# Patient Record
Sex: Female | Born: 1980
Health system: Southern US, Community
[De-identification: ages and names within clinical notes are randomized; demographics above are authoritative.]

## PROBLEM LIST (undated history)

## (undated) ENCOUNTER — Emergency Department (HOSPITAL_COMMUNITY): Admission: EM | Payer: 59 | Source: Home / Self Care

## (undated) DIAGNOSIS — I5189 Other ill-defined heart diseases: Secondary | ICD-10-CM

## (undated) DIAGNOSIS — I515 Myocardial degeneration: Secondary | ICD-10-CM

## (undated) DIAGNOSIS — I509 Heart failure, unspecified: Secondary | ICD-10-CM

## (undated) DIAGNOSIS — I2510 Atherosclerotic heart disease of native coronary artery without angina pectoris: Secondary | ICD-10-CM

## (undated) DIAGNOSIS — E039 Hypothyroidism, unspecified: Secondary | ICD-10-CM

## (undated) DIAGNOSIS — S5411XA Injury of median nerve at forearm level, right arm, initial encounter: Secondary | ICD-10-CM

## (undated) DIAGNOSIS — E785 Hyperlipidemia, unspecified: Secondary | ICD-10-CM

## (undated) DIAGNOSIS — E119 Type 2 diabetes mellitus without complications: Secondary | ICD-10-CM

## (undated) DIAGNOSIS — I071 Rheumatic tricuspid insufficiency: Secondary | ICD-10-CM

## (undated) DIAGNOSIS — I742 Embolism and thrombosis of arteries of the upper extremities: Secondary | ICD-10-CM

## (undated) DIAGNOSIS — I10 Essential (primary) hypertension: Secondary | ICD-10-CM

## (undated) DIAGNOSIS — I214 Non-ST elevation (NSTEMI) myocardial infarction: Secondary | ICD-10-CM

## (undated) DIAGNOSIS — E559 Vitamin D deficiency, unspecified: Secondary | ICD-10-CM

## (undated) DIAGNOSIS — T8859XA Other complications of anesthesia, initial encounter: Secondary | ICD-10-CM

## (undated) DIAGNOSIS — Z720 Tobacco use: Secondary | ICD-10-CM

## (undated) DIAGNOSIS — D649 Anemia, unspecified: Secondary | ICD-10-CM

## (undated) DIAGNOSIS — K219 Gastro-esophageal reflux disease without esophagitis: Secondary | ICD-10-CM

## (undated) DIAGNOSIS — E669 Obesity, unspecified: Secondary | ICD-10-CM

## (undated) HISTORY — DX: Non-ST elevation (NSTEMI) myocardial infarction: I21.4

## (undated) HISTORY — DX: Gastro-esophageal reflux disease without esophagitis: K21.9

## (undated) HISTORY — DX: Heart failure, unspecified: I50.9

## (undated) HISTORY — DX: Essential (primary) hypertension: I10

## (undated) HISTORY — DX: Vitamin D deficiency, unspecified: E55.9

## (undated) HISTORY — DX: Hypothyroidism, unspecified: E03.9

## (undated) HISTORY — DX: Hyperlipidemia, unspecified: E78.5

## (undated) HISTORY — PX: TUBAL LIGATION: SHX77

---

## 2015-05-26 DIAGNOSIS — I214 Non-ST elevation (NSTEMI) myocardial infarction: Secondary | ICD-10-CM

## 2015-05-26 HISTORY — DX: Non-ST elevation (NSTEMI) myocardial infarction: I21.4

## 2015-06-05 DIAGNOSIS — R5383 Other fatigue: Secondary | ICD-10-CM | POA: Diagnosis not present

## 2015-06-05 DIAGNOSIS — R609 Edema, unspecified: Secondary | ICD-10-CM | POA: Diagnosis not present

## 2015-06-05 DIAGNOSIS — E119 Type 2 diabetes mellitus without complications: Secondary | ICD-10-CM | POA: Diagnosis not present

## 2015-06-21 ENCOUNTER — Emergency Department (HOSPITAL_COMMUNITY): Payer: 59

## 2015-06-21 ENCOUNTER — Encounter (HOSPITAL_COMMUNITY)
Admission: EM | Disposition: A | Discharge status: Home / Self Care | Payer: Self-pay | Source: Home / Self Care | Attending: Interventional Cardiology

## 2015-06-21 ENCOUNTER — Observation Stay (HOSPITAL_BASED_OUTPATIENT_CLINIC_OR_DEPARTMENT_OTHER): Payer: 59

## 2015-06-21 ENCOUNTER — Encounter (HOSPITAL_COMMUNITY): Payer: Self-pay

## 2015-06-21 ENCOUNTER — Inpatient Hospital Stay (HOSPITAL_COMMUNITY)
Admission: EM | Admit: 2015-06-21 | Discharge: 2015-06-25 | DRG: 247 | Disposition: A | Discharge status: Home / Self Care | Payer: 59 | Attending: Interventional Cardiology | Admitting: Interventional Cardiology

## 2015-06-21 DIAGNOSIS — R7989 Other specified abnormal findings of blood chemistry: Secondary | ICD-10-CM | POA: Diagnosis not present

## 2015-06-21 DIAGNOSIS — I742 Embolism and thrombosis of arteries of the upper extremities: Secondary | ICD-10-CM | POA: Diagnosis not present

## 2015-06-21 DIAGNOSIS — F17200 Nicotine dependence, unspecified, uncomplicated: Secondary | ICD-10-CM | POA: Diagnosis present

## 2015-06-21 DIAGNOSIS — R9431 Abnormal electrocardiogram [ECG] [EKG]: Secondary | ICD-10-CM

## 2015-06-21 DIAGNOSIS — E669 Obesity, unspecified: Secondary | ICD-10-CM | POA: Diagnosis present

## 2015-06-21 DIAGNOSIS — I515 Myocardial degeneration: Secondary | ICD-10-CM

## 2015-06-21 DIAGNOSIS — I071 Rheumatic tricuspid insufficiency: Secondary | ICD-10-CM | POA: Diagnosis present

## 2015-06-21 DIAGNOSIS — I2102 ST elevation (STEMI) myocardial infarction involving left anterior descending coronary artery: Secondary | ICD-10-CM | POA: Diagnosis not present

## 2015-06-21 DIAGNOSIS — R946 Abnormal results of thyroid function studies: Secondary | ICD-10-CM | POA: Diagnosis present

## 2015-06-21 DIAGNOSIS — E119 Type 2 diabetes mellitus without complications: Secondary | ICD-10-CM

## 2015-06-21 DIAGNOSIS — Z955 Presence of coronary angioplasty implant and graft: Secondary | ICD-10-CM

## 2015-06-21 DIAGNOSIS — IMO0002 Reserved for concepts with insufficient information to code with codable children: Secondary | ICD-10-CM | POA: Diagnosis present

## 2015-06-21 DIAGNOSIS — S5412XA Injury of median nerve at forearm level, left arm, initial encounter: Secondary | ICD-10-CM | POA: Diagnosis not present

## 2015-06-21 DIAGNOSIS — Z6836 Body mass index (BMI) 36.0-36.9, adult: Secondary | ICD-10-CM

## 2015-06-21 DIAGNOSIS — I10 Essential (primary) hypertension: Secondary | ICD-10-CM | POA: Diagnosis present

## 2015-06-21 DIAGNOSIS — I255 Ischemic cardiomyopathy: Secondary | ICD-10-CM | POA: Diagnosis present

## 2015-06-21 DIAGNOSIS — I214 Non-ST elevation (NSTEMI) myocardial infarction: Secondary | ICD-10-CM | POA: Diagnosis not present

## 2015-06-21 DIAGNOSIS — E118 Type 2 diabetes mellitus with unspecified complications: Secondary | ICD-10-CM

## 2015-06-21 DIAGNOSIS — I361 Nonrheumatic tricuspid (valve) insufficiency: Secondary | ICD-10-CM | POA: Diagnosis present

## 2015-06-21 DIAGNOSIS — I5189 Other ill-defined heart diseases: Secondary | ICD-10-CM | POA: Diagnosis present

## 2015-06-21 DIAGNOSIS — Z7984 Long term (current) use of oral hypoglycemic drugs: Secondary | ICD-10-CM | POA: Diagnosis not present

## 2015-06-21 DIAGNOSIS — S5411XS Injury of median nerve at forearm level, right arm, sequela: Secondary | ICD-10-CM | POA: Diagnosis not present

## 2015-06-21 DIAGNOSIS — R0789 Other chest pain: Secondary | ICD-10-CM | POA: Diagnosis not present

## 2015-06-21 DIAGNOSIS — E1165 Type 2 diabetes mellitus with hyperglycemia: Secondary | ICD-10-CM | POA: Diagnosis present

## 2015-06-21 DIAGNOSIS — Z794 Long term (current) use of insulin: Secondary | ICD-10-CM

## 2015-06-21 DIAGNOSIS — I779 Disorder of arteries and arterioles, unspecified: Secondary | ICD-10-CM | POA: Diagnosis not present

## 2015-06-21 DIAGNOSIS — I25119 Atherosclerotic heart disease of native coronary artery with unspecified angina pectoris: Secondary | ICD-10-CM | POA: Diagnosis present

## 2015-06-21 DIAGNOSIS — Z720 Tobacco use: Secondary | ICD-10-CM | POA: Diagnosis present

## 2015-06-21 DIAGNOSIS — R079 Chest pain, unspecified: Secondary | ICD-10-CM | POA: Diagnosis not present

## 2015-06-21 DIAGNOSIS — I25118 Atherosclerotic heart disease of native coronary artery with other forms of angina pectoris: Secondary | ICD-10-CM | POA: Diagnosis not present

## 2015-06-21 DIAGNOSIS — R52 Pain, unspecified: Secondary | ICD-10-CM

## 2015-06-21 DIAGNOSIS — I2510 Atherosclerotic heart disease of native coronary artery without angina pectoris: Secondary | ICD-10-CM | POA: Diagnosis not present

## 2015-06-21 DIAGNOSIS — E785 Hyperlipidemia, unspecified: Secondary | ICD-10-CM | POA: Diagnosis present

## 2015-06-21 DIAGNOSIS — I209 Angina pectoris, unspecified: Secondary | ICD-10-CM | POA: Diagnosis not present

## 2015-06-21 DIAGNOSIS — X58XXXA Exposure to other specified factors, initial encounter: Secondary | ICD-10-CM | POA: Diagnosis not present

## 2015-06-21 DIAGNOSIS — R778 Other specified abnormalities of plasma proteins: Secondary | ICD-10-CM

## 2015-06-21 DIAGNOSIS — S5411XA Injury of median nerve at forearm level, right arm, initial encounter: Secondary | ICD-10-CM | POA: Diagnosis not present

## 2015-06-21 DIAGNOSIS — I252 Old myocardial infarction: Secondary | ICD-10-CM | POA: Diagnosis present

## 2015-06-21 HISTORY — DX: Atherosclerotic heart disease of native coronary artery without angina pectoris: I25.10

## 2015-06-21 HISTORY — DX: Other ill-defined heart diseases: I51.89

## 2015-06-21 HISTORY — DX: Rheumatic tricuspid insufficiency: I07.1

## 2015-06-21 HISTORY — DX: Tobacco use: Z72.0

## 2015-06-21 HISTORY — DX: Obesity, unspecified: E66.9

## 2015-06-21 HISTORY — DX: Injury of median nerve at forearm level, right arm, initial encounter: S54.11XA

## 2015-06-21 HISTORY — DX: Myocardial degeneration: I51.5

## 2015-06-21 HISTORY — PX: CARDIAC CATHETERIZATION: SHX172

## 2015-06-21 LAB — BASIC METABOLIC PANEL
ANION GAP: 14 (ref 5–15)
BUN: 13 mg/dL (ref 6–20)
CALCIUM: 9.5 mg/dL (ref 8.9–10.3)
CHLORIDE: 105 mmol/L (ref 101–111)
CO2: 20 mmol/L — AB (ref 22–32)
CREATININE: 1 mg/dL (ref 0.44–1.00)
GFR calc non Af Amer: >60 mL/min (ref >60)
Glucose, Bld: 178 mg/dL — ABNORMAL HIGH (ref 65–99)
Potassium: 3.9 mmol/L (ref 3.5–5.1)
SODIUM: 139 mmol/L (ref 135–145)

## 2015-06-21 LAB — CBC WITH DIFFERENTIAL/PLATELET
BASOS ABS: 0 10*3/uL (ref 0.0–0.1)
BASOS PCT: 0 %
EOS ABS: 0.1 10*3/uL (ref 0.0–0.7)
Eosinophils Relative: 1 %
HEMATOCRIT: 44.2 % (ref 36.0–46.0)
HEMOGLOBIN: 15.4 g/dL — AB (ref 12.0–15.0)
Lymphocytes Relative: 25 %
Lymphs Abs: 3.1 10*3/uL (ref 0.7–4.0)
MCH: 31 pg (ref 26.0–34.0)
MCHC: 34.8 g/dL (ref 30.0–36.0)
MCV: 89.1 fL (ref 78.0–100.0)
Monocytes Absolute: 0.5 10*3/uL (ref 0.1–1.0)
Monocytes Relative: 4 %
NEUTROS ABS: 8.7 10*3/uL — AB (ref 1.7–7.7)
NEUTROS PCT: 70 %
Platelets: 215 10*3/uL (ref 150–400)
RBC: 4.96 MIL/uL (ref 3.87–5.11)
RDW: 12.3 % (ref 11.5–15.5)
WBC: 12.4 10*3/uL — ABNORMAL HIGH (ref 4.0–10.5)

## 2015-06-21 LAB — D-DIMER, QUANTITATIVE (NOT AT ARMC): D DIMER QUANT: 0.61 ug{FEU}/mL — AB (ref 0.00–0.50)

## 2015-06-21 LAB — TROPONIN I
TROPONIN I: 0.06 ng/mL — AB (ref <0.031)
TROPONIN I: 0.4 ng/mL — AB (ref <0.031)
Troponin I: 6.94 ng/mL (ref <0.031)
Troponin I: 7.82 ng/mL (ref <0.031)

## 2015-06-21 LAB — PROTIME-INR
INR: 1.17 (ref 0.00–1.49)
Prothrombin Time: 15.1 seconds (ref 11.6–15.2)

## 2015-06-21 LAB — TSH: TSH: 0.184 u[IU]/mL — AB (ref 0.350–4.500)

## 2015-06-21 LAB — GLUCOSE, CAPILLARY
GLUCOSE-CAPILLARY: 175 mg/dL — AB (ref 65–99)
GLUCOSE-CAPILLARY: 174 mg/dL — AB (ref 65–99)
Glucose-Capillary: 216 mg/dL — ABNORMAL HIGH (ref 65–99)
Glucose-Capillary: 99 mg/dL (ref 65–99)

## 2015-06-21 LAB — APTT: aPTT: 92 seconds — ABNORMAL HIGH (ref 24–37)

## 2015-06-21 LAB — POCT ACTIVATED CLOTTING TIME: Activated Clotting Time: 693 seconds

## 2015-06-21 LAB — ECHOCARDIOGRAM COMPLETE
Height: 69 in
Weight: 3832 oz

## 2015-06-21 LAB — HCG, QUANTITATIVE, PREGNANCY

## 2015-06-21 SURGERY — LEFT HEART CATH AND CORONARY ANGIOGRAPHY
Anesthesia: LOCAL

## 2015-06-21 MED ORDER — IOPAMIDOL (ISOVUE-370) INJECTION 76%
INTRAVENOUS | Status: DC | PRN
  Administered 2015-06-21: 250 mL via INTRA_ARTERIAL

## 2015-06-21 MED ORDER — INSULIN ASPART 100 UNIT/ML ~~LOC~~ SOLN
0.0000 [IU] | Freq: Three times a day (TID) | SUBCUTANEOUS | Status: DC
  Administered 2015-06-21 – 2015-06-22 (×2): 3 [IU] via SUBCUTANEOUS
  Administered 2015-06-22 – 2015-06-23 (×2): 2 [IU] via SUBCUTANEOUS
  Administered 2015-06-23: 3 [IU] via SUBCUTANEOUS
  Administered 2015-06-24: 2 [IU] via SUBCUTANEOUS
  Administered 2015-06-24: 5 [IU] via SUBCUTANEOUS
  Administered 2015-06-25: 2 [IU] via SUBCUTANEOUS
  Administered 2015-06-25: 3 [IU] via SUBCUTANEOUS
  Administered 2015-06-25: 2 [IU] via SUBCUTANEOUS

## 2015-06-21 MED ORDER — ONDANSETRON HCL 4 MG/2ML IJ SOLN
4.0000 mg | Freq: Four times a day (QID) | INTRAMUSCULAR | Status: DC | PRN
  Filled 2015-06-21: qty 2

## 2015-06-21 MED ORDER — IOPAMIDOL (ISOVUE-370) INJECTION 76%
INTRAVENOUS | Status: AC
  Filled 2015-06-21: qty 100

## 2015-06-21 MED ORDER — SODIUM CHLORIDE 0.9 % IV SOLN: 250.0000 mL | INTRAVENOUS | Status: DC | PRN

## 2015-06-21 MED ORDER — BIVALIRUDIN 250 MG IV SOLR
INTRAVENOUS | Status: AC
  Filled 2015-06-21: qty 250

## 2015-06-21 MED ORDER — NITROGLYCERIN 1 MG/10 ML FOR IR/CATH LAB
INTRA_ARTERIAL | Status: AC
  Filled 2015-06-21: qty 10

## 2015-06-21 MED ORDER — MIDAZOLAM HCL 2 MG/2ML IJ SOLN
INTRAMUSCULAR | Status: AC
  Filled 2015-06-21: qty 2

## 2015-06-21 MED ORDER — HEPARIN (PORCINE) IN NACL 100-0.45 UNIT/ML-% IJ SOLN
1000.0000 [IU]/h | INTRAMUSCULAR | Status: DC
  Administered 2015-06-21: 1000 [IU]/h via INTRAVENOUS
  Filled 2015-06-21: qty 250

## 2015-06-21 MED ORDER — ENOXAPARIN SODIUM 40 MG/0.4ML ~~LOC~~ SOLN
40.0000 mg | SUBCUTANEOUS | Status: DC
  Administered 2015-06-21: 40 mg via SUBCUTANEOUS
  Filled 2015-06-21: qty 0.4

## 2015-06-21 MED ORDER — ASPIRIN 325 MG PO TABS
325.0000 mg | ORAL_TABLET | Freq: Once | ORAL | Status: AC
  Administered 2015-06-21: 325 mg via ORAL
  Filled 2015-06-21: qty 1

## 2015-06-21 MED ORDER — KETOROLAC TROMETHAMINE 60 MG/2ML IM SOLN
30.0000 mg | Freq: Once | INTRAMUSCULAR | Status: DC
  Filled 2015-06-21: qty 2

## 2015-06-21 MED ORDER — VERAPAMIL HCL 2.5 MG/ML IV SOLN
INTRAVENOUS | Status: DC | PRN
  Administered 2015-06-21: 10 mL via INTRA_ARTERIAL

## 2015-06-21 MED ORDER — ATORVASTATIN CALCIUM 80 MG PO TABS
80.0000 mg | ORAL_TABLET | Freq: Every day | ORAL | Status: DC
  Administered 2015-06-21 – 2015-06-22 (×2): 80 mg via ORAL
  Filled 2015-06-21 (×2): qty 1

## 2015-06-21 MED ORDER — HEART ATTACK BOUNCING BOOK
Freq: Once | Status: AC
Start: 2015-06-21 — End: 2015-06-21
  Administered 2015-06-21: 22:00:00
  Filled 2015-06-21: qty 1

## 2015-06-21 MED ORDER — LIDOCAINE HCL (PF) 1 % IJ SOLN
INTRAMUSCULAR | Status: AC
  Filled 2015-06-21: qty 30

## 2015-06-21 MED ORDER — FENTANYL CITRATE (PF) 100 MCG/2ML IJ SOLN
INTRAMUSCULAR | Status: AC
  Filled 2015-06-21: qty 2

## 2015-06-21 MED ORDER — KETOROLAC TROMETHAMINE 30 MG/ML IJ SOLN
15.0000 mg | Freq: Once | INTRAMUSCULAR | Status: AC
  Administered 2015-06-21: 15 mg via INTRAVENOUS
  Filled 2015-06-21: qty 1

## 2015-06-21 MED ORDER — MIDAZOLAM HCL 2 MG/2ML IJ SOLN
INTRAMUSCULAR | Status: DC | PRN
Start: 2015-06-21 — End: 2015-06-21
  Administered 2015-06-21: 1 mg via INTRAVENOUS

## 2015-06-21 MED ORDER — HEPARIN (PORCINE) IN NACL 2-0.9 UNIT/ML-% IJ SOLN
INTRAMUSCULAR | Status: DC | PRN
Start: 2015-06-21 — End: 2015-06-21
  Administered 2015-06-21: 18:00:00

## 2015-06-21 MED ORDER — ONDANSETRON HCL 4 MG/2ML IJ SOLN
4.0000 mg | Freq: Four times a day (QID) | INTRAMUSCULAR | Status: DC | PRN
  Administered 2015-06-22: 4 mg via INTRAVENOUS
  Filled 2015-06-21: qty 2

## 2015-06-21 MED ORDER — HEPARIN (PORCINE) IN NACL 2-0.9 UNIT/ML-% IJ SOLN
INTRAMUSCULAR | Status: AC
  Filled 2015-06-21: qty 1000

## 2015-06-21 MED ORDER — TICAGRELOR 90 MG PO TABS
90.0000 mg | ORAL_TABLET | Freq: Two times a day (BID) | ORAL | Status: DC
  Administered 2015-06-22 – 2015-06-25 (×8): 90 mg via ORAL
  Filled 2015-06-21 (×9): qty 1

## 2015-06-21 MED ORDER — NITROGLYCERIN 2 % TD OINT
1.0000 [in_us] | TOPICAL_OINTMENT | Freq: Once | TRANSDERMAL | Status: AC
  Administered 2015-06-21: 1 [in_us] via TOPICAL
  Filled 2015-06-21: qty 1

## 2015-06-21 MED ORDER — HEPARIN SODIUM (PORCINE) 1000 UNIT/ML IJ SOLN
INTRAMUSCULAR | Status: DC | PRN
  Administered 2015-06-21: 5000 [IU] via INTRAVENOUS

## 2015-06-21 MED ORDER — IOPAMIDOL (ISOVUE-370) INJECTION 76%
INTRAVENOUS | Status: AC
  Filled 2015-06-21: qty 50

## 2015-06-21 MED ORDER — NITROGLYCERIN 1 MG/10 ML FOR IR/CATH LAB
INTRA_ARTERIAL | Status: DC | PRN
  Administered 2015-06-21 (×2): 200 ug via INTRA_ARTERIAL

## 2015-06-21 MED ORDER — FENTANYL CITRATE (PF) 100 MCG/2ML IJ SOLN
INTRAMUSCULAR | Status: DC | PRN
  Administered 2015-06-21 (×2): 50 ug via INTRAVENOUS

## 2015-06-21 MED ORDER — METOPROLOL TARTRATE 12.5 MG HALF TABLET
12.5000 mg | ORAL_TABLET | Freq: Two times a day (BID) | ORAL | Status: DC
  Administered 2015-06-21: 12.5 mg via ORAL
  Filled 2015-06-21: qty 1

## 2015-06-21 MED ORDER — ASPIRIN EC 325 MG PO TBEC
325.0000 mg | DELAYED_RELEASE_TABLET | Freq: Every day | ORAL | Status: DC
  Administered 2015-06-21: 325 mg via ORAL
  Filled 2015-06-21: qty 1

## 2015-06-21 MED ORDER — ACETAMINOPHEN 325 MG PO TABS
650.0000 mg | ORAL_TABLET | ORAL | Status: DC | PRN
  Administered 2015-06-21 – 2015-06-25 (×7): 650 mg via ORAL
  Filled 2015-06-21 (×7): qty 2

## 2015-06-21 MED ORDER — ATORVASTATIN CALCIUM 40 MG PO TABS
40.0000 mg | ORAL_TABLET | Freq: Every day | ORAL | Status: DC
  Administered 2015-06-21: 40 mg via ORAL
  Filled 2015-06-21: qty 1

## 2015-06-21 MED ORDER — SODIUM CHLORIDE 0.9% FLUSH
3.0000 mL | Freq: Two times a day (BID) | INTRAVENOUS | Status: DC
  Administered 2015-06-22: 23:00:00 3 mL via INTRAVENOUS

## 2015-06-21 MED ORDER — OXYCODONE-ACETAMINOPHEN 5-325 MG PO TABS: 1.0000 | ORAL_TABLET | ORAL | Status: DC | PRN

## 2015-06-21 MED ORDER — NITROGLYCERIN 0.4 MG SL SUBL
SUBLINGUAL_TABLET | SUBLINGUAL | Status: AC
  Administered 2015-06-21: 0.4 mg
  Filled 2015-06-21: qty 1

## 2015-06-21 MED ORDER — SODIUM CHLORIDE 0.9% FLUSH: 3.0000 mL | INTRAVENOUS | Status: DC | PRN

## 2015-06-21 MED ORDER — IOPAMIDOL (ISOVUE-370) INJECTION 76%
100.0000 mL | Freq: Once | INTRAVENOUS | Status: AC | PRN
  Administered 2015-06-21: 100 mL via INTRAVENOUS

## 2015-06-21 MED ORDER — SODIUM CHLORIDE 0.9 % IV SOLN
250.0000 mg | INTRAVENOUS | Status: DC | PRN
  Administered 2015-06-21 (×2): 1.75 mg/kg/h via INTRAVENOUS

## 2015-06-21 MED ORDER — ONDANSETRON HCL 4 MG PO TABS: 4.0000 mg | ORAL_TABLET | Freq: Four times a day (QID) | ORAL | Status: DC | PRN

## 2015-06-21 MED ORDER — HEPARIN BOLUS VIA INFUSION
4000.0000 [IU] | Freq: Once | INTRAVENOUS | Status: AC
  Administered 2015-06-21: 4000 [IU] via INTRAVENOUS
  Filled 2015-06-21: qty 4000

## 2015-06-21 MED ORDER — INSULIN ASPART 100 UNIT/ML ~~LOC~~ SOLN: 0.0000 [IU] | Freq: Every day | SUBCUTANEOUS | Status: DC

## 2015-06-21 MED ORDER — CARVEDILOL 3.125 MG PO TABS
3.1250 mg | ORAL_TABLET | Freq: Two times a day (BID) | ORAL | Status: DC
  Administered 2015-06-21 – 2015-06-22 (×3): 3.125 mg via ORAL
  Filled 2015-06-21 (×4): qty 1

## 2015-06-21 MED ORDER — SODIUM CHLORIDE 0.9% FLUSH: 3.0000 mL | Freq: Two times a day (BID) | INTRAVENOUS | Status: DC

## 2015-06-21 MED ORDER — SODIUM CHLORIDE 0.9 % WEIGHT BASED INFUSION
1.0000 mL/kg/h | INTRAVENOUS | Status: AC
  Administered 2015-06-21: 1 mL/kg/h via INTRAVENOUS

## 2015-06-21 MED ORDER — SODIUM CHLORIDE 0.9 % IV SOLN
INTRAVENOUS | Status: DC
  Administered 2015-06-21 – 2015-06-23 (×2): via INTRAVENOUS

## 2015-06-21 MED ORDER — LIDOCAINE HCL (PF) 1 % IJ SOLN
INTRAMUSCULAR | Status: DC | PRN
  Administered 2015-06-21: 2 mL via INTRADERMAL

## 2015-06-21 MED ORDER — MORPHINE SULFATE (PF) 2 MG/ML IV SOLN
2.0000 mg | INTRAVENOUS | Status: DC | PRN
  Administered 2015-06-22: 2 mg via INTRAVENOUS
  Filled 2015-06-21: qty 1

## 2015-06-21 MED ORDER — BIVALIRUDIN BOLUS VIA INFUSION - CUPID
INTRAVENOUS | Status: DC | PRN
  Administered 2015-06-21: 81.45 mg via INTRAVENOUS

## 2015-06-21 MED ORDER — ASPIRIN EC 81 MG PO TBEC
81.0000 mg | DELAYED_RELEASE_TABLET | Freq: Every day | ORAL | Status: DC
  Administered 2015-06-22 – 2015-06-25 (×4): 81 mg via ORAL
  Filled 2015-06-21 (×4): qty 1

## 2015-06-21 MED ORDER — ANGIOPLASTY BOOK
Freq: Once | Status: AC
Start: 2015-06-21 — End: 2015-06-21
  Administered 2015-06-21: 22:00:00
  Filled 2015-06-21: qty 1

## 2015-06-21 MED ORDER — LISINOPRIL 5 MG PO TABS
2.5000 mg | ORAL_TABLET | Freq: Every day | ORAL | Status: DC
Start: 2015-06-22 — End: 2015-06-23
  Administered 2015-06-22: 2.5 mg via ORAL
  Filled 2015-06-21 (×2): qty 1

## 2015-06-21 MED ORDER — VERAPAMIL HCL 2.5 MG/ML IV SOLN
INTRAVENOUS | Status: AC
  Filled 2015-06-21: qty 2

## 2015-06-21 MED ORDER — MORPHINE SULFATE (PF) 4 MG/ML IV SOLN: 4.0000 mg | Freq: Once | INTRAVENOUS | Status: DC

## 2015-06-21 MED ORDER — HEPARIN SODIUM (PORCINE) 1000 UNIT/ML IJ SOLN
INTRAMUSCULAR | Status: AC
  Filled 2015-06-21: qty 1

## 2015-06-21 MED ORDER — KETOROLAC TROMETHAMINE 30 MG/ML IJ SOLN
30.0000 mg | Freq: Four times a day (QID) | INTRAMUSCULAR | Status: DC | PRN
  Administered 2015-06-23: 30 mg via INTRAVENOUS
  Filled 2015-06-21 (×3): qty 1

## 2015-06-21 MED ORDER — TICAGRELOR 90 MG PO TABS
ORAL_TABLET | ORAL | Status: DC | PRN
  Administered 2015-06-21: 180 mg via ORAL

## 2015-06-21 SURGICAL SUPPLY — 23 items
BALLN ~~LOC~~ EUPHORA RX 3.25X15 (BALLOONS) ×2
BALLN ~~LOC~~ EUPHORA RX 3.75X20 (BALLOONS) ×2
BALLOON ~~LOC~~ EUPHORA RX 3.25X15 (BALLOONS) ×1 IMPLANT
BALLOON ~~LOC~~ EUPHORA RX 3.75X20 (BALLOONS) ×1 IMPLANT
CATH EXTRAC PRONTO 5.5F 138CM (CATHETERS) ×2 IMPLANT
CATH INFINITI 5 FR JL3.5 (CATHETERS) ×2 IMPLANT
CATH INFINITI JR4 5F (CATHETERS) ×2 IMPLANT
CATH OPTICROSS 40MHZ (CATHETERS) ×2 IMPLANT
CATH VISTA GUIDE 6FR XBLAD3.5 (CATHETERS) ×2 IMPLANT
DEVICE RAD COMP TR BAND LRG (VASCULAR PRODUCTS) ×2 IMPLANT
GLIDESHEATH SLEND A-KIT 6F 22G (SHEATH) ×2 IMPLANT
KIT ENCORE 26 ADVANTAGE (KITS) ×2 IMPLANT
KIT HEART LEFT (KITS) ×2 IMPLANT
PACK CARDIAC CATHETERIZATION (CUSTOM PROCEDURE TRAY) ×2 IMPLANT
SLED PULL BACK IVUS (MISCELLANEOUS) ×2 IMPLANT
STENT PROMUS PREM MR 3.0X20 (Permanent Stent) ×2 IMPLANT
STENT PROMUS PREM MR 3.5X32 (Permanent Stent) ×2 IMPLANT
TRANSDUCER W/STOPCOCK (MISCELLANEOUS) ×2 IMPLANT
TUBING CIL FLEX 10 FLL-RA (TUBING) ×2 IMPLANT
VALVE GUARDIAN II ~~LOC~~ HEMO (MISCELLANEOUS) ×4 IMPLANT
WIRE HI TORQ VERSACORE J 260CM (WIRE) ×2 IMPLANT
WIRE RUNTHROUGH .014X180CM (WIRE) ×2 IMPLANT
WIRE SAFE-T 1.5MM-J .035X260CM (WIRE) ×2 IMPLANT

## 2015-06-21 NOTE — Progress Notes (Signed)
CRITICAL VALUE ALERT  Critical value received:  Troponin 6.94  Date of notification:  06/21/2015  Time of notification:  11:38  Critical value read back:Yes.    Nurse who received alert:  Radene Gunning  MD notified (1st page):  Dr Caryn Section  Time of first page:  11:38  MD notified (2nd page):  Time of second page:  Responding MD:  Dr Caryn Section  Time MD responded:  11:39

## 2015-06-21 NOTE — ED Notes (Signed)
MD at bedside. 

## 2015-06-21 NOTE — Progress Notes (Addendum)
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 Marin Comment for a complaint of chest pain while at work. She was briefly seen and examined. Her chart, vital signs, and laboratory studies were reviewed. Agree with management started by Dr. Marin Comment with additions below.  -Patient's troponin I increased from 0.06-0.40. Cardiology was then consulted. Subtle ST elevations were noted in most of the leads on the follow-up EKG. In the interim, her troponin I increased to 6.94. Cardiologist, Dr. Jacinta Shoe reviewed the echo as it was being performed and noted that there was market apical hypokinesis. -The plan is to start heparin, metoprolol, and Lipitor, and transfer her to [facility] for coronary angiography to rule out potential LAD lesion. -Patient's TSH is low, so we'll order a free T4. -Metformin is being held for treatment of her diabetes; will continue sliding scale NovoLog. Will order hemoglobin A1c. -We'll order tobacco cessation counseling.

## 2015-06-21 NOTE — Progress Notes (Signed)
CRITICAL VALUE ALERT  Critical value received:  Troponin 7.89  Date of notification:  06/18/15  Time of notification:  1950  Critical value read back:Yes.    Nurse who received alert:  Helayne Seminole, RN  MD notified (1st page):  Alejandro Mulling  Time of first page:  2000  MD notified (2nd page): None  Time of second page: None  Responding MD:  Alejandro Mulling  Time MD responded:  2020

## 2015-06-21 NOTE — Care Management Obs Status (Signed)
[place] NOTIFICATION   Patient Details  Name: Jennifer Cervantes MRN: [id number] Date of Birth: 12/05/1980   Medicare Observation Status Notification Given:  Yes    Alvie Heidelberg, RN 06/21/2015, 12:57 PM

## 2015-06-21 NOTE — ED Notes (Signed)
Pt states she was working upstairs when she had onset of sharp and dull pain to the center of her chest with pain to her right arm.  Pt states she got sweaty as well.

## 2015-06-21 NOTE — Progress Notes (Signed)
ANTICOAGULATION CONSULT NOTE - Initial Consult  Pharmacy Consult for HEPARIN Indication: chest pain/ACS  No Known Allergies  Patient Measurements: Height: 5\' 9"  (175.3 cm) Weight: 239 lb 8 oz (108.636 kg) IBW/kg (Calculated) : 66.2 HEPARIN DW (KG): 90.5  Vital Signs: Temp: 98 F (36.7 C) (04/27 0540) Temp Source: Oral (04/27 0540) BP: 113/65 mmHg (04/27 0540) Pulse Rate: 96 (04/27 0540)  Labs:  Recent Labs  06/21/15 0149 06/21/15 0225 06/21/15 0532 06/21/15 1052  HGB 15.4*  --   --   --   HCT 44.2  --   --   --   PLT 215  --   --   --   CREATININE  --  1.00  --   --   TROPONINI  --  0.06* 0.40* 6.94*   Estimated Creatinine Clearance: 102.2 mL/min (by C-G formula based on Cr of 1).  Medical History: Past Medical History  Diagnosis Date  . Diabetes mellitus without complication ([place])    Medications:  Prescriptions prior to admission  Medication Sig Dispense Refill Last Dose  . Cholecalciferol (VITAMIN D3) 50000 units CAPS Take 1 capsule by mouth once a week.   Past Week at Unknown time  . metFORMIN (GLUCOPHAGE) 1000 MG tablet Take 1,000 mg by mouth 2 (two) times daily with a meal.   06/20/2015 at Unknown time    Assessment: 35yo obese female with elevated troponin.  Pt with c/o chest pain.  Asked to initiate IV Heparin for ACS.  Goal of Therapy:  Heparin level 0.3-0.7 units/ml Monitor platelets by anticoagulation protocol: Yes   Plan:  Heparin 4000 units bolus now x 1 Heparin infusion at 1000 units/hr Heparin level in 6-8 hrs then daily CBC daily while on Heparin  [place] Crane, Alexus Galka A 06/21/2015,11:46 AM

## 2015-06-21 NOTE — Interval H&P Note (Signed)
Cath Lab Visit (complete for each Cath Lab visit)  Clinical Evaluation Leading to the Procedure:   ACS: Yes.    Non-ACS:    Anginal Classification: CCS IV  Anti-ischemic medical therapy: Minimal Therapy (1 class of medications)  Non-Invasive Test Results: No non-invasive testing performed  Prior CABG: No previous CABG      History and Physical Interval Note:  06/21/2015 3:54 PM  Jennifer Cervantes  has presented today for surgery, with the diagnosis of Nstemi  The various methods of treatment have been discussed with the patient and family. After consideration of risks, benefits and other options for treatment, the patient has consented to  Procedure(s): Left Heart Cath and Coronary Angiography (N/A) as a surgical intervention .  The patient's history has been reviewed, patient examined, no change in status, stable for surgery.  I have reviewed the patient's chart and labs.  Questions were answered to the patient's satisfaction.     Sinclair Grooms

## 2015-06-21 NOTE — Consult Note (Signed)
CARDIOLOGY CONSULT NOTE   Patient ID: Jennifer Cervantes MRN: [id number] DOB/AGE: [birth year] 35 y.o.  Admit date: 06/21/2015  Primary Physician   Jeffie Pollock, MD Primary Cardiologist   New Reason for Consultation   Chest pain  [id number] Jennifer Cervantes is a 35 y.o. year old female with a history of  DM, obesity, tobacco use. She was admitted 04/27 with chest pain, troponin is climbing and cardiology was asked to evaluate her.   About 9:00 last night, while she was at work, she had sudden onset of substernal/left chest pain that she describes as both sharp and throbbing. It reached a 7/10. It started without exertion. It went into her right arm with numbness. It does not change with deep inspiration.  When her symptoms did not resolve, she went to the emergency room. In the emergency room, she had nitroglycerin paste applied, with a reduction in her pain to a 3 or 4/10. She also got Toradol and aspirin. Her pain has stayed at a 3-4/10 all night long, and she now has a headache. Her chest pain does not get worse with deep inspiration or supine position. It will get worse if she stands up and she will feel a bit lightheaded.  Jennifer Cervantes has never had chest pain before. She has had no recent illnesses or problems. no cough, fever or chills. She feels that she stays well hydrated. She admits that she eats a lot of junk food.      Past Medical History  Diagnosis Date  . Diabetes mellitus without complication ([place])      History reviewed. No pertinent past surgical history.  No Known Allergies  I have reviewed the patient's current medications . aspirin EC  325 mg Oral Daily  . enoxaparin (LOVENOX) injection  40 mg Subcutaneous Q24H  . insulin aspart  0-15 Units Subcutaneous TID WC  . insulin aspart  0-5 Units Subcutaneous QHS  . sodium chloride flush  3 mL Intravenous Q12H   . sodium chloride 150 mL/hr at 06/21/15 0554   ketorolac, ondansetron **OR** ondansetron (ZOFRAN)  IV  Prior to Admission medications   Medication Sig Start Date End Date Taking? Authorizing Provider  metFORMIN (GLUCOPHAGE) 1000 MG tablet Take 1,000 mg by mouth 2 (two) times daily with a meal.   Yes Historical Provider, MD     Social History   Social History  . Marital Status: Single    Spouse Name: N/A  . Number of Children: N/A  . Years of Education: N/A   Occupational History  . Nurse [employer]   Social History Main Topics  . Smoking status: Current Every Day Smoker -- 0.50 packs/day for 23 years  . Smokeless tobacco: Never Used  . Alcohol Use: No  . Drug Use: No  . Sexual Activity: Not on file   Other Topics Concern  . Not on file   Social History Narrative   Lives in [place], works nights at [employer] Status  Relation Status Death Age  . Mother Alive   . Father Alive    Family History  Problem Relation Age of Onset  . Heart attack Neg Hx   . Heart failure Neg Hx      ROS:  Full 14 point review of systems complete and found to be negative unless listed above.  Physical Exam: Blood pressure 113/65, pulse 96, temperature 98 F (36.7 Jennifer), temperature source Oral, resp. rate 18, height 5\' 9"  (1.753 m), weight  239 lb 8 oz (108.636 kg), last menstrual period 05/26/2015, SpO2 98 %.  General: Well developed, well nourished, female in no acute distress Head: Eyes PERRLA, No xanthomas.   Normocephalic and atraumatic, oropharynx without edema or exudate. Dentition:  Good Lungs:  Clear bilaterally; chest wall is diffusely tender to palpation over the sternum and to the left of the sternum. Heart: HRRR S1 S2, no rub/gallop,  no murmur. pulses are 2+ all 4 extrem.   Neck: No carotid bruits. No lymphadenopathy.  JVD not elevated . Abdomen: Bowel sounds present, abdomen soft and non-tender without masses or hernias noted. Msk:  No spine or cva tenderness. No weakness, no joint deformities or effusions. Extremities: No clubbing or cyanosis. No edema.  Neuro:  Alert and oriented X 3. No focal deficits noted. Psych:  Good affect, responds appropriately Skin: No rashes or lesions noted.  Labs:   Lab Results  Component Value Date   WBC 12.4* 06/21/2015   HGB 15.4* 06/21/2015   HCT 44.2 06/21/2015   MCV 89.1 06/21/2015   PLT 215 06/21/2015     Recent Labs Lab 06/21/15 0225  NA 139  K 3.9  CL 105  CO2 20*  BUN 13  CREATININE 1.00  CALCIUM 9.5  GLUCOSE 178*    Recent Labs  06/21/15 0225 06/21/15 0532  TROPONINI 0.06* 0.40*   Lab Results  Component Value Date   DDIMER 0.61* 06/21/2015   TSH  Date/Time Value Ref Range Status  06/21/2015 05:32 AM 0.184* 0.350 - 4.500 uIU/mL Final   Echo: ordered  ECG:  06/21/2015 Sinus rhythm, less than 1 mm of ST elevation in multiple leads  Radiology:  Dg Chest 2 View 06/21/2015  CLINICAL DATA:  Acute onset of central chest pain. Initial encounter. EXAM: CHEST  2 VIEW COMPARISON:  CTA of the chest performed earlier today at 3:19 a.m. FINDINGS: The lungs are well-aerated and clear. There is no evidence of focal opacification, pleural effusion or pneumothorax. The heart is normal in size; the mediastinal contour is within normal limits. No acute osseous abnormalities are seen. IMPRESSION: No acute cardiopulmonary process seen. Electronically Signed   By: Garald Balding M.D.   On: 06/21/2015 03:50   Ct Angio Chest Pe W/cm &/or Wo Cm 06/21/2015  CLINICAL DATA:  Acute onset constant chest pressure for 4 hours. Nausea and vomiting, diaphoresis. History of diabetes. EXAM: CT ANGIOGRAPHY CHEST WITH CONTRAST TECHNIQUE: Multidetector CT imaging of the chest was performed using the standard protocol during bolus administration of intravenous contrast. Multiplanar CT image reconstructions and MIPs were obtained to evaluate the vascular anatomy. CONTRAST:  100 cc Isovue 370 COMPARISON:  None. FINDINGS: PULMONARY ARTERY: Adequate contrast opacification of the pulmonary artery's. Main pulmonary artery is not  enlarged. No pulmonary arterial filling defects to the level of the subsegmental branches. MEDIASTINUM: Heart and pericardium are unremarkable, no right heart strain. Thoracic aorta is normal course and caliber, unremarkable. No lymphadenopathy by CT size criteria. LUNGS: Tracheobronchial tree is patent, no pneumothorax. No pleural effusions, focal consolidations, pulmonary nodules or masses. SOFT TISSUES AND OSSEOUS STRUCTURES: Hypodense liver compatible with steatosis. Thyromegaly partially imaged with substernal extent. Osseous structures are unremarkable. Review of the MIP images confirms the above findings. IMPRESSION: No acute pulmonary embolism or acute cardiopulmonary process. Electronically Signed   By: Elon Alas M.D.   On: 06/21/2015 03:39    ASSESSMENT AND PLAN:   The patient was seen today by Dr Bronson Ing, the patient evaluated and the data reviewed.  Principal  Problem:   Chest pain, atypical - Symptoms were nonexertional and she has never had exertional chest pain - However, symptoms improved with nitroglycerin and her troponin is mildly elevated - ECG is not a STEMI, but there appears to be mild ST elevation in multiple leads - Make sure echo was done early - Check CK-MB and sedimentation rate - If echo is abnormal, or troponin continues to climb, will need cath  Otherwise, per IM  Active Problems:   DM (diabetes mellitus) ([place])   Tobacco abuse   Chest pain   Signed: Lenoard Aden 06/21/2015 9:43 AM Jennifer Cervantes [id number]  Co-Sign MD  The patient was seen and examined, and I agree with the history, physical exam, assessment and plan as documented above which has been discussed with R. Barrett PA-Jennifer, with modifications as noted below. Pt continues to have chest pain. Most recent troponin 0.4. ECG with diffuse nonspecific ST elevations. Quick preliminary look at apical 4-chamber view shows marked apical hypokinesis. Will transfer to [facility] for coronary  angiography to rule out potential LAD lesion. Will start heparin, metoprolol, and Lipitor.  Kate Sable, MD, [facility]  06/21/2015 11:31 AM

## 2015-06-21 NOTE — H&P (Signed)
History and Physical    Jennifer Cervantes [id number] DOB: May 02, 1980 DOA: 06/21/2015  Referring MD/NP/PA: Dr Lisabeth Pick PCP: Jeffie Pollock, MD   Chief Complaint: chest pain.  HPI: EARNEST ARMFIELD is a 35 y.o. 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   She also had some diaphoresis, and nausea, but no fever, chills, coughs, black or bloody stool.  She reportedly had lost a grandmother whom she is close.  Evaluation in the ER included an EKG with PR depression, but no ST elevation, elevated D Dimmer with negative CTPA, and troponin of 0.06.  Her CXR was clear.  She has a CBG of 178, and normal Cr.  Her BP was 102/70.  Given her risk factors, hospitalist was asked to admit her for cardiac r/out.  Of note, she was off her metformin for a year, and her HA1C was 10 about 2 weeks ago, and she was restarted on Metformin.   Review of Systems: As per HPI otherwise 10 point review of systems negative.   Past Medical History  Diagnosis Date  . Diabetes mellitus without complication ([place])     History reviewed. No pertinent past surgical history.   reports that she has been smoking.  She does not have any smokeless tobacco history on file. She reports that she does not drink alcohol. Her drug history is not on file.  No Known Allergies  History reviewed. No pertinent family history.  Prior to Admission medications   Medication Sig Start Date End Date Taking? Authorizing Provider  metFORMIN (GLUCOPHAGE) 1000 MG tablet Take 1,000 mg by mouth 2 (two) times daily with a meal.   Yes Historical Provider, MD    Physical Exam: Filed Vitals:   06/21/15 0200 06/21/15 0230 06/21/15 0300 06/21/15 0330  BP: 158/95 118/82 116/96 138/95  Pulse: 97 97 91 103  Temp:      TempSrc:      Resp: 16 26 11 16   Height:      Weight:        SpO2: 99% 98% 100% 100%      Constitutional: NAD, calm, comfortable Filed Vitals:   06/21/15 0200 06/21/15 0230 06/21/15 0300 06/21/15 0330  BP: 158/95 118/82 116/96 138/95  Pulse: 97 97 91 103  Temp:      TempSrc:      Resp: 16 26 11 16   Height:      Weight:      SpO2: 99% 98% 100% 100%   Eyes: PERRL, lids and conjunctivae normal ENMT: Mucous membranes are moist. Posterior pharynx clear of any exudate or lesions.Normal dentition.  Neck: normal, supple, no masses, no thyromegaly Respiratory: clear to auscultation bilaterally, no wheezing, no crackles. Normal respiratory effort. No accessory muscle use.  Cardiovascular: Regular rate and rhythm, no murmurs / rubs / gallops. No extremity edema. 2+ pedal pulses. No carotid bruits.  Abdomen: no tenderness, no masses palpated. No hepatosplenomegaly. Bowel sounds positive.  Musculoskeletal: no clubbing / cyanosis. No joint deformity upper and lower extremities. Good ROM, no contractures. Normal muscle tone.  Skin: no rashes, lesions, ulcers. No induration Neurologic: CN 2-12 grossly intact. Sensation intact, DTR normal. Strength 5/5 in all 4.  Psychiatric: Normal judgment and insight. Alert and oriented x 3. Normal mood.    Labs on Admission: I have personally reviewed following labs and imaging studies  CBC:  Recent Labs Lab 06/21/15 0149  WBC 12.4*  NEUTROABS 8.7*  HGB 15.4*  HCT 44.2  MCV 89.1  PLT 123456   Basic Metabolic Panel:  Recent Labs Lab 06/21/15 0225  NA 139  K 3.9  CL 105  CO2 20*  GLUCOSE 178*  BUN 13  CREATININE 1.00  CALCIUM 9.5   Cardiac Enzymes:  Recent Labs Lab 06/21/15 0225  TROPONINI 0.06*    Radiological Exams on Admission: Dg Chest 2 View  06/21/2015  CLINICAL DATA:  Acute onset of central chest pain. Initial encounter. EXAM: CHEST  2 VIEW COMPARISON:  CTA of the chest performed earlier today at 3:19 a.m. FINDINGS: The lungs are well-aerated and clear. There is no evidence of focal  opacification, pleural effusion or pneumothorax. The heart is normal in size; the mediastinal contour is within normal limits. No acute osseous abnormalities are seen. IMPRESSION: No acute cardiopulmonary process seen. Electronically Signed   By: Garald Balding M.D.   On: 06/21/2015 03:50   Ct Angio Chest Pe W/cm &/or Wo Cm  06/21/2015  CLINICAL DATA:  Acute onset constant chest pressure for 4 hours. Nausea and vomiting, diaphoresis. History of diabetes. EXAM: CT ANGIOGRAPHY CHEST WITH CONTRAST TECHNIQUE: Multidetector CT imaging of the chest was performed using the standard protocol during bolus administration of intravenous contrast. Multiplanar CT image reconstructions and MIPs were obtained to evaluate the vascular anatomy. CONTRAST:  100 cc Isovue 370 COMPARISON:  None. FINDINGS: PULMONARY ARTERY: Adequate contrast opacification of the pulmonary artery's. Main pulmonary artery is not enlarged. No pulmonary arterial filling defects to the level of the subsegmental branches. MEDIASTINUM: Heart and pericardium are unremarkable, no right heart strain. Thoracic aorta is normal course and caliber, unremarkable. No lymphadenopathy by CT size criteria. LUNGS: Tracheobronchial tree is patent, no pneumothorax. No pleural effusions, focal consolidations, pulmonary nodules or masses. SOFT TISSUES AND OSSEOUS STRUCTURES: Hypodense liver compatible with steatosis. Thyromegaly partially imaged with substernal extent. Osseous structures are unremarkable. Review of the MIP images confirms the above findings. IMPRESSION: No acute pulmonary embolism or acute cardiopulmonary process. Electronically Signed   By: Elon Alas M.D.   On: 06/21/2015 03:39    EKG: Independently reviewed.  Assessment/Plan Principal Problem:   Chest pain, atypical Active Problems:   DM (diabetes mellitus) ([facility])   Tobacco abuse   Chest pain  Atypical chest pain:  I don't think she has ACS, but given her CRF, will admit for r/out.   With PR depression, and position chest pain, it possible she has pericarditis.  Will continue with Toradol, and ASA.  Will obtain cardiac ECHO.    DM:  Will continue with her Metformin and obtain SSI, moderate ranges.     Obesity:  She likely will need a polysomnogram outpatient.     DVT prophylaxis: Lovenox. Code Status: FULL CODE.  Family Communication: none.  Disposition Plan: within 24 hours.  Consults called: None.  Admission status: OBS.   Loribeth Katich MD   FACP.  [facility]  If 7PM-7AM, please contact night-coverage [email or web address] Password [facility]  06/21/2015, 4:35 AM

## 2015-06-21 NOTE — Progress Notes (Signed)
Patient being transported to [facility] for cardiac cath. Report given to [facility] Transporter. Vital signs stable. Family at the bedside.

## 2015-06-21 NOTE — Progress Notes (Signed)
Pt c/o CP, 2100, nitro given, 1 dose, oxygen started, vs taken, ekg done, md paged, cp resolved, md Alejandro Mulling returned call at 2130, relayed information, no new orders given, monitored pt closely, no return of cp at time of note.  Will continue to monitor pt closely.

## 2015-06-21 NOTE — ED Provider Notes (Addendum)
CSN: [id number]     Arrival date & time 06/21/15  0046 History  By signing my name below, I, Nicole Kindred, attest that this documentation has been prepared under the direction and in the presence of Merryl Hacker, MD.   Electronically Signed: Nicole Kindred, ED Scribe. 06/21/2015. 4:09 AM   Chief Complaint  Patient presents with  . Chest Pain   The history is provided by the patient. No language interpreter was used.   HPI Comments: Jennifer Cervantes is a 35 y.o. female with PMHx of DM who presents to the Emergency Department complaining of sudden onset, constant, pressure, 3/10 central chest pain, onset about 4 hours ago. Pt reports she was walking when she felt nauseous and began to throw up. She sat down and became diaphoretic. She also complains of dizziness and feeling "as if she was about to faint". No other associated symptoms noted. No worsening or alleviating factors noted. Pt denies leg swelling, shortness of breath, hx of hypertension, or any other pertinent symptoms. She takes metformin daily.  Of note, patient was brought down by colleagues upstairs. She "didn't want to come." Reported increased stressors at home.  Past Medical History  Diagnosis Date  . Diabetes mellitus without complication ([place])    History reviewed. No pertinent past surgical history. No family history on file. Social History  Substance Use Topics  . Smoking status: Current Every Day Smoker  . Smokeless tobacco: None  . Alcohol Use: No   OB History    No data available     Review of Systems  Constitutional: Positive for diaphoresis.  Respiratory: Negative for shortness of breath.   Cardiovascular: Positive for chest pain. Negative for leg swelling.  Gastrointestinal: Positive for vomiting.  Neurological: Positive for dizziness.    Allergies  Review of patient's allergies indicates no known allergies.  Home Medications   Prior to Admission medications   Medication Sig Start  Date End Date Taking? Authorizing Provider  metFORMIN (GLUCOPHAGE) 1000 MG tablet Take 1,000 mg by mouth 2 (two) times daily with a meal.   Yes Historical Provider, MD   BP 149/106 mmHg  Pulse 95  Temp(Src) 97.9 F (36.6 C) (Oral)  Resp 16  Ht 5\' 9"  (1.753 m)  Wt 240 lb (108.863 kg)  BMI 35.43 kg/m2  SpO2 100%  LMP 05/26/2015 Physical Exam  Constitutional: She is oriented to person, place, and time. She appears well-developed and well-nourished.  Obese  HENT:  Head: Normocephalic and atraumatic.  Eyes: Pupils are equal, round, and reactive to light.  Cardiovascular: Normal rate, regular rhythm and normal heart sounds.   Pulmonary/Chest: Effort normal. No respiratory distress. She has no wheezes. She exhibits tenderness.  Abdominal: Soft. Bowel sounds are normal. There is no tenderness. There is no rebound.  Musculoskeletal: She exhibits no edema.  Neurological: She is alert and oriented to person, place, and time.  Skin: Skin is warm and dry.  Psychiatric: She has a normal mood and affect.  Nursing note and vitals reviewed.   ED Course  Procedures (including critical care time) DIAGNOSTIC STUDIES: Oxygen Saturation is 100% on RA, normal by my interpretation.    COORDINATION OF CARE: 1:08 AM-Discussed treatment plan which includes EKG with pt at bedside and pt agreed to plan.   Labs Review Labs Reviewed  CBC WITH DIFFERENTIAL/PLATELET - Abnormal; Notable for the following:    WBC 12.4 (*)    Hemoglobin 15.4 (*)    Neutro Abs 8.7 (*)    All  other components within normal limits  BASIC METABOLIC PANEL - Abnormal; Notable for the following:    CO2 20 (*)    Glucose, Bld 178 (*)    All other components within normal limits  TROPONIN I - Abnormal; Notable for the following:    Troponin I 0.06 (*)    All other components within normal limits  D-DIMER, QUANTITATIVE (NOT AT [facility]) - Abnormal; Notable for the following:    D-Dimer, Quant 0.61 (*)    All other components  within normal limits  HCG, QUANTITATIVE, PREGNANCY    Imaging Review Dg Chest 2 View  06/21/2015  CLINICAL DATA:  Acute onset of central chest pain. Initial encounter. EXAM: CHEST  2 VIEW COMPARISON:  CTA of the chest performed earlier today at 3:19 a.m. FINDINGS: The lungs are well-aerated and clear. There is no evidence of focal opacification, pleural effusion or pneumothorax. The heart is normal in size; the mediastinal contour is within normal limits. No acute osseous abnormalities are seen. IMPRESSION: No acute cardiopulmonary process seen. Electronically Signed   By: Garald Balding M.D.   On: 06/21/2015 03:50   Ct Angio Chest Pe W/cm &/or Wo Cm  06/21/2015  CLINICAL DATA:  Acute onset constant chest pressure for 4 hours. Nausea and vomiting, diaphoresis. History of diabetes. EXAM: CT ANGIOGRAPHY CHEST WITH CONTRAST TECHNIQUE: Multidetector CT imaging of the chest was performed using the standard protocol during bolus administration of intravenous contrast. Multiplanar CT image reconstructions and MIPs were obtained to evaluate the vascular anatomy. CONTRAST:  100 cc Isovue 370 COMPARISON:  None. FINDINGS: PULMONARY ARTERY: Adequate contrast opacification of the pulmonary artery's. Main pulmonary artery is not enlarged. No pulmonary arterial filling defects to the level of the subsegmental branches. MEDIASTINUM: Heart and pericardium are unremarkable, no right heart strain. Thoracic aorta is normal course and caliber, unremarkable. No lymphadenopathy by CT size criteria. LUNGS: Tracheobronchial tree is patent, no pneumothorax. No pleural effusions, focal consolidations, pulmonary nodules or masses. SOFT TISSUES AND OSSEOUS STRUCTURES: Hypodense liver compatible with steatosis. Thyromegaly partially imaged with substernal extent. Osseous structures are unremarkable. Review of the MIP images confirms the above findings. IMPRESSION: No acute pulmonary embolism or acute cardiopulmonary process.  Electronically Signed   By: Elon Alas M.D.   On: 06/21/2015 03:39   I have personally reviewed and evaluated these images and lab results as part of my medical decision-making.   EKG Interpretation   Date/Time:  Thursday June 21 2015 00:55:52 EDT Ventricular Rate:  96 PR Interval:  166 QRS Duration: 92 QT Interval:  369 QTC Calculation: 466 R Axis:   51 Text Interpretation:  Sinus rhythm No prior for comparison Confirmed by  Thoren Hosang  MD, Nathanyel Defenbaugh ([id number]) on 06/21/2015 1:59:39 AM      MDM   Final diagnoses:  Other chest pain  Elevated troponin   Patient presents with chest pain. Onset several hours ago. Associated diaphoresis. Somewhat atypical for ACS. She also has reproducible pain on exam. However, she has a history of diabetes and was noted to be hypertensive upon arrival.  EKG shows no evidence of acute ischemia. Basic labwork obtained. D-dimer mildly elevated at 0.61.  Troponin 0.06. Patient was given aspirin, Toradol, and nitroglycerin paste was applied. Patient with persistent chest pain ~2-3.  CT chest negative for blood clot. Patient's heart score is 3-4 depending upon how you weigh her risk factors. Given positive troponin, feel patient would benefit from a formal chest pain rule out. Discussed with Dr. Marin Comment who will evaluate  the patient.  I personally performed the services described in this documentation, which was scribed in my presence. The recorded information has been reviewed and is accurate.     Merryl Hacker, MD 06/21/15 [place], MD 06/21/15 [phone]

## 2015-06-21 NOTE — Care Management Note (Signed)
Case Management Note  Patient Details  Name: Jennifer Cervantes MRN: [id number] Date of Birth: [birth year]  Subjective/Objective:      Alert oriented from home, stated needs assistance with RX for glucometer.    Script given to patient.        Action/Plan:Patietn to be transferred to [facility].  Expected Discharge Date:                  Expected Discharge Plan:  Acute to Acute Transfer  In-House Referral:  NA  Discharge planning Services  NA  Post Acute Care Choice:  NA Choice offered to:  NA  DME Arranged:  N/A DME Agency:  NA  HH Arranged:  NA HH Agency:     Status of Service:  Completed, signed off  Medicare Important Message Given:    Date Medicare IM Given:    Medicare IM give by:    Date Additional Medicare IM Given:    Additional Medicare Important Message give by:     If discussed at [place] of Stay Meetings, dates discussed:    Additional Comments:  Alvie Heidelberg, RN 06/21/2015, 1:16 PM

## 2015-06-21 NOTE — H&P (View-Only) (Signed)
CARDIOLOGY CONSULT NOTE   Patient ID: Jennifer Cervantes MRN: [id number] DOB/AGE: December 20, 1980 35 y.o.  Admit date: 06/21/2015  Primary Physician   Jeffie Pollock, MD Primary Cardiologist   New Reason for Consultation   Chest pain  Jennifer Cervantes is a 35 y.o. year old female with a history of  DM, obesity, tobacco use. She was admitted 04/27 with chest pain, troponin is climbing and cardiology was asked to evaluate her.   About 9:00 last night, while she was at work, she had sudden onset of substernal/left chest pain that she describes as both sharp and throbbing. It reached a 7/10. It started without exertion. It went into her right arm with numbness. It does not change with deep inspiration.  When her symptoms did not resolve, she went to the emergency room. In the emergency room, she had nitroglycerin paste applied, with a reduction in her pain to a 3 or 4/10. She also got Toradol and aspirin. Her pain has stayed at a 3-4/10 all night long, and she now has a headache. Her chest pain does not get worse with deep inspiration or supine position. It will get worse if she stands up and she will feel a bit lightheaded.  Jennifer Cervantes has never had chest pain before. She has had no recent illnesses or problems. no cough, fever or chills. She feels that she stays well hydrated. She admits that she eats a lot of junk food.      Past Medical History  Diagnosis Date  . Diabetes mellitus without complication ([place])      History reviewed. No pertinent past surgical history.  No Known Allergies  I have reviewed the patient's current medications . aspirin EC  325 mg Oral Daily  . enoxaparin (LOVENOX) injection  40 mg Subcutaneous Q24H  . insulin aspart  0-15 Units Subcutaneous TID WC  . insulin aspart  0-5 Units Subcutaneous QHS  . sodium chloride flush  3 mL Intravenous Q12H   . sodium chloride 150 mL/hr at 06/21/15 0554   ketorolac, ondansetron **OR** ondansetron (ZOFRAN)  IV  Prior to Admission medications   Medication Sig Start Date End Date Taking? Authorizing Provider  metFORMIN (GLUCOPHAGE) 1000 MG tablet Take 1,000 mg by mouth 2 (two) times daily with a meal.   Yes Historical Provider, MD     Social History   Social History  . Marital Status: Single    Spouse Name: N/A  . Number of Children: N/A  . Years of Education: N/A   Occupational History  . Nurse [employer]   Social History Main Topics  . Smoking status: Current Every Day Smoker -- 0.50 packs/day for 23 years  . Smokeless tobacco: Never Used  . Alcohol Use: No  . Drug Use: No  . Sexual Activity: Not on file   Other Topics Concern  . Not on file   Social History Narrative   Lives in [place], works nights at [employer] Status  Relation Status Death Age  . Mother Alive   . Father Alive    Family History  Problem Relation Age of Onset  . Heart attack Neg Hx   . Heart failure Neg Hx      ROS:  Full 14 point review of systems complete and found to be negative unless listed above.  Physical Exam: Blood pressure 113/65, pulse 96, temperature 98 F (36.7 C), temperature source Oral, resp. rate 18, height 5\' 9"  (1.753 m), weight  239 lb 8 oz (108.636 kg), last menstrual period 05/26/2015, SpO2 98 %.  General: Well developed, well nourished, female in no acute distress Head: Eyes PERRLA, No xanthomas.   Normocephalic and atraumatic, oropharynx without edema or exudate. Dentition:  Good Lungs:  Clear bilaterally; chest wall is diffusely tender to palpation over the sternum and to the left of the sternum. Heart: HRRR S1 S2, no rub/gallop,  no murmur. pulses are 2+ all 4 extrem.   Neck: No carotid bruits. No lymphadenopathy.  JVD not elevated . Abdomen: Bowel sounds present, abdomen soft and non-tender without masses or hernias noted. Msk:  No spine or cva tenderness. No weakness, no joint deformities or effusions. Extremities: No clubbing or cyanosis. No edema.  Neuro:  Alert and oriented X 3. No focal deficits noted. Psych:  Good affect, responds appropriately Skin: No rashes or lesions noted.  Labs:   Lab Results  Component Value Date   WBC 12.4* 06/21/2015   HGB 15.4* 06/21/2015   HCT 44.2 06/21/2015   MCV 89.1 06/21/2015   PLT 215 06/21/2015     Recent Labs Lab 06/21/15 0225  NA 139  K 3.9  CL 105  CO2 20*  BUN 13  CREATININE 1.00  CALCIUM 9.5  GLUCOSE 178*    Recent Labs  06/21/15 0225 06/21/15 0532  TROPONINI 0.06* 0.40*   Lab Results  Component Value Date   DDIMER 0.61* 06/21/2015   TSH  Date/Time Value Ref Range Status  06/21/2015 05:32 AM 0.184* 0.350 - 4.500 uIU/mL Final   Echo: ordered  ECG:  06/21/2015 Sinus rhythm, less than 1 mm of ST elevation in multiple leads  Radiology:  Dg Chest 2 View 06/21/2015  CLINICAL DATA:  Acute onset of central chest pain. Initial encounter. EXAM: CHEST  2 VIEW COMPARISON:  CTA of the chest performed earlier today at 3:19 a.m. FINDINGS: The lungs are well-aerated and clear. There is no evidence of focal opacification, pleural effusion or pneumothorax. The heart is normal in size; the mediastinal contour is within normal limits. No acute osseous abnormalities are seen. IMPRESSION: No acute cardiopulmonary process seen. Electronically Signed   By: Garald Balding M.D.   On: 06/21/2015 03:50   Ct Angio Chest Pe W/cm &/or Wo Cm 06/21/2015  CLINICAL DATA:  Acute onset constant chest pressure for 4 hours. Nausea and vomiting, diaphoresis. History of diabetes. EXAM: CT ANGIOGRAPHY CHEST WITH CONTRAST TECHNIQUE: Multidetector CT imaging of the chest was performed using the standard protocol during bolus administration of intravenous contrast. Multiplanar CT image reconstructions and MIPs were obtained to evaluate the vascular anatomy. CONTRAST:  100 cc Isovue 370 COMPARISON:  None. FINDINGS: PULMONARY ARTERY: Adequate contrast opacification of the pulmonary artery's. Main pulmonary artery is not  enlarged. No pulmonary arterial filling defects to the level of the subsegmental branches. MEDIASTINUM: Heart and pericardium are unremarkable, no right heart strain. Thoracic aorta is normal course and caliber, unremarkable. No lymphadenopathy by CT size criteria. LUNGS: Tracheobronchial tree is patent, no pneumothorax. No pleural effusions, focal consolidations, pulmonary nodules or masses. SOFT TISSUES AND OSSEOUS STRUCTURES: Hypodense liver compatible with steatosis. Thyromegaly partially imaged with substernal extent. Osseous structures are unremarkable. Review of the MIP images confirms the above findings. IMPRESSION: No acute pulmonary embolism or acute cardiopulmonary process. Electronically Signed   By: Elon Alas M.D.   On: 06/21/2015 03:39    ASSESSMENT AND PLAN:   The patient was seen today by Dr Bronson Ing, the patient evaluated and the data reviewed.  Principal  Problem:   Chest pain, atypical - Symptoms were nonexertional and she has never had exertional chest pain - However, symptoms improved with nitroglycerin and her troponin is mildly elevated - ECG is not a STEMI, but there appears to be mild ST elevation in multiple leads - Make sure echo was done early - Check CK-MB and sedimentation rate - If echo is abnormal, or troponin continues to climb, will need cath  Otherwise, per IM  Active Problems:   DM (diabetes mellitus) ([place])   Tobacco abuse   Chest pain   Signed: Lenoard Aden 06/21/2015 9:43 AM Murlean Hark [id number]  Co-Sign MD  The patient was seen and examined, and I agree with the history, physical exam, assessment and plan as documented above which has been discussed with R. Barrett PA-C, with modifications as noted below. Pt continues to have chest pain. Most recent troponin 0.4. ECG with diffuse nonspecific ST elevations. Quick preliminary look at apical 4-chamber view shows marked apical hypokinesis. Will transfer to [facility] for coronary  angiography to rule out potential LAD lesion. Will start heparin, metoprolol, and Lipitor.  Kate Sable, MD, [facility]  06/21/2015 11:31 AM

## 2015-06-22 ENCOUNTER — Encounter (HOSPITAL_COMMUNITY)
Admission: EM | Disposition: A | Discharge status: Home / Self Care | Payer: Self-pay | Source: Home / Self Care | Attending: Interventional Cardiology

## 2015-06-22 ENCOUNTER — Encounter (HOSPITAL_COMMUNITY): Payer: Self-pay | Admitting: Interventional Cardiology

## 2015-06-22 DIAGNOSIS — I209 Angina pectoris, unspecified: Secondary | ICD-10-CM

## 2015-06-22 DIAGNOSIS — I2102 ST elevation (STEMI) myocardial infarction involving left anterior descending coronary artery: Secondary | ICD-10-CM

## 2015-06-22 DIAGNOSIS — R946 Abnormal results of thyroid function studies: Secondary | ICD-10-CM

## 2015-06-22 HISTORY — PX: CARDIAC CATHETERIZATION: SHX172

## 2015-06-22 LAB — GLUCOSE, CAPILLARY
GLUCOSE-CAPILLARY: 162 mg/dL — AB (ref 65–99)
GLUCOSE-CAPILLARY: 133 mg/dL — AB (ref 65–99)
GLUCOSE-CAPILLARY: 127 mg/dL — AB (ref 65–99)
Glucose-Capillary: 183 mg/dL — ABNORMAL HIGH (ref 65–99)

## 2015-06-22 LAB — LIPID PANEL
CHOLESTEROL: 152 mg/dL (ref 0–200)
HDL: 30 mg/dL — AB (ref >40)
LDL Cholesterol: 95 mg/dL (ref 0–99)
TRIGLYCERIDES: 137 mg/dL (ref <150)
Total CHOL/HDL Ratio: 5.1 RATIO
VLDL: 27 mg/dL (ref 0–40)

## 2015-06-22 LAB — COMPREHENSIVE METABOLIC PANEL
ALT: 25 U/L (ref 14–54)
ANION GAP: 10 (ref 5–15)
AST: 46 U/L — AB (ref 15–41)
Albumin: 3.4 g/dL — ABNORMAL LOW (ref 3.5–5.0)
Alkaline Phosphatase: 64 U/L (ref 38–126)
BUN: 9 mg/dL (ref 6–20)
CHLORIDE: 108 mmol/L (ref 101–111)
CO2: 21 mmol/L — ABNORMAL LOW (ref 22–32)
Calcium: 8.8 mg/dL — ABNORMAL LOW (ref 8.9–10.3)
Creatinine, Ser: 0.84 mg/dL (ref 0.44–1.00)
Glucose, Bld: 165 mg/dL — ABNORMAL HIGH (ref 65–99)
POTASSIUM: 4.2 mmol/L (ref 3.5–5.1)
Sodium: 139 mmol/L (ref 135–145)
Total Bilirubin: 0.3 mg/dL (ref 0.3–1.2)
Total Protein: 6.7 g/dL (ref 6.5–8.1)

## 2015-06-22 LAB — TROPONIN I
TROPONIN I: 2.55 ng/mL — AB (ref <0.031)
TROPONIN I: 2.99 ng/mL — AB (ref <0.031)
TROPONIN I: 5.51 ng/mL — AB (ref <0.031)
Troponin I: 4.35 ng/mL (ref <0.031)

## 2015-06-22 LAB — CBC
HCT: 35.5 % — ABNORMAL LOW (ref 36.0–46.0)
HEMOGLOBIN: 12 g/dL (ref 12.0–15.0)
MCH: 30.2 pg (ref 26.0–34.0)
MCHC: 33.8 g/dL (ref 30.0–36.0)
MCV: 89.4 fL (ref 78.0–100.0)
PLATELETS: 211 10*3/uL (ref 150–400)
RBC: 3.97 MIL/uL (ref 3.87–5.11)
RDW: 12.4 % (ref 11.5–15.5)
WBC: 8.2 10*3/uL (ref 4.0–10.5)

## 2015-06-22 LAB — T4, FREE: FREE T4: 0.87 ng/dL (ref 0.61–1.12)

## 2015-06-22 SURGERY — CORONARY/GRAFT ANGIOGRAPHY

## 2015-06-22 MED ORDER — HEPARIN SODIUM (PORCINE) 1000 UNIT/ML IJ SOLN
INTRAMUSCULAR | Status: AC
  Filled 2015-06-22: qty 1

## 2015-06-22 MED ORDER — SODIUM CHLORIDE 0.9 % IV SOLN: 250.0000 mL | INTRAVENOUS | Status: DC | PRN

## 2015-06-22 MED ORDER — FENTANYL CITRATE (PF) 100 MCG/2ML IJ SOLN
INTRAMUSCULAR | Status: DC | PRN
  Administered 2015-06-22: 25 ug via INTRAVENOUS

## 2015-06-22 MED ORDER — SODIUM CHLORIDE 0.9 % WEIGHT BASED INFUSION: 3.0000 mL/kg/h | INTRAVENOUS | Status: DC

## 2015-06-22 MED ORDER — SODIUM CHLORIDE 0.9 % WEIGHT BASED INFUSION: 1.0000 mL/kg/h | INTRAVENOUS | Status: DC

## 2015-06-22 MED ORDER — VERAPAMIL HCL 2.5 MG/ML IV SOLN
INTRAVENOUS | Status: DC | PRN
  Administered 2015-06-22: 10:00:00 via INTRA_ARTERIAL

## 2015-06-22 MED ORDER — NITROGLYCERIN 0.4 MG SL SUBL
0.4000 mg | SUBLINGUAL_TABLET | SUBLINGUAL | Status: AC | PRN
Start: 2015-06-22 — End: 2015-06-22
  Administered 2015-06-22 (×2): 0.4 mg via SUBLINGUAL
  Filled 2015-06-22: qty 1

## 2015-06-22 MED ORDER — LIDOCAINE HCL (PF) 1 % IJ SOLN
INTRAMUSCULAR | Status: AC
  Filled 2015-06-22: qty 30

## 2015-06-22 MED ORDER — LIDOCAINE HCL (PF) 1 % IJ SOLN
INTRAMUSCULAR | Status: DC | PRN
  Administered 2015-06-22: 2 mL

## 2015-06-22 MED ORDER — IOPAMIDOL (ISOVUE-370) INJECTION 76%
INTRAVENOUS | Status: DC | PRN
  Administered 2015-06-22: 60 mL via INTRA_ARTERIAL

## 2015-06-22 MED ORDER — IOPAMIDOL (ISOVUE-370) INJECTION 76%
INTRAVENOUS | Status: AC
  Filled 2015-06-22: qty 100

## 2015-06-22 MED ORDER — SODIUM CHLORIDE 0.9% FLUSH: 3.0000 mL | INTRAVENOUS | Status: DC | PRN

## 2015-06-22 MED ORDER — SODIUM CHLORIDE 0.9% FLUSH
3.0000 mL | Freq: Two times a day (BID) | INTRAVENOUS | Status: DC
  Administered 2015-06-22 – 2015-06-24 (×3): 3 mL via INTRAVENOUS

## 2015-06-22 MED ORDER — FENTANYL CITRATE (PF) 100 MCG/2ML IJ SOLN
INTRAMUSCULAR | Status: AC
  Filled 2015-06-22: qty 2

## 2015-06-22 MED ORDER — HEPARIN (PORCINE) IN NACL 2-0.9 UNIT/ML-% IJ SOLN
INTRAMUSCULAR | Status: AC
  Filled 2015-06-22: qty 500

## 2015-06-22 MED ORDER — VERAPAMIL HCL 2.5 MG/ML IV SOLN
INTRAVENOUS | Status: AC
  Filled 2015-06-22: qty 2

## 2015-06-22 MED ORDER — ISOSORBIDE MONONITRATE ER 30 MG PO TB24
15.0000 mg | ORAL_TABLET | Freq: Every day | ORAL | Status: DC
  Administered 2015-06-22 – 2015-06-25 (×3): 15 mg via ORAL
  Filled 2015-06-22 (×4): qty 1

## 2015-06-22 MED ORDER — VITAMIN D (ERGOCALCIFEROL) 1.25 MG (50000 UNIT) PO CAPS
50000.0000 [IU] | ORAL_CAPSULE | ORAL | Status: DC
  Administered 2015-06-22: 17:00:00 50000 [IU] via ORAL
  Filled 2015-06-22: qty 1

## 2015-06-22 MED ORDER — MIDAZOLAM HCL 2 MG/2ML IJ SOLN
INTRAMUSCULAR | Status: DC | PRN
  Administered 2015-06-22: 2 mg via INTRAVENOUS

## 2015-06-22 MED ORDER — HEPARIN (PORCINE) IN NACL 2-0.9 UNIT/ML-% IJ SOLN
INTRAMUSCULAR | Status: AC
  Filled 2015-06-22: qty 1000

## 2015-06-22 MED ORDER — HEPARIN SODIUM (PORCINE) 1000 UNIT/ML IJ SOLN
INTRAMUSCULAR | Status: DC | PRN
  Administered 2015-06-22: 5000 [IU] via INTRAVENOUS

## 2015-06-22 MED ORDER — NITROGLYCERIN 0.4 MG SL SUBL
SUBLINGUAL_TABLET | SUBLINGUAL | Status: AC
  Administered 2015-06-22: 07:00:00 0.4 mg via SUBLINGUAL
  Filled 2015-06-22: qty 1

## 2015-06-22 MED ORDER — SODIUM CHLORIDE 0.9% FLUSH
3.0000 mL | Freq: Two times a day (BID) | INTRAVENOUS | Status: DC
Start: 2015-06-22 — End: 2015-06-22

## 2015-06-22 MED ORDER — MIDAZOLAM HCL 2 MG/2ML IJ SOLN
INTRAMUSCULAR | Status: AC
  Filled 2015-06-22: qty 2

## 2015-06-22 MED ORDER — HEPARIN (PORCINE) IN NACL 2-0.9 UNIT/ML-% IJ SOLN
INTRAMUSCULAR | Status: DC | PRN
  Administered 2015-06-22: 1500 mL

## 2015-06-22 SURGICAL SUPPLY — 10 items
CATH INFINITI 5 FR JL3.5 (CATHETERS) ×3 IMPLANT
CATH INFINITI JR4 5F (CATHETERS) ×3 IMPLANT
DEVICE RAD COMP TR BAND LRG (VASCULAR PRODUCTS) ×3 IMPLANT
GLIDESHEATH SLEND SS 6F .021 (SHEATH) ×3 IMPLANT
KIT HEART LEFT (KITS) ×3 IMPLANT
PACK CARDIAC CATHETERIZATION (CUSTOM PROCEDURE TRAY) ×3 IMPLANT
TRANSDUCER W/STOPCOCK (MISCELLANEOUS) ×3 IMPLANT
TUBING CIL FLEX 10 FLL-RA (TUBING) ×3 IMPLANT
WIRE HI TORQ VERSACORE-J 145CM (WIRE) ×3 IMPLANT
WIRE SAFE-T 1.5MM-J .035X260CM (WIRE) ×3 IMPLANT

## 2015-06-22 NOTE — Progress Notes (Signed)
Brief X-Cover Note Notified at 5:15ish that Ms. Jennifer Cervantes is having some more CP. EKG with some subtle changes in inferior and anterolateral leads (twi). Trial of SL NTG and IV morphine. CP resolving after SL NTG. Will update team in AM as if symptoms do not resolve she may need a re-look cath.   Jules Husbands, MD

## 2015-06-22 NOTE — Progress Notes (Signed)
0825 patient lying in bed , O2 2 liters nasal cannula. States she has nausea, not diaphoretic. Denies shotness of breath. C/O chest pain 5/10 across chest with numbness down right arm to fingertips. ECG done and read by Dr. Debara Pickett. Patient given ntg 0.4 mg sl at 0828, BP 122/63. 0830 chest pain 3/10 numbness in right arm present but less intense. BP 106/66. 0833 Pain resolved, numbness in right upper arm continued, lower arm numbness resolved, BP 108/64. Dr. Debara Pickett into see patient.

## 2015-06-22 NOTE — Progress Notes (Signed)
Pt c/o cp, with nausea, pt already on 2L oxygen, ekg done, 1 nitro given, md contacted by charge nurse Helayne Semin[place], RN, orders given, pain level went from 7 to 4, per orders from MD 2mg  morphine given and 1 nitro given, vitals taken, holding, pain from 4 to 2, then resolved, pt resting in bed, will continue to check on pt frequently.

## 2015-06-22 NOTE — Progress Notes (Signed)
Dr. Jules Husbands paged, notified pt having chest pain 7-8/10; at the time of call back pt had been given 1 SL NTG tab, 2l o2 via [place], EKG in chart with changes noted.  Dr. Claiborne Billings reviewed electronic chart EKG; ordered morphine; will call back if unresolved s/p intervention. And prepare to take to cath lab if no chest pain relief.

## 2015-06-22 NOTE — Interval H&P Note (Signed)
History and Physical Interval Note:  06/22/2015 10:04 AM  Jennifer Cervantes  has presented today for surgery, with the diagnosis of cp  The various methods of treatment have been discussed with the patient and family. After consideration of risks, benefits and other options for treatment, the patient has consented to  Procedure(s): Left Heart Cath and Coronary Angiography (N/A) as a surgical intervention .  The patient's history has been reviewed, patient examined, no change in status, stable for surgery.  I have reviewed the patient's chart and labs.  Questions were answered to the patient's satisfaction.   Cath Lab Visit (complete for each Cath Lab visit)  Clinical Evaluation Leading to the Procedure:   ACS: Yes.    Non-ACS:    Anginal Classification: CCS IV  Anti-ischemic medical therapy: Maximal Therapy (2 or more classes of medications)  Non-Invasive Test Results: No non-invasive testing performed  Prior CABG: No previous CABG        Collier Salina [facility] 06/22/2015 10:04 AM

## 2015-06-22 NOTE — Progress Notes (Signed)
CARDIAC REHAB PHASE I   Per RN, pt continues to c/o chest discomfort. RN to contact cardiac rehab when pt ready for ambulation/education. Will hold for now.   Lenna Sciara, RN, BSN 06/22/2015 8:32 AM

## 2015-06-22 NOTE — Progress Notes (Signed)
TR BAND REMOVAL  LOCATION:    right radial  DEFLATED PER PROTOCOL:    Yes.    TIME BAND OFF / DRESSING APPLIED:    1300   SITE UPON ARRIVAL:    Level 0  SITE AFTER BAND REMOVAL:    Level 0  CIRCULATION SENSATION AND MOVEMENT:    Within Normal Limits   Yes.    COMMENTS:   Rechecked and no change in assessment noted

## 2015-06-22 NOTE — H&P (View-Only) (Signed)
DAILY PROGRESS NOTE  Subjective:  Chest pain overnight - nausea and vomiting. Responded to nitro, then recurred this morning. EKG shows evolving anterior MI changes with deep lateral TWI's. ST segments do not appear more elevated.  Objective:  Temp:  [97.8 F (36.6 C)-98.7 F (37.1 C)] 98 F (36.7 C) (04/28 0424) Pulse Rate:  [65-91] 66 (04/28 0828) Resp:  [11-31] 19 (04/28 0828) BP: (89-123)/(55-87) 122/63 mmHg (04/28 0828) SpO2:  [96 %-100 %] 100 % (04/28 0828) Weight change:   Intake/Output from previous day: 04/27 0701 - 04/28 0700 In: 2025 [P.O.:220; I.V.:1805] Out: -   Intake/Output from this shift:    Medications: Current Facility-Administered Medications  Medication Dose Route Frequency Provider Last Rate Last Dose  . 0.9 %  sodium chloride infusion   Intravenous Continuous Rexene Alberts, MD 50 mL/hr at 06/22/15 0200    . 0.9 %  sodium chloride infusion  250 mL Intravenous PRN Belva Crome, MD      . acetaminophen (TYLENOL) tablet 650 mg  650 mg Oral Q4H PRN Rexene Alberts, MD   650 mg at 06/22/15 0505  . aspirin EC tablet 81 mg  81 mg Oral Daily Belva Crome, MD      . atorvastatin (LIPITOR) tablet 80 mg  80 mg Oral q1800 Belva Crome, MD   80 mg at 06/21/15 2126  . carvedilol (COREG) tablet 3.125 mg  3.125 mg Oral BID WC Belva Crome, MD   3.125 mg at 06/21/15 1949  . insulin aspart (novoLOG) injection 0-15 Units  0-15 Units Subcutaneous TID WC Orvan Falconer, MD   3 Units at 06/21/15 1304  . insulin aspart (novoLOG) injection 0-5 Units  0-5 Units Subcutaneous QHS Orvan Falconer, MD      . ketorolac (TORADOL) 30 MG/ML injection 30 mg  30 mg Intravenous Q6H PRN Orvan Falconer, MD      . lisinopril (PRINIVIL,ZESTRIL) tablet 2.5 mg  2.5 mg Oral Daily Belva Crome, MD      . morphine 2 MG/ML injection 2-4 mg  2-4 mg Intravenous Q1H PRN Evelene Croon Barrett, PA-C   2 mg at 06/22/15 0515  . ondansetron (ZOFRAN) tablet 4 mg  4 mg Oral Q6H PRN Orvan Falconer, MD       Or  . ondansetron  [facility]) injection 4 mg  4 mg Intravenous Q6H PRN Orvan Falconer, MD      . ondansetron [facility]) injection 4 mg  4 mg Intravenous Q6H PRN Belva Crome, MD   4 mg at 06/22/15 0505  . oxyCODONE-acetaminophen (PERCOCET/ROXICET) 5-325 MG per tablet 1-2 tablet  1-2 tablet Oral Q4H PRN Belva Crome, MD      . sodium chloride flush (NS) 0.9 % injection 3 mL  3 mL Intravenous Q12H Orvan Falconer, MD   3 mL at 06/21/15 1000  . sodium chloride flush (NS) 0.9 % injection 3 mL  3 mL Intravenous Q12H Belva Crome, MD   3 mL at 06/21/15 2136  . sodium chloride flush (NS) 0.9 % injection 3 mL  3 mL Intravenous PRN Belva Crome, MD      . ticagrelor [facility]) tablet 90 mg  90 mg Oral BID Belva Crome, MD   90 mg at 06/22/15 0355    Physical Exam: General appearance: alert and mild distress Lungs: clear to auscultation bilaterally Heart: regular rate and rhythm, S1, S2 normal, no murmur, click, rub or gallop Extremities: extremities normal, atraumatic, no cyanosis or  edema Neurologic: Mental status: Alert, oriented, thought content appropriate  Lab Results: Results for orders placed or performed during the hospital encounter of 06/21/15 (from the past 48 hour(s))  CBC with Differential     Status: Abnormal   Collection Time: 06/21/15  1:49 AM  Result Value Ref Range   WBC 12.4 (H) 4.0 - 10.5 K/uL   RBC 4.96 3.87 - 5.11 MIL/uL   Hemoglobin 15.4 (H) 12.0 - 15.0 g/dL   HCT 44.2 36.0 - 46.0 %   MCV 89.1 78.0 - 100.0 fL   MCH 31.0 26.0 - 34.0 pg   MCHC 34.8 30.0 - 36.0 g/dL   RDW 12.3 11.5 - 15.5 %   Platelets 215 150 - 400 K/uL   Neutrophils Relative % 70 %   Neutro Abs 8.7 (H) 1.7 - 7.7 K/uL   Lymphocytes Relative 25 %   Lymphs Abs 3.1 0.7 - 4.0 K/uL   Monocytes Relative 4 %   Monocytes Absolute 0.5 0.1 - 1.0 K/uL   Eosinophils Relative 1 %   Eosinophils Absolute 0.1 0.0 - 0.7 K/uL   Basophils Relative 0 %   Basophils Absolute 0.0 0.0 - 0.1 K/uL  Basic metabolic panel     Status: Abnormal   Collection  Time: 06/21/15  2:25 AM  Result Value Ref Range   Sodium 139 135 - 145 mmol/L   Potassium 3.9 3.5 - 5.1 mmol/L   Chloride 105 101 - 111 mmol/L   CO2 20 (L) 22 - 32 mmol/L   Glucose, Bld 178 (H) 65 - 99 mg/dL   BUN 13 6 - 20 mg/dL   Creatinine, Ser 1.00 0.44 - 1.00 mg/dL   Calcium 9.5 8.9 - 10.3 mg/dL   GFR calc non Af Amer >60 >60 mL/min   GFR calc Af Amer >60 >60 mL/min    Comment: (NOTE) The eGFR has been calculated using the CKD EPI equation. This calculation has not been validated in all clinical situations. eGFR's persistently <60 mL/min signify possible Chronic Kidney Disease.    Anion gap 14 5 - 15  Troponin I     Status: Abnormal   Collection Time: 06/21/15  2:25 AM  Result Value Ref Range   Troponin I 0.06 (H) <0.031 ng/mL    Comment:        PERSISTENTLY INCREASED TROPONIN VALUES IN THE RANGE OF 0.04-0.49 ng/mL CAN BE SEEN IN:       -UNSTABLE ANGINA       -CONGESTIVE HEART FAILURE       -MYOCARDITIS       -CHEST TRAUMA       -ARRYHTHMIAS       -LATE PRESENTING MYOCARDIAL INFARCTION       -COPD   CLINICAL FOLLOW-UP RECOMMENDED.   D-dimer, quantitative (not at [facility])     Status: Abnormal   Collection Time: 06/21/15  2:25 AM  Result Value Ref Range   D-Dimer, Quant 0.61 (H) 0.00 - 0.50 ug/mL-FEU    Comment: (NOTE) At the manufacturer cut-off of 0.50 ug/mL FEU, this assay has been documented to exclude PE with a sensitivity and negative predictive value of 97 to 99%.  At this time, this assay has not been approved by the FDA to exclude DVT/VTE. Results should be correlated with clinical presentation.   hCG, quantitative, pregnancy     Status: None   Collection Time: 06/21/15  2:25 AM  Result Value Ref Range   hCG, Beta Chain, Quant, S <1 <5 mIU/mL  Comment:          GEST. AGE      CONC.  (mIU/mL)   <=1 WEEK        5 - 50     2 WEEKS       50 - 500     3 WEEKS       100 - 10,000     4 WEEKS     1,000 - 30,000     5 WEEKS     3,500 - 115,000   6-8 WEEKS      12,000 - 270,000    12 WEEKS     15,000 - 220,000        FEMALE AND NON-PREGNANT FEMALE:     LESS THAN 5 mIU/mL   TSH     Status: Abnormal   Collection Time: 06/21/15  5:32 AM  Result Value Ref Range   TSH 0.184 (L) 0.350 - 4.500 uIU/mL  Troponin I     Status: Abnormal   Collection Time: 06/21/15  5:32 AM  Result Value Ref Range   Troponin I 0.40 (H) <0.031 ng/mL    Comment: DELTA CHECK NOTED        PERSISTENTLY INCREASED TROPONIN VALUES IN THE RANGE OF 0.04-0.49 ng/mL CAN BE SEEN IN:       -UNSTABLE ANGINA       -CONGESTIVE HEART FAILURE       -MYOCARDITIS       -CHEST TRAUMA       -ARRYHTHMIAS       -LATE PRESENTING MYOCARDIAL INFARCTION       -COPD   CLINICAL FOLLOW-UP RECOMMENDED.   Glucose, capillary     Status: Abnormal   Collection Time: 06/21/15  7:59 AM  Result Value Ref Range   Glucose-Capillary 216 (H) 65 - 99 mg/dL  Troponin I     Status: Abnormal   Collection Time: 06/21/15 10:52 AM  Result Value Ref Range   Troponin I 6.94 (HH) <0.031 ng/mL    Comment: CRITICAL RESULT CALLED TO, READ BACK BY AND VERIFIED WITH: DILDY,V AT 11:35AM ON 06/21/15 BY FESTERMAN,C        POSSIBLE MYOCARDIAL ISCHEMIA. SERIAL TESTING RECOMMENDED.   Glucose, capillary     Status: Abnormal   Collection Time: 06/21/15 12:29 PM  Result Value Ref Range   Glucose-Capillary 175 (H) 65 - 99 mg/dL   Comment 1 Notify RN    Comment 2 Document in Chart   Protime-INR     Status: None   Collection Time: 06/21/15 12:38 PM  Result Value Ref Range   Prothrombin Time 15.1 11.6 - 15.2 seconds   INR 1.17 0.00 - 1.49  APTT     Status: Abnormal   Collection Time: 06/21/15 12:38 PM  Result Value Ref Range   aPTT 92 (H) 24 - 37 seconds    Comment:        IF BASELINE aPTT IS ELEVATED, SUGGEST PATIENT RISK ASSESSMENT BE USED TO DETERMINE APPROPRIATE ANTICOAGULANT THERAPY.   POCT Activated clotting time     Status: None   Collection Time: 06/21/15  4:40 PM  Result Value Ref Range    Activated Clotting Time 693 seconds  Glucose, capillary     Status: None   Collection Time: 06/21/15  6:33 PM  Result Value Ref Range   Glucose-Capillary 99 65 - 99 mg/dL  Troponin I (serum)     Status: Abnormal   Collection Time: 06/21/15  6:53 PM  Result Value  Ref Range   Troponin I 7.82 (HH) <0.031 ng/mL    Comment:        POSSIBLE MYOCARDIAL ISCHEMIA. SERIAL TESTING RECOMMENDED. CRITICAL RESULT CALLED TO, READ BACK BY AND VERIFIED WITH: T SCOTT,RN 1950 06/21/15 D BRADLEY   Glucose, capillary     Status: Abnormal   Collection Time: 06/21/15  9:24 PM  Result Value Ref Range   Glucose-Capillary 174 (H) 65 - 99 mg/dL  Lipid panel     Status: Abnormal   Collection Time: 06/22/15 12:33 AM  Result Value Ref Range   Cholesterol 152 0 - 200 mg/dL   Triglycerides 137 <150 mg/dL   HDL 30 (L) >40 mg/dL   Total CHOL/HDL Ratio 5.1 RATIO   VLDL 27 0 - 40 mg/dL   LDL Cholesterol 95 0 - 99 mg/dL    Comment:        Total Cholesterol/HDL:CHD Risk Coronary Heart Disease Risk Table                     Men   Women  1/2 Average Risk   3.4   3.3  Average Risk       5.0   4.4  2 X Average Risk   9.6   7.1  3 X Average Risk  23.4   11.0        Use the calculated Patient Ratio above and the CHD Risk Table to determine the patient's CHD Risk.        ATP III CLASSIFICATION (LDL):  <100     mg/dL   Optimal  100-129  mg/dL   Near or Above                    Optimal  130-159  mg/dL   Borderline  160-189  mg/dL   High  >190     mg/dL   Very High   Troponin I (serum)     Status: Abnormal   Collection Time: 06/22/15 12:33 AM  Result Value Ref Range   Troponin I 5.51 (HH) <0.031 ng/mL    Comment:        POSSIBLE MYOCARDIAL ISCHEMIA. SERIAL TESTING RECOMMENDED. CRITICAL VALUE NOTED.  VALUE IS CONSISTENT WITH PREVIOUSLY REPORTED AND CALLED VALUE.   Glucose, capillary     Status: Abnormal   Collection Time: 06/22/15  6:28 AM  Result Value Ref Range   Glucose-Capillary 162 (H) 65 - 99 mg/dL   CBC     Status: Abnormal   Collection Time: 06/22/15  6:56 AM  Result Value Ref Range   WBC 8.2 4.0 - 10.5 K/uL   RBC 3.97 3.87 - 5.11 MIL/uL   Hemoglobin 12.0 12.0 - 15.0 g/dL   HCT 35.5 (L) 36.0 - 46.0 %   MCV 89.4 78.0 - 100.0 fL   MCH 30.2 26.0 - 34.0 pg   MCHC 33.8 30.0 - 36.0 g/dL   RDW 12.4 11.5 - 15.5 %   Platelets 211 150 - 400 K/uL  Comprehensive metabolic panel     Status: Abnormal   Collection Time: 06/22/15  6:56 AM  Result Value Ref Range   Sodium 139 135 - 145 mmol/L   Potassium 4.2 3.5 - 5.1 mmol/L   Chloride 108 101 - 111 mmol/L   CO2 21 (L) 22 - 32 mmol/L   Glucose, Bld 165 (H) 65 - 99 mg/dL   BUN 9 6 - 20 mg/dL   Creatinine, Ser 0.84 0.44 - 1.00 mg/dL  Calcium 8.8 (L) 8.9 - 10.3 mg/dL   Total Protein 6.7 6.5 - 8.1 g/dL   Albumin 3.4 (L) 3.5 - 5.0 g/dL   AST 46 (H) 15 - 41 U/L   ALT 25 14 - 54 U/L   Alkaline Phosphatase 64 38 - 126 U/L   Total Bilirubin 0.3 0.3 - 1.2 mg/dL   GFR calc non Af Amer >60 >60 mL/min   GFR calc Af Amer >60 >60 mL/min    Comment: (NOTE) The eGFR has been calculated using the CKD EPI equation. This calculation has not been validated in all clinical situations. eGFR's persistently <60 mL/min signify possible Chronic Kidney Disease.    Anion gap 10 5 - 15  T4, free     Status: None   Collection Time: 06/22/15  6:56 AM  Result Value Ref Range   Free T4 0.87 0.61 - 1.12 ng/dL  Troponin I (serum)     Status: Abnormal   Collection Time: 06/22/15  6:56 AM  Result Value Ref Range   Troponin I 4.35 (HH) <0.031 ng/mL    Comment:        POSSIBLE MYOCARDIAL ISCHEMIA. SERIAL TESTING RECOMMENDED. CRITICAL VALUE NOTED.  VALUE IS CONSISTENT WITH PREVIOUSLY REPORTED AND CALLED VALUE.     Imaging: Dg Chest 2 View  06/21/2015  CLINICAL DATA:  Acute onset of central chest pain. Initial encounter. EXAM: CHEST  2 VIEW COMPARISON:  CTA of the chest performed earlier today at 3:19 a.m. FINDINGS: The lungs are well-aerated and clear. There  is no evidence of focal opacification, pleural effusion or pneumothorax. The heart is normal in size; the mediastinal contour is within normal limits. No acute osseous abnormalities are seen. IMPRESSION: No acute cardiopulmonary process seen. Electronically Signed   By: Garald Balding M.D.   On: 06/21/2015 03:50   Ct Angio Chest Pe W/cm &/or Wo Cm  06/21/2015  CLINICAL DATA:  Acute onset constant chest pressure for 4 hours. Nausea and vomiting, diaphoresis. History of diabetes. EXAM: CT ANGIOGRAPHY CHEST WITH CONTRAST TECHNIQUE: Multidetector CT imaging of the chest was performed using the standard protocol during bolus administration of intravenous contrast. Multiplanar CT image reconstructions and MIPs were obtained to evaluate the vascular anatomy. CONTRAST:  100 cc Isovue 370 COMPARISON:  None. FINDINGS: PULMONARY ARTERY: Adequate contrast opacification of the pulmonary artery's. Main pulmonary artery is not enlarged. No pulmonary arterial filling defects to the level of the subsegmental branches. MEDIASTINUM: Heart and pericardium are unremarkable, no right heart strain. Thoracic aorta is normal course and caliber, unremarkable. No lymphadenopathy by CT size criteria. LUNGS: Tracheobronchial tree is patent, no pneumothorax. No pleural effusions, focal consolidations, pulmonary nodules or masses. SOFT TISSUES AND OSSEOUS STRUCTURES: Hypodense liver compatible with steatosis. Thyromegaly partially imaged with substernal extent. Osseous structures are unremarkable. Review of the MIP images confirms the above findings. IMPRESSION: No acute pulmonary embolism or acute cardiopulmonary process. Electronically Signed   By: Elon Alas M.D.   On: 06/21/2015 03:39    Assessment:  1. Principal Problem: 2.   Chest pain, atypical 3. Active Problems: 4.   Type 2 diabetes with complication (HCC) 5.   Tobacco abuse 6.   Chest pain 7.   STEMI (ST elevation myocardial infarction) ([place]) 8.   Obesity 9.    Abnormal thyroid function test 10.   Elevated troponin 11.   Non-ST elevation MI (NSTEMI) ([place]) 12.   Plan:  1. Recurrent angina - similar pain to her pain yesterday, s/p anterior MI and mid-LAD  stent. Reviewed the films - appears to be thrombus, dissection - but no clear distal LAD findings to support this. Pain could be coronary spasm or distal embolization - given the recurrence x 2 (improved with nitroglycerin) - would recommend re-look catheterization. Could then treat with long-acting nitrate. Give zofran now for nausea. Notified the cath lab that I would prefer her to have repeat cath sooner this morning.  Time Spent Directly with Patient:  15 minutes  Length of Stay:  LOS: 1 day   Pixie Casino, MD, [facility] Attending Cardiologist [place] 06/22/2015, 8:45 AM

## 2015-06-22 NOTE — Progress Notes (Signed)
DAILY PROGRESS NOTE  Subjective:  Chest pain overnight - nausea and vomiting. Responded to nitro, then recurred this morning. EKG shows evolving anterior MI changes with deep lateral TWI's. ST segments do not appear more elevated.  Objective:  Temp:  [97.8 F (36.6 C)-98.7 F (37.1 C)] 98 F (36.7 C) (04/28 0424) Pulse Rate:  [65-91] 66 (04/28 0828) Resp:  [11-31] 19 (04/28 0828) BP: (89-123)/(55-87) 122/63 mmHg (04/28 0828) SpO2:  [96 %-100 %] 100 % (04/28 0828) Weight change:   Intake/Output from previous day: 04/27 0701 - 04/28 0700 In: 2025 [P.O.:220; I.V.:1805] Out: -   Intake/Output from this shift:    Medications: Current Facility-Administered Medications  Medication Dose Route Frequency Provider Last Rate Last Dose  . 0.9 %  sodium chloride infusion   Intravenous Continuous Rexene Alberts, MD 50 mL/hr at 06/22/15 0200    . 0.9 %  sodium chloride infusion  250 mL Intravenous PRN Belva Crome, MD      . acetaminophen (TYLENOL) tablet 650 mg  650 mg Oral Q4H PRN Rexene Alberts, MD   650 mg at 06/22/15 0505  . aspirin EC tablet 81 mg  81 mg Oral Daily Belva Crome, MD      . atorvastatin (LIPITOR) tablet 80 mg  80 mg Oral q1800 Belva Crome, MD   80 mg at 06/21/15 2126  . carvedilol (COREG) tablet 3.125 mg  3.125 mg Oral BID WC Belva Crome, MD   3.125 mg at 06/21/15 1949  . insulin aspart (novoLOG) injection 0-15 Units  0-15 Units Subcutaneous TID WC Orvan Falconer, MD   3 Units at 06/21/15 1304  . insulin aspart (novoLOG) injection 0-5 Units  0-5 Units Subcutaneous QHS Orvan Falconer, MD      . ketorolac (TORADOL) 30 MG/ML injection 30 mg  30 mg Intravenous Q6H PRN Orvan Falconer, MD      . lisinopril (PRINIVIL,ZESTRIL) tablet 2.5 mg  2.5 mg Oral Daily Belva Crome, MD      . morphine 2 MG/ML injection 2-4 mg  2-4 mg Intravenous Q1H PRN Evelene Croon Barrett, PA-C   2 mg at 06/22/15 0515  . ondansetron (ZOFRAN) tablet 4 mg  4 mg Oral Q6H PRN Orvan Falconer, MD       Or  . ondansetron  [facility]) injection 4 mg  4 mg Intravenous Q6H PRN Orvan Falconer, MD      . ondansetron [facility]) injection 4 mg  4 mg Intravenous Q6H PRN Belva Crome, MD   4 mg at 06/22/15 0505  . oxyCODONE-acetaminophen (PERCOCET/ROXICET) 5-325 MG per tablet 1-2 tablet  1-2 tablet Oral Q4H PRN Belva Crome, MD      . sodium chloride flush (NS) 0.9 % injection 3 mL  3 mL Intravenous Q12H Orvan Falconer, MD   3 mL at 06/21/15 1000  . sodium chloride flush (NS) 0.9 % injection 3 mL  3 mL Intravenous Q12H Belva Crome, MD   3 mL at 06/21/15 2136  . sodium chloride flush (NS) 0.9 % injection 3 mL  3 mL Intravenous PRN Belva Crome, MD      . ticagrelor [facility]) tablet 90 mg  90 mg Oral BID Belva Crome, MD   90 mg at 06/22/15 0355    Physical Exam: General appearance: alert and mild distress Lungs: clear to auscultation bilaterally Heart: regular rate and rhythm, S1, S2 normal, no murmur, click, rub or gallop Extremities: extremities normal, atraumatic, no cyanosis or  edema Neurologic: Mental status: Alert, oriented, thought content appropriate  Lab Results: Results for orders placed or performed during the hospital encounter of 06/21/15 (from the past 48 hour(s))  CBC with Differential     Status: Abnormal   Collection Time: 06/21/15  1:49 AM  Result Value Ref Range   WBC 12.4 (H) 4.0 - 10.5 K/uL   RBC 4.96 3.87 - 5.11 MIL/uL   Hemoglobin 15.4 (H) 12.0 - 15.0 g/dL   HCT 44.2 36.0 - 46.0 %   MCV 89.1 78.0 - 100.0 fL   MCH 31.0 26.0 - 34.0 pg   MCHC 34.8 30.0 - 36.0 g/dL   RDW 12.3 11.5 - 15.5 %   Platelets 215 150 - 400 K/uL   Neutrophils Relative % 70 %   Neutro Abs 8.7 (H) 1.7 - 7.7 K/uL   Lymphocytes Relative 25 %   Lymphs Abs 3.1 0.7 - 4.0 K/uL   Monocytes Relative 4 %   Monocytes Absolute 0.5 0.1 - 1.0 K/uL   Eosinophils Relative 1 %   Eosinophils Absolute 0.1 0.0 - 0.7 K/uL   Basophils Relative 0 %   Basophils Absolute 0.0 0.0 - 0.1 K/uL  Basic metabolic panel     Status: Abnormal   Collection  Time: 06/21/15  2:25 AM  Result Value Ref Range   Sodium 139 135 - 145 mmol/L   Potassium 3.9 3.5 - 5.1 mmol/L   Chloride 105 101 - 111 mmol/L   CO2 20 (L) 22 - 32 mmol/L   Glucose, Bld 178 (H) 65 - 99 mg/dL   BUN 13 6 - 20 mg/dL   Creatinine, Ser 1.00 0.44 - 1.00 mg/dL   Calcium 9.5 8.9 - 10.3 mg/dL   GFR calc non Af Amer >60 >60 mL/min   GFR calc Af Amer >60 >60 mL/min    Comment: (NOTE) The eGFR has been calculated using the CKD EPI equation. This calculation has not been validated in all clinical situations. eGFR's persistently <60 mL/min signify possible Chronic Kidney Disease.    Anion gap 14 5 - 15  Troponin I     Status: Abnormal   Collection Time: 06/21/15  2:25 AM  Result Value Ref Range   Troponin I 0.06 (H) <0.031 ng/mL    Comment:        PERSISTENTLY INCREASED TROPONIN VALUES IN THE RANGE OF 0.04-0.49 ng/mL CAN BE SEEN IN:       -UNSTABLE ANGINA       -CONGESTIVE HEART FAILURE       -MYOCARDITIS       -CHEST TRAUMA       -ARRYHTHMIAS       -LATE PRESENTING MYOCARDIAL INFARCTION       -COPD   CLINICAL FOLLOW-UP RECOMMENDED.   D-dimer, quantitative (not at [facility])     Status: Abnormal   Collection Time: 06/21/15  2:25 AM  Result Value Ref Range   D-Dimer, Quant 0.61 (H) 0.00 - 0.50 ug/mL-FEU    Comment: (NOTE) At the manufacturer cut-off of 0.50 ug/mL FEU, this assay has been documented to exclude PE with a sensitivity and negative predictive value of 97 to 99%.  At this time, this assay has not been approved by the FDA to exclude DVT/VTE. Results should be correlated with clinical presentation.   hCG, quantitative, pregnancy     Status: None   Collection Time: 06/21/15  2:25 AM  Result Value Ref Range   hCG, Beta Chain, Quant, S <1 <5 mIU/mL  Comment:          GEST. AGE      CONC.  (mIU/mL)   <=1 WEEK        5 - 50     2 WEEKS       50 - 500     3 WEEKS       100 - 10,000     4 WEEKS     1,000 - 30,000     5 WEEKS     3,500 - 115,000   6-8 WEEKS      12,000 - 270,000    12 WEEKS     15,000 - 220,000        FEMALE AND NON-PREGNANT FEMALE:     LESS THAN 5 mIU/mL   TSH     Status: Abnormal   Collection Time: 06/21/15  5:32 AM  Result Value Ref Range   TSH 0.184 (L) 0.350 - 4.500 uIU/mL  Troponin I     Status: Abnormal   Collection Time: 06/21/15  5:32 AM  Result Value Ref Range   Troponin I 0.40 (H) <0.031 ng/mL    Comment: DELTA CHECK NOTED        PERSISTENTLY INCREASED TROPONIN VALUES IN THE RANGE OF 0.04-0.49 ng/mL CAN BE SEEN IN:       -UNSTABLE ANGINA       -CONGESTIVE HEART FAILURE       -MYOCARDITIS       -CHEST TRAUMA       -ARRYHTHMIAS       -LATE PRESENTING MYOCARDIAL INFARCTION       -COPD   CLINICAL FOLLOW-UP RECOMMENDED.   Glucose, capillary     Status: Abnormal   Collection Time: 06/21/15  7:59 AM  Result Value Ref Range   Glucose-Capillary 216 (H) 65 - 99 mg/dL  Troponin I     Status: Abnormal   Collection Time: 06/21/15 10:52 AM  Result Value Ref Range   Troponin I 6.94 (HH) <0.031 ng/mL    Comment: CRITICAL RESULT CALLED TO, READ BACK BY AND VERIFIED WITH: DILDY,V AT 11:35AM ON 06/21/15 BY FESTERMAN,C        POSSIBLE MYOCARDIAL ISCHEMIA. SERIAL TESTING RECOMMENDED.   Glucose, capillary     Status: Abnormal   Collection Time: 06/21/15 12:29 PM  Result Value Ref Range   Glucose-Capillary 175 (H) 65 - 99 mg/dL   Comment 1 Notify RN    Comment 2 Document in Chart   Protime-INR     Status: None   Collection Time: 06/21/15 12:38 PM  Result Value Ref Range   Prothrombin Time 15.1 11.6 - 15.2 seconds   INR 1.17 0.00 - 1.49  APTT     Status: Abnormal   Collection Time: 06/21/15 12:38 PM  Result Value Ref Range   aPTT 92 (H) 24 - 37 seconds    Comment:        IF BASELINE aPTT IS ELEVATED, SUGGEST PATIENT RISK ASSESSMENT BE USED TO DETERMINE APPROPRIATE ANTICOAGULANT THERAPY.   POCT Activated clotting time     Status: None   Collection Time: 06/21/15  4:40 PM  Result Value Ref Range    Activated Clotting Time 693 seconds  Glucose, capillary     Status: None   Collection Time: 06/21/15  6:33 PM  Result Value Ref Range   Glucose-Capillary 99 65 - 99 mg/dL  Troponin I (serum)     Status: Abnormal   Collection Time: 06/21/15  6:53 PM  Result Value  Ref Range   Troponin I 7.82 (HH) <0.031 ng/mL    Comment:        POSSIBLE MYOCARDIAL ISCHEMIA. SERIAL TESTING RECOMMENDED. CRITICAL RESULT CALLED TO, READ BACK BY AND VERIFIED WITH: T SCOTT,RN 1950 06/21/15 D BRADLEY   Glucose, capillary     Status: Abnormal   Collection Time: 06/21/15  9:24 PM  Result Value Ref Range   Glucose-Capillary 174 (H) 65 - 99 mg/dL  Lipid panel     Status: Abnormal   Collection Time: 06/22/15 12:33 AM  Result Value Ref Range   Cholesterol 152 0 - 200 mg/dL   Triglycerides 137 <150 mg/dL   HDL 30 (L) >40 mg/dL   Total CHOL/HDL Ratio 5.1 RATIO   VLDL 27 0 - 40 mg/dL   LDL Cholesterol 95 0 - 99 mg/dL    Comment:        Total Cholesterol/HDL:CHD Risk Coronary Heart Disease Risk Table                     Men   Women  1/2 Average Risk   3.4   3.3  Average Risk       5.0   4.4  2 X Average Risk   9.6   7.1  3 X Average Risk  23.4   11.0        Use the calculated Patient Ratio above and the CHD Risk Table to determine the patient's CHD Risk.        ATP III CLASSIFICATION (LDL):  <100     mg/dL   Optimal  100-129  mg/dL   Near or Above                    Optimal  130-159  mg/dL   Borderline  160-189  mg/dL   High  >190     mg/dL   Very High   Troponin I (serum)     Status: Abnormal   Collection Time: 06/22/15 12:33 AM  Result Value Ref Range   Troponin I 5.51 (HH) <0.031 ng/mL    Comment:        POSSIBLE MYOCARDIAL ISCHEMIA. SERIAL TESTING RECOMMENDED. CRITICAL VALUE NOTED.  VALUE IS CONSISTENT WITH PREVIOUSLY REPORTED AND CALLED VALUE.   Glucose, capillary     Status: Abnormal   Collection Time: 06/22/15  6:28 AM  Result Value Ref Range   Glucose-Capillary 162 (H) 65 - 99 mg/dL   CBC     Status: Abnormal   Collection Time: 06/22/15  6:56 AM  Result Value Ref Range   WBC 8.2 4.0 - 10.5 K/uL   RBC 3.97 3.87 - 5.11 MIL/uL   Hemoglobin 12.0 12.0 - 15.0 g/dL   HCT 35.5 (L) 36.0 - 46.0 %   MCV 89.4 78.0 - 100.0 fL   MCH 30.2 26.0 - 34.0 pg   MCHC 33.8 30.0 - 36.0 g/dL   RDW 12.4 11.5 - 15.5 %   Platelets 211 150 - 400 K/uL  Comprehensive metabolic panel     Status: Abnormal   Collection Time: 06/22/15  6:56 AM  Result Value Ref Range   Sodium 139 135 - 145 mmol/L   Potassium 4.2 3.5 - 5.1 mmol/L   Chloride 108 101 - 111 mmol/L   CO2 21 (L) 22 - 32 mmol/L   Glucose, Bld 165 (H) 65 - 99 mg/dL   BUN 9 6 - 20 mg/dL   Creatinine, Ser 0.84 0.44 - 1.00 mg/dL  Calcium 8.8 (L) 8.9 - 10.3 mg/dL   Total Protein 6.7 6.5 - 8.1 g/dL   Albumin 3.4 (L) 3.5 - 5.0 g/dL   AST 46 (H) 15 - 41 U/L   ALT 25 14 - 54 U/L   Alkaline Phosphatase 64 38 - 126 U/L   Total Bilirubin 0.3 0.3 - 1.2 mg/dL   GFR calc non Af Amer >60 >60 mL/min   GFR calc Af Amer >60 >60 mL/min    Comment: (NOTE) The eGFR has been calculated using the CKD EPI equation. This calculation has not been validated in all clinical situations. eGFR's persistently <60 mL/min signify possible Chronic Kidney Disease.    Anion gap 10 5 - 15  T4, free     Status: None   Collection Time: 06/22/15  6:56 AM  Result Value Ref Range   Free T4 0.87 0.61 - 1.12 ng/dL  Troponin I (serum)     Status: Abnormal   Collection Time: 06/22/15  6:56 AM  Result Value Ref Range   Troponin I 4.35 (HH) <0.031 ng/mL    Comment:        POSSIBLE MYOCARDIAL ISCHEMIA. SERIAL TESTING RECOMMENDED. CRITICAL VALUE NOTED.  VALUE IS CONSISTENT WITH PREVIOUSLY REPORTED AND CALLED VALUE.     Imaging: Dg Chest 2 View  06/21/2015  CLINICAL DATA:  Acute onset of central chest pain. Initial encounter. EXAM: CHEST  2 VIEW COMPARISON:  CTA of the chest performed earlier today at 3:19 a.m. FINDINGS: The lungs are well-aerated and clear. There  is no evidence of focal opacification, pleural effusion or pneumothorax. The heart is normal in size; the mediastinal contour is within normal limits. No acute osseous abnormalities are seen. IMPRESSION: No acute cardiopulmonary process seen. Electronically Signed   By: Garald Balding M.D.   On: 06/21/2015 03:50   Ct Angio Chest Pe W/cm &/or Wo Cm  06/21/2015  CLINICAL DATA:  Acute onset constant chest pressure for 4 hours. Nausea and vomiting, diaphoresis. History of diabetes. EXAM: CT ANGIOGRAPHY CHEST WITH CONTRAST TECHNIQUE: Multidetector CT imaging of the chest was performed using the standard protocol during bolus administration of intravenous contrast. Multiplanar CT image reconstructions and MIPs were obtained to evaluate the vascular anatomy. CONTRAST:  100 cc Isovue 370 COMPARISON:  None. FINDINGS: PULMONARY ARTERY: Adequate contrast opacification of the pulmonary artery's. Main pulmonary artery is not enlarged. No pulmonary arterial filling defects to the level of the subsegmental branches. MEDIASTINUM: Heart and pericardium are unremarkable, no right heart strain. Thoracic aorta is normal course and caliber, unremarkable. No lymphadenopathy by CT size criteria. LUNGS: Tracheobronchial tree is patent, no pneumothorax. No pleural effusions, focal consolidations, pulmonary nodules or masses. SOFT TISSUES AND OSSEOUS STRUCTURES: Hypodense liver compatible with steatosis. Thyromegaly partially imaged with substernal extent. Osseous structures are unremarkable. Review of the MIP images confirms the above findings. IMPRESSION: No acute pulmonary embolism or acute cardiopulmonary process. Electronically Signed   By: Elon Alas M.D.   On: 06/21/2015 03:39    Assessment:  1. Principal Problem: 2.   Chest pain, atypical 3. Active Problems: 4.   Type 2 diabetes with complication (HCC) 5.   Tobacco abuse 6.   Chest pain 7.   STEMI (ST elevation myocardial infarction) ([place]) 8.   Obesity 9.    Abnormal thyroid function test 10.   Elevated troponin 11.   Non-ST elevation MI (NSTEMI) ([place]) 12.   Plan:  1. Recurrent angina - similar pain to her pain yesterday, s/p anterior MI and mid-LAD  stent. Reviewed the films - appears to be thrombus, dissection - but no clear distal LAD findings to support this. Pain could be coronary spasm or distal embolization - given the recurrence x 2 (improved with nitroglycerin) - would recommend re-look catheterization. Could then treat with long-acting nitrate. Give zofran now for nausea. Notified the cath lab that I would prefer her to have repeat cath sooner this morning.  Time Spent Directly with Patient:  15 minutes  Length of Stay:  LOS: 1 day   Pixie Casino, MD, [facility] Attending Cardiologist [place] 06/22/2015, 8:45 AM

## 2015-06-22 NOTE — Progress Notes (Signed)
[facility] PROGRESS NOTE  Jennifer Cervantes [id number] DOB: Sep 13, 1980 DOA: 06/21/2015   PCP: Jeffie Pollock, MD     Assessment/Plan: Principal Problem:   Chest pain, atypical Active Problems:   Type 2 diabetes with complication (HCC)   Tobacco abuse   Chest pain   STEMI (ST elevation myocardial infarction) (HCC)   Obesity   Abnormal thyroid function test   Elevated troponin   Non-ST elevation MI (NSTEMI) (HCC)   Jennifer Cervantes is a 35 y.o. female with medical history significant of DM2, obesity, tobacco abuse, an RN at [employer], presented to the ER from the floor at the urges of her colleagues, as she has retrosternal chest pain, worse with lying down. She also had some diaphoresis, and nausea, but no fever, chills, coughs, black or bloody stool. She reportedly had lost a grandmother whom she is close. Evaluation in the ER included an EKG with PR depression, but no ST elevation, elevated D Dimmer with negative CTPA, and troponin of 0.06. Her CXR was clear. She has a CBG of 178, and normal Cr. Her BP was 102/70. Given her risk factors, hospitalist was asked to admit her for cardiac r/out. Of note, she was off her metformin for a year, and her HA1C was 10 about 2 weeks ago, and she was restarted on Metformin.   Assessment and plan Chest pain, With recurrent angina-Non-ST elevation MI - Symptoms were nonexertional and she has never had exertional chest pain - However, symptoms improved with nitroglycerin and her troponin is mildly elevated - ECG is not a STEMI, but there appears to be mild ST elevation in multiple leads Cardiac cath Showed 99% thrombus filled stenosis in the mid LAD,angioplasty and stenting of the mid to proximal LAD,Significant left ventricular dysfunction with anteroapical severe hypokinesis and EF 35% Further recommendations per cardiology  Aspirin and Brilinta  Continue bivalirudin times an additional 30-45 minutes  Repeat  echocardiogram in 48 hours to reassess anteroapical wall motion     DM (diabetes mellitus) (HCC)-Continue sliding scale insulin hemoglobin A1c pending  Hypertension-continue lisinopril, Coreg, Imdur,   Tobacco abuse-Nicotine cessation counseling done  Dyslipidemia continue statin   DVT prophylaxsis Aspirin and Brilinta  Code Status:  Full code     Family Communication: Discussed in detail with the patient, all imaging results, lab results explained to the patient   Disposition Plan: Disposition per cardiology     Consultants:  Cardiology  Procedures:  Cardiac angiography results as above  Antibiotics: Anti-infectives    None         HPI/Subjective: Chest pain-free this moment  Objective: Filed Vitals:   06/22/15 1030 06/22/15 1035 06/22/15 1040 06/22/15 1211  BP: 102/67 108/70 110/71 104/59  Pulse: 74 81 80 78  Temp:    98.4 F (36.9 C)  TempSrc:    Oral  Resp: 20 20 20 20   Height:      Weight:      SpO2: 99% 100% 100% 100%    Intake/Output Summary (Last 24 hours) at 06/22/15 1307 Last data filed at 06/22/15 0950  Gross per 24 hour  Intake 2596.46 ml  Output    100 ml  Net 2496.46 ml    Exam:  Examination:  General exam: Appears calm and comfortable  Respiratory system: Clear to auscultation. Respiratory effort normal. Cardiovascular system: S1 & S2 heard, RRR. No JVD, murmurs, rubs, gallops or clicks. No pedal edema. Gastrointestinal system: Abdomen is nondistended, soft and nontender. No organomegaly or  masses felt. Normal bowel sounds heard. Central nervous system: Alert and oriented. No focal neurological deficits. Extremities: Symmetric 5 x 5 power. Skin: No rashes, lesions or ulcers Psychiatry: Judgement and insight appear normal. Mood & affect appropriate.     Data Reviewed: I have personally reviewed following labs and imaging studies  Micro Results No results found for this or any previous visit (from the past 240  hour(s)).  Radiology Reports Dg Chest 2 View  06/21/2015  CLINICAL DATA:  Acute onset of central chest pain. Initial encounter. EXAM: CHEST  2 VIEW COMPARISON:  CTA of the chest performed earlier today at 3:19 a.m. FINDINGS: The lungs are well-aerated and clear. There is no evidence of focal opacification, pleural effusion or pneumothorax. The heart is normal in size; the mediastinal contour is within normal limits. No acute osseous abnormalities are seen. IMPRESSION: No acute cardiopulmonary process seen. Electronically Signed   By: Garald Balding M.D.   On: 06/21/2015 03:50   Ct Angio Chest Pe W/cm &/or Wo Cm  06/21/2015  CLINICAL DATA:  Acute onset constant chest pressure for 4 hours. Nausea and vomiting, diaphoresis. History of diabetes. EXAM: CT ANGIOGRAPHY CHEST WITH CONTRAST TECHNIQUE: Multidetector CT imaging of the chest was performed using the standard protocol during bolus administration of intravenous contrast. Multiplanar CT image reconstructions and MIPs were obtained to evaluate the vascular anatomy. CONTRAST:  100 cc Isovue 370 COMPARISON:  None. FINDINGS: PULMONARY ARTERY: Adequate contrast opacification of the pulmonary artery's. Main pulmonary artery is not enlarged. No pulmonary arterial filling defects to the level of the subsegmental branches. MEDIASTINUM: Heart and pericardium are unremarkable, no right heart strain. Thoracic aorta is normal course and caliber, unremarkable. No lymphadenopathy by CT size criteria. LUNGS: Tracheobronchial tree is patent, no pneumothorax. No pleural effusions, focal consolidations, pulmonary nodules or masses. SOFT TISSUES AND OSSEOUS STRUCTURES: Hypodense liver compatible with steatosis. Thyromegaly partially imaged with substernal extent. Osseous structures are unremarkable. Review of the MIP images confirms the above findings. IMPRESSION: No acute pulmonary embolism or acute cardiopulmonary process. Electronically Signed   By: Elon Alas M.D.    On: 06/21/2015 03:39     CBC  Recent Labs Lab 06/21/15 0149 06/22/15 0656  WBC 12.4* 8.2  HGB 15.4* 12.0  HCT 44.2 35.5*  PLT 215 211  MCV 89.1 89.4  MCH 31.0 30.2  MCHC 34.8 33.8  RDW 12.3 12.4  LYMPHSABS 3.1  --   MONOABS 0.5  --   EOSABS 0.1  --   BASOSABS 0.0  --     Chemistries   Recent Labs Lab 06/21/15 0225 06/22/15 0656  NA 139 139  K 3.9 4.2  CL 105 108  CO2 20* 21*  GLUCOSE 178* 165*  BUN 13 9  CREATININE 1.00 0.84  CALCIUM 9.5 8.8*  AST  --  46*  ALT  --  25  ALKPHOS  --  64  BILITOT  --  0.3   ------------------------------------------------------------------------------------------------------------------ estimated creatinine clearance is 121.6 mL/min (by C-G formula based on Cr of 0.84). ------------------------------------------------------------------------------------------------------------------ No results for input(s): HGBA1C in the last 72 hours. ------------------------------------------------------------------------------------------------------------------  Recent Labs  06/22/15 0033  CHOL 152  HDL 30*  LDLCALC 95  TRIG 137  CHOLHDL 5.1   ------------------------------------------------------------------------------------------------------------------  Recent Labs  06/21/15 0532  TSH 0.184*   ------------------------------------------------------------------------------------------------------------------ No results for input(s): VITAMINB12, FOLATE, FERRITIN, TIBC, IRON, RETICCTPCT in the last 72 hours.  Coagulation profile  Recent Labs Lab 06/21/15 1238  INR 1.17     Recent  Labs  06/21/15 0225  DDIMER 0.61*    Cardiac Enzymes  Recent Labs Lab 06/21/15 1853 06/22/15 0033 06/22/15 0656  TROPONINI 7.82* 5.51* 4.35*   ------------------------------------------------------------------------------------------------------------------ Invalid input(s): POCBNP   CBG:  Recent Labs Lab 06/21/15 0759  06/21/15 1229 06/21/15 1833 06/21/15 2124 06/22/15 0628  GLUCAP 216* 175* 99 174* 162*       Studies: Dg Chest 2 View  06/21/2015  CLINICAL DATA:  Acute onset of central chest pain. Initial encounter. EXAM: CHEST  2 VIEW COMPARISON:  CTA of the chest performed earlier today at 3:19 a.m. FINDINGS: The lungs are well-aerated and clear. There is no evidence of focal opacification, pleural effusion or pneumothorax. The heart is normal in size; the mediastinal contour is within normal limits. No acute osseous abnormalities are seen. IMPRESSION: No acute cardiopulmonary process seen. Electronically Signed   By: Garald Balding M.D.   On: 06/21/2015 03:50   Ct Angio Chest Pe W/cm &/or Wo Cm  06/21/2015  CLINICAL DATA:  Acute onset constant chest pressure for 4 hours. Nausea and vomiting, diaphoresis. History of diabetes. EXAM: CT ANGIOGRAPHY CHEST WITH CONTRAST TECHNIQUE: Multidetector CT imaging of the chest was performed using the standard protocol during bolus administration of intravenous contrast. Multiplanar CT image reconstructions and MIPs were obtained to evaluate the vascular anatomy. CONTRAST:  100 cc Isovue 370 COMPARISON:  None. FINDINGS: PULMONARY ARTERY: Adequate contrast opacification of the pulmonary artery's. Main pulmonary artery is not enlarged. No pulmonary arterial filling defects to the level of the subsegmental branches. MEDIASTINUM: Heart and pericardium are unremarkable, no right heart strain. Thoracic aorta is normal course and caliber, unremarkable. No lymphadenopathy by CT size criteria. LUNGS: Tracheobronchial tree is patent, no pneumothorax. No pleural effusions, focal consolidations, pulmonary nodules or masses. SOFT TISSUES AND OSSEOUS STRUCTURES: Hypodense liver compatible with steatosis. Thyromegaly partially imaged with substernal extent. Osseous structures are unremarkable. Review of the MIP images confirms the above findings. IMPRESSION: No acute pulmonary embolism or  acute cardiopulmonary process. Electronically Signed   By: Elon Alas M.D.   On: 06/21/2015 03:39      No results found for: HGBA1C Lab Results  Component Value Date   LDLCALC 95 06/22/2015   CREATININE 0.84 06/22/2015       Scheduled Meds: . aspirin EC  81 mg Oral Daily  . atorvastatin  80 mg Oral q1800  . carvedilol  3.125 mg Oral BID WC  . insulin aspart  0-15 Units Subcutaneous TID WC  . insulin aspart  0-5 Units Subcutaneous QHS  . isosorbide mononitrate  15 mg Oral Daily  . lisinopril  2.5 mg Oral Daily  . sodium chloride flush  3 mL Intravenous Q12H  . sodium chloride flush  3 mL Intravenous Q12H  . sodium chloride flush  3 mL Intravenous Q12H  . ticagrelor  90 mg Oral BID   Continuous Infusions: . sodium chloride 50 mL/hr at 06/22/15 0200  . sodium chloride 1 mL/kg/hr (06/22/15 1100)     LOS: 1 day    Time spent: >30 MINS    [facility] Pager [phone]. If 7PM-7AM, please contact night-coverage at [email or web address], password [facility] 06/22/2015, 1:07 PM  LOS: 1 day

## 2015-06-23 ENCOUNTER — Inpatient Hospital Stay (HOSPITAL_COMMUNITY): Payer: 59

## 2015-06-23 DIAGNOSIS — R52 Pain, unspecified: Secondary | ICD-10-CM

## 2015-06-23 DIAGNOSIS — E118 Type 2 diabetes mellitus with unspecified complications: Secondary | ICD-10-CM

## 2015-06-23 LAB — TROPONIN I
Troponin I: 2.08 ng/mL (ref <0.031)
Troponin I: 2.33 ng/mL
Troponin I: 2.5 ng/mL (ref <0.031)
Troponin I: 2.29 ng/mL

## 2015-06-23 LAB — HEMOGLOBIN A1C
HEMOGLOBIN A1C: 9.1 % — AB (ref 4.8–5.6)
Mean Plasma Glucose: 214 mg/dL

## 2015-06-23 LAB — CBC
HCT: 34.4 % — ABNORMAL LOW (ref 36.0–46.0)
Hemoglobin: 11.3 g/dL — ABNORMAL LOW (ref 12.0–15.0)
MCH: 30 pg (ref 26.0–34.0)
MCHC: 32.8 g/dL (ref 30.0–36.0)
MCV: 91.2 fL (ref 78.0–100.0)
Platelets: 198 K/uL (ref 150–400)
RBC: 3.77 MIL/uL — ABNORMAL LOW (ref 3.87–5.11)
RDW: 12.5 % (ref 11.5–15.5)
WBC: 8.1 K/uL (ref 4.0–10.5)

## 2015-06-23 LAB — GLUCOSE, CAPILLARY
GLUCOSE-CAPILLARY: 155 mg/dL — AB (ref 65–99)
GLUCOSE-CAPILLARY: 120 mg/dL — AB (ref 65–99)
Glucose-Capillary: 139 mg/dL — ABNORMAL HIGH (ref 65–99)
Glucose-Capillary: 150 mg/dL — ABNORMAL HIGH (ref 65–99)

## 2015-06-23 LAB — T4, FREE: Free T4: 0.86 ng/dL (ref 0.61–1.12)

## 2015-06-23 MED ORDER — SODIUM CHLORIDE 0.9 % IV BOLUS (SEPSIS)
250.0000 mL | Freq: Once | INTRAVENOUS | Status: AC
  Administered 2015-06-23: 09:00:00 250 mL via INTRAVENOUS

## 2015-06-23 MED ORDER — LISINOPRIL 2.5 MG PO TABS: 2.5000 mg | ORAL_TABLET | Freq: Every day | ORAL | Status: DC

## 2015-06-23 MED ORDER — CARVEDILOL 3.125 MG PO TABS
3.1250 mg | ORAL_TABLET | Freq: Two times a day (BID) | ORAL | Status: DC
  Administered 2015-06-24 – 2015-06-25 (×4): 3.125 mg via ORAL
  Filled 2015-06-23 (×4): qty 1

## 2015-06-23 MED ORDER — ATORVASTATIN CALCIUM 80 MG PO TABS
80.0000 mg | ORAL_TABLET | Freq: Every day | ORAL | Status: DC
  Administered 2015-06-24 – 2015-06-25 (×2): 80 mg via ORAL
  Filled 2015-06-23 (×2): qty 1

## 2015-06-23 NOTE — Progress Notes (Signed)
CARDIAC REHAB PHASE I   Pt receiving bolus for low BP, hold ambulation for now per RN. Completed MI/stent education.  Reviewed risk factors, tobacco cessation (gave pt fake cigarette), anti-platelet therapy, stent card, activity restrictions, ntg, exercise, heart healthy diet, carb counting, portion control, and phase 2 cardiac rehab. Pt verbalized understanding, fairly receptive to education. Pt agrees to phase 2 cardiac rehab referral, will send to [facility] per pt request. Pt will need to ambulate with staff prior to discharge. Pt in bed, call bell within reach.   IN:[id number] Lenna Sciara, RN, BSN 06/23/2015 9:54 AM

## 2015-06-23 NOTE — Progress Notes (Signed)
Pt c/o chest soreness. Pt rates chest soreness 4 out of 10. Pt given toradol. EKG performed. Troponin resulted at 2.5. PA notified. Will continue to closely monitor.    Ruben Reason, RN

## 2015-06-23 NOTE — Progress Notes (Addendum)
VASCULAR LAB PRELIMINARY  PRELIMINARY  PRELIMINARY  PRELIMINARY  Right upper extremity arterial duplex completed.    Preliminary report:  There is an abnormal waveform noted in the right radial artery at stick site to mid forearm. There is a "thump" noted at stick site. Cannot rule out thrombus. There is adequate flow to the palm.  Brachial and ulnar arteries appear normal.    Gave report to Mickel Baas, Therapist, sports.  Yomaris Palecek, RVT 06/23/2015, 11:05 AM

## 2015-06-23 NOTE — Progress Notes (Addendum)
   Daily Progress Note  Briefly saw this patient.  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  Unclear why forearm is swollen.  Venous duplex order to evaluate for possible DVT.  Hand grip is weakened.  - Normally would take this patient to OR for thrombectomy of right radial artery but patient is on [place] - Per Dr. Radford Pax: the patient has two stents in her heart.  Stopping ASA/Brillinta will likely result in occlusion of her LAD stent, so this would not be an option - Post-operative bleeding on Brillinta is somewhat notorious, so unfortunately, the patient is not a candidate for thrombectomy at this time - Complete consult to follow.    Adele Barthel, MD [facility] Office: [phone] Pager: [phone]  06/23/2015, 1:12 PM

## 2015-06-23 NOTE — Progress Notes (Signed)
Discuss results of right radial doppler with Dr. Bridgett Larsson from Vascular surgery and he will see the patient in consultation.

## 2015-06-23 NOTE — Care Management Note (Signed)
Case Management Note  Patient Details  Name: Jennifer Cervantes MRN: [id number] Date of Birth: [birth year]  Subjective/Objective:    Patient is from home, pta indep.  Patient asked about getting glucometer and strips, NCM gave patient reli-on brochure glucometer for $16.00 and 50 strips for $9 at [facility], also gave her the [facility] phone number to help her find PCP in network.  Patient states she [facility]. NCM gave patient 30 day savings card for Brilinta.   Per benefit check:  4/28- [id number]- [id number]   BRILINTA 90 MG BID DAILY 30)   COVER-YES  CO- PAY $ 18.00 WITH COUPON               Action/Plan:   Expected Discharge Date:                  Expected Discharge Plan:  Acute to Acute Transfer  In-House Referral:  NA  Discharge planning Services  NA  Post Acute Care Choice:  NA Choice offered to:  NA  DME Arranged:  N/A DME Agency:  NA  HH Arranged:  NA HH Agency:     Status of Service:  Completed, signed off  Medicare Important Message Given:    Date Medicare IM Given:    Medicare IM give by:    Date Additional Medicare IM Given:    Additional Medicare Important Message give by:     If discussed at [place] of Stay Meetings, dates discussed:    Additional Comments:  Zenon Mayo, RN 06/23/2015, 3:58 AM

## 2015-06-23 NOTE — Progress Notes (Signed)
Patient had recurrent chest pain this afternoon. Review of test the patient's record and EKG with Dr. Wynonia Lawman, she continued to have evolving T-wave inversion in the anterolateral leads. Her chest pain is very atypical, worse on palpation. Would not activate cath lab at this time. As a side note, her trop has been going up down up down several times, there is no obvious trend at this time.   Hilbert Corrigan PA Pager: [phone]

## 2015-06-23 NOTE — Progress Notes (Signed)
Patient Name: Jennifer Cervantes Date of Encounter: 06/23/2015  Principal Problem:   Non-ST elevation MI (NSTEMI) [facility]) Active Problems:   STEMI (ST elevation myocardial infarction) ([place])   Chest pain, atypical   Type 2 diabetes with complication (HCC)   Tobacco abuse   Chest pain   Obesity   Abnormal thyroid function test   Elevated troponin   Primary Cardiologist: Dr Bronson Ing Patient Profile: 35 y.o. year old female with a history of DM, obesity, tobacco use was admitted to [facility] 04/27 with chest pain>>NSTEMI>>tx to [facility]. Cath with mLAD DES  SUBJECTIVE: Still w/ nausea, unable to eat. R wrist very sore and tender.   OBJECTIVE Filed Vitals:   06/22/15 1211 06/22/15 1437 06/22/15 1942 06/23/15 0427  BP: 104/59 105/57 102/57 99/48  Pulse: 78 71 76 69  Temp: 98.4 F (36.9 C) 98.6 F (37 C) 98.3 F (36.8 C) 97.3 F (36.3 C)  TempSrc: Oral Oral Oral Oral  Resp: 20 18 14 12   Height:      Weight:    241 lb 12.8 oz (109.68 kg)  SpO2: 100%  98% 100%    Intake/Output Summary (Last 24 hours) at 06/23/15 0847 Last data filed at 06/23/15 0502  Gross per 24 hour  Intake 1326.07 ml  Output    200 ml  Net 1126.07 ml   Filed Weights   06/21/15 0059 06/21/15 0540 06/23/15 0427  Weight: 240 lb (108.863 kg) 239 lb 8 oz (108.636 kg) 241 lb 12.8 oz (109.68 kg)    PHYSICAL EXAM General: Well developed, well nourished, female in no acute distress. Head: Normocephalic, atraumatic.  Neck: Supple without bruits, JVD not elevated. Lungs:  Resp regular and unlabored, CTA. Heart: RRR, S1, S2, no S3, S4, or murmur; no rub. Abdomen: Soft, non-tender, non-distended, BS + x 4.  Extremities: No clubbing, cyanosis. R radial pulse not palpable, Ulnar pulse present. Mild edema R wrist only. Neuro: Alert and oriented X 3. Moves all extremities spontaneously. Psych: Normal affect.  LABS: CBC:  Recent Labs  06/21/15 0149 06/22/15 0656 06/23/15 0540  WBC 12.4* 8.2 8.1  NEUTROABS  8.7*  --   --   HGB 15.4* 12.0 11.3*  HCT 44.2 35.5* 34.4*  MCV 89.1 89.4 91.2  PLT 215 211 198   INR:  Recent Labs  06/21/15 1238  INR [id number]   Basic Metabolic Panel:  Recent Labs  06/21/15 0225 06/22/15 0656  NA 139 139  K 3.9 4.2  CL 105 108  CO2 20* 21*  GLUCOSE 178* 165*  BUN 13 9  CREATININE 1.00 0.84  CALCIUM 9.5 8.8*   Liver Function Tests:  Recent Labs  06/22/15 0656  AST 46*  ALT 25  ALKPHOS 64  BILITOT 0.3  PROT 6.7  ALBUMIN 3.4*   Cardiac Enzymes:  Recent Labs  06/22/15 1805 06/23/15 0028 06/23/15 0540  TROPONINI 2.99* 2.08* 2.33*   D-dimer:  Recent Labs  06/21/15 0225  DDIMER 0.61*   Hemoglobin A1C:  Recent Labs  06/22/15 0033  HGBA1C 9.1*   Fasting Lipid Panel:  Recent Labs  06/22/15 0033  CHOL 152  HDL 30*  LDLCALC 95  TRIG 137  CHOLHDL 5.1   Thyroid Function Tests:  Recent Labs  06/21/15 0532  TSH 0.184*   Lab Results  Component Value Date   HGBA1C 9.1* 06/22/2015    TELE:   SR, ST     Cath: 04/28  Mid Cx to Dist Cx lesion, 75% stenosed.  Mid LAD to Dist LAD lesion, 10% stenosed. The lesion was previously treated with a drug-eluting stent.  Mid RCA lesion, 40% stenosed. The LAD stented segment is widely patent. No evidence of thrombus, distal embolization, or dissection. Side branches are intact. Other vessels are unchanged. Plan: Medical management.  Radiology/Studies: No results found.   Current Medications:  . aspirin EC  81 mg Oral Daily  . atorvastatin  80 mg Oral q1800  . carvedilol  3.125 mg Oral BID WC  . insulin aspart  0-15 Units Subcutaneous TID WC  . insulin aspart  0-5 Units Subcutaneous QHS  . isosorbide mononitrate  15 mg Oral Daily  . lisinopril  2.5 mg Oral Daily  . sodium chloride flush  3 mL Intravenous Q12H  . sodium chloride flush  3 mL Intravenous Q12H  . sodium chloride flush  3 mL Intravenous Q12H  . ticagrelor  90 mg Oral BID  . Vitamin D (Ergocalciferol)  50,000  Units Oral Q7 days   . sodium chloride 50 mL/hr at 06/22/15 0200    ASSESSMENT AND PLAN: Principal Problem:   Non-ST elevation MI (NSTEMI) ([place]) - s/p DES LAD - still with N&V, hydrate and follow - with low BP, holding meds except ASA/Brilinta  - once sx improve, restart rx one at a time    Weak/nonpalpable R radial pulse - ck dopplers, hopefully all due to edema - rx symptoms  Otherwise, continue current therapy, keep 1 more day. Active Problems:   STEMI (ST elevation myocardial infarction) ([place])   Chest pain, atypical   Type 2 diabetes with complication (HCC)   Tobacco abuse   Chest pain   Obesity   Abnormal thyroid function test   Elevated troponin   Jonetta Speak , PA-C 8:47 AM 06/23/2015

## 2015-06-24 ENCOUNTER — Inpatient Hospital Stay (HOSPITAL_COMMUNITY): Payer: 59

## 2015-06-24 DIAGNOSIS — R7989 Other specified abnormal findings of blood chemistry: Secondary | ICD-10-CM

## 2015-06-24 DIAGNOSIS — I742 Embolism and thrombosis of arteries of the upper extremities: Secondary | ICD-10-CM

## 2015-06-24 DIAGNOSIS — R52 Pain, unspecified: Secondary | ICD-10-CM

## 2015-06-24 LAB — BASIC METABOLIC PANEL
Anion gap: 9 (ref 5–15)
BUN: 12 mg/dL (ref 6–20)
CHLORIDE: 112 mmol/L — AB (ref 101–111)
CO2: 19 mmol/L — AB (ref 22–32)
CREATININE: 0.72 mg/dL (ref 0.44–1.00)
Calcium: 9 mg/dL (ref 8.9–10.3)
GFR calc Af Amer: >60 mL/min (ref >60)
GFR calc non Af Amer: >60 mL/min (ref >60)
Glucose, Bld: 156 mg/dL — ABNORMAL HIGH (ref 65–99)
POTASSIUM: 4.7 mmol/L (ref 3.5–5.1)
SODIUM: 140 mmol/L (ref 135–145)

## 2015-06-24 LAB — TROPONIN I
TROPONIN I: 1.96 ng/mL — AB (ref <0.031)
Troponin I: 2.33 ng/mL (ref <0.031)
Troponin I: 1.73 ng/mL (ref <0.031)
Troponin I: 1.9 ng/mL (ref <0.031)

## 2015-06-24 LAB — GLUCOSE, CAPILLARY
GLUCOSE-CAPILLARY: 147 mg/dL — AB (ref 65–99)
GLUCOSE-CAPILLARY: 132 mg/dL — AB (ref 65–99)
Glucose-Capillary: 217 mg/dL — ABNORMAL HIGH (ref 65–99)
Glucose-Capillary: 111 mg/dL — ABNORMAL HIGH (ref 65–99)

## 2015-06-24 LAB — CBC
HEMATOCRIT: 33.4 % — AB (ref 36.0–46.0)
HEMOGLOBIN: 11.3 g/dL — AB (ref 12.0–15.0)
MCH: 30.2 pg (ref 26.0–34.0)
MCHC: 33.8 g/dL (ref 30.0–36.0)
MCV: 89.3 fL (ref 78.0–100.0)
Platelets: 217 10*3/uL (ref 150–400)
RBC: 3.74 MIL/uL — AB (ref 3.87–5.11)
RDW: 12.5 % (ref 11.5–15.5)
WBC: 6.7 10*3/uL (ref 4.0–10.5)

## 2015-06-24 LAB — T3, FREE: T3 FREE: 2.7 pg/mL (ref 2.0–4.4)

## 2015-06-24 MED ORDER — MAGNESIUM HYDROXIDE 400 MG/5ML PO SUSP
30.0000 mL | Freq: Every day | ORAL | Status: DC | PRN
  Administered 2015-06-24 – 2015-06-25 (×2): 30 mL via ORAL
  Filled 2015-06-24 (×4): qty 30

## 2015-06-24 MED ORDER — KETOROLAC TROMETHAMINE 30 MG/ML IJ SOLN
30.0000 mg | Freq: Three times a day (TID) | INTRAMUSCULAR | Status: DC | PRN
  Administered 2015-06-24: 30 mg via INTRAVENOUS
  Filled 2015-06-24: qty 1

## 2015-06-24 NOTE — Progress Notes (Signed)
VASCULAR LAB PRELIMINARY  PRELIMINARY  PRELIMINARY  PRELIMINARY  Right upper extremity venous duplex completed.    Preliminary report:  There is no DVT or SVT noted in the right upper extremity.   Azadeh Hyder, RVT 06/24/2015, 7:05 PM

## 2015-06-24 NOTE — Progress Notes (Signed)
SUBJECTIVE:  Has some pleuritic CP  OBJECTIVE:   Vitals:   Filed Vitals:   06/23/15 2000 06/23/15 2354 06/24/15 0500 06/24/15 0800  BP: 117/64 115/66 107/64 110/69  Pulse: 73 75 68 69  Temp: 98 F (36.7 C) 97.7 F (36.5 C) 98 F (36.7 C) 97.8 F (36.6 C)  TempSrc:    Oral  Resp: 20 21 18 18   Height:      Weight:   245 lb 9.6 oz (111.403 kg)   SpO2: 97% 100% 99% 100%   I&O's:   Intake/Output Summary (Last 24 hours) at 06/24/15 0824 Last data filed at 06/24/15 0500  Gross per 24 hour  Intake   1255 ml  Output    450 ml  Net    805 ml   TELEMETRY: Reviewed telemetry pt in NSR:     PHYSICAL EXAM General: Well developed, well nourished, in no acute distress Head: Eyes PERRLA, No xanthomas.   Normal cephalic and atramatic  Lungs:   Clear bilaterally to auscultation and percussion. Heart:   HRRR S1 S2 Pulses are 2+ & equal. Abdomen: Bowel sounds are positive, abdomen soft and non-tender without masses Extremities:   No clubbing, cyanosis or edema.  DP +1.  Right forearm swollen into hand but soft.  No palpable radial pulse.  Moving first 3 fingers now. Neuro: Alert and oriented X 3. Psych:  Good affect, responds appropriately   LABS: Basic Metabolic Panel:  Recent Labs  06/22/15 0656 06/24/15 0647  NA 139 140  K 4.2 4.7  CL 108 112*  CO2 21* 19*  GLUCOSE 165* 156*  BUN 9 12  CREATININE 0.84 0.72  CALCIUM 8.8* 9.0   Liver Function Tests:  Recent Labs  06/22/15 0656  AST 46*  ALT 25  ALKPHOS 64  BILITOT 0.3  PROT 6.7  ALBUMIN 3.4*   No results for input(s): LIPASE, AMYLASE in the last 72 hours. CBC:  Recent Labs  06/23/15 0540 06/24/15 0647  WBC 8.1 6.7  HGB 11.3* 11.3*  HCT 34.4* 33.4*  MCV 91.2 89.3  PLT 198 217   Cardiac Enzymes:  Recent Labs  06/23/15 1808 06/24/15 0020 06/24/15 0647  TROPONINI 2.29* 2.33* 1.96*   BNP: Invalid input(s): POCBNP D-Dimer: No results for input(s): DDIMER in the last 72 hours. Hemoglobin  A1C:  Recent Labs  06/22/15 0033  HGBA1C 9.1*   Fasting Lipid Panel:  Recent Labs  06/22/15 0033  CHOL 152  HDL 30*  LDLCALC 95  TRIG 137  CHOLHDL 5.1   Thyroid Function Tests: No results for input(s): TSH, T4TOTAL, T3FREE, THYROIDAB in the last 72 hours.  Invalid input(s): FREET3 Anemia Panel: No results for input(s): VITAMINB12, FOLATE, FERRITIN, TIBC, IRON, RETICCTPCT in the last 72 hours. Coag Panel:   Lab Results  Component Value Date   INR 1.17 06/21/2015    RADIOLOGY: Dg Chest 2 View  06/21/2015  CLINICAL DATA:  Acute onset of central chest pain. Initial encounter. EXAM: CHEST  2 VIEW COMPARISON:  CTA of the chest performed earlier today at 3:19 a.m. FINDINGS: The lungs are well-aerated and clear. There is no evidence of focal opacification, pleural effusion or pneumothorax. The heart is normal in size; the mediastinal contour is within normal limits. No acute osseous abnormalities are seen. IMPRESSION: No acute cardiopulmonary process seen. Electronically Signed   By: Garald Balding M.D.   On: 06/21/2015 03:50   Ct Angio Chest Pe W/cm &/or Wo Cm  06/21/2015  CLINICAL DATA:  Acute onset constant chest pressure for 4 hours. Nausea and vomiting, diaphoresis. History of diabetes. EXAM: CT ANGIOGRAPHY CHEST WITH CONTRAST TECHNIQUE: Multidetector CT imaging of the chest was performed using the standard protocol during bolus administration of intravenous contrast. Multiplanar CT image reconstructions and MIPs were obtained to evaluate the vascular anatomy. CONTRAST:  100 cc Isovue 370 COMPARISON:  None. FINDINGS: PULMONARY ARTERY: Adequate contrast opacification of the pulmonary artery's. Main pulmonary artery is not enlarged. No pulmonary arterial filling defects to the level of the subsegmental branches. MEDIASTINUM: Heart and pericardium are unremarkable, no right heart strain. Thoracic aorta is normal course and caliber, unremarkable. No lymphadenopathy by CT size criteria.  LUNGS: Tracheobronchial tree is patent, no pneumothorax. No pleural effusions, focal consolidations, pulmonary nodules or masses. SOFT TISSUES AND OSSEOUS STRUCTURES: Hypodense liver compatible with steatosis. Thyromegaly partially imaged with substernal extent. Osseous structures are unremarkable. Review of the MIP images confirms the above findings. IMPRESSION: No acute pulmonary embolism or acute cardiopulmonary process. Electronically Signed   By: Elon Alas M.D.   On: 06/21/2015 03:39    ASSESSMENT AND PLAN: Principal Problem:  Non-ST elevation MI (NSTEMI) ([place]) - s/p DES LAD - had episode of CP yesterday but trop has continued to trend downward.  CP is pleuritic and occurs with deep breathing.  Will check 2D echo to see if she has pericardial effusion post MI - BP stable on low dose BB/Imdur/ACE I but soft and she is having dizziness when ambulating. Will d/c ACE I for now..  Continue ASA and Brilinta.     Weak/nonpalpable R radial pulse, weak hand grip with swollen forearm ? Hematoma from arterial bleed.  Forearm better today and can now move fingers.  Less swollen and arm is soft.  No evidence of compartment syndrome.  - arterial dopplers c/w radial artery occlusion.   - Appreciated Vascular surgery input - For venous duplex to rule out DVT to try to explain forearm swelling.  Discussed with Vascular risk/benefit of coming off DAPT for surgery.  She just had 2 stents placed in LAD in setting of STEMI so high risk for acute stent thrombosis if off DAPT.  Consider change of Brilinta to Plavix if she has recurrent bleeding.   Otherwise, continue current therapy, keep 1 more day. Active Problems:  STEMI (ST elevation myocardial infarction) ([place])  Chest pain, atypical  Type 2 diabetes with complication (HCC)  Tobacco abuse  Chest pain  Obesity  Abnormal thyroid function test  Elevated troponin    Sueanne Margarita, MD  06/24/2015  8:24 AM

## 2015-06-24 NOTE — Consult Note (Signed)
Referred by:  Dr. Radford Pax (Cardiology)  Reason for referral: Occluded right radial artery   History of Present Illness  Jennifer Cervantes is a 35 y.o. (05-09-1980) female who presents with chief complaint: right arm swelling with weak right hand.  This patient underwent PCI via R radial artery on 06/21/15 and 06/22/15 for acute coronary syndrome due to near occluded LAD stenosis.  After her second cardiac cath, she notes increasing swelling in her forearm and mild pain and weakness in her right hand.  The patient has been on Aspirin and Brilinta post-PCI.  She notes her weakness is localized to her right hand.  Past Medical History  Diagnosis Date  . Diabetes mellitus without complication [facility])     Past Surgical History  Procedure Laterality Date  . Cardiac catheterization N/A 06/21/2015    Procedure: Left Heart Cath and Coronary Angiography;  Surgeon: Belva Crome, MD;  Location: [place] [facility];  Service: Cardiovascular;  Laterality: N/A;  . Cardiac catheterization N/A 06/21/2015    Procedure: Coronary Stent Intervention;  Surgeon: Belva Crome, MD;  Location: [place] [facility];  Service: Cardiovascular;  Laterality: N/A;  . Cardiac catheterization N/A 06/21/2015    Procedure: Intravascular Ultrasound/IVUS;  Surgeon: Belva Crome, MD;  Location: [place] [facility];  Service: Cardiovascular;  Laterality: N/A;  LAD  . Cardiac catheterization  06/22/2015    Procedure: Coronary/Graft Angiography;  Surgeon: Peter M Martinique, MD;  Location: [place] [facility];  Service: Cardiovascular;;    Social History   Social History  . Marital Status: Single    Spouse Name: N/A  . Number of Children: N/A  . Years of Education: N/A   Occupational History  . Nurse [employer]   Social History Main Topics  . Smoking status: Current Every Day Smoker -- 0.50 packs/day for 23 years  . Smokeless tobacco: Never Used  . Alcohol Use: No  . Drug Use: No  . Sexual Activity: Not on file   Other  Topics Concern  . Not on file   Social History Narrative   Lives in [place], works nights at [employer] History  Problem Relation Age of Onset  . Heart attack Neg Hx   . Heart failure Neg Hx     Current Facility-Administered Medications  Medication Dose Route Frequency Provider Last Rate Last Dose  . 0.9 %  sodium chloride infusion  250 mL Intravenous PRN Peter M Martinique, MD      . acetaminophen (TYLENOL) tablet 650 mg  650 mg Oral Q4H PRN Rexene Alberts, MD   650 mg at 06/23/15 2140  . aspirin EC tablet 81 mg  81 mg Oral Daily Belva Crome, MD   81 mg at 06/23/15 0908  . atorvastatin (LIPITOR) tablet 80 mg  80 mg Oral q1800 Rhonda G Barrett, PA-C      . carvedilol (COREG) tablet 3.125 mg  3.125 mg Oral BID WC Rhonda G Barrett, PA-C   3.125 mg at 06/24/15 0823  . insulin aspart (novoLOG) injection 0-15 Units  0-15 Units Subcutaneous TID WC Orvan Falconer, MD   5 Units at 06/24/15 [phone]  . insulin aspart (novoLOG) injection 0-5 Units  0-5 Units Subcutaneous QHS Orvan Falconer, MD      . isosorbide mononitrate (IMDUR) 24 hr tablet 15 mg  15 mg Oral Daily Pixie Casino, MD   15 mg at 06/22/15 1443  . morphine 2 MG/ML injection 2-4 mg  2-4 mg Intravenous Q1H PRN Evelene Croon Barrett, PA-C   2 mg at 06/22/15 0515  . ondansetron (ZOFRAN) tablet 4 mg  4 mg Oral Q6H PRN Orvan Falconer, MD       Or  . ondansetron [facility]) injection 4 mg  4 mg Intravenous Q6H PRN Orvan Falconer, MD      . ondansetron [facility]) injection 4 mg  4 mg Intravenous Q6H PRN Belva Crome, MD   4 mg at 06/22/15 0505  . oxyCODONE-acetaminophen (PERCOCET/ROXICET) 5-325 MG per tablet 1-2 tablet  1-2 tablet Oral Q4H PRN Belva Crome, MD      . sodium chloride flush (NS) 0.9 % injection 3 mL  3 mL Intravenous Q12H Peter M Martinique, MD   3 mL at 06/23/15 0917  . sodium chloride flush (NS) 0.9 % injection 3 mL  3 mL Intravenous PRN Peter M Martinique, MD      . ticagrelor [facility]) tablet 90 mg  90 mg Oral BID Belva Crome, MD   90 mg at 06/23/15 2140   . Vitamin D (Ergocalciferol) (DRISDOL) capsule 50,000 Units  50,000 Units Oral Q7 days Reyne Dumas, MD   50,000 Units at 06/22/15 1702     No Known Allergies   REVIEW OF SYSTEMS:  (Positives checked otherwise negative)  CARDIOVASCULAR:   [x]  chest pain,  [x]  chest pressure,  [ ]  palpitations,  [ ]  shortness of breath when laying flat,  [x]  shortness of breath with exertion,   [ ]  pain in feet when walking,  [ ]  pain in feet when laying flat, [ ]  history of blood clot in veins (DVT),  [ ]  history of phlebitis,  [ ]  swelling in legs,  [ ]  varicose veins  PULMONARY:   [ ]  productive cough,  [ ]  asthma,  [ ]  wheezing  NEUROLOGIC:   [x]  weakness in arms or legs,  [x]  numbness in arms or legs,  [ ]  difficulty speaking or slurred speech,  [ ]  temporary loss of vision in one eye,  [ ]  dizziness  HEMATOLOGIC:   [ ]  bleeding problems,  [ ]  problems with blood clotting too easily  MUSCULOSKEL:   [ ]  joint pain, [ ]  joint swelling  GASTROINTEST:   [ ]  vomiting blood,  [ ]  blood in stool     GENITOURINARY:   [ ]  burning with urination,  [ ]  blood in urine  PSYCHIATRIC:   [ ]  history of major depression  INTEGUMENTARY:   [ ]  rashes,  [ ]  ulcers  CONSTITUTIONAL:   [ ]  fever,  [ ]  chills   Physical Examination  Filed Vitals:   06/23/15 2000 06/23/15 2354 06/24/15 0500 06/24/15 0800  BP: 117/64 115/66 107/64 110/69  Pulse: 73 75 68 69  Temp: 98 F (36.7 C) 97.7 F (36.5 C) 98 F (36.7 C) 97.8 F (36.6 C)  TempSrc:    Oral  Resp: 20 21 18 18   Height:      Weight:   245 lb 9.6 oz (111.403 kg)   SpO2: 97% 100% 99% 100%   Body mass index is 36.25 kg/(m^2).  General: A&O x 3, WD, Obese,   Head: [place]/AT  Ear/Nose/Throat: Hearing grossly intact, nares w/o erythema or drainage, oropharynx w/o Erythema/Exudate, Mallampati score: 3  Eyes: PERRLA, EOMI  Neck: Supple, no nuchal rigidity, no palpable LAD  Pulmonary: Sym exp, good air movt, CTAB, no rales,  rhonchi, & wheezing  Cardiac: RRR, Nl S1, S2, no Murmurs, rubs  or gallops  Vascular: Vessel Right Left  Radial Palpable distally (improved from yesterday) Palpable  Brachial Palpable Palpable  Carotid Palpable, without bruit Palpable, without bruit  Aorta Not palpable N/A  Femoral Palpable Palpable  Popliteal Not palpable Not palpable  PT Faintly Palpable Faintly Palpable  DP Palpable Palpable   Gastrointestinal: soft, NTND, no G/R, no HSM, no masses, no CVAT B  Musculoskeletal: M/S 5/5 throughout except right hand grip 2-3/5 (improved from yesterday), Extremities without ischemic changes, mild R forearm swelling (improved from yesterday), not tight,   Neurologic: CN 2-12 intact , Pain and light touch intact in extremities except R thumb with somewhat decreased sensation per pt, Motor exam as listed above  Psychiatric: Judgment intact, Mood & affect appropriate for pt's clinical situation  Dermatologic: See M/S exam for extremity exam, no rashes otherwise noted  Lymph : No Cervical, Axillary, or Inguinal lymphadenopathy    Non-Invasive Vascular Imaging  RUE arterial duplex (06/23/15) Preliminary report: There is an abnormal waveform noted in the right radial artery at stick site to mid forearm. There is a "thump" noted at stick site. Cannot rule out thrombus. There is adequate flow to the palm. Brachial and ulnar arteries appear normal.   Laboratory: CBC:    Component Value Date/Time   WBC 6.7 06/24/2015 0647   RBC 3.74* 06/24/2015 0647   HGB 11.3* 06/24/2015 0647   HCT 33.4* 06/24/2015 0647   PLT 217 06/24/2015 0647   MCV 89.3 06/24/2015 0647   MCH 30.2 06/24/2015 0647   MCHC 33.8 06/24/2015 0647   RDW 12.5 06/24/2015 0647   LYMPHSABS 3.1 06/21/2015 0149   MONOABS 0.5 06/21/2015 0149   EOSABS 0.1 06/21/2015 0149   BASOSABS 0.0 06/21/2015 0149    BMP:    Component Value Date/Time   NA 140 06/24/2015 0647   K 4.7 06/24/2015 0647   CL 112* 06/24/2015 0647    CO2 19* 06/24/2015 0647   GLUCOSE 156* 06/24/2015 0647   BUN 12 06/24/2015 0647   CREATININE 0.72 06/24/2015 0647   CALCIUM 9.0 06/24/2015 0647   GFRNONAA >60 06/24/2015 0647   GFRAA >60 06/24/2015 0647    Coagulation: Lab Results  Component Value Date   INR 1.17 06/21/2015   No results found for: PTT  Lipids:    Component Value Date/Time   CHOL 152 06/22/2015 0033   TRIG 137 06/22/2015 0033   HDL 30* 06/22/2015 0033   CHOLHDL 5.1 06/22/2015 0033   VLDL 27 06/22/2015 0033   [place] 95 06/22/2015 0033    Radiology: No results found.   Medical Decision Making  Jennifer Cervantes is a 35 y.o. female who presents with: s/p PCI x 2 complicated by radial artery occlusion.   Suspect radial pulse is due to retrograde flow.  This suggests that her hand perfusion is adequate via her ulnar artery.  Due to the need for continuous ASA and Brilinta to keep the cardiac stents open, this patient is not a good surgical candidate for a thrombectomy.  Bleeding on Brilinta is usually not easily controlled.  Given enough time, usually the ulnar artery will hypertrophy to become the primary inflow artery for the hand.  I am not certain the etiology of the forearm swelling.  I would obtain a venous duplex to evaluate the RUE for DVT, though I doubt it.  It is also possible the forearm swelling is due to procedure complications.   Regardless, I don't think there is enough swelling or bleeding to have caused  a forearm compartment syndrome.  Similarly, I'm not certain why the patient has weakness in the right hand given the presence of a pulse now in the radial artery.    Again both the swelling and weakness are improving, so I would manage this conservatively by keeping the right arm elevated in an exaggerated position, hanging the arm up or keeping it propped on pillows while lying down.  Thank you for allowing [place] to participate in this patient's care.   Adele Barthel, MD [facility] [place] Office: [phone] Pager: [phone]  06/24/2015, 9:30 AM

## 2015-06-25 ENCOUNTER — Inpatient Hospital Stay (HOSPITAL_COMMUNITY): Payer: 59

## 2015-06-25 ENCOUNTER — Encounter (HOSPITAL_COMMUNITY): Payer: Self-pay | Admitting: Physician Assistant

## 2015-06-25 ENCOUNTER — Other Ambulatory Visit: Payer: Self-pay | Admitting: Physician Assistant

## 2015-06-25 ENCOUNTER — Other Ambulatory Visit: Payer: Self-pay | Admitting: *Deleted

## 2015-06-25 DIAGNOSIS — E1165 Type 2 diabetes mellitus with hyperglycemia: Secondary | ICD-10-CM | POA: Diagnosis present

## 2015-06-25 DIAGNOSIS — S5412XA Injury of median nerve at forearm level, left arm, initial encounter: Secondary | ICD-10-CM

## 2015-06-25 DIAGNOSIS — I5189 Other ill-defined heart diseases: Secondary | ICD-10-CM | POA: Diagnosis present

## 2015-06-25 DIAGNOSIS — IMO0002 Reserved for concepts with insufficient information to code with codable children: Secondary | ICD-10-CM | POA: Diagnosis present

## 2015-06-25 DIAGNOSIS — I071 Rheumatic tricuspid insufficiency: Secondary | ICD-10-CM | POA: Diagnosis present

## 2015-06-25 DIAGNOSIS — R29898 Other symptoms and signs involving the musculoskeletal system: Secondary | ICD-10-CM

## 2015-06-25 DIAGNOSIS — I25118 Atherosclerotic heart disease of native coronary artery with other forms of angina pectoris: Secondary | ICD-10-CM

## 2015-06-25 DIAGNOSIS — S5411XA Injury of median nerve at forearm level, right arm, initial encounter: Secondary | ICD-10-CM | POA: Diagnosis present

## 2015-06-25 DIAGNOSIS — E669 Obesity, unspecified: Secondary | ICD-10-CM

## 2015-06-25 DIAGNOSIS — S5411XS Injury of median nerve at forearm level, right arm, sequela: Secondary | ICD-10-CM

## 2015-06-25 DIAGNOSIS — I25119 Atherosclerotic heart disease of native coronary artery with unspecified angina pectoris: Secondary | ICD-10-CM | POA: Diagnosis present

## 2015-06-25 DIAGNOSIS — R079 Chest pain, unspecified: Secondary | ICD-10-CM

## 2015-06-25 DIAGNOSIS — I515 Myocardial degeneration: Secondary | ICD-10-CM

## 2015-06-25 LAB — CBC
HEMATOCRIT: 32.6 % — AB (ref 36.0–46.0)
HEMOGLOBIN: 10.9 g/dL — AB (ref 12.0–15.0)
MCH: 30.2 pg (ref 26.0–34.0)
MCHC: 33.4 g/dL (ref 30.0–36.0)
MCV: 90.3 fL (ref 78.0–100.0)
Platelets: 172 10*3/uL (ref 150–400)
RBC: 3.61 MIL/uL — ABNORMAL LOW (ref 3.87–5.11)
RDW: 12.3 % (ref 11.5–15.5)
WBC: 6.9 10*3/uL (ref 4.0–10.5)

## 2015-06-25 LAB — ECHOCARDIOGRAM COMPLETE
Height: 69 in
WEIGHTICAEL: 3913.6 [oz_av]

## 2015-06-25 LAB — GLUCOSE, CAPILLARY
GLUCOSE-CAPILLARY: 145 mg/dL — AB (ref 65–99)
GLUCOSE-CAPILLARY: 148 mg/dL — AB (ref 65–99)
Glucose-Capillary: 166 mg/dL — ABNORMAL HIGH (ref 65–99)

## 2015-06-25 LAB — TROPONIN I: Troponin I: 1.47 ng/mL (ref <0.031)

## 2015-06-25 MED ORDER — ASPIRIN 81 MG PO TBEC: 81.0000 mg | DELAYED_RELEASE_TABLET | Freq: Every day | ORAL | Status: DC

## 2015-06-25 MED ORDER — METFORMIN HCL 500 MG PO TABS: 1000.0000 mg | ORAL_TABLET | Freq: Two times a day (BID) | ORAL | Status: DC

## 2015-06-25 MED ORDER — ATORVASTATIN CALCIUM 80 MG PO TABS: 80.0000 mg | ORAL_TABLET | Freq: Every day | ORAL | Status: DC

## 2015-06-25 MED ORDER — ISOSORBIDE MONONITRATE ER 30 MG PO TB24: 15.0000 mg | ORAL_TABLET | Freq: Every day | ORAL | Status: DC

## 2015-06-25 MED ORDER — TICAGRELOR 90 MG PO TABS: 90.0000 mg | ORAL_TABLET | Freq: Two times a day (BID) | ORAL | Status: DC

## 2015-06-25 MED ORDER — CARVEDILOL 3.125 MG PO TABS: 3.1250 mg | ORAL_TABLET | Freq: Two times a day (BID) | ORAL | Status: DC

## 2015-06-25 NOTE — Progress Notes (Signed)
CARDIAC REHAB PHASE I   PRE:  Rate/Rhythm: 62 SR    BP: sitting 128/83    SaO2:   MODE:  Ambulation: 1100 ft   POST:  Rate/Rhythm: 100 ST    BP: sitting 130/84     SaO2:   Tolerated well, no c/o. Sts she was dizzy walking yesterday but today she denies dizziness. Ed reviewed and questions answered. She is planning on change. Understands importance of Brilinta, etc. Nervous about quitting smoking.  [place], ACSM 06/25/2015 11:20 AM

## 2015-06-25 NOTE — Progress Notes (Addendum)
   Daily Progress Note  No DVT in R arm.  No evidence of compartment syndrome in R arm.  - Nothing more to add from a surgical viewpoint - Would get a RUE Wrist-brachial index in 3 months to follow the perfusion of this right arm - Available as needed  Adele Barthel, MD [facility] [place] Office: [phone] Pager: [phone]  06/25/2015, 7:54 AM

## 2015-06-25 NOTE — Progress Notes (Signed)
Patient Name: Jennifer Cervantes Date of Encounter: 06/25/2015     Principal Problem:   Non-ST elevation MI (NSTEMI) ([place]) Active Problems:   Tobacco abuse   STEMI (ST elevation myocardial infarction) ([place])   Obesity   Abnormal thyroid function test   Uncontrolled diabetes mellitus ([facility])   CAD (coronary artery disease)   Ischemic cardiomyopathy    SUBJECTIVE  Still with mild chest discomfort, but nothing like before. Still can't move her lateral three fingers.   CURRENT MEDS . aspirin EC  81 mg Oral Daily  . atorvastatin  80 mg Oral q1800  . carvedilol  3.125 mg Oral BID WC  . insulin aspart  0-15 Units Subcutaneous TID WC  . insulin aspart  0-5 Units Subcutaneous QHS  . isosorbide mononitrate  15 mg Oral Daily  . sodium chloride flush  3 mL Intravenous Q12H  . ticagrelor  90 mg Oral BID  . Vitamin D (Ergocalciferol)  50,000 Units Oral Q7 days    OBJECTIVE  Filed Vitals:   06/25/15 0009 06/25/15 0400 06/25/15 0757 06/25/15 1230  BP: 102/52 104/56 112/75 116/76  Pulse: 71 72 74 75  Temp: 97.8 F (36.6 C) 97.6 F (36.4 C) 98 F (36.7 C) 97.8 F (36.6 C)  TempSrc:   Oral Oral  Resp: 21 16 18 19   Height:      Weight:  244 lb 9.6 oz (110.95 kg)    SpO2: 100% 100% 100% 99%    Intake/Output Summary (Last 24 hours) at 06/25/15 1431 Last data filed at 06/24/15 2000  Gross per 24 hour  Intake    480 ml  Output      0 ml  Net    480 ml   Filed Weights   06/23/15 1120 06/24/15 0500 06/25/15 0400  Weight: 243 lb 1.6 oz (110.269 kg) 245 lb 9.6 oz (111.403 kg) 244 lb 9.6 oz (110.95 kg)    PHYSICAL EXAM  General: Pleasant, NAD. Neuro: Alert and oriented X 3. Moves all extremities spontaneously. Psych: Normal affect. HEENT:  Normal  Neck: Supple without bruits or JVD. Lungs:  Resp regular and unlabored, CTA. Heart: RRR no s3, s4, or murmurs. Abdomen: Soft, non-tender, non-distended, BS + x 4.  Extremities: No clubbing, cyanosis or edema. DP/PT/Radials 2+ and  equal bilaterally.  Accessory Clinical Findings  CBC  Recent Labs  06/24/15 0647 06/25/15 0025  WBC 6.7 6.9  HGB 11.3* 10.9*  HCT 33.4* 32.6*  MCV 89.3 90.3  PLT 217 [id number]   Basic Metabolic Panel  Recent Labs  06/24/15 0647  NA 140  K 4.7  CL 112*  CO2 19*  GLUCOSE 156*  BUN 12  CREATININE 0.72  CALCIUM 9.0   Liver Function Tests No results for input(s): AST, ALT, ALKPHOS, BILITOT, PROT, ALBUMIN in the last 72 hours. No results for input(s): LIPASE, AMYLASE in the last 72 hours. Cardiac Enzymes  Recent Labs  06/24/15 1211 06/24/15 1826 06/25/15 0025  TROPONINI 1.73* 1.90* 1.47*   Thyroid Function Tests  Recent Labs  06/23/15 1241  T3FREE 2.7    TELE  NSR with some sinus tachy  Radiology/Studies  Dg Chest 2 View  06/21/2015  CLINICAL DATA:  Acute onset of central chest pain. Initial encounter. EXAM: CHEST  2 VIEW COMPARISON:  CTA of the chest performed earlier today at 3:19 a.m. FINDINGS: The lungs are well-aerated and clear. There is no evidence of focal opacification, pleural effusion or pneumothorax. The heart is normal in size; the mediastinal  contour is within normal limits. No acute osseous abnormalities are seen. IMPRESSION: No acute cardiopulmonary process seen. Electronically Signed   By: Garald Balding M.D.   On: 06/21/2015 03:50   Ct Angio Chest Pe W/cm &/or Wo Cm  06/21/2015  CLINICAL DATA:  Acute onset constant chest pressure for 4 hours. Nausea and vomiting, diaphoresis. History of diabetes. EXAM: CT ANGIOGRAPHY CHEST WITH CONTRAST TECHNIQUE: Multidetector CT imaging of the chest was performed using the standard protocol during bolus administration of intravenous contrast. Multiplanar CT image reconstructions and MIPs were obtained to evaluate the vascular anatomy. CONTRAST:  100 cc Isovue 370 COMPARISON:  None. FINDINGS: PULMONARY ARTERY: Adequate contrast opacification of the pulmonary artery's. Main pulmonary artery is not enlarged. No  pulmonary arterial filling defects to the level of the subsegmental branches. MEDIASTINUM: Heart and pericardium are unremarkable, no right heart strain. Thoracic aorta is normal course and caliber, unremarkable. No lymphadenopathy by CT size criteria. LUNGS: Tracheobronchial tree is patent, no pneumothorax. No pleural effusions, focal consolidations, pulmonary nodules or masses. SOFT TISSUES AND OSSEOUS STRUCTURES: Hypodense liver compatible with steatosis. Thyromegaly partially imaged with substernal extent. Osseous structures are unremarkable. Review of the MIP images confirms the above findings. IMPRESSION: No acute pulmonary embolism or acute cardiopulmonary process. Electronically Signed   By: Elon Alas M.D.   On: 06/21/2015 03:39   2D ECHO: 06/21/2015 Study Conclusions - Left ventricle: The cavity size was normal. Wall thickness was  normal. Systolic function is mildly reduced, EF 45%. Doppler  parameters are consistent with abnormal left ventricular  relaxation (grade 1 diastolic dysfunction). Indeterminate filling  pressures. - Regional wall motion abnormality: Severe hypokinesis of the  apical septal and apical myocardium; hypokinesis of the mid  anteroseptal myocardium; moderate hypokinesis of the apical  lateral myocardium. - Mitral valve: There was mild regurgitation. - Tricuspid valve: There was mild regurgitation. _____________  [facility] 06/21/15 Procedures    Coronary Stent Intervention   Intravascular Ultrasound/IVUS   Left Heart Cath and Coronary Angiography    Conclusion     Mid Cx to Dist Cx lesion, 75% stenosed.  Mid RCA lesion, 40% stenosed.  There is moderate to severe left ventricular systolic dysfunction.  Mid LAD to Dist LAD lesion, 99% stenosed. Post intervention, there is a 10% residual stenosis.  Mid LAD lesion, 50% stenosed. Post intervention, there is a 0% residual stenosis.   Acute coronary syndrome with 99% thrombus filled stenosis  in the mid LAD.  Moderate distal circumflex stenosis. Mild mid right coronary stenosis.  Successful angioplasty and stenting of the mid to proximal LAD using overlapping stents over a 50 mm segment postdilated to 3.75 mm in diameter.  Significant left ventricular dysfunction with anteroapical severe hypokinesis and EF 35%  RECOMMENDATIONS:   Aspirin and Brilinta  Continue bivalirudin times an additional 30-45 minutes  Repeat echocardiogram in 48 hours to reassess anteroapical wall motion  Post MI risk factor modification including high-intensity statin therapy, aggressive diabetes control, smoking cessation, beta blocker and ACE inhibitor therapy, and encouragement but his pain in phase 1 cardiac rehabilitation and phase II cardiac rehabilitation with a goal towards weight reduction.  Estimate possible discharge Saturday morning, after echo, assuming no complications.      _____________  Relook cath 06/02/15 Procedures    Coronary/Graft Angiography    Conclusion     Mid Cx to Dist Cx lesion, 75% stenosed.  Mid LAD to Dist LAD lesion, 10% stenosed. The lesion was previously treated with a drug-eluting stent.  Mid RCA  lesion, 40% stenosed.  The LAD stented segment is widely patent. No evidence of thrombus, distal embolization, or dissection. Side branches are intact. Other vessels are unchanged.  Plan: Medical management.    _____________  2D ECHO: 06/25/2015 LV EF: 55% - 60% Study Conclusions - Left ventricle: The cavity size was normal. Systolic function was  normal. The estimated ejection fraction was in the range of 55%  to 60%. Very mild hypokinesis of the apicoseptal myocardium. Left  ventricular diastolic function parameters were normal. - Mitral valve: There was mild to moderate regurgitation directed  centrally. - Left atrium: The atrium was mildly dilated. - Tricuspid valve: There was mild-moderate regurgitation directed  centrally. -  Pericardium, extracardiac: A trivial pericardial effusion was  identified.   ASSESSMENT AND PLAN  Jennifer Cervantes is a 35 y.o. female with a history of uncontrolled DM, obesity and tobacco abuse who was tranferred to [facility] from [facility] on 06/21/15 for chest pain/NSTEMI.    CAD/Non-ST elevation MI - Peak troponin 2.33. - She underwent LHC on 06/21/15 which showed 75% occl mid-dist LCx, 40% occl mRCA, 99% mid-dist LAD s/p DES and 50% occl mLAD s/p DES (overlapping stents) and significant LV dysfunction with anteroapical HK.  - She had continued chest pain and went for re-look cath on 06/22/15 which showed patent stents and no evidence of thrombus, distal embolization, or dissection, side branches intact. - BP stable on low dose Coreg 3.125mg  BID and Imdur 15mg  daily. She was dizzy and ACE was discontinued. Continue ASA and Brilinta. She has filled out paperwork for patient assistance for Brilinta Rx.   Myocardial stunning: EF 45% on 2D ECHO on 06/21/15 and severe LV dysfunction on LV gram by cath. Repeat 2D ECHO with EF 55-60% and mild HK of the apicoseptal myocardium. Likely due to myocardial stunning from myocardial infarction.   Tricuspid regurgitation: mild-mod regurgitation directed centrally. Follow clinically   DMT2: uncontrolled. HGA1c ~9. Continue home Metformin. She will see PCP on 06/29/15 for further management.   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 After her second cardiac cath, she noted increasing swelling in her forearm and mild pain and weakness in her right hand. Arterial dopplers c/w radial artery occlusion.Dr. Bridgett Larsson with vascular surgery was consulted who felt that she was not a good surgical candidate for a thrombectomy due to need for DAPT. He recommended RUE wrist-brachial index in 3 months to follow the perfusion of this right arm. Venous duplex negative for DVT. No evidence of compartment  syndrome  Neuropraxia/Right hand weakness: patient still cannot move her lateral 3 fingers. 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 Recommended wrist splint and elevation. Unclear if median nerve was permanently damaged or simply stunned from procedure. If no better by 2 weeks will need NCV by out patient neurology. I have placed a neurology referral and [facility] will call the patient.   Abnormal TSH: TSH low at 0.184. Normal free T3, T4. Follow up with PCP.   Obesity: weight loss recommended.  Tobacco abuse: we had a long discussion about the importance of quitting smoking.   Judy Pimple PA-C  Pager [phone]  Patient seen and examined. Agree with assessment and plan. No movement in 1st 3 fingers c/w right median nerve mononeuropathy. Now with a splint. If no improvement may need nerve conduction study and EMG with neurology f/u. For  DC today. No recurrent chest pain, s/p LAD DES stents; Discussed smoking cessation.   Troy Sine, MD, [facility] 06/25/2015 6:09 PM

## 2015-06-25 NOTE — Progress Notes (Signed)
Orthopedic Tech Progress Note Patient Details:  Jennifer Cervantes Jan 04, 1981 [id number]  Ortho Devices Type of Ortho Device: Thumb spica splint, Velcro wrist splint Ortho Device/Splint Interventions: Application   [place] Pink 06/25/2015, 2:42 PM

## 2015-06-25 NOTE — Discharge Summary (Signed)
Discharge Summary    Patient ID: Jennifer Cervantes,  MRN: [id number], DOB/AGE: Oct 31, 1980 35 y.o.  Admit date: 06/21/2015 Discharge date: 06/25/2015  Primary Care Provider: [facility] Primary Cardiologist: Dr. Tamala Julian    Discharge Diagnoses    Principal Problem:   Non-ST elevation MI (NSTEMI) [facility]) Active Problems:   Tobacco abuse   Obesity   Abnormal thyroid function test   Uncontrolled diabetes mellitus (HCC)   CAD (coronary artery disease)   Tricuspid regurgitation   Neuropraxia of right median nerve   Myocardial stunning (HCC)   Allergies No Known Allergies   History of Present Illness     Jennifer Cervantes is a 35 y.o. female with a history of uncontrolled DM, obesity and tobacco abuse who was tranferred to [facility] from [place] on 06/21/15 for chest pain/NSTEMI  She initially presented to [facility] with chest pain. She ruled in for MI and ECG with diffuse non specific ST elevations. 2D ECHO showed EF 45%. She was transferred to [facility] for coronary angiography. She underwent LHC on 06/21/15 which showed 75% occl mid-dist LCx, 40% occl mRCA, 99% mid-dist LAD s/p DES and 50% occl mLAD s/p DES (overlapping stents) and significant LV dysfunction with anteroapical HK. She had continued chest pain and went for re-look cath on 06/22/15 via the radial approach which showed patent stents and no evidence of thrombus, distal embolization, or dissection, side branches intact. She was started on ASA/Brilinta as well as low dose Coreg, lisinopril and imdur; however, lisinopril was discontinued due to soft BPs and dizziness. Fortunately, Repeat 2D ECHO on 06/25/15 showed normalization of EF to 55-60% and mild HK of the apicoseptal myocardium. She was also noted to have mild-mod TR. Her hospital course was complicated by post cath right radial artery occlusion and neuropraxia. After her second cardiac cath, she noted increasing swelling in her forearm and mild pain and weakness in her right hand. Arterial  dopplers c/w radial artery occlusion.Dr. Bridgett Larsson with vascular surgery was consulted who felt that she was not a good surgical candidate for a thrombectomy due to need for DAPT. He recommended RUE wrist-brachial index in 3 months to follow the perfusion of this right arm. Venous duplex negative for DVT. No evidence of compartment syndrome. She continued to have issues with right finger movement and neurology was consulted. They felt that she had right median nerve mononeuropathy with neuropraxia after prolonged compression due to radial approach for cardiac catheterization. They recommended wrist splint and elevation. Unclear if median nerve was permanently damaged or simply stunned from procedure. If no better by 2 weeks will need NCV by out patient neurology.   Hospital Course     Consultants: vascular surgery and neurology    CAD/Non-ST elevation MI - Peak troponin 2.33. - She underwent LHC on 06/21/15 which showed 75% occl mid-dist LCx, 40% occl mRCA, 99% mid-dist LAD s/p DES and 50% occl mLAD s/p DES (overlapping stents) and significant LV dysfunction with anteroapical HK.  - She had continued chest pain and went for re-look cath on 06/22/15 which showed patent stents and no evidence of thrombus, distal embolization, or dissection, side branches intact. - BP stable on low dose Coreg 3.125mg  BID and Imdur 15mg  daily. She was dizzy and ACE was discontinued. Continue ASA and Brilinta. She has filled out paperwork for patient assistance for Brilinta Rx.  - She plans to work with cardiac rehab.   Myocardial stunning: EF 45% on 2D ECHO on 06/21/15 and severe LV dysfunction on LV gram by  cath. Repeat 2D ECHO with EF 55-60% and mild HK of the apicoseptal myocardium. Likely due to myocardial stunning from myocardial infarction.   Tricuspid regurgitation: mild-mod regurgitation directed centrally. Follow clinically   DMT2: uncontrolled. HGA1c ~9. Continue home Metformin. She will see PCP on 06/29/15 for  further management.   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 After her second cardiac cath, she noted increasing swelling in her forearm and mild pain and weakness in her right hand. Arterial dopplers c/w radial artery occlusion.Dr. Bridgett Larsson with vascular surgery was consulted who felt that she was not a good surgical candidate for a thrombectomy due to need for DAPT. He recommended RUE wrist-brachial index in 3 months to follow the perfusion of this right arm. Venous duplex negative for DVT. No evidence of compartment syndrome  Neuropraxia/Right hand weakness: patient still cannot move her lateral 3 fingers. 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 Recommended wrist splint and elevation. Unclear if median nerve was permanently damaged or simply stunned from procedure. If no better by 2 weeks will need NCV by out patient neurology. I have placed a neurology referral and [facility] will call the patient.   Abnormal TSH: TSH low at 0.184. Normal free T3, T4. Follow up with PCP.   Obesity: weight loss recommended.  Tobacco abuse: we had a long discussion about the importance of quitting smoking.   The patient has had an uncomplicated hospital course and is recovering well. The radial catheter site is stable. She has been seen by Dr. Claiborne Billings today and deemed ready for discharge home. All follow-up appointments have been scheduled. Smoking cessation was disscussed in length. A written RX for a 30 day free supply of Brilinta was provided for the patient. A work excuse note was provided as well. Discharge medications are listed below.  _____________  Discharge Vitals Blood pressure 116/76, pulse 75, temperature 97.8 F (36.6 C), temperature source Oral, resp. rate 19, height 5\' 9"  (1.753 m), weight 244 lb 9.6 oz (110.95 kg), last menstrual  period 05/26/2015, SpO2 99 %.  Filed Weights   06/23/15 1120 06/24/15 0500 06/25/15 0400  Weight: 243 lb 1.6 oz (110.269 kg) 245 lb 9.6 oz (111.403 kg) 244 lb 9.6 oz (110.95 kg)    Labs & Radiologic Studies     CBC  Recent Labs  06/24/15 0647 06/25/15 0025  WBC 6.7 6.9  HGB 11.3* 10.9*  HCT 33.4* 32.6*  MCV 89.3 90.3  PLT 217 [id number]   Basic Metabolic Panel  Recent Labs  06/24/15 0647  NA 140  K 4.7  CL 112*  CO2 19*  GLUCOSE 156*  BUN 12  CREATININE 0.72  CALCIUM 9.0   Liver Function Tests No results for input(s): AST, ALT, ALKPHOS, BILITOT, PROT, ALBUMIN in the last 72 hours. No results for input(s): LIPASE, AMYLASE in the last 72 hours. Cardiac Enzymes  Recent Labs  06/24/15 1211 06/24/15 1826 06/25/15 0025  TROPONINI 1.73* 1.90* 1.47*   Thyroid Function Tests  Recent Labs  06/23/15 1241  T3FREE 2.7    Dg Chest 2 View  06/21/2015  CLINICAL DATA:  Acute onset of central chest pain. Initial encounter. EXAM: CHEST  2 VIEW COMPARISON:  CTA of the chest performed earlier today at 3:19 a.m. FINDINGS: The lungs are well-aerated and clear. There is no evidence of focal opacification, pleural effusion or pneumothorax. The heart  is normal in size; the mediastinal contour is within normal limits. No acute osseous abnormalities are seen. IMPRESSION: No acute cardiopulmonary process seen. Electronically Signed   By: Garald Balding M.D.   On: 06/21/2015 03:50   Ct Angio Chest Pe W/cm &/or Wo Cm  06/21/2015  CLINICAL DATA:  Acute onset constant chest pressure for 4 hours. Nausea and vomiting, diaphoresis. History of diabetes. EXAM: CT ANGIOGRAPHY CHEST WITH CONTRAST TECHNIQUE: Multidetector CT imaging of the chest was performed using the standard protocol during bolus administration of intravenous contrast. Multiplanar CT image reconstructions and MIPs were obtained to evaluate the vascular anatomy. CONTRAST:  100 cc Isovue 370 COMPARISON:  None. FINDINGS: PULMONARY ARTERY:  Adequate contrast opacification of the pulmonary artery's. Main pulmonary artery is not enlarged. No pulmonary arterial filling defects to the level of the subsegmental branches. MEDIASTINUM: Heart and pericardium are unremarkable, no right heart strain. Thoracic aorta is normal course and caliber, unremarkable. No lymphadenopathy by CT size criteria. LUNGS: Tracheobronchial tree is patent, no pneumothorax. No pleural effusions, focal consolidations, pulmonary nodules or masses. SOFT TISSUES AND OSSEOUS STRUCTURES: Hypodense liver compatible with steatosis. Thyromegaly partially imaged with substernal extent. Osseous structures are unremarkable. Review of the MIP images confirms the above findings. IMPRESSION: No acute pulmonary embolism or acute cardiopulmonary process. Electronically Signed   By: Elon Alas M.D.   On: 06/21/2015 03:39     Diagnostic Studies/Procedures     2D ECHO: 06/21/2015 Study Conclusions - Left ventricle: The cavity size was normal. Wall thickness was  normal. Systolic function is mildly reduced, EF 45%. Doppler  parameters are consistent with abnormal left ventricular  relaxation (grade 1 diastolic dysfunction). Indeterminate filling  pressures. - Regional wall motion abnormality: Severe hypokinesis of the  apical septal and apical myocardium; hypokinesis of the mid  anteroseptal myocardium; moderate hypokinesis of the apical  lateral myocardium. - Mitral valve: There was mild regurgitation. - Tricuspid valve: There was mild regurgitation. _____________  [facility] 06/21/15 Procedures    Coronary Stent Intervention   Intravascular Ultrasound/IVUS   Left Heart Cath and Coronary Angiography    Conclusion     Mid Cx to Dist Cx lesion, 75% stenosed.  Mid RCA lesion, 40% stenosed.  There is moderate to severe left ventricular systolic dysfunction.  Mid LAD to Dist LAD lesion, 99% stenosed. Post intervention, there is a 10% residual stenosis.  Mid  LAD lesion, 50% stenosed. Post intervention, there is a 0% residual stenosis.   Acute coronary syndrome with 99% thrombus filled stenosis in the mid LAD.  Moderate distal circumflex stenosis. Mild mid right coronary stenosis.  Successful angioplasty and stenting of the mid to proximal LAD using overlapping stents over a 50 mm segment postdilated to 3.75 mm in diameter.  Significant left ventricular dysfunction with anteroapical severe hypokinesis and EF 35%  RECOMMENDATIONS:   Aspirin and Brilinta  Continue bivalirudin times an additional 30-45 minutes  Repeat echocardiogram in 48 hours to reassess anteroapical wall motion  Post MI risk factor modification including high-intensity statin therapy, aggressive diabetes control, smoking cessation, beta blocker and ACE inhibitor therapy, and encouragement but his pain in phase 1 cardiac rehabilitation and phase II cardiac rehabilitation with a goal towards weight reduction.  Estimate possible discharge Saturday morning, after echo, assuming no complications.      _____________  Relook cath 06/02/15 Procedures    Coronary/Graft Angiography    Conclusion     Mid Cx to Dist Cx lesion, 75% stenosed.  Mid LAD to Dist LAD lesion,  10% stenosed. The lesion was previously treated with a drug-eluting stent.  Mid RCA lesion, 40% stenosed.  The LAD stented segment is widely patent. No evidence of thrombus, distal embolization, or dissection. Side branches are intact. Other vessels are unchanged.  Plan: Medical management.    _____________  2D ECHO: 06/25/2015 LV EF: 55% - 60% Study Conclusions - Left ventricle: The cavity size was normal. Systolic function was  normal. The estimated ejection fraction was in the range of 55%  to 60%. Very mild hypokinesis of the apicoseptal myocardium. Left  ventricular diastolic function parameters were normal. - Mitral valve: There was mild to moderate regurgitation directed   centrally. - Left atrium: The atrium was mildly dilated. - Tricuspid valve: There was mild-moderate regurgitation directed  centrally. - Pericardium, extracardiac: A trivial pericardial effusion was  identified.    Disposition   Pt is being discharged home today in good condition.  Follow-up Plans & Appointments    Follow-up Information    Follow up with Crisoforo Oxford, PA-C. Go on 06/29/2015.   Specialty:  Family Medicine   Why:  at 1015 am for new PRIMARY CARE and hospital follow up   Contact information:   [place] [address] [phone]       Follow up with Eileen Stanford, PA-C On 07/04/2015.   Specialties:  Cardiology, Radiology   Why:  @ 3:30pm (please arrive at 3:15pm)   Contact information:   [place] [phone]       Follow up with Adele Barthel, MD On 09/28/2015.   Specialties:  Vascular Surgery, Cardiology   Why:  @ 10:30 (please arrive at 10:15am) for an ultrasoudn and to see Dr. Delila Pereyra information:   [place] [address] [phone]       Follow up with [facility].   Why:  The office will contact you. , You should be seen within 1-2 weeks., If you do not hear from them, please contact them.   Contact information:   [address]     [address] [place] [phone]     Discharge Instructions    AMB Referral to [place] Management    Complete by:  As directed   Please call to schedule LTW appointment for DM management. She works at [employer]. If possible, please schedule appointment for [facility]. Consent signed and she will bring packet to appointment. Best contact number is [phone]. Please call me with questions. Thanks. Marthenia Rolling, [place], [facility] [phone]  Reason for consult:  Please call for LTW appointment  Diagnoses of:  Diabetes  Expected date of  contact:  1-3 days (reserved for hospital discharges)     Amb Referral to Cardiac Rehabilitation    Complete by:  As directed   Diagnosis:   Coronary Stents NSTEMI             Discharge Medications   Current Discharge Medication List    START taking these medications   Details  aspirin EC 81 MG EC tablet Take 1 tablet (81 mg total) by mouth daily.    atorvastatin (LIPITOR) 80 MG tablet Take 1 tablet (80 mg total) by mouth daily at 6 PM. Qty: 90 tablet, Refills: 2    carvedilol (COREG) 3.125 MG tablet Take 1 tablet (3.125 mg total) by mouth 2 (two) times daily with a meal. Qty: 60 tablet, Refills: 3  isosorbide mononitrate (IMDUR) 30 MG 24 hr tablet Take 0.5 tablets (15 mg total) by mouth daily. Qty: 30 tablet, Refills: 3    ticagrelor (BRILINTA) 90 MG TABS tablet Take 1 tablet (90 mg total) by mouth 2 (two) times daily. Qty: 180 tablet, Refills: 6      CONTINUE these medications which have NOT CHANGED   Details  Cholecalciferol (VITAMIN D3) 50000 units CAPS Take 1 capsule by mouth once a week.    metFORMIN (GLUCOPHAGE) 1000 MG tablet Take 1,000 mg by mouth 2 (two) times daily with a meal.         Aspirin prescribed at discharge? YES High Intensity Statin Prescribed? YES Beta Blocker Prescribed? YES For EF 45% or less, Was ACEI/ARB Prescribed? NO. N/A ADP Receptor Inhibitor Prescribed? YES For EF <40%, Aldosterone Inhibitor Prescribed? NO, N/A Was EF assessed during THIS hospitalization? YES Was Cardiac Rehab II ordered? (Included Medically managed Patients): YES   Outstanding Labs/Studies   None.   Duration of Discharge Encounter   Greater than 30 minutes including physician time.  Mable Fill R PA-C 06/25/2015, 5:07 PM

## 2015-06-25 NOTE — Progress Notes (Signed)
  Echocardiogram 2D Echocardiogram has been performed.  Darlina Sicilian M 06/25/2015, 8:24 AM

## 2015-06-25 NOTE — Care Management Note (Signed)
Case Management Note  Patient Details  Name: JANIE GILKERSON MRN: [id number] Date of Birth: [birth year]  Subjective/Objective:   Pt admitted for Nstemi. Pt has Brilinta co pay and 30 day free card.                  Action/Plan: CM did provide pt with the [facility] Number for PCP. CM did make pt aware that she will need a [facility] MD to follow. New diabetes- CM did provide pt with information to [facility] for the R[employer] on Brand Glucose Meter and cost. [facility] did f/u with pt in regards to PCP needs and to see if pt is agreeable to Services. No further needs from CM at this time.   Expected Discharge Date:                  Expected Discharge Plan:  Acute to Acute Transfer  In-House Referral:  NA  Discharge planning Services  CM Consult, Medication Assistance  Post Acute Care Choice:  NA Choice offered to:  NA  DME Arranged:  N/A DME Agency:  NA  HH Arranged:  NA HH Agency:     Status of Service:  Completed, signed off  Medicare Important Message Given:    Date Medicare IM Given:    Medicare IM give by:    Date Additional Medicare IM Given:    Additional Medicare Important Message give by:     If discussed at [place] of Stay Meetings, dates discussed:    Additional Comments:  Bethena Roys, RN 06/25/2015, 11:30 AM

## 2015-06-25 NOTE — Consult Note (Signed)
   [facility] CM Inpatient Consult   06/25/2015  ELEANORA REUTOV Feb 11, 1981 [id number]    Patient screened for Link to Wellness program for [place] employees/dependents with [facility] insurance. Spoke with inpatient RNCM prior to bedside encounter. Went to bedside to explain Link to [facility] Care Management program for [facility] employees/dependents with [facility] insurance. Discussed benefits for Link to Wellness program for DM management. Also discussed Primary Care MD. She states she confirms she needs to find a new Primary Care MD. Writer was able to assist patient with finding a new Primary Care MD. Appointment was made with Dorothea Ogle, PA with [facility] on 06/29/15 at 1015. Made patient aware that she will need to call to reschedule appointment if she needs to and if she will be a no-show, she will not be able to be seen at that practice. She expressed understanding. Gave her all of the details regarding appointment, address, and telephone number. Expressed appreciation of assistance. Also written consent obtained for Link to Wellness program for DM management informed her that she will receive a call from [place] Management/LTW office to schedule appointment with a Link to [facility]. Program packet, contact information, and calendar given to Ms. Nelda Marseille. Made her aware that packet will need to be filled out prior to appointment. She is agreeable to this.  Confirmed best contact number is [phone]. She works nights at [employer]. Discussed that an appointment could possibly be made with Link to Wellness Coordinator to meet her at [facility] on select days.   Will refer to [place] to Wellness office to request for patient to be scheduled for LTW appointment follow up.  Made inpatient RNCM aware of all of the above.   Marthenia Rolling, MSN-Ed, RN,BSN [facility] Liaison [phone]

## 2015-06-25 NOTE — Discharge Instructions (Signed)

## 2015-06-25 NOTE — Progress Notes (Signed)
Pt ambulatory in hallways independently without difficulty x 2 last pm.

## 2015-06-25 NOTE — Consult Note (Signed)
NEURO HOSPITALIST CONSULT NOTE   Requestig physician: Dr. Tamala Julian   Reason for Consult: right median nerve mononeuropathy    HPI:                                                                                                                                          Jennifer Cervantes is an 35 y.o. female  Presenting to hospital with CP and underwent two Cardiac catheterizations via radial approach. The second cath was complicated by post procedural bleeding from Cath site necessitating prolonged TR band compression. After the release of TR band patient noted she could not feel her hand or move her fingers. 4 days post she is able to move her small and ring finger but still unable to move her middle, index and thumb and has significant pain to touch. Neurology consulted for these symptoms.   Past Medical History  Diagnosis Date  . Uncontrolled diabetes mellitus ([place])     a. Type II  . CAD (coronary artery disease)     a. LHC on 06/21/15 which showed 75% occl mid-dist LCx, 40% occl mRCA, 99% mid-dist LAD s/p DES and 50% occl mLAD s/p DES (overlapping stents) and significant LV dysfunction with anteroapical HK  . Tobacco abuse   . Obesity   . Ischemic cardiomyopathy     a. EF 45% on 2D ECHO on 06/21/15 and severe LV dysfunction on LV gram by cath. Repeat 2D ECHO with EF 55-60% and mild HK of the apicoseptal myocardium.    Past Surgical History  Procedure Laterality Date  . Cardiac catheterization N/A 06/21/2015    Procedure: Left Heart Cath and Coronary Angiography;  Surgeon: Belva Crome, MD;  Location: [facility];  Service: Cardiovascular;  Laterality: N/A;  . Cardiac catheterization N/A 06/21/2015    Procedure: Coronary Stent Intervention;  Surgeon: Belva Crome, MD;  Location: [facility];  Service: Cardiovascular;  Laterality: N/A;  . Cardiac catheterization N/A 06/21/2015    Procedure: Intravascular Ultrasound/IVUS;  Surgeon: Belva Crome, MD;   Location: [facility];  Service: Cardiovascular;  Laterality: N/A;  LAD  . Cardiac catheterization  06/22/2015    Procedure: Coronary/Graft Angiography;  Surgeon: Peter M Martinique, MD;  Location: [facility];  Service: Cardiovascular;;    Family History  Problem Relation Age of Onset  . Heart attack Neg Hx   . Heart failure Neg Hx      Social History:  reports that she has been smoking.  She has never used smokeless tobacco. She reports that she does not drink alcohol or use illicit drugs.  No Known Allergies  MEDICATIONS:  Prior to Admission:  Prescriptions prior to admission  Medication Sig Dispense Refill Last Dose  . Cholecalciferol (VITAMIN D3) 50000 units CAPS Take 1 capsule by mouth once a week.   Past Week at Unknown time  . metFORMIN (GLUCOPHAGE) 1000 MG tablet Take 1,000 mg by mouth 2 (two) times daily with a meal.   06/20/2015 at Unknown time   Scheduled: . aspirin EC  81 mg Oral Daily  . atorvastatin  80 mg Oral q1800  . carvedilol  3.125 mg Oral BID WC  . insulin aspart  0-15 Units Subcutaneous TID WC  . insulin aspart  0-5 Units Subcutaneous QHS  . isosorbide mononitrate  15 mg Oral Daily  . sodium chloride flush  3 mL Intravenous Q12H  . ticagrelor  90 mg Oral BID  . Vitamin D (Ergocalciferol)  50,000 Units Oral Q7 days     ROS:                                                                                                                                       History obtained from the patient  General ROS: negative for - chills, fatigue, fever, night sweats, weight gain or weight loss Psychological ROS: negative for - behavioral disorder, hallucinations, memory difficulties, mood swings or suicidal ideation Ophthalmic ROS: negative for - blurry vision, double vision, eye pain or loss of vision ENT ROS: negative for - epistaxis, nasal  discharge, oral lesions, sore throat, tinnitus or vertigo Allergy and Immunology ROS: negative for - hives or itchy/watery eyes Hematological and Lymphatic ROS: negative for - bleeding problems, bruising or swollen lymph nodes Endocrine ROS: negative for - galactorrhea, hair pattern changes, polydipsia/polyuria or temperature intolerance Respiratory ROS: negative for - cough, hemoptysis, shortness of breath or wheezing Cardiovascular ROS: negative for - chest pain, dyspnea on exertion, edema or irregular heartbeat Gastrointestinal ROS: negative for - abdominal pain, diarrhea, hematemesis, nausea/vomiting or stool incontinence Genito-Urinary ROS: negative for - dysuria, hematuria, incontinence or urinary frequency/urgency Musculoskeletal ROS: negative for - joint swelling or muscular weakness Neurological ROS: as noted in HPI Dermatological ROS: negative for rash and skin lesion changes   Blood pressure 116/76, pulse 75, temperature 97.8 F (36.6 C), temperature source Oral, resp. rate 19, height 5\' 9"  (1.753 m), weight 110.95 kg (244 lb 9.6 oz), last menstrual period 05/26/2015, SpO2 99 %.   Neurologic Examination:  HEENT-  Normocephalic, no lesions, without obvious abnormality.  Normal external eye and conjunctiva.  Normal TM's bilaterally.  Normal auditory canals and external ears. Normal external nose, mucus membranes and septum.  Normal pharynx. Cardiovascular- S1, S2 normal, pulses palpable throughout   Lungs- chest clear, no wheezing, rales, normal symmetric air entry Abdomen- normal findings: bowel sounds normal Extremities- right wrist and thenar region of her hand is swollen Lymph-no adenopathy palpable Musculoskeletal-inability to move right hand thumb, index and middle finger Skin-warm and dry, no hyperpigmentation, vitiligo, or suspicious lesions  Neurological Examination Mental  Status: Alert, oriented, thought content appropriate.  Speech fluent without evidence of aphasia.  Able to follow 3 step commands without difficulty. Cranial Nerves: II: Visual fields grossly normal, pupils equal, round, reactive to light and accommodation III,IV, VI: ptosis not present, extra-ocular motions intact bilaterally V,VII: smile symmetric, facial light touch sensation normal bilaterally VIII: hearing normal bilaterally IX,X: uvula rises symmetrically XI: bilateral shoulder shrug XII: midline tongue extension Motor: Right : Upper extremity   See note   Left:     Upper extremity   5/5  Lower extremity   5/5     Lower extremity   5/5 --right hand showed no ability to move her thumb, index and middle finger with minimal movement of her ring finger. able to move her small finger but had decreased strength. There was noted swelling along the wrist and in the thenar eminence.  Tone and bulk:normal tone throughout; no atrophy noted Sensory: Pinprick and light touch intact throughout, bilaterally--with allodynia of the right median nerve distribution.  Deep Tendon Reflexes: 2+ and symmetric throughout Plantars: Right: downgoing   Left: downgoing Cerebellar: normal finger-to-nose and normal heel-to-shin test Gait: not tested      Lab Results: Basic Metabolic Panel:  Recent Labs Lab 06/21/15 0225 06/22/15 0656 06/24/15 0647  NA 139 139 140  K 3.9 4.2 4.7  CL 105 108 112*  CO2 20* 21* 19*  GLUCOSE 178* 165* 156*  BUN 13 9 12   CREATININE 1.00 0.84 0.72  CALCIUM 9.5 8.8* 9.0    Liver Function Tests:  Recent Labs Lab 06/22/15 0656  AST 46*  ALT 25  ALKPHOS 64  BILITOT 0.3  PROT 6.7  ALBUMIN 3.4*   No results for input(s): LIPASE, AMYLASE in the last 168 hours. No results for input(s): AMMONIA in the last 168 hours.  CBC:  Recent Labs Lab 06/21/15 0149 06/22/15 0656 06/23/15 0540 06/24/15 0647 06/25/15 0025  WBC 12.4* 8.2 8.1 6.7 6.9  NEUTROABS 8.7*  --    --   --   --   HGB 15.4* 12.0 11.3* 11.3* 10.9*  HCT 44.2 35.5* 34.4* 33.4* 32.6*  MCV 89.1 89.4 91.2 89.3 90.3  PLT 215 211 198 217 172    Cardiac Enzymes:  Recent Labs Lab 06/24/15 0020 06/24/15 0647 06/24/15 1211 06/24/15 1826 06/25/15 0025  TROPONINI 2.33* 1.96* 1.73* 1.90* 1.47*    Lipid Panel:  Recent Labs Lab 06/22/15 0033  CHOL 152  TRIG 137  HDL 30*  CHOLHDL 5.1  VLDL 27  LDLCALC 95    CBG:  Recent Labs Lab 06/24/15 1129 06/24/15 1624 06/24/15 2151 06/25/15 0724 06/25/15 1117  GLUCAP 147* 111* 132* 145* 148*    Microbiology: No results found for this or any previous visit.  Coagulation Studies: No results for input(s): LABPROT, INR in the last 72 hours.  Imaging: No results found.     Assessment and plan per attending neurologist  Assessment/Plan:   35 YO  Female with right median nerve mononeuropathy after prolonged compression due to radial approach for cardiac catheterization. At this time would incorporate wrist splint and elevation. Unclear if median nerve was permanently damaged or simply stunned from procedure. If no better by 2 weeks will need NCV by out patient neurology.   If this is needed would contact: [facility], [place], [place] [address]--[phone]   Etta Quill PA-C [facility] [phone]  06/25/2015, 2:48 PM    agree with above assessment and plan. If no improvement in 2 weeks, will need EMG and neurology assessment as outpatient. Most likely neuropraxia, stretch of brachial plexus from procedural positioning. Already showing sings of improvement

## 2015-06-26 ENCOUNTER — Other Ambulatory Visit: Payer: Self-pay | Admitting: *Deleted

## 2015-06-26 DIAGNOSIS — M79641 Pain in right hand: Secondary | ICD-10-CM

## 2015-06-27 ENCOUNTER — Telehealth: Payer: Self-pay | Admitting: Internal Medicine

## 2015-06-27 ENCOUNTER — Telehealth: Payer: Self-pay | Admitting: Cardiovascular Disease

## 2015-06-27 DIAGNOSIS — Z6835 Body mass index (BMI) 35.0-35.9, adult: Secondary | ICD-10-CM | POA: Diagnosis not present

## 2015-06-27 DIAGNOSIS — I213 ST elevation (STEMI) myocardial infarction of unspecified site: Secondary | ICD-10-CM | POA: Diagnosis not present

## 2015-06-27 DIAGNOSIS — Z01419 Encounter for gynecological examination (general) (routine) without abnormal findings: Secondary | ICD-10-CM | POA: Diagnosis not present

## 2015-06-27 NOTE — Telephone Encounter (Signed)
Will review with provider in office as pt was seen by several providers in hospital and PCI was done by Dr. Tamala Julian

## 2015-06-27 NOTE — Telephone Encounter (Signed)
Error - wrong provider

## 2015-06-27 NOTE — Telephone Encounter (Signed)
Spoke with Jennifer Cervantes. She was discharged from hospital on 5/1.  Did not check blood pressure until today. She developed headache around 1:00 and took blood pressure. Readings are below.  Heart rate was 100 at that time.  She took Alleve around 2:30.  Headache is now a little better but still present.  She is on Imdur and stated she did have headaches while in the hospital also.  She checked blood pressure again around 4:15 and it is now 148/94.  Is taking Coreg as listed. I advised her to take Tylenol instead of Alleve for headache.  Jennifer Cervantes reports blood pressure was not elevated when in hospital. Per discharge note lisinopril was stopped in hospital due to blood pressure readings and dizziness.  Will forward to Dr. Tamala Julian for recommendations.

## 2015-06-27 NOTE — Telephone Encounter (Signed)
Pt c/o BP issue: STAT if pt c/o blurred vision, one-sided weakness or slurred speech  1. What are your last 5 BP readings? Taken today- 167/102,, 147/100  2. Are you having any other symptoms (ex. Dizziness, headache, blurred vision, passed out)? headache  3. What is your BP issue? BP high  Pt was seen in [place] by Dr Bronson Ing- has f/u w/ K thompson 5/24 @ 330

## 2015-06-27 NOTE — Telephone Encounter (Signed)
Looks like patient was only seen by Dr. Bronson Ing in the hospital.  Her follow up is scheduled with Angelena Form in the [facility] office.  Forwarding to [employer].

## 2015-06-27 NOTE — Telephone Encounter (Signed)
Reviewed with Elberta Leatherwood, PharmD who recommends pt take extra dose Coreg tonight when she takes evening dose. Stop Imdur for a day and see if headache improves.  Call us back later this week with update on blood pressure.  I spoke with pt and gave her instructions from pharmacist. She will call us on Friday with an update

## 2015-06-29 ENCOUNTER — Other Ambulatory Visit: Payer: Self-pay | Admitting: *Deleted

## 2015-06-29 ENCOUNTER — Encounter: Payer: Self-pay | Admitting: Medical

## 2015-06-29 ENCOUNTER — Ambulatory Visit (INDEPENDENT_AMBULATORY_CARE_PROVIDER_SITE_OTHER): Payer: 59 | Admitting: Medical

## 2015-06-29 VITALS — BP 118/74 | HR 86 | Wt 241.0 lb

## 2015-06-29 DIAGNOSIS — Z720 Tobacco use: Secondary | ICD-10-CM | POA: Diagnosis not present

## 2015-06-29 DIAGNOSIS — E669 Obesity, unspecified: Secondary | ICD-10-CM | POA: Diagnosis not present

## 2015-06-29 DIAGNOSIS — I25118 Atherosclerotic heart disease of native coronary artery with other forms of angina pectoris: Secondary | ICD-10-CM

## 2015-06-29 DIAGNOSIS — I214 Non-ST elevation (NSTEMI) myocardial infarction: Secondary | ICD-10-CM | POA: Diagnosis not present

## 2015-06-29 DIAGNOSIS — E088 Diabetes mellitus due to underlying condition with unspecified complications: Secondary | ICD-10-CM | POA: Insufficient documentation

## 2015-06-29 DIAGNOSIS — E118 Type 2 diabetes mellitus with unspecified complications: Secondary | ICD-10-CM | POA: Diagnosis not present

## 2015-06-29 MED ORDER — INSULIN GLARGINE 100 UNIT/ML SOLOSTAR PEN: 10.0000 [IU] | PEN_INJECTOR | Freq: Every day | SUBCUTANEOUS | Status: DC

## 2015-06-29 MED ORDER — METFORMIN HCL 1000 MG PO TABS: 1000.0000 mg | ORAL_TABLET | Freq: Two times a day (BID) | ORAL | Status: DC

## 2015-06-29 MED ORDER — LISINOPRIL 2.5 MG PO TABS: 2.5000 mg | ORAL_TABLET | Freq: Every day | ORAL | Status: DC

## 2015-06-29 NOTE — Telephone Encounter (Signed)
Pt was seen by medical doctor today.

## 2015-06-29 NOTE — Progress Notes (Signed)
Subjective: Chief Complaint  Patient presents with  . New Patient (Initial Visit)  . Follow-up    hospital f/up. had a heart attack. pt states now she is okay.  . Diabetes    said that it is around 100 and has gotten to the 300s   Here as a new patient.   Was seeing [facility] prior.   She was just hospitalized recently for heart attack.   She has hx/o significant for diabetes type 2 diagnosed 6 years ago, hyperlipidemia, and was smoking up until the recent hospital.  Since she has been discharged, has been walking 5 minutes daily, has been checking glucose, weights, BP.  Prior to the hospitalization was smoking, had no diet discretion, was not exercising, and not monitoring glucose, BP or anything.  She realized this was not good and the heart attack was an eye opener.  She quit tobacco as of the hospitalization.  Currently she denies any acute concerns.  She had an issue with the right hand with numbness during cath and neurology f/u with this.  She sees cardiology in follow up at the end of the month.   She notes that the whole time she has been diabetic she has been using Metformin 500mg  BID, and although HgbA1C was last 11% in January and over 16% at one point, notes never being put on any other medication for diabetes.    ROS as in subjective  Past Medical History  Diagnosis Date  . Uncontrolled diabetes mellitus ([place])     a. Type II  . CAD (coronary artery disease)     a. LHC on 06/21/15 which showed 75% occl mid-dist LCx, 40% occl mRCA, 99% mid-dist LAD s/p DES and 50% occl mLAD s/p DES (overlapping stents) and significant LV dysfunction with anteroapical HK  . Tobacco abuse   . Obesity   . Myocardial stunning ([facility])     a. EF 45% on 2D ECHO on 06/21/15 and severe LV dysfunction on LV gram by cath. Repeat 2D ECHO with EF 55-60% and mild HK of the apicoseptal myocardium.  . Tricuspid regurgitation   . Neuropraxia of right median nerve     a. suspected after right radial  cath. will follow with neuro outpatient if does not resolve.   . Hypertension   . Hyperlipidemia   . Diabetes mellitus without complication ([place])   . NSTEMI (non-ST elevated myocardial infarction) [facility]) 05/2015    [facility]   Past Surgical History  Procedure Laterality Date  . Cardiac catheterization N/A 06/21/2015    Procedure: Left Heart Cath and Coronary Angiography;  Surgeon: Belva Crome, MD;  Location: [facility];  Service: Cardiovascular;  Laterality: N/A;  . Cardiac catheterization N/A 06/21/2015    Procedure: Coronary Stent Intervention;  Surgeon: Belva Crome, MD;  Location: [facility];  Service: Cardiovascular;  Laterality: N/A;  . Cardiac catheterization N/A 06/21/2015    Procedure: Intravascular Ultrasound/IVUS;  Surgeon: Belva Crome, MD;  Location: [facility];  Service: Cardiovascular;  Laterality: N/A;  LAD  . Cardiac catheterization  06/22/2015    Procedure: Coronary/Graft Angiography;  Surgeon: Peter M Martinique, MD;  Location: [facility];  Service: Cardiovascular;;   Objective: BP 118/74 mmHg  Pulse 86  Wt 241 lb (109.317 kg)  LMP 06/25/2015  General appearance: alert, no distress, WD/WN, pleasant AA female Neck: supple, no lymphadenopathy, no thyromegaly, no masses, no bruits Heart: RRR, normal S1, S2, no murmurs Lungs:  CTA bilaterally, no wheezes, rhonchi, or rales Ext: no edema Pulses: 2+ symmetric, upper and lower extremities, normal cap refill She has reinforced brace on right forearm.    Assessment: Encounter Diagnoses  Name Primary?  . Type 2 diabetes mellitus with complication, without long-term current use of insulin ([place]) Yes  . Coronary artery disease involving native coronary artery of native heart with other form of angina pectoris ([place])   . Obesity   . Tobacco abuse   . Non-ST elevation MI (NSTEMI) ([place])      Plan: reviewed the recent hospitalization notes, discharge summary, labs, and other records.  She  knows that she can do better with diet and lifestyle and the heart attack was eye opening.  She has quit tobacco, is compliant with the recent medication changes.   She has f/u scheduled with neurology and cardiology, will be starting cardiac rehab.   Diabetes type 2 - increase to Metformin 1000mg  BID, add Lantus 10 u QHS.  Gave demo on proper use of Lantus and gave first injection today in office.   Monitor sugars.  discussed avoidance of hypoglycemia.  discussed diet recommendations, food to avoid, discipline with diet.   CAD, recent non ST elevation MI - reviewed recent records, c/t Coreg 3.125mg  BID recently started Brilinta 90mg  BID, Aspirin 81mg  daily, Lipitor 80mg  daily.  We will hold off on HCTZ today.   But begin back on Lisinopril 2.5mg  daily.  Monitor BPs and call if BPs running low or feeling dizzy.  F/u with cardiology as planned.  F/u with neurology as planned for the right arm numbness  She has quit tobacco and congratulated her on this!  Remain tobacco free.  Obesity - same plan as diabetes above  Zariyah was seen today for new patient (initial visit), follow-up and diabetes.  Diagnoses and all orders for this visit:  Type 2 diabetes mellitus with complication, without long-term current use of insulin (HCC)  Coronary artery disease involving native coronary artery of native heart with other form of angina pectoris ([place])  Obesity  Tobacco abuse  Non-ST elevation MI (NSTEMI) ([place])  Other orders -     metFORMIN (GLUCOPHAGE) 1000 MG tablet; Take 1 tablet (1,000 mg total) by mouth 2 (two) times daily with a meal. -     Insulin Glargine (LANTUS SOLOSTAR) 100 UNIT/ML Solostar Pen; Inject 10 Units into the skin daily at 10 pm.

## 2015-06-29 NOTE — Patient Outreach (Signed)
Telephone call made to Ms. Espiritu post hospital discharge at [phone]. She confirms she has had her follow up Primary Care visit and she is very pleased. Ms. Ehresmann endorses that she will follow up with S. Tysinger, PA in one month. She states she has to call to make an appointment. States she is now on the insulin pen. She reports her blood sugars have been "all over the place". States she is taking all of her prescribed medications. Denies having any issues or concerns at this time. Reports she has faxed in her Link to Wellness packet to the [facility] office and plans on visit with Link to Wellness RN Coordinator on May 31st. Ms. anmol decook appreciation of assistance with finding her a new Primary Care Provider. No further concerns voiced. Appreciative of the call.  Marthenia Rolling, MSN-Ed, RN,BSN [facility] Liaison [phone]

## 2015-06-29 NOTE — Patient Outreach (Signed)
Telephone call made to Jennifer Cervantes post hospital discharge at [phone]. She confirms she has had her follow up Primary Care visit and she is very pleased. Ms. Stay endorses that she will follow up with S. Tysinger, PA in one month. She states she has to call to make an appointment. States she is now on the insulin pen. She reports her blood sugars have been "all over the place". States she is taking all of her prescribed medications. Denies having any issues or concerns at this time. Reports she has faxed in her Link to Wellness packet to the [facility] office and plans on visit with Link to Wellness RN Coordinator on May 31st. [name] appreciation of assistance with finding her a new Primary Care Provider. No further concerns voiced. Appreciative of the call.  Marthenia Rolling, MSN-Ed, RN,BSN [facility] Liaison [phone]

## 2015-06-29 NOTE — Patient Instructions (Signed)
Recommendations:  Change to Metformin 1000mg  twice daily  Whatever metformin 500mg  you have left, double up to 2 tablets twice daily.  When you run out of this, then begin the new tablets of 1000mg  twice daily  Begin Lantus 10 units at bedtime daily. This is a long acting insulin  Begin Lisinopril 2.5mg  daily for blood pressure  continue to monitor blood sugars daily.  Goal is under 130 daily.  If you are seeing numbers over 250 let me know.  Continue to monitor weights.  If more than a 5lb weight gain in a single day, then call us or cardiology  Continue to monitor blood pressures.     If blood pressure is running 100/60 or less or if getting dizzy, then hold off on the Lisinopril but I didn't think you are going to have this issue  Follow up with cardiology as planned  Work on healthier diet as we discussed  I'd like to see you back in 3-4 weeks.

## 2015-07-04 ENCOUNTER — Ambulatory Visit (INDEPENDENT_AMBULATORY_CARE_PROVIDER_SITE_OTHER): Payer: 59 | Admitting: Neurology

## 2015-07-04 ENCOUNTER — Encounter: Payer: Self-pay | Admitting: Neurology

## 2015-07-04 VITALS — BP 118/76 | HR 74 | Ht 69.0 in | Wt 243.2 lb

## 2015-07-04 DIAGNOSIS — S5411XS Injury of median nerve at forearm level, right arm, sequela: Secondary | ICD-10-CM | POA: Diagnosis not present

## 2015-07-04 MED ORDER — GABAPENTIN 300 MG PO CAPS: 300.0000 mg | ORAL_CAPSULE | Freq: Two times a day (BID) | ORAL | Status: DC

## 2015-07-04 NOTE — Progress Notes (Signed)
Reason for visit: Right hand numbness and weakness  Referring physician: [facility] LAURINA GUIA is a 35 y.o. female  History of present illness:  Ms. Bustin is a 35 year old right-handed black female with a history of diabetes who was admitted to the hospital around 06/21/2015 with chest pain, she underwent a coronary artery catheterization the access to the brachial artery on the right. The patient had 2 catheterizations, the first catheterization required some compression for about 6 hours on the right arm. After the second catheterization the next day she began having significant swelling of the forearm and hand. The patient began noting problems with numbness and weakness of the hand, and some discomfort. The patient eventually regained the ability to flex the fourth and fifth fingers on the right hand prior to discharge. Since discharge, she has had ongoing improvement with ability to extend and flex all the fingers, but she continues to have numbness and discomfort mainly on the thumb and index finger on the right. The patient denies any discomfort above the elbow on the right, no neck pain or shoulder pain. She denies any pain or numbness on the left arm or the legs. She has not had any problems with balance, bowel or bladder control, vision changes, or speech or swallowing issues. The patient is taking Tylenol for discomfort. She notes that anything that touches the hand may result in some pain. The swelling in the right arm has improved, but is still there to some degree. She is using a wrist splint with some benefit.  Past Medical History  Diagnosis Date  . Uncontrolled diabetes mellitus ([place])     a. Type II  . CAD (coronary artery disease)     a. LHC on 06/21/15 which showed 75% occl mid-dist LCx, 40% occl mRCA, 99% mid-dist LAD s/p DES and 50% occl mLAD s/p DES (overlapping stents) and significant LV dysfunction with anteroapical HK  . Tobacco abuse   . Obesity   .  Myocardial stunning ([facility])     a. EF 45% on 2D ECHO on 06/21/15 and severe LV dysfunction on LV gram by cath. Repeat 2D ECHO with EF 55-60% and mild HK of the apicoseptal myocardium.  . Tricuspid regurgitation   . Neuropraxia of right median nerve     a. suspected after right radial cath. will follow with neuro outpatient if does not resolve.   . Hypertension   . Hyperlipidemia   . Diabetes mellitus without complication ([place])   . NSTEMI (non-ST elevated myocardial infarction) [facility]) 05/2015    [facility]    Past Surgical History  Procedure Laterality Date  . Cardiac catheterization N/A 06/21/2015    Procedure: Left Heart Cath and Coronary Angiography;  Surgeon: Belva Crome, MD;  Location: [place] [facility];  Service: Cardiovascular;  Laterality: N/A;  . Cardiac catheterization N/A 06/21/2015    Procedure: Coronary Stent Intervention;  Surgeon: Belva Crome, MD;  Location: [place] [facility];  Service: Cardiovascular;  Laterality: N/A;  . Cardiac catheterization N/A 06/21/2015    Procedure: Intravascular Ultrasound/IVUS;  Surgeon: Belva Crome, MD;  Location: [place] [facility];  Service: Cardiovascular;  Laterality: N/A;  LAD  . Cardiac catheterization  06/22/2015    Procedure: Coronary/Graft Angiography;  Surgeon: Peter M Martinique, MD;  Location: [place] [facility];  Service: Cardiovascular;;    Family History  Problem Relation Age of Onset  . Heart attack Neg Hx   . Heart failure Neg Hx   .  Heart disease Neg Hx   . Stroke Neg Hx   . Diabetes Neg Hx   . Hyperlipidemia Neg Hx   . Hypertension Neg Hx     Social history:  reports that she has quit smoking. She has never used smokeless tobacco. She reports that she does not drink alcohol or use illicit drugs.  Medications:  Prior to Admission medications   Medication Sig Start Date End Date Taking? Authorizing Provider  aspirin EC 81 MG EC tablet Take 1 tablet (81 mg total) by mouth daily. 06/25/15  Yes Eileen Stanford, PA-C    atorvastatin (LIPITOR) 80 MG tablet Take 1 tablet (80 mg total) by mouth daily at 6 PM. 06/25/15  Yes Eileen Stanford, PA-C  carvedilol (COREG) 3.125 MG tablet Take 1 tablet (3.125 mg total) by mouth 2 (two) times daily with a meal. 06/25/15  Yes Eileen Stanford, PA-C  Cholecalciferol (VITAMIN D3) 50000 units CAPS Take 1 capsule by mouth once a week.   Yes Historical Provider, MD  Insulin Glargine (LANTUS SOLOSTAR) 100 UNIT/ML Solostar Pen Inject 10 Units into the skin daily at 10 pm. 06/29/15  Yes Camelia Eng Tysinger, PA-C  isosorbide mononitrate (IMDUR) 30 MG 24 hr tablet Take 0.5 tablets (15 mg total) by mouth daily. 06/25/15  Yes Eileen Stanford, PA-C  lisinopril (PRINIVIL,ZESTRIL) 2.5 MG tablet Take 1 tablet (2.5 mg total) by mouth daily. 06/29/15  Yes Camelia Eng Tysinger, PA-C  metFORMIN (GLUCOPHAGE) 1000 MG tablet Take 1 tablet (1,000 mg total) by mouth 2 (two) times daily with a meal. 06/29/15  Yes Camelia Eng Tysinger, PA-C  ticagrelor (BRILINTA) 90 MG TABS tablet Take 1 tablet (90 mg total) by mouth 2 (two) times daily. 06/25/15  Yes Eileen Stanford, PA-C     No Known Allergies  ROS:  Out of a complete 14 system review of symptoms, the patient complains only of the following symptoms, and all other reviewed systems are negative.  Chest pain, swelling in the legs Headache, numbness  Blood pressure 118/76, pulse 74, height 5\' 9"  (1.753 m), weight 243 lb 3.2 oz (110.315 kg), last menstrual period 06/25/2015.  Physical Exam  General: The patient is alert and cooperative at the time of the examination. The patient is moderately to markedly obese.  Eyes: Pupils are equal, round, and reactive to light. Discs are flat bilaterally.  Neck: The neck is supple, no carotid bruits are noted.  Respiratory: The respiratory examination is clear.  Cardiovascular: The cardiovascular examination reveals a regular rate and rhythm, no obvious murmurs or rubs are noted.  Skin: Extremities are without  significant edema.  Neurologic Exam  Mental status: The patient is alert and oriented x 3 at the time of the examination. The patient has apparent normal recent and remote memory, with an apparently normal attention span and concentration ability.  Cranial nerves: Facial symmetry is present. There is good sensation of the face to pinprick and soft touch bilaterally. The strength of the facial muscles and the muscles to head turning and shoulder shrug are normal bilaterally. Speech is well enunciated, no aphasia or dysarthria is noted. Extraocular movements are full. Visual fields are full. The tongue is midline, and the patient has symmetric elevation of the soft palate. No obvious hearing deficits are noted.  Motor: The motor testing reveals 5 over 5 strength of all 4 extremities, with exception of some weakness with a intrinsic muscles of the right hand, the right APB muscle is of normal strength and  the distal flexors of the fingers and thumb are of normal strength. Good symmetric motor tone is noted throughout.  Sensory: Sensory testing is intact to pinprick, soft touch, vibration sensation, and position sense on all 4 extremities, with exception of some slight numbness on the thumb of the right hand and the thenar eminence. No evidence of extinction is noted.  Coordination: Cerebellar testing reveals good finger-nose-finger and heel-to-shin bilaterally.  Gait and station: Gait is normal. Tandem gait is normal. Romberg is negative. No drift is seen.  Reflexes: Deep tendon reflexes are symmetric and normal bilaterally. Toes are downgoing bilaterally.   Assessment/Plan:  1. Right median neuropathy  The patient is improving with the strength, but she continues to have some numbness and discomfort in the right hand. I have given her a note to keep her out of work until 07/30/2015. I will see her back in 3 weeks. If she is not continuing to make gains with her nerve deficits, we will consider  EMG and nerve conduction study. The return of motor function makes the possibility of a compartmental syndrome less likely.  [name] MD 07/04/2015 7:58 PM  [facility] [address] [place], [place] [address]  Phone [phone] Fax [phone]

## 2015-07-04 NOTE — Patient Instructions (Signed)
Non-ST Segment Elevation Heart Attack A heart attack (myocardial infarction) happens when some of the heart muscle is injured or dies because it does not get enough oxygen. A non-ST segment elevation heart attack is a type of heart attack. It happens when the body does not get enough oxygen because an artery carrying blood to the heart muscles (coronary artery) becomes partly or temporarily blocked. This type of heart attack is usually less severe than the type of heart attack in which a coronary artery becomes completely blocked. CAUSES The most common cause of this condition is a blocked coronary artery. A coronary artery can become blocked from a gradual buildup of cholesterol, fat, and plaque. A blood clot can form over the plaque and block blood flow. RISK FACTORS This condition is more likely to develop in:  Smokers.  Males.  Older adults.  Overweight and obese adults.  People with high blood pressure (hypertension), high cholesterol, or diabetes.  People with a family history of heart disease.  People who do not get enough exercise.  People who are under a lot of stress.  People who drink too much alcohol.  People who use illegal street drugs that increase the heart rate, such as cocaine and methamphetamines. SYMPTOMS Symptoms of this condition include:  Chest pain or a feeling of pressure in the chest. It may feel like something is crushing or squeezing the chest.  Discomfort in the upper back or in the area between the shoulder blades.  Upper back pain.  Tingling in the hands and arms.  Shortness of breath.  Heartburn or indigestion.  Sudden cold sweats.  Unexplained sweating.  Sudden lightheadedness.  Unexplained feelings of nervousness or anxiety.  Feeling of tiredness, or not feeling well. DIAGNOSIS This condition is diagnosed based on a person's signs and symptoms and a physical exam. You may also have tests done, including:  Blood tests.  A chest  X-ray.  An test to measure the electrical activity of the heart (electrocardiogram).  A test that uses sound waves to produce a picture of the heart (echocardiogram).  A test to look at the heart arteries (coronary angiogram). If you are still having chest pain after 12-24 hours, or if your health care providers think your heart is at risk, you may have a procedure called cardiac catheterization. In this procedure, a long, thin tube is inserted into an artery in your groin and moved up to the arteries in your heart. This procedure helps your health care provider figure out the source of the problem.  TREATMENT This condition may be treated with:  Bed rest in the hospital.  Medicines to relieve chest pain.  Medicines to protect the heart.  If you have a blockage, a procedure in which the artery is opened (angioplasty) and a stent is placed to keep the artery open. After initial treatment you may need to take medicine to:  Keep your blood from clotting too easily.  Control your blood pressure.  Lower your cholesterol.  Control abnormal heart rhythms (arrhythmias). HOME CARE INSTRUCTIONS  Take medicines only as directed by your health care provider.  Do not take the following medicines unless your health care provider approves:  Nonsteroidal anti-inflammatory drugs (NSAIDs), such as ibuprofen, naproxen, or celecoxib.  Vitamin supplements that contain vitamin A, vitamin E, or both.  Hormone replacement therapy that contains estrogen with or without progestin.  Make lifestyle changes as directed by your health care provider. These may include:  Using no tobacco products, including cigarettes,   chewing tobacco, and electronic cigarettes. If you are struggling to quit, ask your health care provider for help.  Exercising as directed by your health care provider. Ask for a list of activities that are safe for you.  Eating a heart-healthy diet. Work with a Firefighter to  learn healthy eating options.  Maintaining a healthy weight.  Managing other medical conditions, like diabetes.  Reducing stress.  Limiting how much alcohol you drink as directed by your health care provider. SEEK IMMEDIATE MEDICAL CARE IF:  You have any symptoms of this condition.   This information is not intended to replace advice given to you by your health care provider. Make sure you discuss any questions you have with your health care provider.   Document Released: 09/09/2004 Document Revised: 05/05/2011 Document Reviewed: 01/18/2014 Elsevier Interactive Patient Education 2016 [place] is a temporary loss of nerve function. It does not cause permanent damage to a nerve. If your foot "falls asleep," that is a type of neurapraxia. It will go away as soon as you start moving your foot. A more serious neurapraxia could take up to 6 weeks to go away. CAUSES   Anything that strains a nerve can cause neurapraxia. The nerve might be stretched or twisted. Something might press or pound on it and cause decreased blood flow to the nerve. Causes of neurapraxia can include:  Bones that break (fracture) or move out of place (dislocation). This can cause the nerves to stretch or twist out of their normal position. This happens most often in the arms and shoulders.  Neck strain when the head moves suddenly, such as in a car crash. This is called whiplash (cervical neurapraxia). It can also happen while playing sports. For example, a football injury called a stinger is a type of neurapraxia. It causes severe pain to shoot down an arm.  Stretching the neck too far from the shoulder. This damages the nerves that go into the shoulder, arm, and hand. It causes numbness and muscle weakness. This condition is called brachial plexus neurapraxia.  Repeated or prolonged pressure can cause decreased blood flow to the nerve (ischemic neurapraxia). Such causes of neurapraxia can  include:  A cast or bandage that is too tight around an injured arm or leg. This limits blood flow to the nerves and causes a condition called compartment syndrome. This condition develops when pressure builds up around muscles and nerves.  Infection and disease. This can cut off blood flow to the nerve.  Very cold temperatures.  Sleeping in a position that limits blood flow to your leg or arm. SIGNS AND SYMPTOMS  Numbness and tingling.  Muscle weakness.  Burning pain.  Cool skin. DIAGNOSIS  Neurapraxia is diagnosed through:  A physical exam. This will include asking questions about your health. Your health care provider will ask about any symptoms you are having. Your health care provider may also:  Test how strong your muscles are.  Check feeling (sensation) in various areas of your body. A very light touch or pricks with a pin may be used.  Check whether you have signs of nerve damage on one side of the body or both.  Tests such as:  Electromyography (EMG). This test measures electrical activity in a muscle. It shows whether the nerve that supplies the muscle is working.  Nerve conduction studies (NCS). They measure the flow of electricity through a nerve.  Magnetic resonance imaging (MRI). This is a machine that uses magnets and a computer  to create pictures of your nerves. TREATMENT  Treatment aims to ease pain and swelling and to provide support while your body heals.  Medicine may include:  Pain medicine.  Antidepressants.  Seizure medicine.  Medicine to reduce swelling.  For support, options may include:  Braces, walkers, or crutches.  Physical therapy. Having a specialist work with you often speeds healing. It can also help prevent stiffness and future damage.  Surgery. This may be needed if broken or dislocated bones are part of the problem. Surgery also may be done to relieve pressure on nerves or to restore normal blood flow to nerves and  muscles.  Electrical stimulators. These devices send pulses of electricity into the muscles. The aim is to bring back movement. HOME CARE INSTRUCTIONS What you need to do at home will vary. It will depend on your treatment plan and the type of neurapraxia you have. In general:  Take medicine as told by your health care provider. Follow the directions carefully.  Rest. Give your body time to heal.  Use any splints, braces, or other support devices as directed. If you have questions about their use, ask your health care provider.  Start physical therapy, if that is suggested. Ask if it is okay to practice the exercises at home, too.  If areas of your body are numb, take care to protect them from burns or other injury.  If you had surgery, you will need to care for your surgical cut (incision). Ask for instructions before you leave the hospital.  Keep all follow-up appointments with your health care provider. SEEK MEDICAL CARE IF:   You have any questions about your medicine.  Numbness or muscle weakness continues.  Pain continues, even after taking pain medicine. SEEK IMMEDIATE MEDICAL CARE IF:   Your pain suddenly becomes severe.  Numbness or weakness gets much worse.  Your muscles start to twitch or you have muscle spasms.   This information is not intended to replace advice given to you by your health care provider. Make sure you discuss any questions you have with your health care provider.   Document Released: 07/15/2010 Document Revised: 03/03/2014 Document Reviewed: 08/14/2014 Elsevier Interactive Patient Education [employer].

## 2015-07-06 ENCOUNTER — Encounter: Payer: Self-pay | Admitting: *Deleted

## 2015-07-11 ENCOUNTER — Other Ambulatory Visit: Payer: Self-pay | Admitting: Medical

## 2015-07-11 ENCOUNTER — Telehealth: Payer: Self-pay

## 2015-07-11 MED ORDER — VITAMIN D3 1.25 MG (50000 UT) PO CAPS: 1.0000 | ORAL_CAPSULE | ORAL | Status: DC

## 2015-07-11 MED ORDER — PEN NEEDLES 32G X 4 MM MISC: 1.0000 | Freq: Every day | Status: DC

## 2015-07-11 NOTE — Telephone Encounter (Signed)
Fax rcvd from [facility] needs 90 day fill of Vit D and pen needles. Jennifer Cervantes

## 2015-07-11 NOTE — Telephone Encounter (Signed)
Vit D sent

## 2015-07-11 NOTE — Telephone Encounter (Signed)
Filled pen needles. Doesn't seem that we have filled Vit D is that ok?

## 2015-07-12 ENCOUNTER — Telehealth: Payer: Self-pay

## 2015-07-12 MED ORDER — INSULIN GLARGINE 100 UNIT/ML SOLOSTAR PEN: 10.0000 [IU] | PEN_INJECTOR | Freq: Every day | SUBCUTANEOUS | Status: DC

## 2015-07-12 MED ORDER — CARVEDILOL 3.125 MG PO TABS: 3.1250 mg | ORAL_TABLET | Freq: Two times a day (BID) | ORAL | Status: DC

## 2015-07-12 MED ORDER — TICAGRELOR 90 MG PO TABS: 90.0000 mg | ORAL_TABLET | Freq: Two times a day (BID) | ORAL | Status: DC

## 2015-07-12 MED ORDER — METFORMIN HCL 1000 MG PO TABS: 1000.0000 mg | ORAL_TABLET | Freq: Two times a day (BID) | ORAL | Status: DC

## 2015-07-12 MED ORDER — ATORVASTATIN CALCIUM 80 MG PO TABS: 80.0000 mg | ORAL_TABLET | Freq: Every day | ORAL | Status: DC

## 2015-07-12 MED ORDER — ISOSORBIDE MONONITRATE ER 30 MG PO TB24: 15.0000 mg | ORAL_TABLET | Freq: Every day | ORAL | Status: DC

## 2015-07-12 NOTE — Telephone Encounter (Signed)
done

## 2015-07-12 NOTE — Telephone Encounter (Signed)
Fax rcvd for pt requesting 90 day supply.  All prescriptions recently sent to [facility] as follows:  atorvastatin on 06/25/15 for 90 day supply,  lantus 06/29/15,  Metformin 06/29/15 30 days,  Brilinta 06/25/15 90 days,  isosorbide 30 days 06/25/15 and  carvedilol 06/25/15 for 30 days.   Pt requesting all rx to be sent to [facility] for 90 days, Pharmacy updated in system.

## 2015-07-13 ENCOUNTER — Telehealth: Payer: Self-pay

## 2015-07-13 MED ORDER — VITAMIN D3 1.25 MG (50000 UT) PO CAPS: 1.0000 | ORAL_CAPSULE | ORAL | Status: DC

## 2015-07-13 MED ORDER — PEN NEEDLES 32G X 4 MM MISC: 1.0000 | Freq: Every day | Status: DC

## 2015-07-13 NOTE — Telephone Encounter (Signed)
Pt needed refills [facility] originally sent

## 2015-07-14 ENCOUNTER — Telehealth: Payer: Self-pay | Admitting: Physician Assistant

## 2015-07-14 DIAGNOSIS — Z7982 Long term (current) use of aspirin: Secondary | ICD-10-CM | POA: Diagnosis not present

## 2015-07-14 DIAGNOSIS — Z79899 Other long term (current) drug therapy: Secondary | ICD-10-CM | POA: Diagnosis not present

## 2015-07-14 DIAGNOSIS — Z7902 Long term (current) use of antithrombotics/antiplatelets: Secondary | ICD-10-CM | POA: Diagnosis not present

## 2015-07-14 DIAGNOSIS — N939 Abnormal uterine and vaginal bleeding, unspecified: Secondary | ICD-10-CM | POA: Diagnosis not present

## 2015-07-14 DIAGNOSIS — Z7984 Long term (current) use of oral hypoglycemic drugs: Secondary | ICD-10-CM | POA: Diagnosis not present

## 2015-07-14 DIAGNOSIS — I252 Old myocardial infarction: Secondary | ICD-10-CM | POA: Diagnosis not present

## 2015-07-14 DIAGNOSIS — E119 Type 2 diabetes mellitus without complications: Secondary | ICD-10-CM | POA: Diagnosis not present

## 2015-07-14 DIAGNOSIS — T426X5A Adverse effect of other antiepileptic and sedative-hypnotic drugs, initial encounter: Secondary | ICD-10-CM | POA: Diagnosis not present

## 2015-07-14 DIAGNOSIS — Z955 Presence of coronary angioplasty implant and graft: Secondary | ICD-10-CM | POA: Diagnosis not present

## 2015-07-14 DIAGNOSIS — T45525A Adverse effect of antithrombotic drugs, initial encounter: Secondary | ICD-10-CM | POA: Diagnosis not present

## 2015-07-14 NOTE — Telephone Encounter (Signed)
Patient called to report vaginal blood clots/excessive bleeding.  Her family took her to [facility] and the MD told her to stop the [place].  She refused since she just had two stents to the LAD three weeks ago.   Her Hgb apparently was fine.  She will continue to monitor.  If it persists, she will come to [facility] or [facility] but will not stop the brilinta.  Tarri Fuller [facility]

## 2015-07-16 NOTE — Progress Notes (Signed)
Cardiology Office Note    Date:  07/18/2015   ID:  Jennifer Cervantes, DOB [birth year], MRN [id number]  PCP:  Crisoforo Oxford, PA-C  Cardiologist: Dr. Fransisca Kaufmann hospital follow up.    History of Present Illness:  Jennifer Cervantes is a 34 y.o. female with a history of uncontrolled DM, morbid obesity, tobacco abuse and recently diagnosed CAD s/p overlapping DES x2 who presents to clinic for follow up.   She initially presented to [facility] with chest pain. She ruled in for MI and ECG with diffuse non specific ST elevations. 2D ECHO showed EF 45%. She was transferred to [facility] for coronary angiography. She underwent LHC on 06/21/15 which showed 75% occl mid-dist LCx, 40% occl mRCA, 99% mid-dist LAD s/p DES and 50% occl mLAD s/p DES (overlapping stents) and significant LV dysfunction with anteroapical HK. She had continued chest pain and went for re-look cath on 06/22/15 via the radial approach which showed patent stents and no evidence of thrombus, distal embolization, or dissection, side branches intact. She was started on ASA/Brilinta as well as low dose Coreg, lisinopril and imdur; however, lisinopril was discontinued due to soft BPs and dizziness. Fortunately, Repeat 2D ECHO on 06/25/15 showed normalization of EF to 55-60% and mild HK of the apicoseptal myocardium. She was also noted to have mild-mod TR. Her hospital course was complicated by post cath right radial artery occlusion and neuropraxia. After her second cardiac cath, she noted increasing swelling in her forearm and mild pain and weakness in her right hand. Arterial dopplers c/w radial artery occlusion.Dr. Bridgett Larsson with vascular surgery was consulted who felt that she was not a good surgical candidate for a thrombectomy due to need for DAPT. He recommended RUE wrist-brachial index in 3 months to follow the perfusion of this right arm. Venous duplex negative for DVT. No evidence of compartment syndrome. She continued to have issues with right  finger movement and neurology was consulted. They felt that she had right median nerve mononeuropathy with neuropraxia after prolonged compression due to radial approach for cardiac catheterization. They recommended wrist splint and elevation. Unclear if median nerve was permanently damaged or simply stunned from procedure. She has followed up with neurology post hospital and is wearing a brace.  Apparently went to [facility] for vaginal blood clots/excessive bleeding. She was told to stop her Brilinta but fortunately she refused.   Today she presents to clinic for follow up. Still having some intermittent chest pain that is similar to her pain that was post cath. It is nothing like the chest pain prior to her heart attack. She is in cardiac rehab and they are pushing her hard. She does like it but gets some exertional SOB with moderate exercise. This is getting better with time. She sometimes get some LE edema but no orthopnea or PND. No dizziness or syncope. She is feeling good in terms energy. She quit smoking but did smoke 1 cig and it made her feel sick so she quit again.  She has been following with neurology and has been out of work due to issues with her right hand weakness. She is able to use her hand again fortunately.   Past Medical History  Diagnosis Date  . Uncontrolled diabetes mellitus ([place])     a. Type II  . CAD (coronary artery disease)     a. LHC on 06/21/15 which showed 75% occl mid-dist LCx, 40% occl mRCA, 99% mid-dist LAD s/p DES and 50% occl mLAD s/p  DES (overlapping stents) and significant LV dysfunction with anteroapical HK  . Tobacco abuse   . Obesity   . Myocardial stunning (HCC)     a. EF 45% on 2D ECHO on 06/21/15 and severe LV dysfunction on LV gram by cath. Repeat 2D ECHO with EF 55-60% and mild HK of the apicoseptal myocardium.  . Tricuspid regurgitation   . Neuropraxia of right median nerve     a. suspected after right radial cath. will follow with neuro outpatient if  does not resolve.   . Hypertension   . Hyperlipidemia   . Diabetes mellitus without complication ([place])   . NSTEMI (non-ST elevated myocardial infarction) [facility]) 05/2015    [facility]    Past Surgical History  Procedure Laterality Date  . Cardiac catheterization N/A 06/21/2015    Procedure: Left Heart Cath and Coronary Angiography;  Surgeon: Belva Crome, MD;  Location: [place] CV LAB;  Service: Cardiovascular;  Laterality: N/A;  . Cardiac catheterization N/A 06/21/2015    Procedure: Coronary Stent Intervention;  Surgeon: Belva Crome, MD;  Location: [place] CV LAB;  Service: Cardiovascular;  Laterality: N/A;  . Cardiac catheterization N/A 06/21/2015    Procedure: Intravascular Ultrasound/IVUS;  Surgeon: Belva Crome, MD;  Location: [place] CV LAB;  Service: Cardiovascular;  Laterality: N/A;  LAD  . Cardiac catheterization  06/22/2015    Procedure: Coronary/Graft Angiography;  Surgeon: Peter M Martinique, MD;  Location: [place] CV LAB;  Service: Cardiovascular;;    Current Medications: Outpatient Prescriptions Prior to Visit  Medication Sig Dispense Refill  . aspirin 81 MG EC tablet Take 1 tablet (81 mg total) by mouth daily. 30 tablet   . atorvastatin (LIPITOR) 80 MG tablet Take 1 tablet (80 mg total) by mouth daily at 6 PM. 90 tablet 2  . carvedilol (COREG) 3.125 MG tablet Take 1 tablet (3.125 mg total) by mouth 2 (two) times daily with a meal. 180 tablet 1  . Cholecalciferol (VITAMIN D3) 50000 units CAPS Take 1 capsule by mouth once a week. 12 capsule 1  . gabapentin (NEURONTIN) 300 MG capsule Take 1 capsule (300 mg total) by mouth 2 (two) times daily. 60 capsule 2  . Insulin Glargine (LANTUS SOLOSTAR) 100 UNIT/ML Solostar Pen Inject 10 Units into the skin daily at 10 pm. 5 pen 2  . Insulin Pen Needle (PEN NEEDLES) 32G X 4 MM MISC 1 each by Does not apply route daily. Use with Lantus pen 100 each 5  . isosorbide mononitrate (IMDUR) 30 MG 24 hr tablet Take 0.5 tablets (15  mg total) by mouth daily. 90 tablet 1  . lisinopril (PRINIVIL,ZESTRIL) 2.5 MG tablet Take 1 tablet (2.5 mg total) by mouth daily. 30 tablet 2  . metFORMIN (GLUCOPHAGE) 1000 MG tablet Take 1 tablet (1,000 mg total) by mouth 2 (two) times daily with a meal. 180 tablet 1  . ticagrelor (BRILINTA) 90 MG TABS tablet Take 1 tablet (90 mg total) by mouth 2 (two) times daily. 180 tablet 6   No facility-administered medications prior to visit.     Allergies:   Review of patient's allergies indicates no known allergies.   Social History   Social History  . Marital Status: Single    Spouse Name: N/A  . Number of Children: 4  . Years of Education: 12   Occupational History  . Nurse [employer]   Social History Main Topics  . Smoking status: Former Smoker -- 0.50 packs/day for 23 years  .  Smokeless tobacco: Never Used  . Alcohol Use: No  . Drug Use: No  . Sexual Activity: Not Asked   Other Topics Concern  . None   Social History Narrative   Lives in [place], works nights at [employer]   Right-handed   [place] 1 soda a day     Family History:  The patient's family history is negative for Heart attack, Heart failure, Heart disease, Stroke, Diabetes, Hyperlipidemia, and Hypertension.   ROS:   Please see the history of present illness.    ROS All other systems reviewed and are negative.   PHYSICAL EXAM:   VS:  BP 124/80 mmHg  Pulse 92  Ht 5\' 9"  (1.753 m)  Wt 240 lb 3.2 oz (108.954 kg)  BMI 35.46 kg/m2  LMP 06/25/2015   GEN: Well nourished, well developed, in no acute distressObese HEENT: normal Neck: no JVD, carotid bruits, or masses Cardiac: RRR; no murmurs, rubs, or gallops,no edema  Respiratory:  clear to auscultation bilaterally, normal work of breathing GI: soft, nontender, nondistended, + BS MS: no deformity or atrophy Skin: warm and dry, no rash Neuro:  Alert and Oriented x 3, Strength and sensation are intact Psych: euthymic mood, full affect  Wt Readings from Last 3  Encounters:  07/18/15 240 lb 3.2 oz (108.954 kg)  07/04/15 243 lb 3.2 oz (110.315 kg)  06/29/15 241 lb (109.317 kg)      Studies/Labs Reviewed:   EKG:  EKG is ordered today.  The ekg ordered today demonstrates NSR with HR 92. Non specific ST/TW changes improved from previous tracing.   Recent Labs: 06/21/2015: TSH 0.184* 06/22/2015: ALT 25 06/24/2015: BUN 12; Creatinine, Ser 0.72; Potassium 4.7; Sodium 140 06/25/2015: Hemoglobin 10.9*; Platelets 172   Lipid Panel    Component Value Date/Time   CHOL 152 06/22/2015 0033   TRIG 137 06/22/2015 0033   HDL 30* 06/22/2015 0033   CHOLHDL 5.1 06/22/2015 0033   VLDL 27 06/22/2015 0033   [place] 95 06/22/2015 0033    Additional studies/ records that were reviewed today include:   2D ECHO: 06/21/2015 Study Conclusions - Left ventricle: The cavity size was normal. Wall thickness was  normal. Systolic function is mildly reduced, EF 45%. Doppler  parameters are consistent with abnormal left ventricular  relaxation (grade 1 diastolic dysfunction). Indeterminate filling  pressures. - Regional wall motion abnormality: Severe hypokinesis of the  apical septal and apical myocardium; hypokinesis of the mid  anteroseptal myocardium; moderate hypokinesis of the apical  lateral myocardium. - Mitral valve: There was mild regurgitation. - Tricuspid valve: There was mild regurgitation. _____________  [facility] 06/21/15 Procedures    Coronary Stent Intervention   Intravascular Ultrasound/IVUS   Left Heart Cath and Coronary Angiography    Conclusion     Mid Cx to Dist Cx lesion, 75% stenosed.  Mid RCA lesion, 40% stenosed.  There is moderate to severe left ventricular systolic dysfunction.  Mid LAD to Dist LAD lesion, 99% stenosed. Post intervention, there is a 10% residual stenosis.  Mid LAD lesion, 50% stenosed. Post intervention, there is a 0% residual stenosis.   Acute coronary syndrome with 99% thrombus filled  stenosis in the mid LAD.  Moderate distal circumflex stenosis. Mild mid right coronary stenosis.  Successful angioplasty and stenting of the mid to proximal LAD using overlapping stents over a 50 mm segment postdilated to 3.75 mm in diameter.  Significant left ventricular dysfunction with anteroapical severe hypokinesis and EF 35%  RECOMMENDATIONS:   Aspirin and Brilinta  Continue bivalirudin times an additional 30-45 minutes  Repeat echocardiogram in 48 hours to reassess anteroapical wall motion  Post MI risk factor modification including high-intensity statin therapy, aggressive diabetes control, smoking cessation, beta blocker and ACE inhibitor therapy, and encouragement but his pain in phase 1 cardiac rehabilitation and phase II cardiac rehabilitation with a goal towards weight reduction.  Estimate possible discharge Saturday morning, after echo, assuming no complications.      _____________  Relook cath 06/02/15 Procedures    Coronary/Graft Angiography    Conclusion     Mid Cx to Dist Cx lesion, 75% stenosed.  Mid LAD to Dist LAD lesion, 10% stenosed. The lesion was previously treated with a drug-eluting stent.  Mid RCA lesion, 40% stenosed.  The LAD stented segment is widely patent. No evidence of thrombus, distal embolization, or dissection. Side branches are intact. Other vessels are unchanged.  Plan: Medical management.    _____________  2D ECHO: 06/25/2015 LV EF: 55% - 60% Study Conclusions - Left ventricle: The cavity size was normal. Systolic function was  normal. The estimated ejection fraction was in the range of 55%  to 60%. Very mild hypokinesis of the apicoseptal myocardium. Left  ventricular diastolic function parameters were normal. - Mitral valve: There was mild to moderate regurgitation directed  centrally. - Left atrium: The atrium was mildly dilated. - Tricuspid valve: There was mild-moderate regurgitation  directed  centrally. - Pericardium, extracardiac: A trivial pericardial effusion was  identified.    ASSESSMENT:    1. Coronary artery disease involving native coronary artery of native heart without angina pectoris   2. Myocardial stunning ([place])   3. Tricuspid regurgitation   4. Type 2 diabetes mellitus with complication, unspecified long term insulin use status ([place])   5. Occlusion of radial artery (HCC)   6. Neuropraxia of median nerve, right, subsequent encounter   7. Obesity   8. Tobacco abuse   9. Menorrhagia with regular cycle      PLAN:  In order of problems listed above:  CAD/NSTEMI: She underwent LHC on 06/21/15 which showed 75% occl mid-dist LCx, 40% occl mRCA, 99% mid-dist LAD s/p DES and 50% occl mLAD s/p DES (overlapping stents) and significant LV dysfunction with anteroapical HK.  - She had continued chest pain and went for re-look cath on 06/22/15 which showed patent stents and no evidence of thrombus, distal embolization, or dissection, side branches intact. - Continue Coreg 3.125mg  BID and Imdur 15mg  daily. She was dizzy and ACE was discontinued. Continue ASA and Brilinta. - Continue cardiac rehabilitation  Myocardial stunning: EF 45% on 2D ECHO on 06/21/15 and severe LV dysfunction on LV gram by cath. Repeat 2D ECHO with EF 55-60% and mild HK of the apicoseptal myocardium. Likely due to myocardial stunning from myocardial infarction.   Tricuspid regurgitation: mild-mod regurgitation directed centrally. Follow clinically. No s/s CHF  DMT2: uncontrolled. HGA1c ~9. Continue home Metformin.   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 After her second cardiac cath, she noted increasing swelling in her forearm and mild pain and weakness in her right hand. Arterial dopplers c/w radial artery occlusion.Dr. Bridgett Larsson with vascular surgery was consulted who felt that she was not a good surgical candidate for a  thrombectomy due to need for DAPT. He recommended RUE wrist-brachial index in 3 months to follow the perfusion of this right arm. This has been arranged.  Neuropraxia/Right hand weakness: she is being followed by neurology and  is wearing an arm brace. She has full function of her hand again but has been out of work due to this.  Obesity: weight loss recommended. He has been working hard on this at cardiac rehabilitation.  Tobacco abuse: she quit smoking but then tried one cigarette which made her feel sick fortunately. She has not had any more cigarettes.  Menorrhagia: She did have heavy menstrual bleeding while on aspirin and Brilinta. She was told to stop her Brilinta at [facility]. Fortunately, she did not listen to the ER doctor. I've advised her to set an appointment with her OB/GYN to discuss possible birth control options/IUD to help limit bleeding while on DAPT.  Medication Adjustments/Labs and Tests Ordered: Current medicines are reviewed at length with the patient today.  Concerns regarding medicines are outlined above.  Medication changes, Labs and Tests ordered today are listed in the Patient Instructions below. Patient Instructions  Medication Instructions:  Your physician recommends that you continue on your current medications as directed. Please refer to the Current Medication list given to you today.   Labwork: None ordered  Testing/Procedures: None ordered3  Follow-Up: Your physician recommends that you schedule a follow-up appointment in:   [place]   Any Other Special Instructions Will Be Listed Below (If Applicable).     If you need a refill on your cardiac medications before your next appointment, please call your pharmacy.       Signed, Angelena Form, PA-C  07/18/2015 4:02 PM    [facility] [address], [place], [place]  [address] Phone: [phone]; Fax: [phone]

## 2015-07-17 ENCOUNTER — Other Ambulatory Visit: Payer: Self-pay | Admitting: Internal Medicine

## 2015-07-17 MED ORDER — ASPIRIN 81 MG PO TBEC: 81.0000 mg | DELAYED_RELEASE_TABLET | Freq: Every day | ORAL | Status: DC

## 2015-07-17 NOTE — Telephone Encounter (Signed)
Is this ok to refill? Never filled, pt was seen here for a H F/U New pt visit on 06/29/15

## 2015-07-17 NOTE — Telephone Encounter (Signed)
Refill request for aspirin to [facility]

## 2015-07-18 ENCOUNTER — Encounter: Payer: Self-pay | Admitting: Physician Assistant

## 2015-07-18 ENCOUNTER — Ambulatory Visit (INDEPENDENT_AMBULATORY_CARE_PROVIDER_SITE_OTHER): Payer: 59 | Admitting: Physician Assistant

## 2015-07-18 VITALS — BP 124/80 | HR 92 | Ht 69.0 in | Wt 240.2 lb

## 2015-07-18 DIAGNOSIS — I2510 Atherosclerotic heart disease of native coronary artery without angina pectoris: Secondary | ICD-10-CM | POA: Diagnosis not present

## 2015-07-18 DIAGNOSIS — I515 Myocardial degeneration: Secondary | ICD-10-CM

## 2015-07-18 DIAGNOSIS — E669 Obesity, unspecified: Secondary | ICD-10-CM

## 2015-07-18 DIAGNOSIS — E118 Type 2 diabetes mellitus with unspecified complications: Secondary | ICD-10-CM | POA: Diagnosis not present

## 2015-07-18 DIAGNOSIS — S5411XD Injury of median nerve at forearm level, right arm, subsequent encounter: Secondary | ICD-10-CM

## 2015-07-18 DIAGNOSIS — Z720 Tobacco use: Secondary | ICD-10-CM

## 2015-07-18 DIAGNOSIS — I071 Rheumatic tricuspid insufficiency: Secondary | ICD-10-CM | POA: Diagnosis not present

## 2015-07-18 DIAGNOSIS — I70208 Unspecified atherosclerosis of native arteries of extremities, other extremity: Secondary | ICD-10-CM

## 2015-07-18 DIAGNOSIS — I5189 Other ill-defined heart diseases: Secondary | ICD-10-CM

## 2015-07-18 DIAGNOSIS — N920 Excessive and frequent menstruation with regular cycle: Secondary | ICD-10-CM

## 2015-07-18 DIAGNOSIS — I742 Embolism and thrombosis of arteries of the upper extremities: Secondary | ICD-10-CM

## 2015-07-18 NOTE — Patient Instructions (Signed)
Medication Instructions:  Your physician recommends that you continue on your current medications as directed. Please refer to the Current Medication list given to you today.   Labwork: None ordered  Testing/Procedures: None ordered3  Follow-Up: Your physician recommends that you schedule a follow-up appointment in:   [place]   Any Other Special Instructions Will Be Listed Below (If Applicable).     If you need a refill on your cardiac medications before your next appointment, please call your pharmacy.

## 2015-07-20 ENCOUNTER — Telehealth: Payer: Self-pay

## 2015-07-20 NOTE — Telephone Encounter (Signed)
error 

## 2015-07-23 ENCOUNTER — Encounter (HOSPITAL_COMMUNITY): Payer: Self-pay | Admitting: *Deleted

## 2015-07-23 ENCOUNTER — Emergency Department (HOSPITAL_COMMUNITY): Payer: 59

## 2015-07-23 ENCOUNTER — Observation Stay (HOSPITAL_COMMUNITY)
Admission: EM | Admit: 2015-07-23 | Discharge: 2015-07-24 | Disposition: A | Discharge status: Home / Self Care | Payer: 59 | Attending: Cardiology | Admitting: Cardiology

## 2015-07-23 DIAGNOSIS — E785 Hyperlipidemia, unspecified: Secondary | ICD-10-CM | POA: Insufficient documentation

## 2015-07-23 DIAGNOSIS — E669 Obesity, unspecified: Secondary | ICD-10-CM | POA: Insufficient documentation

## 2015-07-23 DIAGNOSIS — R000 Tachycardia, unspecified: Secondary | ICD-10-CM | POA: Diagnosis not present

## 2015-07-23 DIAGNOSIS — R9431 Abnormal electrocardiogram [ECG] [EKG]: Secondary | ICD-10-CM | POA: Insufficient documentation

## 2015-07-23 DIAGNOSIS — I071 Rheumatic tricuspid insufficiency: Secondary | ICD-10-CM | POA: Diagnosis not present

## 2015-07-23 DIAGNOSIS — I2510 Atherosclerotic heart disease of native coronary artery without angina pectoris: Secondary | ICD-10-CM | POA: Insufficient documentation

## 2015-07-23 DIAGNOSIS — Z79899 Other long term (current) drug therapy: Secondary | ICD-10-CM | POA: Insufficient documentation

## 2015-07-23 DIAGNOSIS — I252 Old myocardial infarction: Secondary | ICD-10-CM | POA: Diagnosis not present

## 2015-07-23 DIAGNOSIS — I25119 Atherosclerotic heart disease of native coronary artery with unspecified angina pectoris: Secondary | ICD-10-CM | POA: Diagnosis present

## 2015-07-23 DIAGNOSIS — S5411XA Injury of median nerve at forearm level, right arm, initial encounter: Secondary | ICD-10-CM | POA: Diagnosis present

## 2015-07-23 DIAGNOSIS — R0789 Other chest pain: Secondary | ICD-10-CM | POA: Diagnosis not present

## 2015-07-23 DIAGNOSIS — R079 Chest pain, unspecified: Secondary | ICD-10-CM

## 2015-07-23 DIAGNOSIS — Z6835 Body mass index (BMI) 35.0-35.9, adult: Secondary | ICD-10-CM | POA: Insufficient documentation

## 2015-07-23 DIAGNOSIS — Z7984 Long term (current) use of oral hypoglycemic drugs: Secondary | ICD-10-CM | POA: Insufficient documentation

## 2015-07-23 DIAGNOSIS — Z794 Long term (current) use of insulin: Secondary | ICD-10-CM | POA: Insufficient documentation

## 2015-07-23 DIAGNOSIS — I742 Embolism and thrombosis of arteries of the upper extremities: Secondary | ICD-10-CM | POA: Diagnosis present

## 2015-07-23 DIAGNOSIS — I340 Nonrheumatic mitral (valve) insufficiency: Secondary | ICD-10-CM | POA: Diagnosis not present

## 2015-07-23 DIAGNOSIS — Z7982 Long term (current) use of aspirin: Secondary | ICD-10-CM | POA: Insufficient documentation

## 2015-07-23 DIAGNOSIS — E119 Type 2 diabetes mellitus without complications: Secondary | ICD-10-CM | POA: Insufficient documentation

## 2015-07-23 DIAGNOSIS — E088 Diabetes mellitus due to underlying condition with unspecified complications: Secondary | ICD-10-CM | POA: Diagnosis present

## 2015-07-23 DIAGNOSIS — I10 Essential (primary) hypertension: Secondary | ICD-10-CM | POA: Insufficient documentation

## 2015-07-23 DIAGNOSIS — Z720 Tobacco use: Secondary | ICD-10-CM | POA: Diagnosis present

## 2015-07-23 DIAGNOSIS — Z87891 Personal history of nicotine dependence: Secondary | ICD-10-CM | POA: Diagnosis not present

## 2015-07-23 HISTORY — DX: Type 2 diabetes mellitus without complications: E11.9

## 2015-07-23 HISTORY — DX: Embolism and thrombosis of arteries of the upper extremities: I74.2

## 2015-07-23 LAB — BASIC METABOLIC PANEL
ANION GAP: 8 (ref 5–15)
BUN: 14 mg/dL (ref 6–20)
CALCIUM: 9.5 mg/dL (ref 8.9–10.3)
CO2: 24 mmol/L (ref 22–32)
CREATININE: 0.92 mg/dL (ref 0.44–1.00)
Chloride: 104 mmol/L (ref 101–111)
GFR calc non Af Amer: >60 mL/min (ref >60)
Glucose, Bld: 92 mg/dL (ref 65–99)
Potassium: 3.4 mmol/L — ABNORMAL LOW (ref 3.5–5.1)
SODIUM: 136 mmol/L (ref 135–145)

## 2015-07-23 LAB — TROPONIN I
Troponin I: <0.03 ng/mL (ref <0.031)
Troponin I: <0.03 ng/mL (ref <0.031)
Troponin I: <0.03 ng/mL (ref <0.031)

## 2015-07-23 LAB — CBC
HCT: 33 % — ABNORMAL LOW (ref 36.0–46.0)
HEMOGLOBIN: 11.2 g/dL — AB (ref 12.0–15.0)
MCH: 30.5 pg (ref 26.0–34.0)
MCHC: 33.9 g/dL (ref 30.0–36.0)
MCV: 89.9 fL (ref 78.0–100.0)
PLATELETS: 295 10*3/uL (ref 150–400)
RBC: 3.67 MIL/uL — AB (ref 3.87–5.11)
RDW: 12.9 % (ref 11.5–15.5)
WBC: 11.2 10*3/uL — AB (ref 4.0–10.5)

## 2015-07-23 LAB — HEPARIN LEVEL (UNFRACTIONATED): Heparin Unfractionated: 0.18 IU/mL — ABNORMAL LOW (ref 0.30–0.70)

## 2015-07-23 LAB — PROTIME-INR
INR: 1.07 (ref 0.00–1.49)
Prothrombin Time: 14.1 seconds (ref 11.6–15.2)

## 2015-07-23 LAB — GLUCOSE, CAPILLARY
GLUCOSE-CAPILLARY: 181 mg/dL — AB (ref 65–99)
Glucose-Capillary: 86 mg/dL (ref 65–99)
Glucose-Capillary: 169 mg/dL — ABNORMAL HIGH (ref 65–99)

## 2015-07-23 MED ORDER — ASPIRIN 81 MG PO CHEW
324.0000 mg | CHEWABLE_TABLET | ORAL | Status: AC
  Administered 2015-07-24: 324 mg via ORAL
  Filled 2015-07-23: qty 4

## 2015-07-23 MED ORDER — HEPARIN (PORCINE) IN NACL 100-0.45 UNIT/ML-% IJ SOLN
1550.0000 [IU]/h | INTRAMUSCULAR | Status: DC
  Administered 2015-07-23: 1350 [IU]/h via INTRAVENOUS
  Filled 2015-07-23 (×2): qty 250

## 2015-07-23 MED ORDER — TICAGRELOR 90 MG PO TABS
90.0000 mg | ORAL_TABLET | Freq: Two times a day (BID) | ORAL | Status: DC
  Administered 2015-07-23 – 2015-07-24 (×3): 90 mg via ORAL
  Filled 2015-07-23 (×3): qty 1

## 2015-07-23 MED ORDER — ALPRAZOLAM 0.25 MG PO TABS: 0.2500 mg | ORAL_TABLET | Freq: Two times a day (BID) | ORAL | Status: DC | PRN

## 2015-07-23 MED ORDER — SODIUM CHLORIDE 0.9% FLUSH
3.0000 mL | Freq: Two times a day (BID) | INTRAVENOUS | Status: DC
  Administered 2015-07-23: 3 mL via INTRAVENOUS

## 2015-07-23 MED ORDER — ASPIRIN 300 MG RE SUPP: 300.0000 mg | RECTAL | Status: AC

## 2015-07-23 MED ORDER — CARVEDILOL 3.125 MG PO TABS
3.1250 mg | ORAL_TABLET | Freq: Two times a day (BID) | ORAL | Status: DC
  Administered 2015-07-23 – 2015-07-24 (×2): 3.125 mg via ORAL
  Filled 2015-07-23 (×3): qty 1

## 2015-07-23 MED ORDER — SODIUM CHLORIDE 0.9 % WEIGHT BASED INFUSION: 1.0000 mL/kg/h | INTRAVENOUS | Status: DC

## 2015-07-23 MED ORDER — POTASSIUM CHLORIDE ER 10 MEQ PO TBCR
40.0000 meq | EXTENDED_RELEASE_TABLET | ORAL | Status: AC
  Administered 2015-07-23 (×2): 40 meq via ORAL
  Filled 2015-07-23 (×3): qty 4

## 2015-07-23 MED ORDER — ASPIRIN 81 MG PO CHEW
324.0000 mg | CHEWABLE_TABLET | ORAL | Status: AC
  Administered 2015-07-23: 324 mg via ORAL
  Filled 2015-07-23: qty 4

## 2015-07-23 MED ORDER — NITROGLYCERIN 2 % TD OINT
1.0000 [in_us] | TOPICAL_OINTMENT | Freq: Three times a day (TID) | TRANSDERMAL | Status: DC
  Administered 2015-07-23 – 2015-07-24 (×3): 1 [in_us] via TOPICAL
  Filled 2015-07-23: qty 30

## 2015-07-23 MED ORDER — HEPARIN BOLUS VIA INFUSION
2000.0000 [IU] | Freq: Once | INTRAVENOUS | Status: AC
  Administered 2015-07-23: 2000 [IU] via INTRAVENOUS
  Filled 2015-07-23: qty 2000

## 2015-07-23 MED ORDER — NITROGLYCERIN 0.4 MG SL SUBL
0.4000 mg | SUBLINGUAL_TABLET | SUBLINGUAL | Status: DC | PRN
  Administered 2015-07-23 (×3): 0.4 mg via SUBLINGUAL
  Filled 2015-07-23: qty 1

## 2015-07-23 MED ORDER — SODIUM CHLORIDE 0.9% FLUSH: 3.0000 mL | INTRAVENOUS | Status: DC | PRN

## 2015-07-23 MED ORDER — HEPARIN BOLUS VIA INFUSION
4000.0000 [IU] | Freq: Once | INTRAVENOUS | Status: AC
  Administered 2015-07-23: 4000 [IU] via INTRAVENOUS
  Filled 2015-07-23: qty 4000

## 2015-07-23 MED ORDER — SODIUM CHLORIDE 0.9% FLUSH
3.0000 mL | INTRAVENOUS | Status: DC | PRN
Start: 2015-07-23 — End: 2015-07-24

## 2015-07-23 MED ORDER — LISINOPRIL 2.5 MG PO TABS
2.5000 mg | ORAL_TABLET | Freq: Every day | ORAL | Status: DC
  Administered 2015-07-23 – 2015-07-24 (×2): 2.5 mg via ORAL
  Filled 2015-07-23 (×2): qty 1

## 2015-07-23 MED ORDER — SODIUM CHLORIDE 0.9 % IV SOLN: 250.0000 mL | INTRAVENOUS | Status: DC | PRN

## 2015-07-23 MED ORDER — ONDANSETRON HCL 4 MG/2ML IJ SOLN: 4.0000 mg | Freq: Four times a day (QID) | INTRAMUSCULAR | Status: DC | PRN

## 2015-07-23 MED ORDER — INSULIN ASPART 100 UNIT/ML ~~LOC~~ SOLN
0.0000 [IU] | Freq: Three times a day (TID) | SUBCUTANEOUS | Status: DC
  Administered 2015-07-24: 2 [IU] via SUBCUTANEOUS

## 2015-07-23 MED ORDER — ASPIRIN EC 81 MG PO TBEC
81.0000 mg | DELAYED_RELEASE_TABLET | Freq: Every day | ORAL | Status: DC
  Filled 2015-07-23: qty 1

## 2015-07-23 MED ORDER — ATORVASTATIN CALCIUM 80 MG PO TABS
80.0000 mg | ORAL_TABLET | Freq: Every day | ORAL | Status: DC
  Administered 2015-07-23: 80 mg via ORAL
  Filled 2015-07-23 (×2): qty 1

## 2015-07-23 MED ORDER — SODIUM CHLORIDE 0.9 % WEIGHT BASED INFUSION
3.0000 mL/kg/h | INTRAVENOUS | Status: DC
  Administered 2015-07-24: 3 mL/kg/h via INTRAVENOUS

## 2015-07-23 MED ORDER — ACETAMINOPHEN 325 MG PO TABS: 650.0000 mg | ORAL_TABLET | ORAL | Status: DC | PRN

## 2015-07-23 MED ORDER — ZOLPIDEM TARTRATE 5 MG PO TABS: 5.0000 mg | ORAL_TABLET | Freq: Every evening | ORAL | Status: DC | PRN

## 2015-07-23 MED ORDER — INSULIN GLARGINE 100 UNIT/ML ~~LOC~~ SOLN
10.0000 [IU] | Freq: Every day | SUBCUTANEOUS | Status: DC
  Administered 2015-07-23: 10 [IU] via SUBCUTANEOUS
  Filled 2015-07-23 (×2): qty 0.1

## 2015-07-23 NOTE — ED Notes (Signed)
[facility] arrived and is at bedside to transport patient.

## 2015-07-23 NOTE — Progress Notes (Signed)
ANTICOAGULATION CONSULT NOTE - Follow Up Consult  Pharmacy Consult for heparin Indication: chest pain/ACS  No Known Allergies  Patient Measurements: Height: 5\' 9"  (175.3 cm) Weight: 240 lb 4.8 oz (108.999 kg) IBW/kg (Calculated) : 66.2 Heparin Dosing Weight: 90 kg  Vital Signs: Temp: 98.2 F (36.8 C) (05/29 2139) Temp Source: Oral (05/29 2139) BP: 108/60 mmHg (05/29 2139) Pulse Rate: 71 (05/29 2139)  Labs:  Recent Labs  07/23/15 0139 07/23/15 0437 07/23/15 1241 07/23/15 1819 07/23/15 2000  HGB 11.2*  --   --   --   --   HCT 33.0*  --   --   --   --   PLT 295  --   --   --   --   LABPROT  --   --  14.1  --   --   INR  --   --  1.07  --   --   HEPARINUNFRC  --   --   --   --  0.18*  CREATININE 0.92  --   --   --   --   TROPONINI <0.03 <0.03 <0.03 <0.03  --     Estimated Creatinine Clearance: 111.2 mL/min (by C-G formula based on Cr of 0.92).   Medical History: Past Medical History  Diagnosis Date  . Uncontrolled diabetes mellitus ([place])     a. Type II  . CAD (coronary artery disease)     a. LHC on 06/21/15 which showed 75% occl mid-dist LCx, 40% occl mRCA, 99% mid-dist LAD s/p DES and 50% occl mLAD s/p DES (overlapping stents) and significant LV dysfunction with anteroapical HK  . Tobacco abuse   . Obesity   . Myocardial stunning (HCC)     a. EF 45% on 2D ECHO on 06/21/15 and severe LV dysfunction on LV gram by cath. Repeat 2D ECHO with EF 55-60% and mild HK of the apicoseptal myocardium.  . Tricuspid regurgitation   . Neuropraxia of right median nerve     a. suspected after right radial cath. will follow with neuro outpatient if does not resolve.   . Hypertension   . Hyperlipidemia   . Diabetes mellitus without complication ([place])   . NSTEMI (non-ST elevated myocardial infarction) [facility]) 05/2015    [place]    Medications:  Scheduled:  . [START ON 07/24/2015] aspirin  324 mg Oral Pre-Cath  . [START ON 07/24/2015] aspirin EC  81 mg Oral Daily  .  atorvastatin  80 mg Oral q1800  . carvedilol  3.125 mg Oral BID WC  . heparin  2,000 Units Intravenous Once  . insulin aspart  0-15 Units Subcutaneous TID WC  . insulin glargine  10 Units Subcutaneous Q2200  . lisinopril  2.5 mg Oral Daily  . nitroGLYCERIN  1 inch Topical Q8H  . potassium chloride  40 mEq Oral Q3H  . sodium chloride flush  3 mL Intravenous Q12H  . sodium chloride flush  3 mL Intravenous Q12H  . ticagrelor  90 mg Oral BID   Infusions:  . [START ON 07/24/2015] sodium chloride     Followed by  . [START ON 07/24/2015] sodium chloride    . heparin 1,550 Units/hr (07/23/15 2057)    Assessment: 35 yo who was admitted for CP. She has a recent hx of CAD. IV heparin has been ordered to r/o MI.  Heparin drip 1350 u/hr HL 0.18 < goal.    Goal of Therapy:  Heparin level 0.3-0.7 units/ml Monitor platelets by anticoagulation protocol:  Yes   Plan:   Heparin bolus 2000 units x1 Increase Heparin drip at 1550 units/hr Daily level and CBC  Bonnita Nasuti Pharm.D. CPP, BCPS Clinical Pharmacist [phone] 07/23/2015 10:14 PM

## 2015-07-23 NOTE — ED Notes (Signed)
Report given to [facility]. 

## 2015-07-23 NOTE — H&P (Signed)
History and Physical   Patient ID: Jennifer Cervantes MRN: [id number], DOB/AGE: [birth year] 35 y.o. Date of Encounter: 07/23/2015  Primary Physician: Crisoforo Oxford, PA-C Primary Cardiologist: Dr Tamala Julian  Chief Complaint:  Chest pain  HPI: Jennifer Cervantes is a 35 y.o. female with a history of recently diagnosed CAD, DM, tob use, obesity, R radial thrombus after relook cath, HTN, HLD.  NSTEMI 06/21/2015, initially hospitalized at [facility]>>[facility] for cath>>DES X 2 LAD w/ 75% CFX & 40% RCA treated medically. Had a different chest pain>>ECG still abnormal>>relook cath>>10% ISR, otherwise, no change. Had R radial thrombus after that, seen by [facility] and no surgery since she cannot come off Brilinta. R medial neuropathy after procedure, seen by Neuro, it is improving.   Ever since 04/28, Jennifer Cervantes has had mid-L chest pain, sharp, generally intermittent but it is prolonged at times. She has this pain multiple times per day, Tylenol no help. No other rx tried. This is not the original chest pain (although both are L chest pain). It is the pain that led to the relook cath during previous admission.  Pt wakened by a different chest pain at about midnight last pm. It was like the pain prior to her stent. First episode since d/c. She was SOB with the pain and diaphoretic, no N&V. Left chest pain, "elephant on my chest". In the ER, she took SL NTG x 2 about 3 am, with complete relief of her pain. However, the pain has returned and is now a 7/10. She is not significantly SOB. The pain is worse with deep inspiration, but her chest wall is not tender. No positional component.   Past Medical History  Diagnosis Date  . Uncontrolled diabetes mellitus ([place])     a. Type II  . CAD (coronary artery disease)     a. LHC on 06/21/15 which showed 75% occl mid-dist LCx, 40% occl mRCA, 99% mid-dist LAD s/p DES and 50% occl mLAD s/p DES (overlapping stents) and significant LV dysfunction with anteroapical HK  .  Tobacco abuse   . Obesity   . Myocardial stunning (HCC)     a. EF 45% on 2D ECHO on 06/21/15 and severe LV dysfunction on LV gram by cath. Repeat 2D ECHO with EF 55-60% and mild HK of the apicoseptal myocardium.  . Tricuspid regurgitation   . Neuropraxia of right median nerve     a. suspected after right radial cath. will follow with neuro outpatient if does not resolve.   . Hypertension   . Hyperlipidemia   . Diabetes mellitus without complication ([place])   . NSTEMI (non-ST elevated myocardial infarction) [facility]) 05/2015    [facility]    Surgical History:  Past Surgical History  Procedure Laterality Date  . Cardiac catheterization N/A 06/21/2015    Procedure: Left Heart Cath and Coronary Angiography;  Surgeon: Belva Crome, MD; pLAD 99%, CFX 75%, RCA 40%, EF 35%   . Cardiac catheterization N/A 06/21/2015    Procedure: Coronary Stent Intervention;  Surgeon: Belva Crome, MD;  Promus Premier DES, 3.0 x 20 and 3.5 x 32 mm stents to the LAD  . Cardiac catheterization N/A 06/21/2015    Procedure: Intravascular Ultrasound/IVUS;  Surgeon: Belva Crome, MD;  Location: [place] [facility];  Service: Cardiovascular;  Laterality: N/A;  LAD  . Cardiac catheterization  06/22/2015    Procedure: Coronary/Graft Angiography;  Surgeon: Peter M Martinique, MD;  Location: [place] [facility];  Service: Cardiovascular;;  I have reviewed the patient's current medications. Prior to Admission medications   Medication Sig Start Date End Date Taking? Authorizing Provider  aspirin 81 MG EC tablet Take 1 tablet (81 mg total) by mouth daily. 07/17/15   Camelia Eng Tysinger, PA-C  atorvastatin (LIPITOR) 80 MG tablet Take 1 tablet (80 mg total) by mouth daily at 6 PM. 07/12/15   Carlena Hurl, PA-C  carvedilol (COREG) 3.125 MG tablet Take 1 tablet (3.125 mg total) by mouth 2 (two) times daily with a meal. 07/12/15   Carlena Hurl, PA-C  Cholecalciferol (VITAMIN D3) 50000 units CAPS Take 1 capsule by mouth once a week.  07/13/15   Camelia Eng Tysinger, PA-C  gabapentin (NEURONTIN) 300 MG capsule Take 1 capsule (300 mg total) by mouth 2 (two) times daily. 07/04/15   Kathrynn Ducking, MD  Insulin Glargine (LANTUS SOLOSTAR) 100 UNIT/ML Solostar Pen Inject 10 Units into the skin daily at 10 pm. 07/12/15   Camelia Eng Tysinger, PA-C  Insulin Pen Needle (PEN NEEDLES) 32G X 4 MM MISC 1 each by Does not apply route daily. Use with Lantus pen 07/13/15   Camelia Eng Tysinger, PA-C  isosorbide mononitrate (IMDUR) 30 MG 24 hr tablet Take 0.5 tablets (15 mg total) by mouth daily. 07/12/15   Camelia Eng Tysinger, PA-C  lisinopril (PRINIVIL,ZESTRIL) 2.5 MG tablet Take 1 tablet (2.5 mg total) by mouth daily. 06/29/15   Camelia Eng Tysinger, PA-C  metFORMIN (GLUCOPHAGE) 1000 MG tablet Take 1 tablet (1,000 mg total) by mouth 2 (two) times daily with a meal. 07/12/15   Carlena Hurl, PA-C  ticagrelor (BRILINTA) 90 MG TABS tablet Take 1 tablet (90 mg total) by mouth 2 (two) times daily. 07/12/15   Carlena Hurl, PA-C   Scheduled Meds:  Continuous Infusions:  PRN Meds:.nitroGLYCERIN  Allergies: No Known Allergies  Social History   Social History  . Marital Status: Single    Spouse Name: N/A  . Number of Children: 4  . Years of Education: 12   Occupational History  . Nurse [employer]   Social History Main Topics  . Smoking status: Former Smoker -- 0.50 packs/day for 23 years    Types: Cigarettes    Quit date: 06/21/2015  . Smokeless tobacco: Never Used  . Alcohol Use: No  . Drug Use: No  . Sexual Activity: Not on file   Other Topics Concern  . Not on file   Social History Narrative   Lives in [place], works nights at [employer]   Right-handed   Drinks 1 soda a day    Family History  Problem Relation Age of Onset  . Heart attack Neg Hx   . Heart failure Neg Hx   . Heart disease Neg Hx   . Stroke Neg Hx   . Diabetes Neg Hx   . Hyperlipidemia Neg Hx   . Hypertension Neg Hx    Family Status  Relation Status Death Age  . Mother  Alive   . Father Alive   . Sister Alive   . Brother Alive     Review of Systems:   Full 14-point review of systems otherwise negative except as noted above.  Physical Exam: Blood pressure 111/69, pulse 66, temperature 98.2 F (36.8 C), temperature source Oral, resp. rate 18, height 5\' 9"  (1.753 m), weight 240 lb (108.863 kg), last menstrual period 06/25/2015, SpO2 100 %. General: Well developed, well nourished,female in no acute distress. Head: Normocephalic, atraumatic, sclera non-icteric, no xanthomas,  nares are without discharge. Dentition:  Neck: No carotid bruits. JVD not elevated. No thyromegally Lungs: Good expansion bilaterally. without wheezes or rhonchi.  Heart: IRRegular rate and rhythm with S1 S2.  No S3 or S4.  No murmur, no rubs, or gallops appreciated. Abdomen: Soft, non-tender, non-distended with normoactive bowel sounds. No hepatomegaly. No rebound/guarding. No obvious abdominal masses. Msk:  Strength and tone appear normal for age. No joint deformities or effusions, no spine or costo-vertebral angle tenderness. Extremities: No clubbing or cyanosis. No edema.  Distal pedal pulses are 2+ in 4 extrem Neuro: Alert and oriented X 3. Moves all extremities spontaneously. No focal deficits noted. Psych:  Responds to questions appropriately with a normal affect. Skin: No rashes or lesions noted  Labs:   Lab Results  Component Value Date   WBC 11.2* 07/23/2015   HGB 11.2* 07/23/2015   HCT 33.0* 07/23/2015   MCV 89.9 07/23/2015   PLT 295 07/23/2015     Recent Labs Lab 07/23/15 0139  NA 136  K 3.4*  CL 104  CO2 24  BUN 14  CREATININE 0.92  CALCIUM 9.5  GLUCOSE 92    Recent Labs  07/23/15 0139 07/23/15 0437  TROPONINI <0.03 <0.03   Lab Results  Component Value Date   CHOL 152 06/22/2015   HDL 30* 06/22/2015   LDLCALC 95 06/22/2015   TRIG 137 06/22/2015   Lab Results  Component Value Date   HGBA1C 9.1* 06/22/2015    Radiology/Studies: Dg Chest 2  View 07/23/2015  CLINICAL DATA:  Central chest pain. EXAM: CHEST  2 VIEW COMPARISON:  Radiographs and CT 06/21/2015 FINDINGS: The cardiomediastinal contours are normal. Coronary stent in place. The lungs are clear. Pulmonary vasculature is normal. No consolidation, pleural effusion, or pneumothorax. No acute osseous abnormalities are seen. IMPRESSION: No acute pulmonary process.  Coronary stent in place. Electronically Signed   By: Jeb Levering M.D.   On: 07/23/2015 02:42    Cardiac Cath: 04/27 1. Mid Cx to Dist Cx lesion, 75% stenosed. 2. Mid RCA lesion, 40% stenosed. 3. There is moderate to severe left ventricular systolic dysfunction. 4. Mid LAD to Dist LAD lesion, 99% stenosed. Post intervention, there is a 10% residual stenosis. 5. Mid LAD lesion, 50% stenosed. Post intervention, there is a 0% residual stenosis.  Acute coronary syndrome with 99% thrombus filled stenosis in the mid LAD.  Moderate distal circumflex stenosis. Mild mid right coronary stenosis.  Successful angioplasty and stenting of the mid to proximal LAD using overlapping Promus Premier DES (3.0 x 20 and 3.5 x 32 mm) over a 50 mm segment postdilated to 3.75 mm in diameter.  Significant left ventricular dysfunction with anteroapical severe hypokinesis and EF 35%  Echo: 06/25/2015 - Left ventricle: The cavity size was normal. Systolic function was  normal. The estimated ejection fraction was in the range of 55%  to 60%. Very mild hypokinesis of the apicoseptal myocardium. Left  ventricular diastolic function parameters were normal. - Mitral valve: There was mild to moderate regurgitation directed  centrally. - Left atrium: The atrium was mildly dilated. - Tricuspid valve: There was mild-moderate regurgitation directed  centrally. - Pericardium, extracardiac: A trivial pericardial effusion was  identified.   ECG: 05/29 SR, minor ST changes from most recent ECG  ASSESSMENT AND PLAN:  Active Problems:    Chest pain - no objective evidence of ischemia - however her ECG is a little different - she has been compliant with Brilinta - continue heparin and nitrates, may need  cath.  Jonetta Speak, PA-C 07/23/2015 12:07 PM Beeper [phone]  As above, patient seen and examined. Briefly she is a 35 year old female with past medical history of diabetes mellitus and coronary artery disease with chest pain. Patient recently had drug-eluting stent to the LAD 2 in the setting of a non-ST elevation myocardial infarction. Repeat catheterization showed no in-stent restenosis. She did have radial thrombus in her right upper extremity following catheterization. She has had occasional chest pain since her stent that is not exertional. However, Last evening she awoke with chest pain similar to her infarct. It was described as a pressure and burning sensation associated with diaphoresis and dyspnea. No nausea.The pain lasted for approximately 1 hour. She was seen in [place] and transferred for further management. She had some Recurrent pain earlier today with some improvement with nitroglycerin. Electrocardiogram shows sinus rhythm with worsening T-wave inversion anteriorly. Initial enzymes negative. Difficult situation. However given that her description is similar to previous infarct pain feel definitive evaluation is indicated. Continue to cycle enzymes. Proceed with cardiac catheterization tomorrow morning.The risks and benefits were discussed and she agrees to proceed. Continue aspirin, brilinta and statin; add IV heparin and NTG. Kirk Ruths

## 2015-07-23 NOTE — ED Notes (Signed)
Pt given Diet Coke per request. Pt reports 2/10 CP at this time.

## 2015-07-23 NOTE — Progress Notes (Addendum)
Patient stated she is having chest pain, 8/10. One nitro has been given. EKG done. BP was 111/69 and heart rate was 66. Will continue to monitor.  Pain went down to a 2 out of 10 with one nitro.

## 2015-07-23 NOTE — ED Provider Notes (Signed)
CSN: [id number]     Arrival date & time 07/23/15  0024 History   First MD Initiated Contact with Patient 07/23/15 0157     Chief Complaint  Patient presents with  . Chest Pain     (Consider location/radiation/quality/duration/timing/severity/associated sxs/prior Treatment) HPI Comments: Pt with hx of CAd s/p stent x 2 placed (4/27 and 4/28) who comes in to the Er with chest pain. Chest pain started 2 hours prior to ER arrival and is described as "something is sitting on her" and burning pain. Pain is similar to her MI pain. She reports that the pain is constant, woke her up from her sleep and has no specific aggravating or relieving factors. PT has been taking her meds as prescribed. Associated diophoresis at home and mild dib.   ROS 10 Systems reviewed and are negative for acute change except as noted in the HPI.     Patient is a 35 y.o. female presenting with chest pain. The history is provided by the patient.  Chest Pain   Past Medical History  Diagnosis Date  . Uncontrolled diabetes mellitus ([place])     a. Type II  . CAD (coronary artery disease)     a. LHC on 06/21/15 which showed 75% occl mid-dist LCx, 40% occl mRCA, 99% mid-dist LAD s/p DES and 50% occl mLAD s/p DES (overlapping stents) and significant LV dysfunction with anteroapical HK  . Tobacco abuse   . Obesity   . Myocardial stunning (HCC)     a. EF 45% on 2D ECHO on 06/21/15 and severe LV dysfunction on LV gram by cath. Repeat 2D ECHO with EF 55-60% and mild HK of the apicoseptal myocardium.  . Tricuspid regurgitation   . Neuropraxia of right median nerve     a. suspected after right radial cath. will follow with neuro outpatient if does not resolve.   . Hypertension   . Hyperlipidemia   . Diabetes mellitus without complication ([place])   . NSTEMI (non-ST elevated myocardial infarction) [facility]) 05/2015    [facility]   Past Surgical History  Procedure Laterality Date  . Cardiac catheterization N/A 06/21/2015   Procedure: Left Heart Cath and Coronary Angiography;  Surgeon: Belva Crome, MD; pLAD 99%, CFX 75%, RCA 40%, EF 35%   . Cardiac catheterization N/A 06/21/2015    Procedure: Coronary Stent Intervention;  Surgeon: Belva Crome, MD;  Promus Premier DES, 3.0 x 20 and 3.5 x 32 mm stents to the LAD  . Cardiac catheterization N/A 06/21/2015    Procedure: Intravascular Ultrasound/IVUS;  Surgeon: Belva Crome, MD;  Location: [place] [facility];  Service: Cardiovascular;  Laterality: N/A;  LAD  . Cardiac catheterization  06/22/2015    Procedure: Coronary/Graft Angiography;  Surgeon: Peter M Martinique, MD;  Location: [place] [facility];  Service: Cardiovascular;;   Family History  Problem Relation Age of Onset  . Heart attack Neg Hx   . Heart failure Neg Hx   . Heart disease Neg Hx   . Stroke Neg Hx   . Diabetes Neg Hx   . Hyperlipidemia Neg Hx   . Hypertension Neg Hx    Social History  Substance Use Topics  . Smoking status: Former Smoker -- 0.50 packs/day for 23 years    Types: Cigarettes    Quit date: 06/21/2015  . Smokeless tobacco: Never Used  . Alcohol Use: No   OB History    No data available     Review of Systems  Cardiovascular: Positive  for chest pain.      Allergies  Review of patient's allergies indicates no known allergies.  Home Medications   Prior to Admission medications   Medication Sig Start Date End Date Taking? Authorizing Provider  acetaminophen (TYLENOL) 500 MG tablet Take 500 mg by mouth every 6 (six) hours as needed for headache (pain).   Yes Historical Provider, MD  aspirin 81 MG EC tablet Take 1 tablet (81 mg total) by mouth daily. 07/17/15  Yes Camelia Eng Tysinger, PA-C  atorvastatin (LIPITOR) 80 MG tablet Take 1 tablet (80 mg total) by mouth daily at 6 PM. 07/12/15  Yes Camelia Eng Tysinger, PA-C  carvedilol (COREG) 3.125 MG tablet Take 1 tablet (3.125 mg total) by mouth 2 (two) times daily with a meal. 07/12/15  Yes Camelia Eng Tysinger, PA-C  Cholecalciferol (VITAMIN  D3) 50000 units CAPS Take 1 capsule by mouth once a week. Patient taking differently: Take 1 capsule by mouth every Friday.  07/13/15  Yes Camelia Eng Tysinger, PA-C  Insulin Glargine (LANTUS SOLOSTAR) 100 UNIT/ML Solostar Pen Inject 10 Units into the skin daily at 10 pm. 07/12/15  Yes Camelia Eng Tysinger, PA-C  isosorbide mononitrate (IMDUR) 30 MG 24 hr tablet Take 0.5 tablets (15 mg total) by mouth daily. 07/12/15  Yes Camelia Eng Tysinger, PA-C  lisinopril (PRINIVIL,ZESTRIL) 2.5 MG tablet Take 1 tablet (2.5 mg total) by mouth daily. 06/29/15  Yes Camelia Eng Tysinger, PA-C  metFORMIN (GLUCOPHAGE) 1000 MG tablet Take 1 tablet (1,000 mg total) by mouth 2 (two) times daily with a meal. 07/12/15  Yes Camelia Eng Tysinger, PA-C  ticagrelor (BRILINTA) 90 MG TABS tablet Take 1 tablet (90 mg total) by mouth 2 (two) times daily. 07/12/15  Yes Camelia Eng Tysinger, PA-C  gabapentin (NEURONTIN) 300 MG capsule Take 1 capsule (300 mg total) by mouth 2 (two) times daily. Patient not taking: Reported on 07/23/2015 07/04/15   Kathrynn Ducking, MD  Insulin Pen Needle (PEN NEEDLES) 32G X 4 MM MISC 1 each by Does not apply route daily. Use with Lantus pen 07/13/15   Camelia Eng Tysinger, PA-C   BP 108/60 mmHg  Pulse 71  Temp(Src) 98.2 F (36.8 C) (Oral)  Resp 17  Ht 5\' 9"  (1.753 m)  Wt 240 lb 4.8 oz (108.999 kg)  BMI 35.47 kg/m2  SpO2 100%  LMP 06/25/2015 Physical Exam  Constitutional: She is oriented to person, place, and time. She appears well-developed.  HENT:  Head: Normocephalic and atraumatic.  Eyes: Conjunctivae and EOM are normal. Pupils are equal, round, and reactive to light.  Neck: Normal range of motion. Neck supple.  Cardiovascular: Normal rate, regular rhythm and normal heart sounds.   Pulmonary/Chest: Effort normal and breath sounds normal. No respiratory distress.  Abdominal: Soft. Bowel sounds are normal. She exhibits no distension. There is no tenderness. There is no rebound and no guarding.  Neurological: She is alert  and oriented to person, place, and time.  Skin: Skin is warm and dry.  Nursing note and vitals reviewed.   ED Course  Procedures (including critical care time) Labs Review Labs Reviewed  BASIC METABOLIC PANEL - Abnormal; Notable for the following:    Potassium 3.4 (*)    All other components within normal limits  CBC - Abnormal; Notable for the following:    WBC 11.2 (*)    RBC 3.67 (*)    Hemoglobin 11.2 (*)    HCT 33.0 (*)    All other components within normal limits  GLUCOSE,  CAPILLARY - Abnormal; Notable for the following:    Glucose-Capillary 181 (*)    All other components within normal limits  HEPARIN LEVEL (UNFRACTIONATED) - Abnormal; Notable for the following:    Heparin Unfractionated 0.18 (*)    All other components within normal limits  GLUCOSE, CAPILLARY - Abnormal; Notable for the following:    Glucose-Capillary 169 (*)    All other components within normal limits  TROPONIN I  TROPONIN I  TROPONIN I  TROPONIN I  PROTIME-INR  GLUCOSE, CAPILLARY  TROPONIN I  COMPREHENSIVE METABOLIC PANEL  HEPARIN LEVEL (UNFRACTIONATED)  CBC    Imaging Review Dg Chest 2 View  07/23/2015  CLINICAL DATA:  Central chest pain. EXAM: CHEST  2 VIEW COMPARISON:  Radiographs and CT 06/21/2015 FINDINGS: The cardiomediastinal contours are normal. Coronary stent in place. The lungs are clear. Pulmonary vasculature is normal. No consolidation, pleural effusion, or pneumothorax. No acute osseous abnormalities are seen. IMPRESSION: No acute pulmonary process.  Coronary stent in place. Electronically Signed   By: Jeb Levering M.D.   On: 07/23/2015 02:42   I have personally reviewed and evaluated these images and lab results as part of my medical decision-making.   EKG Interpretation   Date/Time:  Monday Jul 23 2015 00:35:43 EDT Ventricular Rate:  109 PR Interval:  168 QRS Duration: 86 QT Interval:  322 QTC Calculation: 433 R Axis:   60 Text Interpretation:  Sinus tachycardia  Nonspecific T wave abnormality  Abnormal ECG No acute changes Nonspecific ST and T wave abnormality  Confirmed by Clinton Dragone, MD, Sherrilynn Gudgel ([id number]) on 07/23/2015 1:35:25 AM      MDM   Final diagnoses:  Chest pain, unspecified chest pain type   Pt comes in with cc of chest pain. Chest pain is typical - and resolved post 2 SL nitro in the ER. We will admit her to Cards service given her recent STEMI and PCI placement.   Varney Biles, MD 07/24/15 [id number]

## 2015-07-23 NOTE — Progress Notes (Signed)
ANTICOAGULATION CONSULT NOTE - Initial Consult  Pharmacy Consult for heparin Indication: chest pain/ACS  No Known Allergies  Patient Measurements: Height: 5\' 9"  (175.3 cm) Weight: 240 lb (108.863 kg) IBW/kg (Calculated) : 66.2 Heparin Dosing Weight: 90 kg  Vital Signs: Temp: 98.2 F (36.8 C) (05/29 0900) Temp Source: Oral (05/29 0900) BP: 111/69 mmHg (05/29 1149) Pulse Rate: 66 (05/29 1149)  Labs:  Recent Labs  07/23/15 0139 07/23/15 0437  HGB 11.2*  --   HCT 33.0*  --   PLT 295  --   CREATININE 0.92  --   TROPONINI <0.03 <0.03    Estimated Creatinine Clearance: 111.2 mL/min (by C-G formula based on Cr of 0.92).   Medical History: Past Medical History  Diagnosis Date  . Uncontrolled diabetes mellitus ([place])     a. Type II  . CAD (coronary artery disease)     a. LHC on 06/21/15 which showed 75% occl mid-dist LCx, 40% occl mRCA, 99% mid-dist LAD s/p DES and 50% occl mLAD s/p DES (overlapping stents) and significant LV dysfunction with anteroapical HK  . Tobacco abuse   . Obesity   . Myocardial stunning (HCC)     a. EF 45% on 2D ECHO on 06/21/15 and severe LV dysfunction on LV gram by cath. Repeat 2D ECHO with EF 55-60% and mild HK of the apicoseptal myocardium.  . Tricuspid regurgitation   . Neuropraxia of right median nerve     a. suspected after right radial cath. will follow with neuro outpatient if does not resolve.   . Hypertension   . Hyperlipidemia   . Diabetes mellitus without complication ([place])   . NSTEMI (non-ST elevated myocardial infarction) ([place]) 05/2015    [facility]    Medications:  Scheduled:  . aspirin  324 mg Oral NOW   Or  . aspirin  300 mg Rectal NOW  . [START ON 07/24/2015] aspirin EC  81 mg Oral Daily  . atorvastatin  80 mg Oral q1800  . carvedilol  3.125 mg Oral BID WC  . insulin aspart  0-15 Units Subcutaneous TID WC  . insulin glargine  10 Units Subcutaneous Q2200  . lisinopril  2.5 mg Oral Daily  . nitroGLYCERIN  1 inch Topical  Q8H  . sodium chloride flush  3 mL Intravenous Q12H  . ticagrelor  90 mg Oral BID   Infusions:    Assessment: 35 yo who was admitted for CP. She has a recent hx of CAD. IV heparin has been ordered to r/o MI.   Goal of Therapy:  Heparin level 0.3-0.7 units/ml Monitor platelets by anticoagulation protocol: Yes   Plan:   Heparin bolus 4000 units x1 Heparin drip at 1350 units/hr F/u with 6 hr heparin level Daily level and CBC  Onnie Boer, PharmD Pager: [phone] 07/23/2015 12:40 PM

## 2015-07-23 NOTE — Progress Notes (Signed)
Patient admitted and oriented to the room. EKG was done. CCMD was notified. Physician was paged. Call light and phone are within reach.

## 2015-07-23 NOTE — ED Notes (Signed)
Pt c/o central chest pain; pt states she had 2 heart stents places x 1 month ago

## 2015-07-23 NOTE — ED Notes (Signed)
Chest pain 10/10 first sublingual nitro given. BP 119/81

## 2015-07-24 ENCOUNTER — Inpatient Hospital Stay (HOSPITAL_BASED_OUTPATIENT_CLINIC_OR_DEPARTMENT_OTHER): Payer: 59

## 2015-07-24 ENCOUNTER — Inpatient Hospital Stay (HOSPITAL_COMMUNITY): Payer: 59

## 2015-07-24 ENCOUNTER — Encounter (HOSPITAL_COMMUNITY): Payer: Self-pay | Admitting: Nurse Practitioner

## 2015-07-24 ENCOUNTER — Encounter (HOSPITAL_COMMUNITY)
Admission: EM | Disposition: A | Discharge status: Home / Self Care | Payer: Self-pay | Source: Home / Self Care | Attending: Emergency Medicine

## 2015-07-24 DIAGNOSIS — E119 Type 2 diabetes mellitus without complications: Secondary | ICD-10-CM | POA: Diagnosis not present

## 2015-07-24 DIAGNOSIS — E785 Hyperlipidemia, unspecified: Secondary | ICD-10-CM | POA: Diagnosis present

## 2015-07-24 DIAGNOSIS — Z79899 Other long term (current) drug therapy: Secondary | ICD-10-CM | POA: Diagnosis not present

## 2015-07-24 DIAGNOSIS — R0789 Other chest pain: Secondary | ICD-10-CM | POA: Diagnosis not present

## 2015-07-24 DIAGNOSIS — Z6835 Body mass index (BMI) 35.0-35.9, adult: Secondary | ICD-10-CM | POA: Diagnosis not present

## 2015-07-24 DIAGNOSIS — I252 Old myocardial infarction: Secondary | ICD-10-CM | POA: Diagnosis not present

## 2015-07-24 DIAGNOSIS — Z7984 Long term (current) use of oral hypoglycemic drugs: Secondary | ICD-10-CM | POA: Diagnosis not present

## 2015-07-24 DIAGNOSIS — I742 Embolism and thrombosis of arteries of the upper extremities: Secondary | ICD-10-CM | POA: Diagnosis present

## 2015-07-24 DIAGNOSIS — I2510 Atherosclerotic heart disease of native coronary artery without angina pectoris: Secondary | ICD-10-CM | POA: Diagnosis not present

## 2015-07-24 DIAGNOSIS — Z7982 Long term (current) use of aspirin: Secondary | ICD-10-CM | POA: Diagnosis not present

## 2015-07-24 DIAGNOSIS — R079 Chest pain, unspecified: Secondary | ICD-10-CM | POA: Diagnosis not present

## 2015-07-24 LAB — NM MYOCAR MULTI W/SPECT W/WALL MOTION / EF
CHL CUP RESTING HR STRESS: 67 {beats}/min
CHL CUP STRESS STAGE 1 GRADE: 0 %
CHL CUP STRESS STAGE 1 HR: 71 {beats}/min
CHL CUP STRESS STAGE 1 SBP: 101 mmHg
CHL CUP STRESS STAGE 2 GRADE: 0 %
CHL CUP STRESS STAGE 2 HR: 71 {beats}/min
CHL CUP STRESS STAGE 2 SPEED: 0 mph
CHL CUP STRESS STAGE 3 DBP: 67 mmHg
CHL CUP STRESS STAGE 4 SBP: 102 mmHg
CHL CUP STRESS STAGE 5 SPEED: 0 mph
CSEPED: 6 min
CSEPEDS: 3 s
CSEPEW: 1 METS
CSEPHR: 58 %
CSEPPHR: 83 {beats}/min
LHR: 1.34
LV sys vol: 53 mL
LVDIAVOL: 122 mL (ref 46–106)
MPHR: 184 {beats}/min
NUC STRESS TID: 1.28
Percent of predicted max HR: 45 %
SDS: 2
SRS: 4
SSS: 6
Stage 1 DBP: 68 mmHg
Stage 1 Speed: 0 mph
Stage 3 Grade: 0 %
Stage 3 HR: 100 {beats}/min
Stage 3 SBP: 109 mmHg
Stage 3 Speed: 0 mph
Stage 4 DBP: 65 mmHg
Stage 4 Grade: 0 %
Stage 4 HR: 85 {beats}/min
Stage 4 Speed: 0 mph
Stage 5 Grade: 0 %
Stage 5 HR: 83 {beats}/min

## 2015-07-24 LAB — CBC
HEMATOCRIT: 34.6 % — AB (ref 36.0–46.0)
HEMOGLOBIN: 11.4 g/dL — AB (ref 12.0–15.0)
MCH: 29.3 pg (ref 26.0–34.0)
MCHC: 32.9 g/dL (ref 30.0–36.0)
MCV: 88.9 fL (ref 78.0–100.0)
Platelets: 273 10*3/uL (ref 150–400)
RBC: 3.89 MIL/uL (ref 3.87–5.11)
RDW: 13 % (ref 11.5–15.5)
WBC: 7.5 10*3/uL (ref 4.0–10.5)

## 2015-07-24 LAB — COMPREHENSIVE METABOLIC PANEL
ALT: 18 U/L (ref 14–54)
AST: 20 U/L (ref 15–41)
Albumin: 4 g/dL (ref 3.5–5.0)
Alkaline Phosphatase: 73 U/L (ref 38–126)
Anion gap: 6 (ref 5–15)
BILIRUBIN TOTAL: 0.5 mg/dL (ref 0.3–1.2)
BUN: 10 mg/dL (ref 6–20)
CHLORIDE: 107 mmol/L (ref 101–111)
CO2: 23 mmol/L (ref 22–32)
CREATININE: 0.81 mg/dL (ref 0.44–1.00)
Calcium: 9.8 mg/dL (ref 8.9–10.3)
Glucose, Bld: 111 mg/dL — ABNORMAL HIGH (ref 65–99)
Potassium: 4.1 mmol/L (ref 3.5–5.1)
Sodium: 136 mmol/L (ref 135–145)
Total Protein: 7.6 g/dL (ref 6.5–8.1)

## 2015-07-24 LAB — GLUCOSE, CAPILLARY
GLUCOSE-CAPILLARY: 120 mg/dL — AB (ref 65–99)
Glucose-Capillary: 140 mg/dL — ABNORMAL HIGH (ref 65–99)
Glucose-Capillary: 78 mg/dL (ref 65–99)

## 2015-07-24 LAB — HEPARIN LEVEL (UNFRACTIONATED): HEPARIN UNFRACTIONATED: 0.37 [IU]/mL (ref 0.30–0.70)

## 2015-07-24 LAB — TROPONIN I: Troponin I: <0.03 ng/mL (ref <0.031)

## 2015-07-24 SURGERY — LEFT HEART CATH AND CORONARY ANGIOGRAPHY
Anesthesia: LOCAL

## 2015-07-24 MED ORDER — TECHNETIUM TC 99M TETROFOSMIN IV KIT
10.0000 | PACK | Freq: Once | INTRAVENOUS | Status: AC | PRN
  Administered 2015-07-24: 10 via INTRAVENOUS

## 2015-07-24 MED ORDER — REGADENOSON 0.4 MG/5ML IV SOLN
0.4000 mg | Freq: Once | INTRAVENOUS | Status: AC
  Administered 2015-07-24: 0.4 mg via INTRAVENOUS
  Filled 2015-07-24: qty 5

## 2015-07-24 MED ORDER — TECHNETIUM TC 99M TETROFOSMIN IV KIT
30.0000 | PACK | Freq: Once | INTRAVENOUS | Status: AC | PRN
  Administered 2015-07-24: 30 via INTRAVENOUS

## 2015-07-24 MED ORDER — GI COCKTAIL ~~LOC~~: 30.0000 mL | Freq: Three times a day (TID) | ORAL | Status: DC | PRN

## 2015-07-24 MED ORDER — REGADENOSON 0.4 MG/5ML IV SOLN
INTRAVENOUS | Status: AC
  Administered 2015-07-24: 0.4 mg via INTRAVENOUS
  Filled 2015-07-24: qty 5

## 2015-07-24 MED ORDER — OMEPRAZOLE MAGNESIUM 20 MG PO TBEC: 20.0000 mg | DELAYED_RELEASE_TABLET | Freq: Every day | ORAL | Status: DC

## 2015-07-24 MED ORDER — NITROGLYCERIN 0.4 MG SL SUBL: 0.4000 mg | SUBLINGUAL_TABLET | SUBLINGUAL | Status: DC | PRN

## 2015-07-24 NOTE — Progress Notes (Signed)
    Subjective:  Denies dyspnea; still with "a little" CP   Objective:  Filed Vitals:   07/23/15 1810 07/23/15 2139 07/24/15 0500 07/24/15 0548  BP: 103/66 108/60  110/55  Pulse: 74 71  68  Temp:  98.2 F (36.8 C)  98.2 F (36.8 C)  TempSrc:  Oral  Oral  Resp:  17  20  Height:      Weight:   239 lb 10.2 oz (108.7 kg)   SpO2:  100%      Intake/Output from previous day: No intake or output data in the 24 hours ending 07/24/15 0817  Physical Exam: Physical exam: Well-developed well-nourished in no acute distress.  Skin is warm and dry.  HEENT is normal.  Neck is supple.  Chest is clear to auscultation with normal expansion.  Cardiovascular exam is regular rate and rhythm.  Abdominal exam nontender or distended. No masses palpated. Extremities show no edema. neuro grossly intact    Lab Results: Basic Metabolic Panel:  Recent Labs  07/23/15 0139 07/24/15 0037  NA 136 136  K 3.4* 4.1  CL 104 107  CO2 24 23  GLUCOSE 92 111*  BUN 14 10  CREATININE 0.92 0.81  CALCIUM 9.5 9.8   CBC:  Recent Labs  07/23/15 0139 07/24/15 0037  WBC 11.2* 7.5  HGB 11.2* 11.4*  HCT 33.0* 34.6*  MCV 89.9 88.9  PLT 295 273   Cardiac Enzymes:  Recent Labs  07/23/15 1241 07/23/15 1819 07/24/15 0037  TROPONINI <0.03 <0.03 <0.03     Assessment/Plan:  1 chest pain-patient symptoms have now been present for 36 hours. Her enzymes are negative. I therefore think ischemia is unlikely. We will plan to cancel cardiac catheterization and instead screen with a nuclear study. If it shows infarct but no ischemia we will treat medically. DC heparin 2 coronary artery disease-continue aspirin, statin and bilinta. Continue metoprolol and ACEI. 3 hypertension-continue present medications. 4 hyperlipidemia-continue statin. If nuclear study shows infarct and no ischemia we will discharge her today and she will follow-up with Dr. Tamala Julian. > 30 min PA and physician time D2  Kirk Ruths 07/24/2015, 8:17 AM

## 2015-07-24 NOTE — Progress Notes (Signed)
Pt had Lexiscan MV without complication.  Chest pain 5/10 before and after the study.  1 day study, [facility] to read.  Rosaria Ferries, Hershal Coria 07/24/2015 11:31 AM Beeper [phone]

## 2015-07-24 NOTE — Care Management Note (Signed)
Case Management Note  Patient Details  Name: Jennifer Cervantes MRN: [id number] Date of Birth: 1980-11-24  Subjective/Objective:      CAD, chest pain              Action/Plan: Discharge Planning: AVS reviewed:  Chart reviewed. Pt has [employer], No NCM needs identified.   PCP- Chana Bode MD  Expected Discharge Date:  07/24/2015               Expected Discharge Plan:  Home/Self Care  In-House Referral:  NA  Discharge planning Services  CM Consult  Post Acute Care Choice:  NA Choice offered to:  NA  DME Arranged:  N/A DME Agency:  NA  HH Arranged:  NA HH Agency:  NA  Status of Service:  Completed, signed off  Medicare Important Message Given:    Date Medicare IM Given:    Medicare IM give by:    Date Additional Medicare IM Given:    Additional Medicare Important Message give by:     If discussed at [place] of Stay Meetings, dates discussed:    Additional Comments:  Erenest Rasher, RN 07/24/2015, 4:54 PM

## 2015-07-24 NOTE — Discharge Summary (Signed)
Discharge Summary    Patient ID: Jennifer Cervantes,  MRN: [id number], DOB/AGE: [birth year] 35 y.o.  Admit date: 07/23/2015 Discharge date: 07/24/2015  Primary Care Provider: Crisoforo Oxford Primary Cardiologist: Linard Millers, MD,   Discharge Diagnoses    Principal Problem:   Chest pain with high risk for cardiac etiology  **s/p recent PCI/DES to the LAD.  **Low risk lexiscan myoview this admission.  Active Problems:   CAD (coronary artery disease)   Tobacco abuse  **Quit 2017.   Obesity   Type 2 diabetes mellitus with complication, without long-term current use of insulin (HCC)   Hyperlipidemia  Allergies No Known Allergies  Diagnostic Studies/Procedures    Lexiscan Cardiolite 5.29.2017   There was no ST segment deviation noted during stress.  The study is normal.  This is a low risk study.  The left ventricular ejection fraction is normal (55-65%).   Normal lexiscan myovue with no ischemia or infarct EF 57%  _____________   History of Present Illness     35 y/o ? with a history of DMII, HTN, HL, and CAD s/p recent NSTEMI and LAD stenting.  Post-PCI course was complicated by recurrent chest pain requiring relook cath revealing stable anatomy with a patient LAD stent and non-obstructive RCA and LCX disease.  Following relook cath, she c/o right arm swelling and numbness.  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  She was not felt to be a good candidate for thrombectomy as she was s/p PCI and could not come off of asa/brilinta.  She was thus conservatively managed and recommendation was made for RUE wrist-brachial index in three months.  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  A splint was placed and she will f/u with neuro as an outpt.  Following d/c, she continued to have intermittent, sharp, left sided chest  pain, occurring several times/day, lasting up to several hours at a time.  On 5/29, she awoke with recurrent burning chest pain and heaviness, prompting her to present to the ED for evaluation.  There, she was treated with sl NTG with relief of discomfort but then returned.  Troponin was normal however, ECG showed more pronounced anterior T wave inversion.  She was admitted for further evaluation.  Hospital Course     Consultants: None   Ms. Chaffee ruled out for MI.  She continued to have intermittent chest burning and underwent lexiscan myoview stress testing, which showed normal LV function and no evidence of ischemia or infarct.  Post-stress testing, she has been feeling well and ambulating without difficulty.  She will be discharged home today in good condition and will consider taking an over the counter antacid. _____________  Discharge Vitals Blood pressure 104/59, pulse 71, temperature 98.1 F (36.7 C), temperature source Oral, resp. rate 20, height 5\' 9"  (1.753 m), weight 239 lb 10.2 oz (108.7 kg), last menstrual period 06/25/2015, SpO2 100 %.  Filed Weights   07/23/15 0033 07/23/15 1230 07/24/15 0500  Weight: 240 lb (108.863 kg) 240 lb 4.8 oz (108.999 kg) 239 lb 10.2 oz (108.7 kg)    Labs & Radiologic Studies    CBC  Recent Labs  07/23/15 0139 07/24/15 0037  WBC 11.2* 7.5  HGB 11.2* 11.4*  HCT 33.0* 34.6*  MCV 89.9 88.9  PLT 295 123456   Basic Metabolic Panel  Recent Labs  07/23/15 0139 07/24/15 0037  NA 136 136  K 3.4* 4.1  CL 104 107  CO2 24 23  GLUCOSE 92 111*  BUN 14 10  CREATININE 0.92 0.81  CALCIUM 9.5 9.8   Liver Function Tests  Recent Labs  07/24/15 0037  AST 20  ALT 18  ALKPHOS 73  BILITOT 0.5  PROT 7.6  ALBUMIN 4.0   Cardiac Enzymes  Recent Labs  07/23/15 1241 07/23/15 1819 07/24/15 0037  TROPONINI <0.03 <0.03 <0.03  _____________  Dg Chest 2 View  07/23/2015  CLINICAL DATA:  Central chest pain. EXAM: CHEST  2 VIEW COMPARISON:   Radiographs and CT 06/21/2015 FINDINGS: The cardiomediastinal contours are normal. Coronary stent in place. The lungs are clear. Pulmonary vasculature is normal. No consolidation, pleural effusion, or pneumothorax. No acute osseous abnormalities are seen. IMPRESSION: No acute pulmonary process.  Coronary stent in place. Electronically Signed   By: Jeb Levering M.D.   On: 07/23/2015 02:42   Nm Myocar Multi W/spect W/wall Motion / Ef  07/24/2015   There was no ST segment deviation noted during stress.  The study is normal.  This is a low risk study.  The left ventricular ejection fraction is normal (55-65%).  Normal lexiscan myovue with no ischemia or infarct EF 57%   Disposition   Pt is being discharged home today in good condition.  Follow-up Plans & Appointments    Follow-up Information    Follow up with Angelena Form, PA-C On 08/06/2015.   Specialties:  Cardiology, Radiology   Why:  3:00 PM   Contact information:   [address] [place] [address] [phone]       Follow up with Sinclair Grooms, MD On 10/23/2015.   Specialty:  Cardiology   Why:  11:15 AM   Contact information:   [id number] [place] [address] [phone]       Discharge Medications   Current Discharge Medication List    START taking these medications   Details  nitroGLYCERIN (NITROSTAT) 0.4 MG SL tablet Place 1 tablet (0.4 mg total) under the tongue every 5 (five) minutes as needed for chest pain. Qty: 25 tablet, Refills: 3    omeprazole (PRILOSEC OTC) 20 MG tablet Take 1 tablet (20 mg total) by mouth daily.      CONTINUE these medications which have NOT CHANGED   Details  acetaminophen (TYLENOL) 500 MG tablet Take 500 mg by mouth every 6 (six) hours as needed for headache (pain).    aspirin 81 MG EC tablet Take 1 tablet (81 mg total) by mouth daily. Qty: 30 tablet    atorvastatin (LIPITOR) 80 MG tablet Take 1 tablet (80 mg total) by mouth  daily at 6 PM. Qty: 90 tablet, Refills: 2    carvedilol (COREG) 3.125 MG tablet Take 1 tablet (3.125 mg total) by mouth 2 (two) times daily with a meal. Qty: 180 tablet, Refills: 1    Cholecalciferol (VITAMIN D3) 50000 units CAPS Take 1 capsule by mouth once a week. Qty: 12 capsule, Refills: 1    Insulin Glargine (LANTUS SOLOSTAR) 100 UNIT/ML Solostar Pen Inject 10 Units into the skin daily at 10 pm. Qty: 5 pen, Refills: 2    isosorbide mononitrate (IMDUR) 30 MG 24 hr tablet Take 0.5 tablets (15 mg total) by mouth daily. Qty: 90 tablet, Refills: 1    lisinopril (PRINIVIL,ZESTRIL) 2.5 MG tablet Take 1 tablet (2.5 mg total) by mouth daily. Qty: 30  tablet, Refills: 2    metFORMIN (GLUCOPHAGE) 1000 MG tablet Take 1 tablet (1,000 mg total) by mouth 2 (two) times daily with a meal. Qty: 180 tablet, Refills: 1    ticagrelor (BRILINTA) 90 MG TABS tablet Take 1 tablet (90 mg total) by mouth 2 (two) times daily. Qty: 180 tablet, Refills: 6    gabapentin (NEURONTIN) 300 MG capsule Take 1 capsule (300 mg total) by mouth 2 (two) times daily. Qty: 60 capsule, Refills: 2    Insulin Pen Needle (PEN NEEDLES) 32G X 4 MM MISC 1 each by Does not apply route daily. Use with Lantus pen Qty: 100 each, Refills: 5        Outstanding Labs/Studies   None   Duration of Discharge Encounter   Greater than 30 minutes including physician time.  Signed, Murray Hodgkins NP 07/24/2015, 5:15 PM

## 2015-07-24 NOTE — Progress Notes (Signed)
ANTICOAGULATION CONSULT NOTE  Pharmacy Consult for heparin Indication: chest pain/ACS  No Known Allergies  Patient Measurements: Height: 5\' 9"  (175.3 cm) Weight: 240 lb 4.8 oz (108.999 kg) IBW/kg (Calculated) : 66.2 Heparin Dosing Weight: 90 kg  Vital Signs: Temp: 98.2 F (36.8 C) (05/29 2139) Temp Source: Oral (05/29 2139) BP: 108/60 mmHg (05/29 2139) Pulse Rate: 71 (05/29 2139)  Labs:  Recent Labs  07/23/15 0139  07/23/15 1241 07/23/15 1819 07/23/15 2000 07/24/15 0037  HGB 11.2*  --   --   --   --  11.4*  HCT 33.0*  --   --   --   --  34.6*  PLT 295  --   --   --   --  273  LABPROT  --   --  14.1  --   --   --   INR  --   --  1.07  --   --   --   HEPARINUNFRC  --   --   --   --  0.18* 0.37  CREATININE 0.92  --   --   --   --  0.81  TROPONINI <0.03  < > <0.03 <0.03  --  <0.03  < > = values in this interval not displayed.  Estimated Creatinine Clearance: 126.3 mL/min (by C-G formula based on Cr of 0.81).  Assessment: 35 y.o. female with chest pain for heparin   Goal of Therapy:  Heparin level 0.3-0.7 units/ml Monitor platelets by anticoagulation protocol: Yes   Plan:  Continue Heparin at current rate  F/U after cath  Phillis Knack, PharmD, BCPS  07/24/2015 1:54 AM

## 2015-07-25 ENCOUNTER — Other Ambulatory Visit: Payer: Self-pay | Admitting: *Deleted

## 2015-07-25 ENCOUNTER — Encounter: Payer: Self-pay | Admitting: *Deleted

## 2015-07-25 VITALS — BP 102/60 | HR 95 | Ht 69.0 in | Wt 238.0 lb

## 2015-07-25 DIAGNOSIS — E119 Type 2 diabetes mellitus without complications: Secondary | ICD-10-CM

## 2015-07-25 LAB — POCT CBG (FASTING - GLUCOSE)-MANUAL ENTRY: GLUCOSE FASTING, POC: 128 mg/dL — AB (ref 70–99)

## 2015-07-25 NOTE — Addendum Note (Signed)
Addended by: Barrington Ellison on: 07/25/2015 02:35 PM   Modules accepted: Orders

## 2015-07-25 NOTE — Patient Outreach (Addendum)
[facility]) Care Management   07/25/2015  Jennifer Cervantes Jul 10, 1980 [id number]  Jennifer Cervantes is an 35 y.o. female who presents to the [place] Management office to enroll in the Link To Wellness program for self management assistance with Type II DM, CAD, obesity, HTN and hyperlipidemia.   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 She says she was just discharged from the hospital yesterday after being admitted for an overnight stay with chest pain. She says she was in the hospital in late April for chest pain that was attributed to a heart attack that required stenting of her LAD.  She says she was diagnosed with diabetes 6 years ago and has never had good control of her blood sugar. Since discharge form the hospital on May 1st she has been checking her blood sugar 3 times daily and reports a fasting variance of 90-153 and post meal variance of 125-136 since she was started on Lantus. She says she is adherent with all of her medications. She doe snot exercise but is to start cardiac rehab in late June. She says she has smoked since she was 89 years but stopped and stopped when she was in the hospital but is now struggling to remain smoke free but is requesting help with a nicotine patch. She says she doesn't  want to take another pill to help with smoking cessation. She says she has not been monitoring  her weight or her blood pressure at home as she has no scale or blood pressure monitor. She says a right wrist brace was prescribed because she had a clot in her a wrist vein post cath that caused numbness and swelling. She is seeing a neurologist and vein specialist for the wrist issue. She says she will likely return to work on 07/30/15.  Objective:   Review of Systems  Constitutional:  Negative.     Physical Exam  Constitutional: She is oriented to person, place, and time. She appears well-developed and well-nourished.  Cardiovascular: Normal rate and regular rhythm.   Respiratory: Effort normal.  Neurological: She is alert and oriented to person, place, and time.  Skin: Skin is warm and dry.  Psychiatric: She has a normal mood and affect. Her behavior is normal. Judgment and thought content normal.  She wears a right wrist brace.  Filed Vitals:   07/25/15 1020  BP: 102/60  Pulse: 95   Filed Weights   07/25/15 1020  Weight: 238 lb (107.956 kg)   Encounter Medications:   Outpatient Encounter Prescriptions as of 07/25/2015  Medication Sig  . acetaminophen (TYLENOL) 500 MG tablet Take 500 mg by mouth every 6 (six) hours as needed for headache (pain).  [name] aspirin 81 MG EC tablet Take 1 tablet (81 mg total) by mouth daily.  [name] atorvastatin (LIPITOR) 80 MG tablet Take 1 tablet (80 mg total) by mouth daily at 6 PM.  . carvedilol (COREG) 3.125 MG tablet Take 1 tablet (3.125 mg total) by mouth 2 (two) times daily with a meal.  . Cholecalciferol (VITAMIN D3) 50000 units CAPS Take 1 capsule by mouth once a week. (Patient taking differently: Take 1 capsule by mouth every Friday. )  . Insulin Glargine (LANTUS SOLOSTAR) 100 UNIT/ML Solostar Pen Inject 10 Units into the skin daily at 10  pm.  . Insulin Pen Needle (PEN NEEDLES) 32G X 4 MM MISC 1 each by Does not apply route daily. Use with Lantus pen  . isosorbide mononitrate (IMDUR) 30 MG 24 hr tablet Take 0.5 tablets (15 mg total) by mouth daily.  [name] lisinopril (PRINIVIL,ZESTRIL) 2.5 MG tablet Take 1 tablet (2.5 mg total) by mouth daily.  . metFORMIN (GLUCOPHAGE) 1000 MG tablet Take 1 tablet (1,000 mg total) by mouth 2 (two) times daily with a meal.  . nitroGLYCERIN (NITROSTAT) 0.4 MG SL tablet Place 1 tablet (0.4 mg total) under the tongue every 5 (five) minutes as needed for chest pain.  . ticagrelor (BRILINTA) 90 MG TABS tablet  Take 1 tablet (90 mg total) by mouth 2 (two) times daily.  [name] gabapentin (NEURONTIN) 300 MG capsule Take 1 capsule (300 mg total) by mouth 2 (two) times daily. (Patient not taking: Reported on 07/23/2015)  . omeprazole (PRILOSEC OTC) 20 MG tablet Take 1 tablet (20 mg total) by mouth daily. (Patient not taking: Reported on 07/25/2015)   No facility-administered encounter medications on file as of 07/25/2015.    Functional Status:   In your present state of health, do you have any difficulty performing the following activities: 07/25/2015 07/25/2015  Hearing? N N  Vision? N N  Difficulty concentrating or making decisions? N N  Walking or climbing stairs? N N  Dressing or bathing? N N  Doing errands, shopping? N N  Preparing Food and eating ? N N  Using the Toilet? N N  In the past six months, have you accidently leaked urine? N N  Do you have problems with loss of bowel control? N N  Managing your Medications? N N  Managing your Finances? N N  Housekeeping or managing your Housekeeping? N N    Fall/Depression Screening:    PHQ 2/9 Scores 07/25/2015  PHQ - 2 Score 5  PHQ- 9 Score 15    Assessment:   [employer] employee with Type II DM, CAD with recent MI and LAD stent placement, HTN, hyperlipidemia, and obesity enrolling in the Link To Wellness program for self management assistance of chronic disease states. Plan:  [facility] CM Care Plan Problem One        Most Recent Value   Care Plan Problem One  Enrolling in the Link To Wellness program for self management assistance of obesity BMI=35.15 , CAD s/p MI with stent placement in LAD, Type II DM with most recent Hgb A1C= 9.1% on 06/22/15, HTN with BP in good control at present, hyperlipidemia with low HDL = 30 per lipid panel on 4/28 and smoking cessation  but struggling to maintain no smoking status   Role Documenting the Problem One  Care Management Coordinator   Care Plan for Problem One  Active   [facility] Long Term Goal (31-90 days)  Pt will not  resume smoking, will demonstrate improved glycemic control as evidenced by CBG meeting target >80% of the time and improved Hgb A1C at next assessment, ongoing good control of HTN as evidenced by BP readings < 140/<90, improved lipid panel at next assessment and ongoing weight loss or no weight gain at next assessment     [facility] Long Term Goal Start Date  07/25/15   Interventions for Problem One Long Term Goal  Discussed Link to Wellness program goals, requirements and benefits, reviewed member's rights and responsibilities ,provided diabetes information packet with explanation of contents, ensured member agreed and signed consent to participate and authorization to release  and receive health information, consent, participation agreement and consent to enroll in program, assessed member's current knowledge of diabetes, discussed physiology of diabetes as a chronic progressive disease with the initial problem of insulin resistance in the muscle, liver and fat cells and then increased loss of beta cell function over time resulting in decreased insulin production, discussed role of obesity, especially abdominal (visceral) obesity, on insulin resistance, reviewed patient's medications and assessed medication adherence, reinforced importance of taking all medications as prescribed, encouraged her to [facility] her [facility] [place] to the [facility] to take advantage of the reduced or no cost medication and other pharmacy benefits offered to [facility] employees, discussed role of statins, aspirin, and blood pressure medicines in the treatment of diabetes, provided voucher to purchase home BP monitor for $5 at a [facility] pharmacy and encouraged her to begin monitoring her blood pressure at home at least 2-3 times weekly, assigned Emmi educational modules related to home BP monitoring, provided education on the three primary macronutrient's (CHO, protein, fat) and their effect on glucose levels,  provided  education and a handout on the Plate Method to assist with meal planning, discussed nutritional counseling benefit provided by [facility] and encouraged patient to see dietician to assist with dietary management of diabetes and obesity, with patient in agreement a referral for nutritional counseling with a registered dietician faxed to the [facility] , issued a True Metrix glucometer and starter kit and will ask primary care provider to fax Rx for testing supplies to the OP pharmacy,reviewed patient's meter CBG log, discussed blood glucose monitoring and interpretation, discussed recommended target ranges for pre-meal and post-meal, provided blood sugar log sheets with targets for pre and post meal,  discussed free mental health benefits offered through [facility] and provided brochure and phone number of [phone], discussed difficulty with smoking cessation and free assist offered to help her remain smoke free, will ask her primary care provider for Rx for nicotine patch and encouraged her to sigh up for smoking cessation classes, discussed results of today's POC CBG and encouraged her to test post meal as well as premeal, reviewed numerous upcoming appointments with neurologist, ultrasound of right wrist, vascular surgeon, cardiac rehab, arranged for Link To Wellness follow up in August      RNCM to fax today's office visit note to Dorothea Ogle PA-C and request prescriptions for DM testing supplies and nicotine patch be faxed to [facility] with delivery to [place]. Will fax referral for nutritional counseling with RD in [place]. RNCM will meet quarterly and as needed with patient per Link To Wellness program guidelines to assist with Type II DM, HTN, hyperlipidemia, CAD and obesity self-management and assess patient's progress toward mutually set goals. Barrington Ellison RN,CCM,CDE [place] Management Coordinator Link To Wellness Office Phone [phone] Office Fax [phone]

## 2015-07-27 ENCOUNTER — Ambulatory Visit (INDEPENDENT_AMBULATORY_CARE_PROVIDER_SITE_OTHER): Payer: 59 | Admitting: Neurology

## 2015-07-27 ENCOUNTER — Encounter: Payer: Self-pay | Admitting: Neurology

## 2015-07-27 VITALS — BP 114/82 | HR 96 | Ht 69.0 in | Wt 237.0 lb

## 2015-07-27 DIAGNOSIS — S5411XS Injury of median nerve at forearm level, right arm, sequela: Secondary | ICD-10-CM | POA: Diagnosis not present

## 2015-07-27 MED ORDER — DULOXETINE HCL 30 MG PO CPEP: ORAL_CAPSULE | ORAL | Status: DC

## 2015-07-27 NOTE — Progress Notes (Signed)
Reason for visit: Right arm pain  Jennifer Cervantes is an 35 y.o. female  History of present illness:  Ms. Klepinger is a 35 year old right-handed black female with a history of some bleeding complications following coronary artery catheterization. The patient has had some evidence of median nerve injury, but this seemed to improve within a few weeks after the procedure. The patient follows up indicating that the improvement has stopped, she has had some worsening of pain, she has swelling in the arm and hand. There is discomfort with light touch on the hand and forearm. The patient has had problems dropping things. She denies any neck or shoulder discomfort. She returns for further evaluation.  Past Medical History  Diagnosis Date  . Type II diabetes mellitus ([place])     a. Type II  . CAD (coronary artery disease)     a. LHC on 06/21/15 which showed 75% occl mid-dist LCx, 40% occl mRCA, 99% mid-dist LAD s/p DES and 50% occl mLAD s/p DES (overlapping stents) and significant LV dysfunction with anteroapical HK; b. 06/2015 Myoview: EF 55-65%, no ischemia/infarct.  . Tobacco abuse   . Obesity   . Myocardial stunning ([facility])     a. EF 45% on 2D ECHO on 06/21/15 and severe LV dysfunction on LV gram by cath. Repeat 2D ECHO with EF 55-60% and mild HK of the apicoseptal myocardium.  . Tricuspid regurgitation   . Neuropraxia of right median nerve     a. suspected after right radial cath. will follow with neuro outpatient if does not resolve.   . Hypertension   . Hyperlipidemia   . Diabetes mellitus without complication ([place])   . NSTEMI (non-ST elevated myocardial infarction) [facility]) 05/2015    [facility]  . 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 05/2015 Following PCI (radial access)-->conservatively managed.    Past Surgical History  Procedure Laterality Date  . Cardiac catheterization N/A 06/21/2015    Procedure: Left Heart Cath and Coronary Angiography;  Surgeon: Belva Crome, MD; pLAD  99%, CFX 75%, RCA 40%, EF 35%   . Cardiac catheterization N/A 06/21/2015    Procedure: Coronary Stent Intervention;  Surgeon: Belva Crome, MD;  Promus Premier DES, 3.0 x 20 and 3.5 x 32 mm stents to the LAD  . Cardiac catheterization N/A 06/21/2015    Procedure: Intravascular Ultrasound/IVUS;  Surgeon: Belva Crome, MD;  Location: [facility];  Service: Cardiovascular;  Laterality: N/A;  LAD  . Cardiac catheterization  06/22/2015    Procedure: Coronary/Graft Angiography;  Surgeon: Peter M Martinique, MD;  Location: [facility];  Service: Cardiovascular;;    Family History  Problem Relation Age of Onset  . Heart attack Neg Hx   . Heart failure Neg Hx   . Heart disease Neg Hx   . Stroke Neg Hx   . Diabetes Neg Hx   . Hyperlipidemia Neg Hx   . Hypertension Neg Hx     Social history:  reports that she quit smoking about 5 weeks ago. Her smoking use included Cigarettes. She has a 11.5 pack-year smoking history. She has never used smokeless tobacco. She reports that she does not drink alcohol or use illicit drugs.   No Known Allergies  Medications:  Prior to Admission medications   Medication Sig Start Date End Date Taking? Authorizing Provider  acetaminophen (TYLENOL) 500 MG tablet Take 500 mg by mouth every 6 (six) hours as needed for headache (pain).  Yes Historical Provider, MD  aspirin 81 MG EC tablet Take 1 tablet (81 mg total) by mouth daily. 07/17/15  Yes Camelia Eng Tysinger, PA-C  atorvastatin (LIPITOR) 80 MG tablet Take 1 tablet (80 mg total) by mouth daily at 6 PM. 07/12/15  Yes Camelia Eng Tysinger, PA-C  carvedilol (COREG) 3.125 MG tablet Take 1 tablet (3.125 mg total) by mouth 2 (two) times daily with a meal. 07/12/15  Yes Camelia Eng Tysinger, PA-C  Cholecalciferol (VITAMIN D3) 50000 units CAPS Take 1 capsule by mouth once a week. Patient taking differently: Take 1 capsule by mouth every Friday.  07/13/15  Yes Camelia Eng Tysinger, PA-C  gabapentin (NEURONTIN) 300 MG capsule Take 1  capsule (300 mg total) by mouth 2 (two) times daily. 07/04/15  Yes Kathrynn Ducking, MD  Insulin Glargine (LANTUS SOLOSTAR) 100 UNIT/ML Solostar Pen Inject 10 Units into the skin daily at 10 pm. 07/12/15  Yes Camelia Eng Tysinger, PA-C  Insulin Pen Needle (PEN NEEDLES) 32G X 4 MM MISC 1 each by Does not apply route daily. Use with Lantus pen 07/13/15  Yes Camelia Eng Tysinger, PA-C  isosorbide mononitrate (IMDUR) 30 MG 24 hr tablet Take 0.5 tablets (15 mg total) by mouth daily. 07/12/15  Yes Camelia Eng Tysinger, PA-C  lisinopril (PRINIVIL,ZESTRIL) 2.5 MG tablet Take 1 tablet (2.5 mg total) by mouth daily. 06/29/15  Yes Camelia Eng Tysinger, PA-C  metFORMIN (GLUCOPHAGE) 1000 MG tablet Take 1 tablet (1,000 mg total) by mouth 2 (two) times daily with a meal. 07/12/15  Yes Camelia Eng Tysinger, PA-C  nitroGLYCERIN (NITROSTAT) 0.4 MG SL tablet Place 1 tablet (0.4 mg total) under the tongue every 5 (five) minutes as needed for chest pain. 07/24/15  Yes Rogelia Mire, NP  omeprazole (PRILOSEC OTC) 20 MG tablet Take 1 tablet (20 mg total) by mouth daily. 07/24/15  Yes Rogelia Mire, NP  ticagrelor (BRILINTA) 90 MG TABS tablet Take 1 tablet (90 mg total) by mouth 2 (two) times daily. 07/12/15  Yes Camelia Eng Tysinger, PA-C    ROS:  Out of a complete 14 system review of symptoms, the patient complains only of the following symptoms, and all other reviewed systems are negative.  Excessive sweating Shortness of breath Joint pain Bruising easily Headache, numbness, weakness  Blood pressure 114/82, pulse 96, height 5\' 9"  (1.753 m), weight 237 lb (107.502 kg), last menstrual period 07/13/2015.  Physical Exam  General: The patient is alert and cooperative at the time of the examination. The patient is moderately obese.  Skin: No significant peripheral edema is noted.   Neurologic Exam  Mental status: The patient is alert and oriented x 3 at the time of the examination. The patient has apparent normal recent and remote  memory, with an apparently normal attention span and concentration ability.   Cranial nerves: Facial symmetry is present. Speech is normal, no aphasia or dysarthria is noted. Extraocular movements are full. Visual fields are full.  Motor: The patient has good strength in both upper extremities.  Sensory examination: Soft touch sensation is normal on the left hand and forearm, but the patient indicates some hypersensitivity with soft touch on the right hand and forearm.  Coordination: The patient has good finger-nose-finger and heel-to-shin bilaterally.  Gait and station: The patient has a normal gait.   Reflexes: Deep tendon reflexes are symmetric.   Assessment/Plan:  1. Right arm discomfort  The patient has developed some increased swelling, hypersensitivity to light touch with the right hand, she  may be developing a complex regional pain syndrome. Nerve conduction studies will be done on both arms, EMG will be done on the right arm. I will set the patient up for a pain center referral for nerve blocks for the complex regional pain syndrome. The patient will be given Cymbalta, she is already on gabapentin. She will return for the EMG. A note was given to continue keeping her out of work.  [name] MD 07/27/2015 2:31 PM  [facility] [address] [place], [place] [address]  Phone [phone] Fax [phone]

## 2015-07-30 DIAGNOSIS — Z0289 Encounter for other administrative examinations: Secondary | ICD-10-CM

## 2015-08-06 ENCOUNTER — Telehealth: Payer: Self-pay | Admitting: *Deleted

## 2015-08-06 ENCOUNTER — Encounter: Payer: 59 | Admitting: Physician Assistant

## 2015-08-06 NOTE — Telephone Encounter (Signed)
I was unable to reach pt. Pt has a FMLA form from [employer], pt need to sign a release and pay the fee for the form.

## 2015-08-07 DIAGNOSIS — N926 Irregular menstruation, unspecified: Secondary | ICD-10-CM | POA: Diagnosis not present

## 2015-08-15 ENCOUNTER — Telehealth: Payer: Self-pay | Admitting: Medical

## 2015-08-15 NOTE — Telephone Encounter (Signed)
Rn fax FMLA form to  [employer] to [phone] and confidential fax to [phone]. Form fax to both numbers and receive.

## 2015-08-15 NOTE — Telephone Encounter (Signed)
pls all out glucometer testing supplies, and call out Nicotine 21mg  patch daily, #30 and no refills.   Make sure she has f/u apt scheduled for July  This is in response to e-mail I got regarding her care.

## 2015-08-16 MED ORDER — NICOTINE 21 MG/24HR TD PT24: 21.0000 mg | MEDICATED_PATCH | Freq: Every day | TRANSDERMAL | Status: DC

## 2015-08-16 NOTE — Telephone Encounter (Signed)
I do not know pts active phone number number on file is diconnected so I can not call out testing supplies because I do not know which glucometer she has. Filled nicotene patches

## 2015-08-22 ENCOUNTER — Encounter (HOSPITAL_COMMUNITY): Payer: 59

## 2015-08-29 ENCOUNTER — Encounter: Payer: Self-pay | Admitting: Neurology

## 2015-08-29 ENCOUNTER — Ambulatory Visit (INDEPENDENT_AMBULATORY_CARE_PROVIDER_SITE_OTHER): Payer: Self-pay | Admitting: Neurology

## 2015-08-29 ENCOUNTER — Ambulatory Visit (INDEPENDENT_AMBULATORY_CARE_PROVIDER_SITE_OTHER): Payer: 59 | Admitting: Neurology

## 2015-08-29 DIAGNOSIS — S5411XS Injury of median nerve at forearm level, right arm, sequela: Secondary | ICD-10-CM

## 2015-08-29 DIAGNOSIS — Z0289 Encounter for other administrative examinations: Secondary | ICD-10-CM

## 2015-08-29 NOTE — Progress Notes (Signed)
Please refer to EMG and nerve conduction study procedure note. 

## 2015-08-29 NOTE — Procedures (Signed)
     HISTORY:  Jennifer Cervantes is a 35 year old patient with a history of bleeding complications following a right brachial artery catheterization for coronary artery disease. The patient has had some evidence of injury to the median nerve with ongoing discomfort and sensory changes. The patient is being evaluated for a possible median neuropathy on the right.  NERVE CONDUCTION STUDIES:  Nerve conduction studies were performed on both upper extremities. The distal motor latencies and motor amplitudes for the median and ulnar nerves were within normal limits. The F wave latencies and nerve conduction velocities for these nerves were also normal. The sensory latencies for the median and ulnar nerves were normal.   EMG STUDIES:  EMG study was performed on the right upper extremity:  The first dorsal interosseous muscle reveals 2 to 4 K units with full recruitment. No fibrillations or positive waves were noted. The abductor pollicis brevis muscle reveals 2 to 4 K units with full recruitment. No fibrillations or positive waves were noted. The extensor indicis proprius muscle reveals 1 to 3 K units with full recruitment. No fibrillations or positive waves were noted. The pronator teres muscle reveals 2 to 3 K units with full recruitment. No fibrillations or positive waves were noted. The biceps muscle reveals 1 to 2 K units with full recruitment. No fibrillations or positive waves were noted. The triceps muscle reveals 2 to 4 K units with full recruitment. No fibrillations or positive waves were noted. The anterior deltoid muscle reveals 2 to 3 K units with full recruitment. No fibrillations or positive waves were noted.   IMPRESSION:  Nerve conduction studies done on both upper extremities were within normal limits. No evidence of a right median neuropathy is seen. EMG evaluation of the right upper extremity is unremarkable without evidence of an overlying cervical radiculopathy.  [name] MD 08/29/2015 11:10 AM  [facility] [address] [place], [place] [address]  Phone [phone] Fax [phone]

## 2015-08-29 NOTE — Progress Notes (Signed)
Jennifer Cervantes comes back to the office today for EMG and nerve conduction study evaluation. She indicates that her symptoms of pain and numbness in the right arm have essentially completely cleared. The patient is undergone some physical therapy with good improvement.  EMG and nerve conduction study evaluation of the right arm is unremarkable, no evidence of a right median neuropathy is seen.  The patient was given a note for her to return back to work in full capacity. She will follow-up through this office if needed.

## 2015-09-21 ENCOUNTER — Encounter: Payer: Self-pay | Admitting: Vascular Surgery

## 2015-09-25 NOTE — Progress Notes (Signed)
Established Dialysis Access  History of Present Illness  Jennifer Cervantes is a 35 y.o. (05/17/1980) female who presents cc: right arm swelling.  Pt was seen in the hospital on 06/24/15 for R arterial occlusion related to PCI x 2.  The patient was on [place] for her DES, so no intervention could be completed.  The patient presents for re-evaluation of her right arm.  Pt c/o intermittent swelling in R arm.  Her venous duplex on RUE was completed in the hospital and was NEGATIVE for DVT or SVT  The patient's PMH, PSH, SH, and FamHx are unchanged from 06/24/15.  Current Outpatient Prescriptions  Medication Sig Dispense Refill  . acetaminophen (TYLENOL) 500 MG tablet Take 500 mg by mouth every 6 (six) hours as needed for headache (pain).    [name] aspirin 81 MG EC tablet Take 1 tablet (81 mg total) by mouth daily. 30 tablet   . atorvastatin (LIPITOR) 80 MG tablet Take 1 tablet (80 mg total) by mouth daily at 6 PM. 90 tablet 2  . carvedilol (COREG) 3.125 MG tablet Take 1 tablet (3.125 mg total) by mouth 2 (two) times daily with a meal. 180 tablet 1  . Cholecalciferol (VITAMIN D3) 50000 units CAPS Take 1 capsule by mouth once a week. (Patient taking differently: Take 1 capsule by mouth every Friday. ) 12 capsule 1  . DULoxetine (CYMBALTA) 30 MG capsule 1 tablet daily for week, then take 1 twice daily 60 capsule 3  . gabapentin (NEURONTIN) 300 MG capsule Take 1 capsule (300 mg total) by mouth 2 (two) times daily. 60 capsule 2  . Insulin Glargine (LANTUS SOLOSTAR) 100 UNIT/ML Solostar Pen Inject 10 Units into the skin daily at 10 pm. 5 pen 2  . Insulin Pen Needle (PEN NEEDLES) 32G X 4 MM MISC 1 each by Does not apply route daily. Use with Lantus pen 100 each 5  . isosorbide mononitrate (IMDUR) 30 MG 24 hr tablet Take 0.5 tablets (15 mg total) by mouth daily. 90 tablet 1  . lisinopril (PRINIVIL,ZESTRIL) 2.5 MG tablet Take 1 tablet (2.5 mg total) by mouth daily. 30 tablet 2  . metFORMIN (GLUCOPHAGE) 1000  MG tablet Take 1 tablet (1,000 mg total) by mouth 2 (two) times daily with a meal. 180 tablet 1  . nicotine (NICODERM CQ - DOSED IN MG/24 HOURS) 21 mg/24hr patch Place 1 patch (21 mg total) onto the skin daily. 28 patch 0  . nitroGLYCERIN (NITROSTAT) 0.4 MG SL tablet Place 1 tablet (0.4 mg total) under the tongue every 5 (five) minutes as needed for chest pain. 25 tablet 3  . omeprazole (PRILOSEC OTC) 20 MG tablet Take 1 tablet (20 mg total) by mouth daily.    . ticagrelor (BRILINTA) 90 MG TABS tablet Take 1 tablet (90 mg total) by mouth 2 (two) times daily. 180 tablet 6   No current facility-administered medications for this visit.     No Known Allergies  On ROS today: right arm intermittent swelling, no right arm motor sx   Physical Examination  Vitals:   09/28/15 1128  BP: 115/80  Pulse: 84  Resp: 18  Temp: 98 F (36.7 C)  TempSrc: Oral  SpO2: 100%  Weight: 232 lb (105.2 kg)  Height: 5\' 9"  (1.753 m)   Body mass index is 34.26 kg/m.  General: A&O x 3, WD, WN  Pulmonary: Sym exp, good air movt, CTAB, no rales, rhonchi, & wheezing  Cardiac: RRR, Nl S1, S2, no Murmurs, rubs  or gallops  Vascular: Vessel Right Left  Radial Palpable Palpable  Ulnar Palpable Palpable  Brachial Palpable Palpable   Gastrointestinal: soft, NTND, no G/R, bo HSM, no masses, no CVAT B  Musculoskeletal: M/S 5/5 throughout , Extremities without  ischemic changes   Neurologic: Pain and light touch intact in extremities , Motor exam as listed above  Non-Invasive Vascular Imaging  WBI (Date: 09/28/2015)  R:   WBI: 1.31,   radial: tri (attenuated),   ulnar: tri,   L:   WBI: 1.10,   radial: tri,   ulnar: tri,    Medical Decision Making  Jennifer Cervantes is a 35 y.o. female who presents with R radial artery occlusion due to PCI x 2   WBI demonstrate adequate perfusion of R arm so no intervention is needed.  Pt has a triphasic signal in R radial artery with attenuation of  waveform.  Unclear if there has been some recannulation vs this is recorded waveform is the retrograde flow from the palmar arch.  Regardless, again no intervention is needed.  Follow up as needed   Jennifer Barthel, MD, [facility] Office: [phone] Pager: [phone]

## 2015-09-28 ENCOUNTER — Ambulatory Visit (HOSPITAL_COMMUNITY)
Admission: RE | Admit: 2015-09-28 | Discharge: 2015-09-28 | Disposition: A | Discharge status: Home / Self Care | Payer: 59 | Source: Ambulatory Visit | Attending: Vascular Surgery | Admitting: Vascular Surgery

## 2015-09-28 ENCOUNTER — Ambulatory Visit (INDEPENDENT_AMBULATORY_CARE_PROVIDER_SITE_OTHER): Payer: 59 | Admitting: Vascular Surgery

## 2015-09-28 ENCOUNTER — Encounter: Payer: Self-pay | Admitting: Vascular Surgery

## 2015-09-28 VITALS — BP 115/80 | HR 84 | Temp 98.0°F | Resp 18 | Ht 69.0 in | Wt 232.0 lb

## 2015-09-28 DIAGNOSIS — I10 Essential (primary) hypertension: Secondary | ICD-10-CM | POA: Insufficient documentation

## 2015-09-28 DIAGNOSIS — M79641 Pain in right hand: Secondary | ICD-10-CM | POA: Insufficient documentation

## 2015-09-28 DIAGNOSIS — E119 Type 2 diabetes mellitus without complications: Secondary | ICD-10-CM | POA: Diagnosis not present

## 2015-09-28 DIAGNOSIS — I742 Embolism and thrombosis of arteries of the upper extremities: Secondary | ICD-10-CM | POA: Diagnosis not present

## 2015-09-28 DIAGNOSIS — Z720 Tobacco use: Secondary | ICD-10-CM | POA: Insufficient documentation

## 2015-09-28 DIAGNOSIS — I2510 Atherosclerotic heart disease of native coronary artery without angina pectoris: Secondary | ICD-10-CM | POA: Insufficient documentation

## 2015-09-28 DIAGNOSIS — E785 Hyperlipidemia, unspecified: Secondary | ICD-10-CM | POA: Insufficient documentation

## 2015-10-02 DIAGNOSIS — R946 Abnormal results of thyroid function studies: Secondary | ICD-10-CM | POA: Diagnosis not present

## 2015-10-02 DIAGNOSIS — R112 Nausea with vomiting, unspecified: Secondary | ICD-10-CM | POA: Diagnosis not present

## 2015-10-02 DIAGNOSIS — I252 Old myocardial infarction: Secondary | ICD-10-CM | POA: Diagnosis not present

## 2015-10-02 DIAGNOSIS — R918 Other nonspecific abnormal finding of lung field: Secondary | ICD-10-CM | POA: Diagnosis not present

## 2015-10-02 DIAGNOSIS — E7800 Pure hypercholesterolemia, unspecified: Secondary | ICD-10-CM | POA: Diagnosis not present

## 2015-10-02 DIAGNOSIS — M79651 Pain in right thigh: Secondary | ICD-10-CM | POA: Diagnosis not present

## 2015-10-02 DIAGNOSIS — R531 Weakness: Secondary | ICD-10-CM | POA: Diagnosis not present

## 2015-10-02 DIAGNOSIS — R0602 Shortness of breath: Secondary | ICD-10-CM | POA: Diagnosis not present

## 2015-10-02 DIAGNOSIS — E119 Type 2 diabetes mellitus without complications: Secondary | ICD-10-CM | POA: Diagnosis not present

## 2015-10-02 DIAGNOSIS — M79604 Pain in right leg: Secondary | ICD-10-CM | POA: Diagnosis not present

## 2015-10-05 ENCOUNTER — Encounter: Payer: Self-pay | Admitting: Medical

## 2015-10-05 ENCOUNTER — Ambulatory Visit (INDEPENDENT_AMBULATORY_CARE_PROVIDER_SITE_OTHER): Payer: 59 | Admitting: Medical

## 2015-10-05 VITALS — BP 122/80 | HR 101 | Wt 228.0 lb

## 2015-10-05 DIAGNOSIS — I252 Old myocardial infarction: Secondary | ICD-10-CM | POA: Diagnosis not present

## 2015-10-05 DIAGNOSIS — R0609 Other forms of dyspnea: Secondary | ICD-10-CM | POA: Diagnosis not present

## 2015-10-05 DIAGNOSIS — E669 Obesity, unspecified: Secondary | ICD-10-CM

## 2015-10-05 DIAGNOSIS — R790 Abnormal level of blood mineral: Secondary | ICD-10-CM

## 2015-10-05 DIAGNOSIS — R002 Palpitations: Secondary | ICD-10-CM | POA: Diagnosis not present

## 2015-10-05 DIAGNOSIS — R42 Dizziness and giddiness: Secondary | ICD-10-CM | POA: Diagnosis not present

## 2015-10-05 DIAGNOSIS — D539 Nutritional anemia, unspecified: Secondary | ICD-10-CM | POA: Diagnosis not present

## 2015-10-05 DIAGNOSIS — R0602 Shortness of breath: Secondary | ICD-10-CM

## 2015-10-05 DIAGNOSIS — E118 Type 2 diabetes mellitus with unspecified complications: Secondary | ICD-10-CM | POA: Diagnosis not present

## 2015-10-05 DIAGNOSIS — R0600 Dyspnea, unspecified: Secondary | ICD-10-CM

## 2015-10-05 DIAGNOSIS — R112 Nausea with vomiting, unspecified: Secondary | ICD-10-CM | POA: Diagnosis not present

## 2015-10-05 DIAGNOSIS — Z720 Tobacco use: Secondary | ICD-10-CM

## 2015-10-05 DIAGNOSIS — E785 Hyperlipidemia, unspecified: Secondary | ICD-10-CM

## 2015-10-05 DIAGNOSIS — I25118 Atherosclerotic heart disease of native coronary artery with other forms of angina pectoris: Secondary | ICD-10-CM

## 2015-10-05 DIAGNOSIS — R946 Abnormal results of thyroid function studies: Secondary | ICD-10-CM

## 2015-10-05 LAB — FERRITIN: Ferritin: 12 ng/mL (ref 10–154)

## 2015-10-05 LAB — VITAMIN B12: VITAMIN B 12: 275 pg/mL (ref 200–1100)

## 2015-10-05 LAB — IRON AND TIBC
%SAT: 11 % (ref 11–50)
IRON: 48 ug/dL (ref 40–190)
TIBC: 426 ug/dL (ref 250–450)
UIBC: 378 ug/dL (ref 125–400)

## 2015-10-05 LAB — FOLATE: Folate: 6.6 ng/mL (ref >5.4)

## 2015-10-05 LAB — HEMOGLOBIN A1C
HEMOGLOBIN A1C: 5.5 % (ref <5.7)
MEAN PLASMA GLUCOSE: 111 mg/dL

## 2015-10-05 MED ORDER — BUDESONIDE-FORMOTEROL FUMARATE 160-4.5 MCG/ACT IN AERO: 2.0000 | INHALATION_SPRAY | Freq: Two times a day (BID) | RESPIRATORY_TRACT | 1 refills | Status: DC

## 2015-10-05 MED ORDER — ALBUTEROL SULFATE (2.5 MG/3ML) 0.083% IN NEBU
2.5000 mg | INHALATION_SOLUTION | Freq: Once | RESPIRATORY_TRACT | Status: AC
  Administered 2015-10-05: 2.5 mg via RESPIRATORY_TRACT

## 2015-10-05 MED ORDER — ALBUTEROL SULFATE HFA 108 (90 BASE) MCG/ACT IN AERS: 2.0000 | INHALATION_SPRAY | Freq: Four times a day (QID) | RESPIRATORY_TRACT | 0 refills | Status: DC | PRN

## 2015-10-05 MED ORDER — NICOTINE 21 MG/24HR TD PT24: 21.0000 mg | MEDICATED_PATCH | Freq: Every day | TRANSDERMAL | 0 refills | Status: DC

## 2015-10-05 NOTE — Progress Notes (Signed)
Subjective: Chief Complaint  Patient presents with  . Follow-up    hospital f/up and was having rt arm pain and rt thigh pain. they said all was ok and was seen at [facility].   Here for hospital f/u and not feeling good.  This is her second visit with me.   She has hx/o diabetes, new MI and CAD diagnoses this year in April, hypertension, hyperlipidemia, obesity, smoker. She is here today for feeling faint, heart rate is high, been vomiting some last few days.   Has had all these symptoms the last few days.  She went to [facility] 4 days ago for same symptoms.   They did EKG, heart studies, but said heart was find.  Had blood work which she has with her.    She denies diarrhea, abdominal pain, no URI symptoms, no fever, no back pain, no paresthesias.   No sweats.  She notes ongoing SOB, DOE, tightness in chest.  She has been doing cardiac rehab since MI, but only going once a month as she gets too SOB and can't complete rehab.   She has f/u appt with cardiology planned.  She has smoked about 1ppd x 23 years.  She denies hx/o asthma, COPD, no prior inhaler use, no prior eval for asthma.  Denies bleeding disorder, sickle cell or trait.   No prior anemia, but says periods are heavier on the blood thinner 05/2015.   Denies personal or  family hx/o thyroid disease.  diabetes - doing much better, taking metformin, Lantus 10 QHS and glucose running 90-120s.   Past Medical History:  Diagnosis Date  . CAD (coronary artery disease)    a. LHC on 06/21/15 which showed 75% occl mid-dist LCx, 40% occl mRCA, 99% mid-dist LAD s/p DES and 50% occl mLAD s/p DES (overlapping stents) and significant LV dysfunction with anteroapical HK; b. 06/2015 Myoview: EF 55-65%, no ischemia/infarct.  . Diabetes mellitus without complication ([place])   . Hyperlipidemia   . Hypertension   . Myocardial stunning (HCC)    a. EF 45% on 2D ECHO on 06/21/15 and severe LV dysfunction on LV gram by cath. Repeat 2D ECHO with EF  55-60% and mild HK of the apicoseptal myocardium.  [name] Neuropraxia of right median nerve    a. suspected after right radial cath. will follow with neuro outpatient if does not resolve.   . NSTEMI (non-ST elevated myocardial infarction) [facility]) 05/2015   [facility]  . Obesity   . 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 05/2015 Following PCI (radial access)-->conservatively managed.  . Tobacco abuse   . Tricuspid regurgitation   . Type II diabetes mellitus ([place])    a. Type II   Past Surgical History:  Procedure Laterality Date  . CARDIAC CATHETERIZATION N/A 06/21/2015   Procedure: Left Heart Cath and Coronary Angiography;  Surgeon: Belva Crome, MD; pLAD 99%, CFX 75%, RCA 40%, EF 35%   . CARDIAC CATHETERIZATION N/A 06/21/2015   Procedure: Coronary Stent Intervention;  Surgeon: Belva Crome, MD;  Promus Premier DES, 3.0 x 20 and 3.5 x 32 mm stents to the LAD  . CARDIAC CATHETERIZATION N/A 06/21/2015   Procedure: Intravascular Ultrasound/IVUS;  Surgeon: Belva Crome, MD;  Location: [place] [facility];  Service: Cardiovascular;  Laterality: N/A;  LAD  . CARDIAC CATHETERIZATION  06/22/2015   Procedure: Coronary/Graft Angiography;  Surgeon: Peter M Martinique, MD;  Location: [facility];  Service: Cardiovascular;;   Current Outpatient Prescriptions  on File Prior to Visit  Medication Sig Dispense Refill  . acetaminophen (TYLENOL) 500 MG tablet Take 500 mg by mouth every 6 (six) hours as needed for headache (pain).    [name] aspirin 81 MG EC tablet Take 1 tablet (81 mg total) by mouth daily. 30 tablet   . atorvastatin (LIPITOR) 80 MG tablet Take 1 tablet (80 mg total) by mouth daily at 6 PM. 90 tablet 2  . carvedilol (COREG) 3.125 MG tablet Take 1 tablet (3.125 mg total) by mouth 2 (two) times daily with a meal. 180 tablet 1  . Cholecalciferol (VITAMIN D3) 50000 units CAPS Take 1 capsule by mouth once a week. (Patient taking differently: Take 1 capsule by mouth every Friday. ) 12 capsule 1  .  gabapentin (NEURONTIN) 300 MG capsule Take 1 capsule (300 mg total) by mouth 2 (two) times daily. 60 capsule 2  . Insulin Glargine (LANTUS SOLOSTAR) 100 UNIT/ML Solostar Pen Inject 10 Units into the skin daily at 10 pm. 5 pen 2  . Insulin Pen Needle (PEN NEEDLES) 32G X 4 MM MISC 1 each by Does not apply route daily. Use with Lantus pen 100 each 5  . isosorbide mononitrate (IMDUR) 30 MG 24 hr tablet Take 0.5 tablets (15 mg total) by mouth daily. 90 tablet 1  . lisinopril (PRINIVIL,ZESTRIL) 2.5 MG tablet Take 1 tablet (2.5 mg total) by mouth daily. 30 tablet 2  . metFORMIN (GLUCOPHAGE) 1000 MG tablet Take 1 tablet (1,000 mg total) by mouth 2 (two) times daily with a meal. 180 tablet 1  . ticagrelor (BRILINTA) 90 MG TABS tablet Take 1 tablet (90 mg total) by mouth 2 (two) times daily. 180 tablet 6  . nicotine (NICODERM CQ - DOSED IN MG/24 HOURS) 21 mg/24hr patch Place 1 patch (21 mg total) onto the skin daily. (Patient not taking: Reported on 09/28/2015) 28 patch 0  . nitroGLYCERIN (NITROSTAT) 0.4 MG SL tablet Place 1 tablet (0.4 mg total) under the tongue every 5 (five) minutes as needed for chest pain. (Patient not taking: Reported on 10/05/2015) 25 tablet 3   No current facility-administered medications on file prior to visit.      ROS as in subjective   Objective: BP 122/80   Pulse (!) 101   Wt 228 lb (103.4 kg)   LMP 09/28/2015   BMI 33.67 kg/m   General appearance: alert, no distress, WD/WN, obese AA female HEENT: normocephalic, sclerae anicteric, TMs pearly, nares patent, no discharge or erythema, pharynx normal Oral cavity: MMM, no lesions Neck: supple, no lymphadenopathy, no thyromegaly, no masses, no JVD, no bruits Heart: RRR, normal S1, S2, no murmurs Lungs: decreased breath sounds, othewise bilaterally, no wheezes, rhonchi, or rales Abdomen: +bs, soft, non tender, non distended, no masses, no hepatomegaly, no splenomegaly, no bruits No edema Pedal pulses 1+ Right radial pulse  absent, but otherwise 1+ UE pulses Neuro: nonfocal exam  PFTs reviewed Labs from [facility] from this week's ED visit reviewed.   Assessment: Encounter Diagnoses  Name Primary?  Sunday [place] headed Yes  . Coronary artery disease involving native coronary artery of native heart with other form of angina pectoris ([place])   . History of MI (myocardial infarction)   . Abnormal thyroid function test   . Type 2 diabetes mellitus with complication, without long-term current use of insulin ([place])   . Hyperlipidemia   . Obesity   . Tobacco abuse   . Nausea and vomiting, intractability of vomiting not specified, unspecified vomiting type   .  Palpitation   . Deficiency anemia   . Low magnesium levels   . SOB (shortness of breath)   . DOE (dyspnea on exertion)      Plan: Reviewed recent labs from [place].   Labs show abnormal TSH, slightly low Magnesium, anemia, slightly worse than 06/2015 labs.   We will request EKG and CXR records from [place].    Labs today to check for iron, B12, Folate, and recheck on HgbA1C.  SOB, wheezing:  PFT done today, abnormal suggestive of asthma vs COPD.   1 round of albuterol nebulized done in office.  Begin samples of Symbicort, albuterol prn.  discussed proper use of medication, difference between rescue and preventative medications.   Call back next week with symptom update.    Anemia - recent hgb 10.9 at [facility].   Platelets normal, normocytic anemia.  Has heavy periods since being on blood thinner since April 2017.   No hx/o fibroids.   No blood in stool.   Not taking iron.   Pending labs, will likely begin iron.  Abnormal thyroid - no prior thyroid medication prior, no family hx/o thyroid disease.  Recent labs from [facility] show TSH low, but T4 and T3 were normal. Will defer this for now.  Magnesium marginally low.  Consider Mg supplement.  Diabetes - c/t Lantus 10 QHS,  Metformin.   Advised the need to stop soda.    C/t rest of  medications as usual for BP, heart, lipids.    Strongly advised she stop tobacco!  She will begin trial of nicotine patches  Advised if worse over the weekend, particularly chest pain, DOE, syncope, then call 911.   F/u pending labs.   Ceriyah was seen today for follow-up.  Diagnoses and all orders for this visit:  Light headed -     Spirometry with Graph  Coronary artery disease involving native coronary artery of native heart with other form of angina pectoris ([place])  History of MI (myocardial infarction)  Abnormal thyroid function test  Type 2 diabetes mellitus with complication, without long-term current use of insulin (HCC) -     Hemoglobin A1c  Hyperlipidemia  Obesity  Tobacco abuse  Nausea and vomiting, intractability of vomiting not specified, unspecified vomiting type  Palpitation  Deficiency anemia -     Iron and TIBC -     Ferritin -     Folate -     Vitamin B12  Low magnesium levels  SOB (shortness of breath) -     albuterol (PROVENTIL) (2.5 MG/3ML) 0.083% nebulizer solution 2.5 mg; Take 3 mLs (2.5 mg total) by nebulization once.  DOE (dyspnea on exertion) -     albuterol (PROVENTIL) (2.5 MG/3ML) 0.083% nebulizer solution 2.5 mg; Take 3 mLs (2.5 mg total) by nebulization once.  Other orders -     budesonide-formoterol (SYMBICORT) 160-4.5 MCG/ACT inhaler; Inhale 2 puffs into the lungs 2 (two) times daily. -     albuterol (PROVENTIL HFA;VENTOLIN HFA) 108 (90 Base) MCG/ACT inhaler; Inhale 2 puffs into the lungs every 6 (six) hours as needed for wheezing or shortness of breath. -     nicotine (NICODERM CQ - DOSED IN MG/24 HOURS) 21 mg/24hr patch; Place 1 patch (21 mg total) onto the skin daily.   Spent > 30 minutes face to face with patient in discussion of symptoms, evaluation, plan and recommendations.

## 2015-10-07 ENCOUNTER — Other Ambulatory Visit: Payer: Self-pay | Admitting: Medical

## 2015-10-07 MED ORDER — LISINOPRIL 2.5 MG PO TABS: 2.5000 mg | ORAL_TABLET | Freq: Every day | ORAL | 2 refills | Status: DC

## 2015-10-07 MED ORDER — MAGNESIUM GLUCONATE 30 MG PO TABS: 30.0000 mg | ORAL_TABLET | Freq: Every day | ORAL | 2 refills | Status: DC

## 2015-10-07 MED ORDER — INSULIN GLARGINE 100 UNIT/ML SOLOSTAR PEN: 10.0000 [IU] | PEN_INJECTOR | Freq: Every day | SUBCUTANEOUS | 2 refills | Status: DC

## 2015-10-07 MED ORDER — FERROUS GLUCONATE 324 (38 FE) MG PO TABS: 324.0000 mg | ORAL_TABLET | Freq: Every day | ORAL | 1 refills | Status: DC

## 2015-10-07 MED ORDER — PEN NEEDLES 32G X 4 MM MISC: 1.0000 | Freq: Every day | 5 refills | Status: DC

## 2015-10-08 ENCOUNTER — Other Ambulatory Visit: Payer: Self-pay | Admitting: *Deleted

## 2015-10-08 ENCOUNTER — Encounter: Payer: Self-pay | Admitting: *Deleted

## 2015-10-08 NOTE — Patient Outreach (Signed)
[facility] Care Management   10/08/2015  Jennifer Cervantes [birth year] [id number]  Jennifer Cervantes is an 35 y.o. female who presents to the [place] Management office for routine Link To Wellness follow up for self management assistance with Type II DM, HTN and hyperlipidemia, obesity and tobacco abuse.   Subjective:  Jaki says she returned to work on 7/6 and is working an average of 72 hours per week to pay for her bills and medications. She reports unrelenting level 10 pain and numbness in her right arm from the elbow to her hand. She says the neurologist and the vascular surgeon say the tests done showed no nerve injury to the right radials nerve and there is no need for surgical intervention. She says the neurologist referred her to a pain specialist but she cannot afford to take time off from work without pay. She is also complaining of shortness of breath and intermittent chest pain that resloves with rest and 2 nitroglycerin. She had pulmonary function tests on 8/11 and said her primary care provider told her she has the lungs of an 38 year old and that she absolutely has to stop smoking. She reports she checks her blood sugar 3 times daily, before each meal and the variance has been 60-110. She stated the correct treatment for hypoglycemia.  She attributes her improved blood sugar control to taking her medications consistently and consistent following a low CHO meal plan.  She says she now has a credit card and is asking for assistance in securing her program pharmacy benefits at the outpatient pharmacy to help with the cost of her medications and DM testing supplies.   Objective:    Review of Systems  Constitutional: Negative.     Physical Exam  Constitutional: She is oriented to person, place, and time. She appears well-developed and well-nourished.  Cardiovascular: Normal rate and regular rhythm.   Neurological: She is alert  and oriented to person, place, and time.  Skin: Skin is warm and dry.  Psychiatric: She has a normal mood and affect. Her behavior is normal. Judgment and thought content normal.   Filed Weights   10/08/15 0809  Weight: 228 lb (103.4 kg)   Vitals:   10/08/15 0809  BP: 112/82  Room air O2 saturation= 100% Pulse= 70 and regular  Encounter Medications:   Outpatient Encounter Prescriptions as of 10/08/2015  Medication Sig Note  . acetaminophen (TYLENOL) 500 MG tablet Take 500 mg by mouth every 6 (six) hours as needed for headache (pain).   [name] albuterol (PROVENTIL HFA;VENTOLIN HFA) 108 (90 Base) MCG/ACT inhaler Inhale 2 puffs into the lungs every 6 (six) hours as needed for wheezing or shortness of breath. 10/08/2015: 4 times daily  . aspirin 81 MG EC tablet Take 1 tablet (81 mg total) by mouth daily.   [name] atorvastatin (LIPITOR) 80 MG tablet Take 1 tablet (80 mg total) by mouth daily at 6 PM.   . budesonide-formoterol (SYMBICORT) 160-4.5 MCG/ACT inhaler Inhale 2 puffs into the lungs 2 (two) times daily.   . carvedilol (COREG) 3.125 MG tablet Take 1 tablet (3.125 mg total) by mouth 2 (two) times daily with a meal.   . Cholecalciferol (VITAMIN D3) 50000 units CAPS Take 1 capsule by mouth once a week. (Patient taking differently: Take 1 capsule by mouth every Friday. )   . gabapentin (NEURONTIN) 300 MG capsule Take 1 capsule (300 mg total) by mouth 2 (two) times daily.   [name]  Insulin Glargine (LANTUS SOLOSTAR) 100 UNIT/ML Solostar Pen Inject 10 Units into the skin daily at 10 pm. 10/08/2015: At 6 pm  . isosorbide mononitrate (IMDUR) 30 MG 24 hr tablet Take 0.5 tablets (15 mg total) by mouth daily.   [name] lisinopril (PRINIVIL,ZESTRIL) 2.5 MG tablet Take 1 tablet (2.5 mg total) by mouth daily.   . metFORMIN (GLUCOPHAGE) 1000 MG tablet Take 1 tablet (1,000 mg total) by mouth 2 (two) times daily with a meal.   . nitroGLYCERIN (NITROSTAT) 0.4 MG SL tablet Place 1 tablet (0.4 mg total) under the tongue every 5  (five) minutes as needed for chest pain.   . ticagrelor (BRILINTA) 90 MG TABS tablet Take 1 tablet (90 mg total) by mouth 2 (two) times daily.   . ferrous gluconate (FERGON) 324 MG tablet Take 1 tablet (324 mg total) by mouth daily with breakfast. (Patient not taking: Reported on 10/08/2015)   . Insulin Pen Needle (PEN NEEDLES) 32G X 4 MM MISC 1 each by Does not apply route daily. Use with Lantus pen   . magnesium gluconate (MAGONATE) 30 MG tablet Take 1 tablet (30 mg total) by mouth daily. (Patient not taking: Reported on 10/08/2015)   . nicotine (NICODERM CQ - DOSED IN MG/24 HOURS) 21 mg/24hr patch Place 1 patch (21 mg total) onto the skin daily. (Patient not taking: Reported on 09/28/2015)   . nicotine (NICODERM CQ - DOSED IN MG/24 HOURS) 21 mg/24hr patch Place 1 patch (21 mg total) onto the skin daily. (Patient not taking: Reported on 10/08/2015)    No facility-administered encounter medications on file as of 10/08/2015.     Functional Status:   In your present state of health, do you have any difficulty performing the following activities: 07/25/2015 07/25/2015  Hearing? N N  Vision? N N  Difficulty concentrating or making decisions? N N  Walking or climbing stairs? N N  Dressing or bathing? N N  Doing errands, shopping? N N  Preparing Food and eating ? N N  Using the Toilet? N N  In the past six months, have you accidently leaked urine? N N  Do you have problems with loss of bowel control? N N  Managing your Medications? N N  Managing your Finances? N N  Housekeeping or managing your Housekeeping? N N  Some recent data might be hidden    Fall/Depression Screening:    PHQ 2/9 Scores 10/08/2015 07/25/2015  PHQ - 2 Score 3 5  PHQ- 9 Score 8 15    Assessment:   [employer] employee and Link To Wellness member with Type II diabetes, HTN, Hyperlipidemia, obesity and new diagnosis of lung disease, asthma vs COPD.  Plan:   [facility] CM Care Plan Problem One   Flowsheet Row Most Recent Value   Care Plan Problem One Link To Wellness member with  obesity , CAD s/p MI with stent placement in LAD, Type II DM with most recent Hgb A1C= 9.1% on 06/22/15, but with good control by self monitored CBGs (awaiting results of Hgb A1C  drawn on 8/11), HTN with BP in good control at present, hyperlipidemia with low HDL = 30 per lipid panel on 4/28, tobacco abuse and smoking again ( 1/2 pack per day), new diagnosis of COPD vs asthma  Role Documenting the Problem One  Care Management Coordinator  Care Plan for Problem One  Active  THN Long Term Goal (31-90 days) Pt will once again start smoking cessation strategies, she will demonstrate ongoing improved glycemic  control as evidenced by CBGs meeting target >80% of the time without increased incidents of hypoglycemia and meeting Hgb A1C target at next assessment, ongoing good control of HTN as evidenced by BP readings < 140/<90, improved lipid panel at next assessment, patient will verbalize understanding of COPD action plan and not require visit to ER or inpatient admittance,  and ongoing weight loss or no weight gain at next assessment    THN Long Term Goal Start Date  10/08/15  Interventions for Problem One Long Term Goal Asked patient to provide personal health update since last visit, discussed new diagnosis of COPD vs asthma and current treatment,  provided her COPD handout including action plan,   assessed ongoing pain/numbness in right arm and encouraged her to make appointment with the pain doctor she was referred to when she afford to be away from work,  reviewed patient's medications and assessed medication adherence, discussed patient's reported CBG readings, discussed recommended target ranges for pre-meal and post-meal, discussed the role of stress on blood sugar and reviewed strategies to manage stress, referred her to [facility] OP  Pharmacist to arrange transfer of prescriptions to [facility] OP pharmacy with reduced of no copays on most of her medications. Will  discuss re-referral to RD for nutritional counseling at next visit. Will arrange Link To Wellness follow up based on results of 8/11 Hgb A1C.      RNCM to fax today's office visit note to Chana Bode PA-C. RNCM will meet quarterly and as needed with patient per Link To Wellness program guidelines to assist with Type II DM, HTN, hyperlipidemia, COPD/asthma and obesity self-management and assess patient's progress toward mutually set goals.      Barrington Ellison RN,CCM,CDE [place] Management Coordinator Link To Wellness Office Phone [phone] Office Fax [phone]

## 2015-10-09 ENCOUNTER — Encounter (HOSPITAL_COMMUNITY): Payer: 59

## 2015-10-09 ENCOUNTER — Telehealth: Payer: Self-pay | Admitting: Medical

## 2015-10-09 NOTE — Telephone Encounter (Signed)
No VM  

## 2015-10-09 NOTE — Telephone Encounter (Signed)
Call and see if any improvement with the inhalers so far?  If she doesn't have cardiology f/u this week, please try and get her in this week given her SOB and difficulty breathing with exercise symptoms.  She has had heart attack this year, and needs to see cardiology preferably this week!

## 2015-10-10 NOTE — Telephone Encounter (Signed)
appt Friday with cardiology?  I assume you mean appt with cardiology this Friday.  If not, it needs to be.  She can't wait a week to be seen if still having signifcant SOB symptoms.

## 2015-10-10 NOTE — Telephone Encounter (Signed)
Pt has appt Friday, starts cardiac rehab Monday and sees cardio NEXT Friday.

## 2015-10-11 ENCOUNTER — Other Ambulatory Visit: Payer: Self-pay | Admitting: *Deleted

## 2015-10-11 ENCOUNTER — Telehealth: Payer: Self-pay | Admitting: Interventional Cardiology

## 2015-10-11 NOTE — Telephone Encounter (Signed)
If she is NO better, she needs to be worked in to cardiology not me.   If she is improving with inhaler, then find to f/u here.   If not,get her worked into cardiology tomorrow given SOB, dyspnea on exertion

## 2015-10-11 NOTE — Telephone Encounter (Signed)
Keep the appt for tomorrow here tentatively, but lets see if cardiology can get her in

## 2015-10-11 NOTE — Telephone Encounter (Signed)
New message       Pt was having SOB and was prescribed an inhaler per Loma Sousa at the provider's office. Provider is seeking an earlier appt for the pt than previously scheduled. Provider was asking for an appt this week.  Please call pt to schedule an earlier time.    Pt c/o Shortness Of Breath: STAT if SOB developed within the last 24 hours or pt is noticeably SOB on the phone  1. Are you currently SOB (can you hear that pt is SOB on the phone)? Pt was not on the phone  2. How long have you been experiencing SOB? Not sure pt was not on the phone  3. Are you SOB when sitting or when up moving around? Not sure pt was not on the phone  4. Are you currently experiencing any other symptoms? Not sure pt was not on the phone

## 2015-10-11 NOTE — Telephone Encounter (Signed)
They have no openings but will send a message to the nurse and see if she can be worked in Architectural technologist. Does appt with [place] for tomorrow need to be canceled?

## 2015-10-11 NOTE — Telephone Encounter (Signed)
Sees you Friday and cardiac rehab Monday, and cardiology NEXT friday

## 2015-10-11 NOTE — Patient Outreach (Signed)
Attempted to reach [facility] via her mobile number to provide her with remote smoking cessation information. Unable to leave voice mail, so also sent information in a secure email to her [facility] e-mail address. Barrington Ellison RN,CCM,CDE [place] Management Coordinator Link To Wellness Office Phone [phone] Office Fax [phone]

## 2015-10-12 ENCOUNTER — Encounter: Payer: Self-pay | Admitting: Medical

## 2015-10-12 ENCOUNTER — Ambulatory Visit (INDEPENDENT_AMBULATORY_CARE_PROVIDER_SITE_OTHER): Payer: 59 | Admitting: Medical

## 2015-10-12 ENCOUNTER — Other Ambulatory Visit: Payer: Self-pay | Admitting: Medical

## 2015-10-12 ENCOUNTER — Other Ambulatory Visit: Payer: Self-pay

## 2015-10-12 VITALS — BP 110/68 | HR 98 | Wt 233.0 lb

## 2015-10-12 DIAGNOSIS — R946 Abnormal results of thyroid function studies: Secondary | ICD-10-CM | POA: Diagnosis not present

## 2015-10-12 DIAGNOSIS — I252 Old myocardial infarction: Secondary | ICD-10-CM

## 2015-10-12 DIAGNOSIS — E669 Obesity, unspecified: Secondary | ICD-10-CM

## 2015-10-12 DIAGNOSIS — I742 Embolism and thrombosis of arteries of the upper extremities: Secondary | ICD-10-CM | POA: Diagnosis not present

## 2015-10-12 DIAGNOSIS — I25118 Atherosclerotic heart disease of native coronary artery with other forms of angina pectoris: Secondary | ICD-10-CM

## 2015-10-12 DIAGNOSIS — D539 Nutritional anemia, unspecified: Secondary | ICD-10-CM

## 2015-10-12 DIAGNOSIS — R0609 Other forms of dyspnea: Secondary | ICD-10-CM | POA: Diagnosis not present

## 2015-10-12 DIAGNOSIS — R0602 Shortness of breath: Secondary | ICD-10-CM

## 2015-10-12 DIAGNOSIS — R790 Abnormal level of blood mineral: Secondary | ICD-10-CM

## 2015-10-12 DIAGNOSIS — E118 Type 2 diabetes mellitus with unspecified complications: Secondary | ICD-10-CM | POA: Diagnosis not present

## 2015-10-12 DIAGNOSIS — Z720 Tobacco use: Secondary | ICD-10-CM | POA: Diagnosis not present

## 2015-10-12 DIAGNOSIS — R0600 Dyspnea, unspecified: Secondary | ICD-10-CM

## 2015-10-12 LAB — D-DIMER, QUANTITATIVE: D-Dimer, Quant: 0.45 mcg/mL FEU (ref <0.50)

## 2015-10-12 MED ORDER — MAGNESIUM GLUCONATE 30 MG PO TABS
30.0000 mg | ORAL_TABLET | Freq: Every day | ORAL | 2 refills | Status: DC
Start: 2015-10-12 — End: 2016-04-03

## 2015-10-12 MED ORDER — FUROSEMIDE 20 MG PO TABS: 20.0000 mg | ORAL_TABLET | Freq: Every day | ORAL | 1 refills | Status: DC

## 2015-10-12 MED ORDER — CLOPIDOGREL BISULFATE 75 MG PO TABS: 75.0000 mg | ORAL_TABLET | Freq: Every day | ORAL | 11 refills | Status: DC

## 2015-10-12 MED FILL — CLOPIDOGREL 75 MG TABLET: 75 | 30 days supply | Qty: 30 | Fill #0

## 2015-10-12 MED FILL — FERROUS GLUCONATE 324 MG TA: 324 (38 FE) | 90 days supply | Qty: 90 | Fill #0

## 2015-10-12 MED FILL — FUROSEMIDE 20 MG TABLET: 20 | 30 days supply | Qty: 30 | Fill #0

## 2015-10-12 NOTE — Progress Notes (Signed)
Subjective: Chief Complaint  Patient presents with  . Follow-up    was not able to see cardio they wanted her there today at 10 and she did not have the time to make that appt. SOB, light chest pain and no other symptoms   Here for 1 week f/u.  I saw her last week 10/05/15 for shortness of breath, DOE, lightheaded, nausea.  Last visit we checked labs, did PFT showing abnormality.  Since last visit she has been using samples of Symbicort 2 puffs BID, albuterol inhaler few times daily, and neither seem to be making any improvement in her symptoms. She is no worse or better than last visit.   She has begun the magnesium.   compliant with rest of medications.     Sugars are running good, low 100s, but no hypoglycemia.  Taking Lantus 10 u QHS.  From last visit she had come in for SOB, DOE, after seeing [facility] on on 10/02/15 for same.  At [facility] she had labs, EKG, CXR.  No MI was seen and they advised she go back to cardiology for f/u.   She has appt scheduled with cardiology in 10 days.  From last visit she notes having heart attack and stents placed by Dr. Daneen Schick back in 06/2015.  She was suppose to be going to cardiac rehab, but apparently she was getting so much SOB at rehab that she only went a time or 2, hasn't been back.    She denies diarrhea, abdominal pain, no URI symptoms, no fever, no back pain, no paresthesias.   No sweats.  She notes ongoing SOB, DOE, tightness in chest.    She still smokes.  She has smoked about 1ppd x 23 years.   Denies bleeding disorder, sickle cell or trait.   No prior anemia, but says periods are heavier on the blood thinner 05/2015.  No recent travel, no recent bed ridden status, no calve pain or swelling, no recent trauma, no procedure since stents in May.   Denies personal or  family hx/o thyroid disease.   Past Medical History:  Diagnosis Date  . CAD (coronary artery disease)    a. LHC on 06/21/15 which showed 75% occl mid-dist LCx, 40% occl mRCA,  99% mid-dist LAD s/p DES and 50% occl mLAD s/p DES (overlapping stents) and significant LV dysfunction with anteroapical HK; b. 06/2015 Myoview: EF 55-65%, no ischemia/infarct.  . Diabetes mellitus without complication ([place])   . Hyperlipidemia   . Hypertension   . Myocardial stunning (HCC)    a. EF 45% on 2D ECHO on 06/21/15 and severe LV dysfunction on LV gram by cath. Repeat 2D ECHO with EF 55-60% and mild HK of the apicoseptal myocardium.  [name] Neuropraxia of right median nerve    a. suspected after right radial cath. will follow with neuro outpatient if does not resolve.   . NSTEMI (non-ST elevated myocardial infarction) [facility]) 05/2015   [facility]  . Obesity   . 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 05/2015 Following PCI (radial access)-->conservatively managed.  . Tobacco abuse   . Tricuspid regurgitation   . Type II diabetes mellitus ([place])    a. Type II   Past Surgical History:  Procedure Laterality Date  . CARDIAC CATHETERIZATION N/A 06/21/2015   Procedure: Left Heart Cath and Coronary Angiography;  Surgeon: Belva Crome, MD; pLAD 99%, CFX 75%, RCA 40%, EF 35%   . CARDIAC CATHETERIZATION N/A 06/21/2015   Procedure: Coronary Stent  Intervention;  Surgeon: Belva Crome, MD;  Promus Premier DES, 3.0 x 20 and 3.5 x 32 mm stents to the LAD  . CARDIAC CATHETERIZATION N/A 06/21/2015   Procedure: Intravascular Ultrasound/IVUS;  Surgeon: Belva Crome, MD;  Location: [place] [facility];  Service: Cardiovascular;  Laterality: N/A;  LAD  . CARDIAC CATHETERIZATION  06/22/2015   Procedure: Coronary/Graft Angiography;  Surgeon: Peter M Martinique, MD;  Location: [place] [facility];  Service: Cardiovascular;;   Current Outpatient Prescriptions on File Prior to Visit  Medication Sig Dispense Refill  . albuterol (PROVENTIL HFA;VENTOLIN HFA) 108 (90 Base) MCG/ACT inhaler Inhale 2 puffs into the lungs every 6 (six) hours as needed for wheezing or shortness of breath. 1 Inhaler 0  . atorvastatin (LIPITOR)  80 MG tablet Take 1 tablet (80 mg total) by mouth daily at 6 PM. 90 tablet 2  . budesonide-formoterol (SYMBICORT) 160-4.5 MCG/ACT inhaler Inhale 2 puffs into the lungs 2 (two) times daily. 1 Inhaler 1  . carvedilol (COREG) 3.125 MG tablet Take 1 tablet (3.125 mg total) by mouth 2 (two) times daily with a meal. 180 tablet 1  . Cholecalciferol (VITAMIN D3) 50000 units CAPS Take 1 capsule by mouth once a week. (Patient taking differently: Take 1 capsule by mouth every Friday. ) 12 capsule 1  . ferrous gluconate (FERGON) 324 MG tablet Take 1 tablet (324 mg total) by mouth daily with breakfast. 90 tablet 1  . gabapentin (NEURONTIN) 300 MG capsule Take 1 capsule (300 mg total) by mouth 2 (two) times daily. 60 capsule 2  . Insulin Glargine (LANTUS SOLOSTAR) 100 UNIT/ML Solostar Pen Inject 10 Units into the skin daily at 10 pm. 5 pen 2  . Insulin Pen Needle (PEN NEEDLES) 32G X 4 MM MISC 1 each by Does not apply route daily. Use with Lantus pen 100 each 5  . isosorbide mononitrate (IMDUR) 30 MG 24 hr tablet Take 0.5 tablets (15 mg total) by mouth daily. 90 tablet 1  . lisinopril (PRINIVIL,ZESTRIL) 2.5 MG tablet Take 1 tablet (2.5 mg total) by mouth daily. 90 tablet 2  . magnesium gluconate (MAGONATE) 30 MG tablet Take 1 tablet (30 mg total) by mouth daily. 30 tablet 2  . metFORMIN (GLUCOPHAGE) 1000 MG tablet Take 1 tablet (1,000 mg total) by mouth 2 (two) times daily with a meal. 180 tablet 1  . ticagrelor (BRILINTA) 90 MG TABS tablet Take 1 tablet (90 mg total) by mouth 2 (two) times daily. 180 tablet 6  . acetaminophen (TYLENOL) 500 MG tablet Take 500 mg by mouth every 6 (six) hours as needed for headache (pain).    [name] aspirin 81 MG EC tablet Take 1 tablet (81 mg total) by mouth daily. (Patient not taking: Reported on 10/12/2015) 30 tablet   . nicotine (NICODERM CQ - DOSED IN MG/24 HOURS) 21 mg/24hr patch Place 1 patch (21 mg total) onto the skin daily. (Patient not taking: Reported on 09/28/2015) 28 patch 0  .  nicotine (NICODERM CQ - DOSED IN MG/24 HOURS) 21 mg/24hr patch Place 1 patch (21 mg total) onto the skin daily. (Patient not taking: Reported on 10/08/2015) 28 patch 0  . nitroGLYCERIN (NITROSTAT) 0.4 MG SL tablet Place 1 tablet (0.4 mg total) under the tongue every 5 (five) minutes as needed for chest pain. (Patient not taking: Reported on 10/12/2015) 25 tablet 3   No current facility-administered medications on file prior to visit.      ROS as in subjective   Objective: BP  110/68   Pulse 98   Wt 233 lb (105.7 kg)   LMP 09/28/2015   BMI 34.41 kg/m    Wt Readings from Last 3 Encounters:  10/12/15 233 lb (105.7 kg)  10/08/15 228 lb (103.4 kg)  10/05/15 228 lb (103.4 kg)    General appearance: alert, no distress, WD/WN, obese AA female HEENT: normocephalic, sclerae anicteric, TMs pearly, nares patent, no discharge or erythema, pharynx normal Oral cavity: MMM, no lesions Neck: supple, no lymphadenopathy, no thyromegaly, no masses, no JVD, no bruits Heart: RRR, normal S1, S2, no murmurs Lungs: decreased breath sounds, otherwise bilaterally, no wheezes, rhonchi, or rales Abdomen: +bs, soft, non tender, non distended, no masses, no hepatomegaly, no splenomegaly, no bruits No edema Pedal pulses 1+ Right radial pulse absent, but otherwise 1+ UE pulses Neuro: non focal exam   Assessment: Encounter Diagnoses  Name Primary?  . SOB (shortness of breath) Yes  . Coronary artery disease involving native coronary artery of native heart with other form of angina pectoris ([place])   . 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 Type 2 diabetes mellitus with complication, without long-term current use of insulin ([place])   . Tobacco abuse   . Obesity   . Abnormal thyroid function test   . History of MI (myocardial infarction)   . Deficiency anemia   . DOE (dyspnea on exertion)   . Low magnesium levels      Plan: From last visit I reviewed CXR from [facility] 10/02/15 showing mild  hyperinflation, EKG showed no new changes.   Labs showed BNP of 12.51, troponin's normal, TSH a little low, but normal T4 and T3, hemoglobin of 10.9.  Rest of labs not worrisome.    Factors contributing to the SOB, DOE include possible asthma/COPD given her PFT here last visit, deconditioning, obesity, known CAD and recent MI in 06/2015, side effect of Brilinta, but can't rule out PE.   She is taking iron, she is taking the Symbicort and albuterol started last visit without improvement, taking the newly added magnesium.    D-dimer lab today, discussed possible need to repeat CT chest although she had CT for the same symptoms in 05/2015 that didn't show PE.  I called and spoke to Dr. Klein/cardiology coverage for Dr. Tamala Julian.  After discussing case the decision was made to change from Brilinta to Plavix, since Brilinta can cause SOB.   Lasix was also added today.  She does have cardiology visit scheduled with Dr. Tamala Julian 10 days from now.    Anemia - recent hgb 10.9 at [facility].   Platelets normal, normocytic anemia.  Has heavy periods since being on blood thinner since April 2017.   No hx/o fibroids.   No blood in stool.   Not taking iron.   C/t iron for now.  Abnormal thyroid - no prior thyroid medication prior, no family hx/o thyroid disease.  Recent labs from [facility] show TSH low, but T4 and T3 were normal. Will defer this for now.  Magnesium marginally low.  For not c/t low dose magnesium  Diabetes - c/t Lantus 10 QHS,  Metformin.   Advised the need to stop soda.    C/t rest of medications as usual for BP, heart, lipids.    Strongly advised she stop tobacco!    Advised if worse over the weekend, particularly chest pain, DOE, syncope, then call 911.   F/u pending labs.   Eralynn was seen today for follow-up.  Diagnoses and all  orders for this visit:  SOB (shortness of breath) -     D-dimer, quantitative (not at [facility]) -     furosemide (LASIX) 20 MG tablet; Take 1 tablet (20  mg total) by mouth daily.  Coronary artery disease involving native coronary artery of native heart with other form of angina pectoris (HCC)  Right radial artery thrombus (HCC)  Type 2 diabetes mellitus with complication, without long-term current use of insulin (HCC)  Tobacco abuse  Obesity  Abnormal thyroid function test  History of MI (myocardial infarction)  Deficiency anemia  DOE (dyspnea on exertion)  Low magnesium levels   Spent > 30 minutes face to face with patient in discussion of symptoms, evaluation, plan and recommendations.

## 2015-10-12 NOTE — Telephone Encounter (Signed)
Per Dr. Tamala Julian patient needs to switch to Effient 10 mg or Plavix 75 mg. Give a dose of either with the final dose of Brilinta than once daily. Will route to [employer] PA.

## 2015-10-12 NOTE — Telephone Encounter (Signed)
Called patient back. Informed patient that Dr. Tamala Julian will see her this morning, if she could get here before 10:00 am. Patient stated she could not get to our office before 10:00, but she is seeing her PCP later today. Encouraged patient to keep her appointment with her PCP and see if her appointment with Dr. Tamala Julian on 10/23/15 is appropriate. Patient verbalized understanding, and will call back with any questions or concerns.

## 2015-10-12 NOTE — Addendum Note (Signed)
Addended by: Arley Phenix L on: 10/12/2015 03:06 PM   Modules accepted: Orders

## 2015-10-12 NOTE — Telephone Encounter (Signed)
Call patient and let her know that Dr. Tamala Julian does want to switch her off Brilinta.  So have her take her first plavix with the next Brilinta, then no more Brilinta.   Starting tomorrow, just take Plavix daily and not Brilinta.  F/u with Dr. Tamala Julian as scheduled.   Risks of this medication is about the same as Brilinta, most notably bleeding.

## 2015-10-12 NOTE — Telephone Encounter (Signed)
Pt is aware.  

## 2015-10-15 ENCOUNTER — Encounter (HOSPITAL_COMMUNITY)
Admission: RE | Admit: 2015-10-15 | Discharge: 2015-10-15 | Disposition: A | Discharge status: Home / Self Care | Payer: 59 | Source: Ambulatory Visit | Attending: Interventional Cardiology | Admitting: Interventional Cardiology

## 2015-10-15 VITALS — BP 130/80 | HR 83 | Ht 69.0 in | Wt 231.3 lb

## 2015-10-15 DIAGNOSIS — Z955 Presence of coronary angioplasty implant and graft: Secondary | ICD-10-CM

## 2015-10-15 DIAGNOSIS — I214 Non-ST elevation (NSTEMI) myocardial infarction: Secondary | ICD-10-CM | POA: Insufficient documentation

## 2015-10-15 NOTE — Progress Notes (Signed)
Cardiac Individual Treatment Plan  Patient Details  Name: Jennifer Cervantes MRN: [id number] Date of Birth: May 10, 1980 Referring Provider:   Flowsheet Row CARDIAC REHAB PHASE II EXERCISE from 10/15/2015 in [place]  Referring Provider  Dr. Tamala Julian       Initial Encounter Date:  Flowsheet Row CARDIAC REHAB PHASE II EXERCISE from 10/15/2015 in [place]  Date  10/15/15  Referring Provider  Dr. Tamala Julian       Visit Diagnosis: NSTEMI (non-ST elevated myocardial infarction) [facility])  Stented coronary artery  Patient's Home Medications on Admission:  Current Outpatient Prescriptions:  .  acetaminophen (TYLENOL) 500 MG tablet, Take 500 mg by mouth every 6 (six) hours as needed for headache (pain)., Disp: , Rfl:  .  aspirin 81 MG EC tablet, Take 1 tablet (81 mg total) by mouth daily., Disp: 30 tablet, Rfl:  .  atorvastatin (LIPITOR) 80 MG tablet, Take 1 tablet (80 mg total) by mouth daily at 6 PM., Disp: 90 tablet, Rfl: 2 .  budesonide-formoterol (SYMBICORT) 160-4.5 MCG/ACT inhaler, Inhale 2 puffs into the lungs 2 (two) times daily., Disp: 1 Inhaler, Rfl: 1 .  carvedilol (COREG) 3.125 MG tablet, Take 1 tablet (3.125 mg total) by mouth 2 (two) times daily with a meal., Disp: 180 tablet, Rfl: 1 .  Cholecalciferol (VITAMIN D3) 50000 units CAPS, Take 1 capsule by mouth once a week. (Patient taking differently: Take 1 capsule by mouth every Friday. ), Disp: 12 capsule, Rfl: 1 .  clopidogrel (PLAVIX) 75 MG tablet, Take 1 tablet (75 mg total) by mouth daily., Disp: 30 tablet, Rfl: 11 .  ferrous gluconate (FERGON) 324 MG tablet, Take 1 tablet (324 mg total) by mouth daily with breakfast., Disp: 90 tablet, Rfl: 1 .  furosemide (LASIX) 20 MG tablet, Take 1 tablet (20 mg total) by mouth daily., Disp: 30 tablet, Rfl: 1 .  gabapentin (NEURONTIN) 300 MG capsule, Take 1 capsule (300 mg total) by mouth 2 (two) times daily., Disp: 60 capsule, Rfl: 2 .  Insulin Glargine  (LANTUS SOLOSTAR) 100 UNIT/ML Solostar Pen, Inject 10 Units into the skin daily at 10 pm., Disp: 5 pen, Rfl: 2 .  Insulin Pen Needle (PEN NEEDLES) 32G X 4 MM MISC, 1 each by Does not apply route daily. Use with Lantus pen, Disp: 100 each, Rfl: 5 .  isosorbide mononitrate (IMDUR) 30 MG 24 hr tablet, Take 0.5 tablets (15 mg total) by mouth daily., Disp: 90 tablet, Rfl: 1 .  lisinopril (PRINIVIL,ZESTRIL) 2.5 MG tablet, Take 1 tablet (2.5 mg total) by mouth daily., Disp: 90 tablet, Rfl: 2 .  metFORMIN (GLUCOPHAGE) 1000 MG tablet, Take 1 tablet (1,000 mg total) by mouth 2 (two) times daily with a meal., Disp: 180 tablet, Rfl: 1 .  nicotine (NICODERM CQ - DOSED IN MG/24 HOURS) 21 mg/24hr patch, Place 1 patch (21 mg total) onto the skin daily., Disp: 28 patch, Rfl: 0 .  nitroGLYCERIN (NITROSTAT) 0.4 MG SL tablet, Place 1 tablet (0.4 mg total) under the tongue every 5 (five) minutes as needed for chest pain., Disp: 25 tablet, Rfl: 3 .  albuterol (PROVENTIL HFA;VENTOLIN HFA) 108 (90 Base) MCG/ACT inhaler, Inhale 2 puffs into the lungs every 6 (six) hours as needed for wheezing or shortness of breath., Disp: 1 Inhaler, Rfl: 0 .  magnesium gluconate (MAGONATE) 30 MG tablet, Take 1 tablet (30 mg total) by mouth daily., Disp: 30 tablet, Rfl: 2  Past Medical History: Past Medical History:  Diagnosis Date  .  CAD (coronary artery disease)    a. LHC on 06/21/15 which showed 75% occl mid-dist LCx, 40% occl mRCA, 99% mid-dist LAD s/p DES and 50% occl mLAD s/p DES (overlapping stents) and significant LV dysfunction with anteroapical HK; b. 06/2015 Myoview: EF 55-65%, no ischemia/infarct.  . Diabetes mellitus without complication ([place])   . Hyperlipidemia   . Hypertension   . Myocardial stunning (HCC)    a. EF 45% on 2D ECHO on 06/21/15 and severe LV dysfunction on LV gram by cath. Repeat 2D ECHO with EF 55-60% and mild HK of the apicoseptal myocardium.  [name] Neuropraxia of right median nerve    a. suspected after right  radial cath. will follow with neuro outpatient if does not resolve.   . NSTEMI (non-ST elevated myocardial infarction) [facility]) 05/2015   [facility]  . Obesity   . 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 05/2015 Following PCI (radial access)-->conservatively managed.  . Tobacco abuse   . Tricuspid regurgitation   . Type II diabetes mellitus ([place])    a. Type II    Tobacco Use: History  Smoking Status  . Current Every Day Smoker  . Packs/day: 0.50  . Years: 23.00  . Types: Cigarettes  . Last attempt to quit: 06/21/2015  Smokeless Tobacco  . Never Used    Labs: Recent Review Flowsheet Data    Labs for ITP Cardiac and Pulmonary Rehab Latest Ref Rng & Units 06/22/2015 10/05/2015   Cholestrol 0 - 200 mg/dL 152 -   LDLCALC 0 - 99 mg/dL 95 -   HDL >40 mg/dL 30(L) -   Trlycerides <150 mg/dL 137 -   Hemoglobin A1c <5.7 % 9.1(H) 5.5      Capillary Blood Glucose: Lab Results  Component Value Date   GLUCAP 78 07/24/2015   GLUCAP 120 (H) 07/24/2015   GLUCAP 140 (H) 07/24/2015   GLUCAP 169 (H) 07/23/2015   GLUCAP 86 07/23/2015     Exercise Target Goals: Date: 10/15/15  Exercise Program Goal: Individual exercise prescription set with THRR, safety & activity barriers. Participant demonstrates ability to understand and report RPE using BORG scale, to self-measure pulse accurately, and to acknowledge the importance of the exercise prescription.  Exercise Prescription Goal: Starting with aerobic activity 30 plus minutes a day, 3 days per week for initial exercise prescription. Provide home exercise prescription and guidelines that participant acknowledges understanding prior to discharge.  Activity Barriers & Risk Stratification:     Activity Barriers & Cardiac Risk Stratification - 10/15/15 1421      Activity Barriers & Cardiac Risk Stratification   Activity Barriers None   Cardiac Risk Stratification High      6 Minute Walk:     6 Minute Walk    Row Name 10/17/15 1418          6 Minute Walk   Phase Initial     Distance 1400 feet     Walk Time 6 minutes     # of Rest Breaks 0     MPH 2.65     METS 3.03     RPE 9     Perceived Dyspnea  13     VO2 Peak 17.36     Symptoms No     Resting HR 83 bpm     Resting BP 130/80     Max Ex. HR 122 bpm     Max Ex. BP 152/86     2 Minute Post BP 136/82  Initial Exercise Prescription:     Initial Exercise Prescription - 10/17/15 1400      Date of Initial Exercise RX and Referring Provider   Date 10/15/15   Referring Provider Dr. Tamala Julian      Treadmill   MPH 1.3   Grade 0   Minutes 15   METs 1.9     Recumbant Elliptical   Level 1   RPM 57   Watts 73   Minutes 20   METs 4.3     Prescription Details   Frequency (times per week) 3   Duration Progress to 30 minutes of continuous aerobic without signs/symptoms of physical distress     Intensity   THRR REST +  30   THRR 40-80% of Max Heartrate [phone]   Ratings of Perceived Exertion 11-13   Perceived Dyspnea 0-4     Progression   Progression Continue progressive overload as per policy without signs/symptoms or physical distress.     Resistance Training   Training Prescription Yes   Weight 1   Reps 10-12      Perform Capillary Blood Glucose checks as needed.  Exercise Prescription Changes:   Exercise Comments:    Discharge Exercise Prescription (Final Exercise Prescription Changes):   Nutrition:  Target Goals: Understanding of nutrition guidelines, daily intake of sodium 1500mg , cholesterol 200mg , calories 30% from fat and 7% or less from saturated fats, daily to have 5 or more servings of fruits and vegetables.  Biometrics:     Pre Biometrics - 10/17/15 1421      Pre Biometrics   Height 5\' 9"  (1.753 m)   Weight 231 lb 4.2 oz (104.9 kg)   Waist Circumference 41.5 inches   Hip Circumference 45.5 inches   Waist to Hip Ratio 0.91 %   BMI (Calculated) 34.2   Triceps Skinfold 22 mm   % Body Fat 40.1 %   Grip  Strength 37.3 kg   Flexibility 15.41 in   Single Leg Stand 60 seconds       Nutrition Therapy Plan and Nutrition Goals:   Nutrition Discharge: Rate Your Plate Scores:     Nutrition Assessments - 10/15/15 1427      MEDFICTS Scores   Pre Score 68      Nutrition Goals Re-Evaluation:   Psychosocial: Target Goals: Acknowledge presence or absence of depression, maximize coping skills, provide positive support system. Participant is able to verbalize types and ability to use techniques and skills needed for reducing stress and depression.  Initial Review & Psychosocial Screening:     Initial Psych Review & Screening - 10/15/15 1449      Initial Review   Current issues with Current Anxiety/Panic;Current Stress Concerns   Source of Stress Concerns Occupation   Comments Patient has some family concerns causing her stress.      Family Dynamics   Good Support System? Yes     Barriers   Psychosocial barriers to participate in program The patient should benefit from training in stress management and relaxation.  There is fear and anxiety around ;patient having another event.      Screening Interventions   Interventions Encouraged to exercise      Quality of Life Scores:     Quality of Life - 10/17/15 1422      Quality of Life Scores   Health/Function Pre 8.87 %   Socioeconomic Pre 16.8 %   Psych/Spiritual Pre 18.86 %   Family Pre 28.5 %   GLOBAL Pre 14.94 %  PHQ-9: Recent Review Flowsheet Data    Depression screen [facility] 2/9 10/15/2015 10/08/2015 07/25/2015   Decreased Interest 0 2 3   Down, Depressed, Hopeless 1 1 2    PHQ - 2 Score 1 3 5    Altered sleeping 0 0 3   Tired, decreased energy 3 3 3    Change in appetite 1 1 1    Feeling bad or failure about yourself  0 0 3   Trouble concentrating 0 0 0   Moving slowly or fidgety/restless 1 1 0   Suicidal thoughts 0 0 0   PHQ-9 Score 6 8 15    Difficult doing work/chores - - Not difficult at all       Psychosocial Evaluation and Intervention:     Psychosocial Evaluation - 10/15/15 1453      Psychosocial Evaluation & Interventions   Interventions Stress management education;Relaxation education;Encouraged to exercise with the program and follow exercise prescription   Comments Patient's PHQ-9 score was a 6. Patient's overal QOL score was 14.94 which indicates some depression. Patient says she is a little depressed but more affraid that something more will happen to her concerning her heart.   Continued Psychosocial Services Needed No  Patient does have some anxiety issues related to her recent cardiac event. She also has some stress on her job and some family stress. She does not want counseling.       Psychosocial Re-Evaluation:   Vocational Rehabilitation: Provide vocational rehab assistance to qualifying candidates.   Vocational Rehab Evaluation & Intervention:     Vocational Rehab - 10/15/15 1427      Initial Vocational Rehab Evaluation & Intervention   Assessment shows need for Vocational Rehabilitation No      Education: Education Goals: Education classes will be provided on a weekly basis, covering required topics. Participant will state understanding/return demonstration of topics presented.  Learning Barriers/Preferences:     Learning Barriers/Preferences - 10/15/15 1422      Learning Barriers/Preferences   Learning Barriers None   Learning Preferences Verbal Instruction      Education Topics: Hypertension, Hypertension Reduction -Define heart disease and high blood pressure. Discus how high blood pressure affects the body and ways to reduce high blood pressure.   Exercise and Your Heart -Discuss why it is important to exercise, the FITT principles of exercise, normal and abnormal responses to exercise, and how to exercise safely.   Angina -Discuss definition of angina, causes of angina, treatment of angina, and how to decrease risk of having  angina.   Cardiac Medications -Review what the following cardiac medications are used for, how they affect the body, and side effects that may occur when taking the medications.  Medications include Aspirin, Beta blockers, calcium channel blockers, ACE Inhibitors, angiotensin receptor blockers, diuretics, digoxin, and antihyperlipidemics.   Congestive Heart Failure -Discuss the definition of CHF, how to live with CHF, the signs and symptoms of CHF, and how keep track of weight and sodium intake.   Heart Disease and Intimacy -Discus the effect sexual activity has on the heart, how changes occur during intimacy as we age, and safety during sexual activity.   Smoking Cessation / COPD -Discuss different methods to quit smoking, the health benefits of quitting smoking, and the definition of COPD.   Nutrition I: Fats -Discuss the types of cholesterol, what cholesterol does to the heart, and how cholesterol levels can be controlled.   Nutrition II: Labels -Discuss the different components of food labels and how to read food label  Heart Parts and Heart Disease -Discuss the anatomy of the heart, the pathway of blood circulation through the heart, and these are affected by heart disease.   Stress I: Signs and Symptoms -Discuss the causes of stress, how stress may lead to anxiety and depression, and ways to limit stress.   Stress II: Relaxation -Discuss different types of relaxation techniques to limit stress.   Warning Signs of Stroke / TIA -Discuss definition of a stroke, what the signs and symptoms are of a stroke, and how to identify when someone is having stroke.   Knowledge Questionnaire Score:     Knowledge Questionnaire Score - 10/15/15 1423      Knowledge Questionnaire Score   Pre Score 23/28      Core Components/Risk Factors/Patient Goals at Admission:     Personal Goals and Risk Factors at Admission - 10/15/15 1428      Core Components/Risk Factors/Patient  Goals on Admission    Weight Management Yes   Admit Weight 231 lb (104.8 kg)   Goal Weight: Short Term 226 lb (102.5 kg)   Goal Weight: Long Term 216 lb (98 kg)   Expected Outcomes Short Term: Continue to assess and modify interventions until short term weight is achieved;Long Term: Adherence to nutrition and physical activity/exercise program aimed toward attainment of established weight goal   Sedentary Yes   Intervention Provide advice, education, support and counseling about physical activity/exercise needs.;Develop an individualized exercise prescription for aerobic and resistive training based on initial evaluation findings, risk stratification, comorbidities and participant's personal goals.   Expected Outcomes Achievement of increased cardiorespiratory fitness and enhanced flexibility, muscular endurance and strength shown through measurements of functional capacity and personal statement of participant.   Increase Strength and Stamina Yes   Intervention Provide advice, education, support and counseling about physical activity/exercise needs.;Develop an individualized exercise prescription for aerobic and resistive training based on initial evaluation findings, risk stratification, comorbidities and participant's personal goals.   Expected Outcomes Achievement of increased cardiorespiratory fitness and enhanced flexibility, muscular endurance and strength shown through measurements of functional capacity and personal statement of participant.   Tobacco Cessation Yes   Intervention Advice worker, assist with locating and accessing local/national Quit Smoking programs, and support quit date choice.   Expected Outcomes Short Term: Will demonstrate readiness to quit, by selecting a quit date.;Long Term: Complete abstinence from all tobacco products for at least 12 months from quit date.   Improve shortness of breath with ADL's Yes   Intervention Provide education, individualized  exercise plan and daily activity instruction to help decrease symptoms of SOB with activities of daily living.   Expected Outcomes Short Term: Achieves a reduction of symptoms when performing activities of daily living.   Diabetes Yes   Intervention Provide education about signs/symptoms and action to take for hypo/hyperglycemia.;Provide education about proper nutrition, including hydration, and aerobic/resistive exercise prescription along with prescribed medications to achieve blood glucose in normal ranges: Fasting glucose 65-99 mg/dL   Expected Outcomes Short Term: Participant verbalizes understanding of the signs/symptoms and immediate care of hyper/hypoglycemia, proper foot care and importance of medication, aerobic/resistive exercise and nutrition plan for blood glucose control.   Stress Yes   Intervention Offer individual and/or small group education and counseling on adjustment to heart disease, stress management and health-related lifestyle change. Teach and support self-help strategies.   Expected Outcomes Short Term: Participant demonstrates changes in health-related behavior, relaxation and other stress management skills, ability to obtain effective social support, and compliance with  psychotropic medications if prescribed.   Personal Goal Other Yes   Personal Goal Do what I use to do without being afraid.       Core Components/Risk Factors/Patient Goals Review:    Core Components/Risk Factors/Patient Goals at Discharge (Final Review):    ITP Comments:   Comments: Patient arrived for 1st visit/orientation/education at 0700. Patient was referred to CR by Daneen Schick due to NSTEMI/Stent Placement. During orientation advised patient on arrival and appointment times what to wear, what to do before, during and after exercise. Reviewed attendance and class policy. Talked about inclement weather and class consultation policy. Pt is scheduled to return Cardiac Rehab on 10/22/15 at 8:15. Pt  was advised to come to class 15 minutes before class starts. Patient was also given instructions on meeting with the dietician and attending the Family Structure classes. Pt is eager to get started. Patient was able to complete 6 minute walk test. Patient was measured for the equipment. Discussed equipment safety with patient. Took patient pre-anthropometric measurements. Patient finished visit at 0800.

## 2015-10-15 NOTE — Progress Notes (Signed)
Cardiac/Pulmonary Rehab Medication Review by a Pharmacist  Does the patient  feel that his/her medications are working for him/her?  yes  Has the patient been experiencing any side effects to the medications prescribed?  no  Does the patient measure his/her own blood pressure or blood glucose at home?  yes (Both)  Does the patient have any problems obtaining medications due to transportation or finances?   no  Understanding of regimen: excellent Understanding of indications: excellent Potential of compliance: fair  Pharmacist comments: Good understanding of medications.  Discussed importance of compliance and indication for Plavix therapy.  [name] 10/15/2015 8:23 AM

## 2015-10-16 ENCOUNTER — Encounter: Payer: Self-pay | Admitting: Medical

## 2015-10-22 DIAGNOSIS — I5032 Chronic diastolic (congestive) heart failure: Secondary | ICD-10-CM | POA: Insufficient documentation

## 2015-10-23 ENCOUNTER — Ambulatory Visit (INDEPENDENT_AMBULATORY_CARE_PROVIDER_SITE_OTHER): Payer: 59 | Admitting: Interventional Cardiology

## 2015-10-23 ENCOUNTER — Encounter: Payer: Self-pay | Admitting: Interventional Cardiology

## 2015-10-23 VITALS — BP 116/82 | HR 90 | Ht 69.0 in | Wt 227.0 lb

## 2015-10-23 DIAGNOSIS — I5032 Chronic diastolic (congestive) heart failure: Secondary | ICD-10-CM | POA: Diagnosis not present

## 2015-10-23 DIAGNOSIS — Z720 Tobacco use: Secondary | ICD-10-CM

## 2015-10-23 DIAGNOSIS — E785 Hyperlipidemia, unspecified: Secondary | ICD-10-CM

## 2015-10-23 DIAGNOSIS — I2510 Atherosclerotic heart disease of native coronary artery without angina pectoris: Secondary | ICD-10-CM

## 2015-10-23 DIAGNOSIS — E118 Type 2 diabetes mellitus with unspecified complications: Secondary | ICD-10-CM

## 2015-10-23 DIAGNOSIS — E669 Obesity, unspecified: Secondary | ICD-10-CM

## 2015-10-23 NOTE — Progress Notes (Signed)
Cardiology Office Note    Date:  10/23/2015   ID:  SAIDE KASKA, DOB [birth year], MRN [id number]  PCP:  Crisoforo Oxford, PA-C  Cardiologist: Sinclair Grooms, MD   Chief Complaint  Patient presents with  . Coronary Artery Disease    History of Present Illness:  Jennifer Cervantes is a 35 y.o. female for follow-up of CAD, anterior infarction with LAD stent, hyperlipidemia, hypertension, type 2 diabetes, and obesity.   Still complains of intermittent chest pain. The discomfort is a sharp shooting discomfort that lasts seconds. She also has a sensation of heaviness in her chest echo last hours. Physical activity does not precipitate or aggravate the discomfort.  Complains of dyspnea on exertion. Relatively sedentary lifestyle.   Past Medical History:  Diagnosis Date  . CAD (coronary artery disease)    a. LHC on 06/21/15 which showed 75% occl mid-dist LCx, 40% occl mRCA, 99% mid-dist LAD s/p DES and 50% occl mLAD s/p DES (overlapping stents) and significant LV dysfunction with anteroapical HK; b. 06/2015 Myoview: EF 55-65%, no ischemia/infarct.  . Diabetes mellitus without complication ([place])   . Hyperlipidemia   . Hypertension   . Myocardial stunning (HCC)    a. EF 45% on 2D ECHO on 06/21/15 and severe LV dysfunction on LV gram by cath. Repeat 2D ECHO with EF 55-60% and mild HK of the apicoseptal myocardium.  [name] Neuropraxia of right median nerve    a. suspected after right radial cath. will follow with neuro outpatient if does not resolve.   . NSTEMI (non-ST elevated myocardial infarction) [facility]) 05/2015   [facility]  . Obesity   . 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 05/2015 Following PCI (radial access)-->conservatively managed.  . Tobacco abuse   . Tricuspid regurgitation   . Type II diabetes mellitus ([place])    a. Type II    Past Surgical History:  Procedure Laterality Date  . CARDIAC CATHETERIZATION N/A 06/21/2015   Procedure: Left Heart Cath and Coronary  Angiography;  Surgeon: Belva Crome, MD; pLAD 99%, CFX 75%, RCA 40%, EF 35%   . CARDIAC CATHETERIZATION N/A 06/21/2015   Procedure: Coronary Stent Intervention;  Surgeon: Belva Crome, MD;  Promus Premier DES, 3.0 x 20 and 3.5 x 32 mm stents to the LAD  . CARDIAC CATHETERIZATION N/A 06/21/2015   Procedure: Intravascular Ultrasound/IVUS;  Surgeon: Belva Crome, MD;  Location: [place] [facility];  Service: Cardiovascular;  Laterality: N/A;  LAD  . CARDIAC CATHETERIZATION  06/22/2015   Procedure: Coronary/Graft Angiography;  Surgeon: Peter M Martinique, MD;  Location: [place] [facility];  Service: Cardiovascular;;    Current Medications: Outpatient Medications Prior to Visit  Medication Sig Dispense Refill  . acetaminophen (TYLENOL) 500 MG tablet Take 500 mg by mouth every 6 (six) hours as needed for headache (pain).    [name] albuterol (PROVENTIL HFA;VENTOLIN HFA) 108 (90 Base) MCG/ACT inhaler Inhale 2 puffs into the lungs every 6 (six) hours as needed for wheezing or shortness of breath. 1 Inhaler 0  . aspirin 81 MG EC tablet Take 1 tablet (81 mg total) by mouth daily. 30 tablet   . atorvastatin (LIPITOR) 80 MG tablet Take 1 tablet (80 mg total) by mouth daily at 6 PM. 90 tablet 2  . budesonide-formoterol (SYMBICORT) 160-4.5 MCG/ACT inhaler Inhale 2 puffs into the lungs 2 (two) times daily. 1 Inhaler 1  . carvedilol (COREG) 3.125 MG tablet Take 1 tablet (3.125 mg total) by  mouth 2 (two) times daily with a meal. 180 tablet 1  . Cholecalciferol (VITAMIN D3) 50000 units CAPS Take 1 capsule by mouth once a week. (Patient taking differently: Take 1 capsule by mouth every Friday. ) 12 capsule 1  . clopidogrel (PLAVIX) 75 MG tablet Take 1 tablet (75 mg total) by mouth daily. 30 tablet 11  . ferrous gluconate (FERGON) 324 MG tablet Take 1 tablet (324 mg total) by mouth daily with breakfast. 90 tablet 1  . furosemide (LASIX) 20 MG tablet Take 1 tablet (20 mg total) by mouth daily. 30 tablet 1  . gabapentin  (NEURONTIN) 300 MG capsule Take 1 capsule (300 mg total) by mouth 2 (two) times daily. 60 capsule 2  . Insulin Glargine (LANTUS SOLOSTAR) 100 UNIT/ML Solostar Pen Inject 10 Units into the skin daily at 10 pm. 5 pen 2  . Insulin Pen Needle (PEN NEEDLES) 32G X 4 MM MISC 1 each by Does not apply route daily. Use with Lantus pen 100 each 5  . isosorbide mononitrate (IMDUR) 30 MG 24 hr tablet Take 0.5 tablets (15 mg total) by mouth daily. 90 tablet 1  . lisinopril (PRINIVIL,ZESTRIL) 2.5 MG tablet Take 1 tablet (2.5 mg total) by mouth daily. 90 tablet 2  . magnesium gluconate (MAGONATE) 30 MG tablet Take 1 tablet (30 mg total) by mouth daily. 30 tablet 2  . metFORMIN (GLUCOPHAGE) 1000 MG tablet Take 1 tablet (1,000 mg total) by mouth 2 (two) times daily with a meal. 180 tablet 1  . nicotine (NICODERM CQ - DOSED IN MG/24 HOURS) 21 mg/24hr patch Place 1 patch (21 mg total) onto the skin daily. 28 patch 0  . nitroGLYCERIN (NITROSTAT) 0.4 MG SL tablet Place 1 tablet (0.4 mg total) under the tongue every 5 (five) minutes as needed for chest pain. 25 tablet 3   No facility-administered medications prior to visit.      Allergies:   Review of patient's allergies indicates no known allergies.   Social History   Social History  . Marital status: Single    Spouse name: N/A  . Number of children: 4  . Years of education: 12   Occupational History  . Nurse [employer]   Social History Main Topics  . Smoking status: Current Every Day Smoker    Packs/day: 0.50    Years: 23.00    Types: Cigarettes    Last attempt to quit: 06/21/2015  . Smokeless tobacco: Never Used  . Alcohol use No  . Drug use: No  . Sexual activity: Not Asked   Other Topics Concern  . None   Social History Narrative   Lives in [place], works nights at [employer]   Right-handed   [place] 1 soda a day     Family History:  The patient's family history includes Healthy in her brother, father, mother, and sister.   ROS:   Please  see the history of present illness.    Hand weakness and numbness since the second heart catheterization usual dizziness, easy bruising, headaches, and unexplained weight gain.  All other systems reviewed and are negative.   PHYSICAL EXAM:   VS:  BP 116/82   Pulse 90   Ht 5\' 9"  (1.753 m)   Wt 227 lb (103 kg)   LMP 09/28/2015 (Exact Date)   BMI 33.52 kg/m    GEN: Well nourished, well developed, in no acute distress  HEENT: normal  Neck: no JVD, carotid bruits, or masses Cardiac: RRR; no murmurs, rubs,  or gallops,no edema. Absent right radial pulse.   Respiratory:  clear to auscultation bilaterally, normal work of breathing GI: soft, nontender, nondistended, + BS MS: no deformity or atrophy  Skin: warm and dry, no rash Neuro:  Alert and Oriented x 3, Strength and sensation are intact Psych: euthymic mood, full affect  Wt Readings from Last 3 Encounters:  10/23/15 227 lb (103 kg)  10/17/15 231 lb 4.2 oz (104.9 kg)  10/12/15 233 lb (105.7 kg)      Studies/Labs Reviewed:   EKG:  EKG is normal.  Recent Labs: 06/21/2015: TSH 0.184 07/24/2015: ALT 18; BUN 10; Creatinine, Ser 0.81; Hemoglobin 11.4; Platelets 273; Potassium 4.1; Sodium 136   Lipid Panel    Component Value Date/Time   CHOL 152 06/22/2015 0033   TRIG 137 06/22/2015 0033   HDL 30 (L) 06/22/2015 0033   CHOLHDL 5.1 06/22/2015 0033   VLDL 27 06/22/2015 0033   LDLCALC 95 06/22/2015 0033    Additional studies/ records that were reviewed today include:  I reviewed the consultation and follow-up notes from Dr. Geryl Councilman (vascular surgery and Dr. Jannifer Franklin (neurology). No significant explanation for the patient's median nerve type pulse he were identified. She does have a palpably absent right radial pulse although on Doppler imaging the pulse was triphasic. It was unknown if this was due to recanalization versus palmar arch circulation.    ASSESSMENT:    1. Coronary artery disease involving native coronary artery of native  heart without angina pectoris   2. Chronic diastolic heart failure ([place])   3. Hyperlipidemia   4. Tobacco abuse   5. Type 2 diabetes mellitus with complication, without long-term current use of insulin (HCC)   6. Obesity      PLAN:  In order of problems listed above:  1. Significantly modify the patient's medical regimen. Atorvastatin will be stopped for one month. A lipid panel will be obtained. Determination of statin dose will be made at that time. 2. Systolic heart failure has resolved to diastolic heart failure. Discontinue lisinopril, isosorbide mononitrate, furosemide, and carvedilol. She should call if increasing dyspnea. 3. Lipid panel in 1 month off statin therapy. 4. Encouraged to never resumed smoking. Encouraged weight loss and aerobic activity 5. Aerobic activity, plant based diet, and aerobic activity were advocated.    Medication Adjustments/Labs and Tests Ordered: Current medicines are reviewed at length with the patient today.  Concerns regarding medicines are outlined above.  Medication changes, Labs and Tests ordered today are listed in the Patient Instructions below. There are no Patient Instructions on file for this visit.   Signed, Sinclair Grooms, MD  10/23/2015 11:11 AM    [facility] [place] [address], [place], [place]  [address] Phone: [phone]; Fax: [phone]

## 2015-10-23 NOTE — Patient Instructions (Addendum)
Medication Instructions:   Stop these medications:  Coreg (carvedilol) Furosemide (lasix) Isosorbide Lisinopril Atorvastatin (lipitor)  Labwork: Your physician recommends that you return for a FASTING lipid profile /CMET in 1 month   Testing/Procedures: None   Follow-Up: Your physician wants you to follow-up in: 6 months with Dr Tamala Julian. (February 2018) You will receive a reminder letter in the mail two months in advance. If you don't receive a letter, please call our office to schedule the follow-up appointment.   Any Other Special Instructions Will Be Listed Below (If Applicable).  Follow a plant based diet.  Stay physically active.      If you need a refill on your cardiac medications before your next appointment, please call your pharmacy.

## 2015-10-31 ENCOUNTER — Encounter (HOSPITAL_COMMUNITY)
Admission: RE | Admit: 2015-10-31 | Discharge: 2015-10-31 | Disposition: A | Discharge status: Home / Self Care | Payer: 59 | Source: Ambulatory Visit | Attending: Interventional Cardiology | Admitting: Interventional Cardiology

## 2015-10-31 DIAGNOSIS — I214 Non-ST elevation (NSTEMI) myocardial infarction: Secondary | ICD-10-CM | POA: Insufficient documentation

## 2015-10-31 NOTE — Progress Notes (Signed)
Daily Session Note  Patient Details  Name: STASHA NARAINE MRN: [id number] Date of Birth: [birth year] Referring Provider:   Flowsheet Row CARDIAC REHAB PHASE II EXERCISE from 10/15/2015 in [place]  Referring Provider  Dr. Tamala Julian       Encounter Date: 10/31/2015  Check In:     Session Check In - 10/31/15 0815      Check-In   Location [facility]   Staff Present Aundra Dubin, RN, BSN;Verita Kuroda Luther Parody, BS, EP, Exercise Physiologist   Supervising physician immediately available to respond to emergencies See telemetry face sheet for immediately available MD   Medication changes reported     No   Fall or balance concerns reported    No   Warm-up and Cool-down Performed as group-led instruction   Resistance Training Performed Yes   VAD Patient? No     Pain Assessment   Currently in Pain? No/denies   Pain Score 0-No pain   Multiple Pain Sites No      Capillary Blood Glucose: No results found for this or any previous visit (from the past 24 hour(s)).   Goals Met:  Independence with exercise equipment Exercise tolerated well No report of cardiac concerns or symptoms Strength training completed today  Goals Unmet:  Not Applicable  Comments: Check out 915   Dr. Kate Sable is Medical Director for [place] and Pulmonary Rehab.

## 2015-11-02 ENCOUNTER — Encounter (HOSPITAL_COMMUNITY): Payer: 59

## 2015-11-05 ENCOUNTER — Encounter (HOSPITAL_COMMUNITY)
Admission: RE | Admit: 2015-11-05 | Discharge: 2015-11-05 | Disposition: A | Discharge status: Home / Self Care | Payer: 59 | Source: Ambulatory Visit | Attending: Interventional Cardiology | Admitting: Interventional Cardiology

## 2015-11-05 DIAGNOSIS — I214 Non-ST elevation (NSTEMI) myocardial infarction: Secondary | ICD-10-CM

## 2015-11-05 NOTE — Progress Notes (Signed)
Daily Session Note  Patient Details  Name: Jennifer Cervantes MRN: [id number] Date of Birth: 1981-01-10 Referring Provider:   Flowsheet Row CARDIAC REHAB PHASE II EXERCISE from 10/15/2015 in [place]  Referring Provider  Dr. Tamala Julian       Encounter Date: 11/05/2015  Check In:     Session Check In - 11/05/15 0815      Check-In   Location [facility]   Staff Present Aundra Dubin, RN, BSN;Diane Coad, MS, EP, [facility], Exercise Physiologist;Kamilia Carollo Luther Parody, BS, EP, Exercise Physiologist   Supervising physician immediately available to respond to emergencies See telemetry face sheet for immediately available MD   Medication changes reported     No   Fall or balance concerns reported    No   Warm-up and Cool-down Performed as group-led instruction   Resistance Training Performed Yes   VAD Patient? No     Pain Assessment   Currently in Pain? No/denies   Pain Score 0-No pain   Multiple Pain Sites No      Capillary Blood Glucose: No results found for this or any previous visit (from the past 24 hour(s)).   Goals Met:  Independence with exercise equipment Exercise tolerated well No report of cardiac concerns or symptoms Strength training completed today  Goals Unmet:  Not Applicable  Comments: Check out 915   Dr. Kate Sable is Medical Director for [place] and Pulmonary Rehab.

## 2015-11-07 ENCOUNTER — Encounter (HOSPITAL_COMMUNITY)
Admission: RE | Admit: 2015-11-07 | Discharge: 2015-11-07 | Disposition: A | Discharge status: Home / Self Care | Payer: 59 | Source: Ambulatory Visit | Attending: Interventional Cardiology | Admitting: Interventional Cardiology

## 2015-11-07 DIAGNOSIS — I214 Non-ST elevation (NSTEMI) myocardial infarction: Secondary | ICD-10-CM

## 2015-11-07 NOTE — Progress Notes (Signed)
Daily Session Note  Patient Details  Name: Jennifer Cervantes MRN: [id number] Date of Birth: 12-08-1980 Referring Provider:   Flowsheet Row CARDIAC REHAB PHASE II EXERCISE from 10/15/2015 in [place]  Referring Provider  Dr. Tamala Julian       Encounter Date: 11/07/2015  Check In:     Session Check In - 11/07/15 0815      Check-In   Location [facility]   Staff Present Russella Dar, MS, EP, [facility], Exercise Physiologist;Dalayah Deahl Luther Parody, BS, EP, Exercise Physiologist   Supervising physician immediately available to respond to emergencies See telemetry face sheet for immediately available MD   Medication changes reported     No   Fall or balance concerns reported    No   Warm-up and Cool-down Performed as group-led instruction   Resistance Training Performed Yes   VAD Patient? No     Pain Assessment   Currently in Pain? No/denies   Pain Score 0-No pain   Multiple Pain Sites No      Capillary Blood Glucose: No results found for this or any previous visit (from the past 24 hour(s)).      Exercise Prescription Changes - 11/06/15 1500      Exercise Review   Progression Yes     Response to Exercise   Blood Pressure (Admit) 108/62   Blood Pressure (Exercise) 140/74   Blood Pressure (Exit) 118/62   Heart Rate (Admit) 105 bpm   Heart Rate (Exercise) 134 bpm   Heart Rate (Exit) 99 bpm   Rating of Perceived Exertion (Exercise) 6   Duration Progress to 30 minutes of continuous aerobic without signs/symptoms of physical distress   Intensity Rest + 30     Progression   Progression Continue progressive overload as per policy without signs/symptoms or physical distress.     Resistance Training   Training Prescription Yes   Weight 2   Reps 10-12     Treadmill   MPH 1.8   Grade 0   Minutes 15   METs 2.3     Recumbant Elliptical   Level 2   RPM 53   Watts 72   Minutes 20   METs 3.8     Home Exercise Plan   Plans to continue exercise  at Home   Frequency Add 2 additional days to program exercise sessions.     Goals Met:  Independence with exercise equipment Exercise tolerated well No report of cardiac concerns or symptoms Strength training completed today  Goals Unmet:  Not Applicable  Comments: Check out 915   Dr. Kate Sable is Medical Director for [place] and Pulmonary Rehab.

## 2015-11-08 ENCOUNTER — Encounter: Payer: Self-pay | Admitting: Medical

## 2015-11-09 ENCOUNTER — Encounter (HOSPITAL_COMMUNITY): Payer: 59

## 2015-11-09 NOTE — Progress Notes (Signed)
Cardiac Individual Treatment Plan  Patient Details  Name: Jennifer Cervantes MRN: [id number] Date of Birth: [birth year] Referring Provider:   Flowsheet Row CARDIAC REHAB PHASE II EXERCISE from 10/15/2015 in [place]  Referring Provider  Dr. Tamala Julian       Initial Encounter Date:  Flowsheet Row CARDIAC REHAB PHASE II EXERCISE from 10/15/2015 in [place]  Date  10/15/15  Referring Provider  Dr. Tamala Julian       Visit Diagnosis: NSTEMI (non-ST elevated myocardial infarction) [facility])  Patient's Home Medications on Admission:  Current Outpatient Prescriptions:  .  acetaminophen (TYLENOL) 500 MG tablet, Take 500 mg by mouth every 6 (six) hours as needed for headache (pain)., Disp: , Rfl:  .  albuterol (PROVENTIL HFA;VENTOLIN HFA) 108 (90 Base) MCG/ACT inhaler, Inhale 2 puffs into the lungs every 6 (six) hours as needed for wheezing or shortness of breath., Disp: 1 Inhaler, Rfl: 0 .  aspirin 81 MG EC tablet, Take 1 tablet (81 mg total) by mouth daily., Disp: 30 tablet, Rfl:  .  budesonide-formoterol (SYMBICORT) 160-4.5 MCG/ACT inhaler, Inhale 2 puffs into the lungs 2 (two) times daily., Disp: 1 Inhaler, Rfl: 1 .  Cholecalciferol (VITAMIN D3) 50000 units CAPS, Take 1 capsule by mouth once a week. (Patient taking differently: Take 1 capsule by mouth every Friday. ), Disp: 12 capsule, Rfl: 1 .  clopidogrel (PLAVIX) 75 MG tablet, Take 1 tablet (75 mg total) by mouth daily., Disp: 30 tablet, Rfl: 11 .  ferrous gluconate (FERGON) 324 MG tablet, Take 1 tablet (324 mg total) by mouth daily with breakfast., Disp: 90 tablet, Rfl: 1 .  gabapentin (NEURONTIN) 300 MG capsule, Take 1 capsule (300 mg total) by mouth 2 (two) times daily., Disp: 60 capsule, Rfl: 2 .  Insulin Glargine (LANTUS SOLOSTAR) 100 UNIT/ML Solostar Pen, Inject 10 Units into the skin daily at 10 pm., Disp: 5 pen, Rfl: 2 .  Insulin Pen Needle (PEN NEEDLES) 32G X 4 MM MISC, 1 each by Does not apply  route daily. Use with Lantus pen, Disp: 100 each, Rfl: 5 .  magnesium gluconate (MAGONATE) 30 MG tablet, Take 1 tablet (30 mg total) by mouth daily., Disp: 30 tablet, Rfl: 2 .  metFORMIN (GLUCOPHAGE) 1000 MG tablet, Take 1 tablet (1,000 mg total) by mouth 2 (two) times daily with a meal., Disp: 180 tablet, Rfl: 1 .  nicotine (NICODERM CQ - DOSED IN MG/24 HOURS) 21 mg/24hr patch, Place 1 patch (21 mg total) onto the skin daily., Disp: 28 patch, Rfl: 0 .  nitroGLYCERIN (NITROSTAT) 0.4 MG SL tablet, Place 1 tablet (0.4 mg total) under the tongue every 5 (five) minutes as needed for chest pain., Disp: 25 tablet, Rfl: 3  Past Medical History: Past Medical History:  Diagnosis Date  . CAD (coronary artery disease)    a. LHC on 06/21/15 which showed 75% occl mid-dist LCx, 40% occl mRCA, 99% mid-dist LAD s/p DES and 50% occl mLAD s/p DES (overlapping stents) and significant LV dysfunction with anteroapical HK; b. 06/2015 Myoview: EF 55-65%, no ischemia/infarct.  . Diabetes mellitus without complication ([place])   . Hyperlipidemia   . Hypertension   . Myocardial stunning (HCC)    a. EF 45% on 2D ECHO on 06/21/15 and severe LV dysfunction on LV gram by cath. Repeat 2D ECHO with EF 55-60% and mild HK of the apicoseptal myocardium.  [name] Neuropraxia of right median nerve    a. suspected after right radial cath. will follow with neuro  outpatient if does not resolve.   . NSTEMI (non-ST elevated myocardial infarction) [facility]) 05/2015   [facility]  . Obesity   . 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 05/2015 Following PCI (radial access)-->conservatively managed.  . Tobacco abuse   . Tricuspid regurgitation   . Type II diabetes mellitus ([place])    a. Type II    Tobacco Use: History  Smoking Status  . Current Every Day Smoker  . Packs/day: 0.50  . Years: 23.00  . Types: Cigarettes  . Last attempt to quit: 06/21/2015  Smokeless Tobacco  . Never Used    Labs: Recent Review Flowsheet Data    Labs for ITP  Cardiac and Pulmonary Rehab Latest Ref Rng & Units 06/22/2015 10/05/2015   Cholestrol 0 - 200 mg/dL 152 -   LDLCALC 0 - 99 mg/dL 95 -   HDL >40 mg/dL 30(L) -   Trlycerides <150 mg/dL 137 -   Hemoglobin A1c <5.7 % 9.1(H) 5.5      Capillary Blood Glucose: Lab Results  Component Value Date   GLUCAP 78 07/24/2015   GLUCAP 120 (H) 07/24/2015   GLUCAP 140 (H) 07/24/2015   GLUCAP 169 (H) 07/23/2015   GLUCAP 86 07/23/2015     Exercise Target Goals:    Exercise Program Goal: Individual exercise prescription set with THRR, safety & activity barriers. Participant demonstrates ability to understand and report RPE using BORG scale, to self-measure pulse accurately, and to acknowledge the importance of the exercise prescription.  Exercise Prescription Goal: Starting with aerobic activity 30 plus minutes a day, 3 days per week for initial exercise prescription. Provide home exercise prescription and guidelines that participant acknowledges understanding prior to discharge.  Activity Barriers & Risk Stratification:     Activity Barriers & Cardiac Risk Stratification - 10/15/15 1421      Activity Barriers & Cardiac Risk Stratification   Activity Barriers None   Cardiac Risk Stratification High      6 Minute Walk:     6 Minute Walk    Row Name 10/17/15 1418         6 Minute Walk   Phase Initial     Distance 1400 feet     Walk Time 6 minutes     # of Rest Breaks 0     MPH 2.65     METS 3.03     RPE 9     Perceived Dyspnea  13     VO2 Peak 17.36     Symptoms No     Resting HR 83 bpm     Resting BP 130/80     Max Ex. HR 122 bpm     Max Ex. BP 152/86     2 Minute Post BP 136/82        Initial Exercise Prescription:     Initial Exercise Prescription - 10/17/15 1400      Date of Initial Exercise RX and Referring Provider   Date 10/15/15   Referring Provider Dr. Tamala Julian      Treadmill   MPH 1.3   Grade 0   Minutes 15   METs 1.9     Recumbant Elliptical   Level 1    RPM 57   Watts 73   Minutes 20   METs 4.3     Prescription Details   Frequency (times per week) 3   Duration Progress to 30 minutes of continuous aerobic without signs/symptoms of physical distress     Intensity  THRR REST +  30   THRR 40-80% of Max Heartrate [phone]   Ratings of Perceived Exertion 11-13   Perceived Dyspnea 0-4     Progression   Progression Continue progressive overload as per policy without signs/symptoms or physical distress.     Resistance Training   Training Prescription Yes   Weight 1   Reps 10-12      Perform Capillary Blood Glucose checks as needed.  Exercise Prescription Changes:      Exercise Prescription Changes    Row Name 11/06/15 1500             Exercise Review   Progression Yes         Response to Exercise   Blood Pressure (Admit) 108/62       Blood Pressure (Exercise) 140/74       Blood Pressure (Exit) 118/62       Heart Rate (Admit) 105 bpm       Heart Rate (Exercise) 134 bpm       Heart Rate (Exit) 99 bpm       Rating of Perceived Exertion (Exercise) 6       Duration Progress to 30 minutes of continuous aerobic without signs/symptoms of physical distress       Intensity Rest + 30         Progression   Progression Continue progressive overload as per policy without signs/symptoms or physical distress.         Resistance Training   Training Prescription Yes       Weight 2       Reps 10-12         Treadmill   MPH 1.8       Grade 0       Minutes 15       METs 2.3         Recumbant Elliptical   Level 2       RPM 53       Watts 72       Minutes 20       METs 3.8         Home Exercise Plan   Plans to continue exercise at Home       Frequency Add 2 additional days to program exercise sessions.          Exercise Comments:      Exercise Comments    Row Name 11/06/15 1538           Exercise Comments Patient is proggressing appropriately           Discharge Exercise Prescription (Final  Exercise Prescription Changes):     Exercise Prescription Changes - 11/06/15 1500      Exercise Review   Progression Yes     Response to Exercise   Blood Pressure (Admit) 108/62   Blood Pressure (Exercise) 140/74   Blood Pressure (Exit) 118/62   Heart Rate (Admit) 105 bpm   Heart Rate (Exercise) 134 bpm   Heart Rate (Exit) 99 bpm   Rating of Perceived Exertion (Exercise) 6   Duration Progress to 30 minutes of continuous aerobic without signs/symptoms of physical distress   Intensity Rest + 30     Progression   Progression Continue progressive overload as per policy without signs/symptoms or physical distress.     Resistance Training   Training Prescription Yes   Weight 2   Reps 10-12     Treadmill   MPH 1.8   Grade 0  Minutes 15   METs 2.3     Recumbant Elliptical   Level 2   RPM 53   Watts 72   Minutes 20   METs 3.8     Home Exercise Plan   Plans to continue exercise at Home   Frequency Add 2 additional days to program exercise sessions.      Nutrition:  Target Goals: Understanding of nutrition guidelines, daily intake of sodium 1500mg , cholesterol 200mg , calories 30% from fat and 7% or less from saturated fats, daily to have 5 or more servings of fruits and vegetables.  Biometrics:     Pre Biometrics - 10/17/15 1421      Pre Biometrics   Height 5\' 9"  (1.753 m)   Weight 231 lb 4.2 oz (104.9 kg)   Waist Circumference 41.5 inches   Hip Circumference 45.5 inches   Waist to Hip Ratio 0.91 %   BMI (Calculated) 34.2   Triceps Skinfold 22 mm   % Body Fat 40.1 %   Grip Strength 37.3 kg   Flexibility 15.41 in   Single Leg Stand 60 seconds       Nutrition Therapy Plan and Nutrition Goals:   Nutrition Discharge: Rate Your Plate Scores:     Nutrition Assessments - 10/15/15 1427      MEDFICTS Scores   Pre Score 68      Nutrition Goals Re-Evaluation:   Psychosocial: Target Goals: Acknowledge presence or absence of depression, maximize  coping skills, provide positive support system. Participant is able to verbalize types and ability to use techniques and skills needed for reducing stress and depression.  Initial Review & Psychosocial Screening:     Initial Psych Review & Screening - 10/15/15 1449      Initial Review   Current issues with Current Anxiety/Panic;Current Stress Concerns   Source of Stress Concerns Occupation   Comments Patient has some family concerns causing her stress.      Family Dynamics   Good Support System? Yes     Barriers   Psychosocial barriers to participate in program The patient should benefit from training in stress management and relaxation.  There is fear and anxiety around ;patient having another event.      Screening Interventions   Interventions Encouraged to exercise      Quality of Life Scores:     Quality of Life - 10/17/15 1422      Quality of Life Scores   Health/Function Pre 8.87 %   Socioeconomic Pre 16.8 %   Psych/Spiritual Pre 18.86 %   Family Pre 28.5 %   GLOBAL Pre 14.94 %      PHQ-9: Recent Review Flowsheet Data    Depression screen [facility] 2/9 10/15/2015 10/08/2015 07/25/2015   Decreased Interest 0 2 3   Down, Depressed, Hopeless 1 1 2    PHQ - 2 Score 1 3 5    Altered sleeping 0 0 3   Tired, decreased energy 3 3 3    Change in appetite 1 1 1    Feeling bad or failure about yourself  0 0 3   Trouble concentrating 0 0 0   Moving slowly or fidgety/restless 1 1 0   Suicidal thoughts 0 0 0   PHQ-9 Score 6 8 15    Difficult doing work/chores - - Not difficult at all      Psychosocial Evaluation and Intervention:     Psychosocial Evaluation - 10/15/15 1453      Psychosocial Evaluation & Interventions   Interventions Stress management  education;Relaxation education;Encouraged to exercise with the program and follow exercise prescription   Comments Patient's PHQ-9 score was a 6. Patient's overal QOL score was 14.94 which indicates some depression. Patient says  she is a little depressed but more affraid that something more will happen to her concerning her heart.   Continued Psychosocial Services Needed No  Patient does have some anxiety issues related to her recent cardiac event. She also has some stress on her job and some family stress. She does not want counseling.       Psychosocial Re-Evaluation:     Psychosocial Re-Evaluation    [place] Name 11/05/15 1424             Psychosocial Re-Evaluation   Interventions Encouraged to attend Cardiac Rehabilitation for the exercise       Comments Patient's QOL score was 14.94 and her PHQ-9 score was 6. Patient has some anxiety and depression issues but she feels she does not need conunseling.           Vocational Rehabilitation: Provide vocational rehab assistance to qualifying candidates.   Vocational Rehab Evaluation & Intervention:     Vocational Rehab - 10/15/15 1427      Initial Vocational Rehab Evaluation & Intervention   Assessment shows need for Vocational Rehabilitation No      Education: Education Goals: Education classes will be provided on a weekly basis, covering required topics. Participant will state understanding/return demonstration of topics presented.  Learning Barriers/Preferences:     Learning Barriers/Preferences - 10/15/15 1422      Learning Barriers/Preferences   Learning Barriers None   Learning Preferences Verbal Instruction      Education Topics: Hypertension, Hypertension Reduction -Define heart disease and high blood pressure. Discus how high blood pressure affects the body and ways to reduce high blood pressure.   Exercise and Your Heart -Discuss why it is important to exercise, the FITT principles of exercise, normal and abnormal responses to exercise, and how to exercise safely.   Angina -Discuss definition of angina, causes of angina, treatment of angina, and how to decrease risk of having angina.   Cardiac Medications -Review what the  following cardiac medications are used for, how they affect the body, and side effects that may occur when taking the medications.  Medications include Aspirin, Beta blockers, calcium channel blockers, ACE Inhibitors, angiotensin receptor blockers, diuretics, digoxin, and antihyperlipidemics.   Congestive Heart Failure -Discuss the definition of CHF, how to live with CHF, the signs and symptoms of CHF, and how keep track of weight and sodium intake.   Heart Disease and Intimacy -Discus the effect sexual activity has on the heart, how changes occur during intimacy as we age, and safety during sexual activity. Flowsheet Row CARDIAC REHAB PHASE II EXERCISE from 10/31/2015 in [place]  Date  10/31/15  Educator  DJ  Instruction Review Code  2- meets goals/outcomes      Smoking Cessation / COPD -Discuss different methods to quit smoking, the health benefits of quitting smoking, and the definition of COPD.   Nutrition I: Fats -Discuss the types of cholesterol, what cholesterol does to the heart, and how cholesterol levels can be controlled.   Nutrition II: Labels -Discuss the different components of food labels and how to read food label   Heart Parts and Heart Disease -Discuss the anatomy of the heart, the pathway of blood circulation through the heart, and these are affected by heart disease.   Stress I: Signs and Symptoms -Discuss  the causes of stress, how stress may lead to anxiety and depression, and ways to limit stress.   Stress II: Relaxation -Discuss different types of relaxation techniques to limit stress.   Warning Signs of Stroke / TIA -Discuss definition of a stroke, what the signs and symptoms are of a stroke, and how to identify when someone is having stroke.   Knowledge Questionnaire Score:     Knowledge Questionnaire Score - 10/15/15 1423      Knowledge Questionnaire Score   Pre Score 23/28      Core Components/Risk  Factors/Patient Goals at Admission:     Personal Goals and Risk Factors at Admission - 10/15/15 1428      Core Components/Risk Factors/Patient Goals on Admission    Weight Management Yes   Admit Weight 231 lb (104.8 kg)   Goal Weight: Short Term 226 lb (102.5 kg)   Goal Weight: Long Term 216 lb (98 kg)   Expected Outcomes Short Term: Continue to assess and modify interventions until short term weight is achieved;Long Term: Adherence to nutrition and physical activity/exercise program aimed toward attainment of established weight goal   Sedentary Yes   Intervention Provide advice, education, support and counseling about physical activity/exercise needs.;Develop an individualized exercise prescription for aerobic and resistive training based on initial evaluation findings, risk stratification, comorbidities and participant's personal goals.   Expected Outcomes Achievement of increased cardiorespiratory fitness and enhanced flexibility, muscular endurance and strength shown through measurements of functional capacity and personal statement of participant.   Increase Strength and Stamina Yes   Intervention Provide advice, education, support and counseling about physical activity/exercise needs.;Develop an individualized exercise prescription for aerobic and resistive training based on initial evaluation findings, risk stratification, comorbidities and participant's personal goals.   Expected Outcomes Achievement of increased cardiorespiratory fitness and enhanced flexibility, muscular endurance and strength shown through measurements of functional capacity and personal statement of participant.   Tobacco Cessation Yes   Intervention Advice worker, assist with locating and accessing local/national Quit Smoking programs, and support quit date choice.   Expected Outcomes Short Term: Will demonstrate readiness to quit, by selecting a quit date.;Long Term: Complete abstinence from all  tobacco products for at least 12 months from quit date.   Improve shortness of breath with ADL's Yes   Intervention Provide education, individualized exercise plan and daily activity instruction to help decrease symptoms of SOB with activities of daily living.   Expected Outcomes Short Term: Achieves a reduction of symptoms when performing activities of daily living.   Diabetes Yes   Intervention Provide education about signs/symptoms and action to take for hypo/hyperglycemia.;Provide education about proper nutrition, including hydration, and aerobic/resistive exercise prescription along with prescribed medications to achieve blood glucose in normal ranges: Fasting glucose 65-99 mg/dL   Expected Outcomes Short Term: Participant verbalizes understanding of the signs/symptoms and immediate care of hyper/hypoglycemia, proper foot care and importance of medication, aerobic/resistive exercise and nutrition plan for blood glucose control.   Stress Yes   Intervention Offer individual and/or small group education and counseling on adjustment to heart disease, stress management and health-related lifestyle change. Teach and support self-help strategies.   Expected Outcomes Short Term: Participant demonstrates changes in health-related behavior, relaxation and other stress management skills, ability to obtain effective social support, and compliance with psychotropic medications if prescribed.   Personal Goal Other Yes   Personal Goal Do what I use to do without being afraid.       Core Components/Risk Factors/Patient Goals Review:  Goals and Risk Factor Review    Row Name 11/09/15 1420             Core Components/Risk Factors/Patient Goals Review   Personal Goals Review Weight Management/Obesity;Increase Strength and Stamina;Improve shortness of breath with ADL's;Other;Tobacco Cessation  Do what she used to do without being afraid.        Review After 4 sessions, patient has lost 1.1 lbs.   Patient says it is too soon to tell if program has improved her SOB or strength.        Expected Outcomes Patient will continue to attend program with improved SOB, increased strength and stamina, weight loss and tobacco cessation.           Core Components/Risk Factors/Patient Goals at Discharge (Final Review):      Goals and Risk Factor Review - 11/09/15 1420      Core Components/Risk Factors/Patient Goals Review   Personal Goals Review Weight Management/Obesity;Increase Strength and Stamina;Improve shortness of breath with ADL's;Other;Tobacco Cessation  Do what she used to do without being afraid.    Review After 4 sessions, patient has lost 1.1 lbs.  Patient says it is too soon to tell if program has improved her SOB or strength.    Expected Outcomes Patient will continue to attend program with improved SOB, increased strength and stamina, weight loss and tobacco cessation.       ITP Comments:   Comments: 30 Day Review:  Patient doing well in program. Will continue to monitor for progress.

## 2015-11-12 ENCOUNTER — Encounter (HOSPITAL_COMMUNITY)
Admission: RE | Admit: 2015-11-12 | Discharge: 2015-11-12 | Disposition: A | Discharge status: Home / Self Care | Payer: 59 | Source: Ambulatory Visit | Attending: Interventional Cardiology | Admitting: Interventional Cardiology

## 2015-11-12 DIAGNOSIS — I214 Non-ST elevation (NSTEMI) myocardial infarction: Secondary | ICD-10-CM

## 2015-11-12 NOTE — Progress Notes (Signed)
Daily Session Note  Patient Details  Name: Jennifer Cervantes MRN: [id number] Date of Birth: 1980/06/15 Referring Provider:   Flowsheet Row CARDIAC REHAB PHASE II EXERCISE from 10/15/2015 in [place]  Referring Provider  Dr. Tamala Julian       Encounter Date: 11/12/2015  Check In:     Session Check In - 11/12/15 0815      Check-In   Location [facility]   Staff Present Russella Dar, MS, EP, [facility], Exercise Physiologist;Ebelin Dillehay Luther Parody, BS, EP, Exercise Physiologist   Supervising physician immediately available to respond to emergencies See telemetry face sheet for immediately available MD   Medication changes reported     No   Fall or balance concerns reported    No   Warm-up and Cool-down Performed as group-led instruction   Resistance Training Performed Yes   VAD Patient? No     Pain Assessment   Currently in Pain? Other (Comment)   Pain Score 0-No pain   Multiple Pain Sites No      Capillary Blood Glucose: No results found for this or any previous visit (from the past 24 hour(s)).   Goals Met:  Independence with exercise equipment Exercise tolerated well No report of cardiac concerns or symptoms Strength training completed today  Goals Unmet:  Not Applicable  Comments: Check out 915   Dr. Kate Sable is Medical Director for [place] and Pulmonary Rehab.

## 2015-11-14 ENCOUNTER — Encounter (HOSPITAL_COMMUNITY)
Admission: RE | Admit: 2015-11-14 | Discharge: 2015-11-14 | Disposition: A | Discharge status: Home / Self Care | Payer: 59 | Source: Ambulatory Visit | Attending: Interventional Cardiology | Admitting: Interventional Cardiology

## 2015-11-14 DIAGNOSIS — I214 Non-ST elevation (NSTEMI) myocardial infarction: Secondary | ICD-10-CM | POA: Diagnosis not present

## 2015-11-14 NOTE — Progress Notes (Signed)
Daily Session Note  Patient Details  Name: Jennifer Cervantes MRN: [id number] Date of Birth: 1980/12/11 Referring Provider:   Flowsheet Row CARDIAC REHAB PHASE II EXERCISE from 10/15/2015 in [place]  Referring Provider  Dr. Tamala Julian       Encounter Date: 11/14/2015  Check In:     Session Check In - 11/14/15 0815      Check-In   Location [facility]   Staff Present Russella Dar, MS, EP, [facility], Exercise Physiologist;Kerrilynn Derenzo Luther Parody, BS, EP, Exercise Physiologist   Supervising physician immediately available to respond to emergencies See telemetry face sheet for immediately available MD   Medication changes reported     No   Fall or balance concerns reported    No   Warm-up and Cool-down Performed as group-led instruction   Resistance Training Performed Yes   VAD Patient? No     Pain Assessment   Currently in Pain? No/denies   Pain Score 0-No pain   Multiple Pain Sites No      Capillary Blood Glucose: No results found for this or any previous visit (from the past 24 hour(s)).   Goals Met:  Independence with exercise equipment Exercise tolerated well No report of cardiac concerns or symptoms Strength training completed today  Goals Unmet:  Not Applicable  Comments: Check out 915   Dr. Kate Sable is Medical Director for [place] and Pulmonary Rehab.

## 2015-11-16 ENCOUNTER — Encounter (HOSPITAL_COMMUNITY)
Admission: RE | Admit: 2015-11-16 | Discharge: 2015-11-16 | Disposition: A | Discharge status: Home / Self Care | Payer: 59 | Source: Ambulatory Visit | Attending: Interventional Cardiology | Admitting: Interventional Cardiology

## 2015-11-16 DIAGNOSIS — I214 Non-ST elevation (NSTEMI) myocardial infarction: Secondary | ICD-10-CM | POA: Diagnosis not present

## 2015-11-16 NOTE — Progress Notes (Signed)
Daily Session Note  Patient Details  Name: Jennifer Cervantes MRN: [id number] Date of Birth: [birth year] Referring Provider:   Flowsheet Row CARDIAC REHAB PHASE II EXERCISE from 10/15/2015 in [place]  Referring Provider  Dr. Tamala Julian       Encounter Date: 11/16/2015  Check In:     Session Check In - 11/16/15 0815      Check-In   Location [facility]   Staff Present Aundra Dubin, RN, BSN;Delman Goshorn Luther Parody, BS, EP, Exercise Physiologist   Supervising physician immediately available to respond to emergencies See telemetry face sheet for immediately available MD   Medication changes reported     No   Fall or balance concerns reported    No   Warm-up and Cool-down Performed as group-led instruction   Resistance Training Performed Yes   VAD Patient? No     Pain Assessment   Currently in Pain? No/denies   Pain Score 0-No pain   Multiple Pain Sites No      Capillary Blood Glucose: No results found for this or any previous visit (from the past 24 hour(s)).   Goals Met:  Independence with exercise equipment Exercise tolerated well No report of cardiac concerns or symptoms Strength training completed today  Goals Unmet:  Not Applicable  Comments: Check out 915   Dr. Kate Sable is Medical Director for [place] and Pulmonary Rehab.

## 2015-11-19 ENCOUNTER — Encounter (HOSPITAL_COMMUNITY)
Admission: RE | Admit: 2015-11-19 | Discharge: 2015-11-19 | Disposition: A | Discharge status: Home / Self Care | Payer: 59 | Source: Ambulatory Visit | Attending: Interventional Cardiology | Admitting: Interventional Cardiology

## 2015-11-19 DIAGNOSIS — Z955 Presence of coronary angioplasty implant and graft: Secondary | ICD-10-CM

## 2015-11-19 DIAGNOSIS — I214 Non-ST elevation (NSTEMI) myocardial infarction: Secondary | ICD-10-CM | POA: Diagnosis not present

## 2015-11-19 NOTE — Progress Notes (Signed)
Daily Session Note  Patient Details  Name: Jennifer Cervantes MRN: [id number] Date of Birth: 03/14/1980 Referring Provider:   Flowsheet Row CARDIAC REHAB PHASE II EXERCISE from 10/15/2015 in [place] [facility]  Referring Provider  Dr. Smith       Encounter Date: 11/19/2015  Check In:     Session Check In - 11/19/15 0815      Check-In   Location [facility]   Staff Present  , MS, EP, [facility], Exercise Physiologist;Gregory Cowan, BS, EP, Exercise Physiologist   Supervising physician immediately available to respond to emergencies See telemetry face sheet for immediately available MD   Medication changes reported     No   Fall or balance concerns reported    No   Warm-up and Cool-down Performed as group-led instruction   Resistance Training Performed Yes   VAD Patient? No     Pain Assessment   Currently in Pain? No/denies   Pain Score 0-No pain   Multiple Pain Sites No      Capillary Blood Glucose: No results found for this or any previous visit (from the past 24 hour(s)).   Goals Met:  Independence with exercise equipment Exercise tolerated well No report of cardiac concerns or symptoms Strength training completed today  Goals Unmet:  Not Applicable  Comments: Check out 0915   Dr. Suresh Koneswaran is Medical Director for [place] [facility]. 

## 2015-11-20 MED FILL — FUROSEMIDE 20 MG TABLET: 20 | 30 days supply | Qty: 30 | Fill #1

## 2015-11-20 MED FILL — CLOPIDOGREL 75 MG TABLET: 75 | 30 days supply | Qty: 30 | Fill #1

## 2015-11-21 ENCOUNTER — Encounter (HOSPITAL_COMMUNITY)
Admission: RE | Admit: 2015-11-21 | Discharge: 2015-11-21 | Disposition: A | Discharge status: Home / Self Care | Payer: 59 | Source: Ambulatory Visit | Attending: Interventional Cardiology | Admitting: Interventional Cardiology

## 2015-11-21 DIAGNOSIS — I214 Non-ST elevation (NSTEMI) myocardial infarction: Secondary | ICD-10-CM

## 2015-11-21 NOTE — Progress Notes (Signed)
Daily Session Note  Patient Details  Name: Jennifer Cervantes MRN: [id number] Date of Birth: 06/08/1980 Referring Provider:   Flowsheet Row CARDIAC REHAB PHASE II EXERCISE from 10/15/2015 in [place]  Referring Provider  Dr. Tamala Julian       Encounter Date: 11/21/2015  Check In:     Session Check In - 11/21/15 0815      Check-In   Location [facility]   Staff Present Russella Dar, MS, EP, [facility], Exercise Physiologist;Debra Wynetta Emery, RN, BSN;Nirav Sweda, BS, EP, Exercise Physiologist   Supervising physician immediately available to respond to emergencies See telemetry face sheet for immediately available MD   Medication changes reported     No   Fall or balance concerns reported    No   Warm-up and Cool-down Performed as group-led instruction   Resistance Training Performed Yes   VAD Patient? No     Pain Assessment   Currently in Pain? No/denies   Pain Score 0-No pain   Multiple Pain Sites No      Capillary Blood Glucose: No results found for this or any previous visit (from the past 24 hour(s)).   Goals Met:  Independence with exercise equipment Exercise tolerated well No report of cardiac concerns or symptoms Strength training completed today  Goals Unmet:  Not Applicable  Comments: Check out 915   Dr. Kate Sable is Medical Director for [place] and Pulmonary Rehab.

## 2015-11-22 ENCOUNTER — Other Ambulatory Visit: Payer: 59 | Admitting: *Deleted

## 2015-11-22 DIAGNOSIS — I2510 Atherosclerotic heart disease of native coronary artery without angina pectoris: Secondary | ICD-10-CM | POA: Diagnosis not present

## 2015-11-22 DIAGNOSIS — E669 Obesity, unspecified: Secondary | ICD-10-CM

## 2015-11-22 DIAGNOSIS — Z720 Tobacco use: Secondary | ICD-10-CM

## 2015-11-22 DIAGNOSIS — E785 Hyperlipidemia, unspecified: Secondary | ICD-10-CM | POA: Diagnosis not present

## 2015-11-22 DIAGNOSIS — I5032 Chronic diastolic (congestive) heart failure: Secondary | ICD-10-CM

## 2015-11-22 DIAGNOSIS — E118 Type 2 diabetes mellitus with unspecified complications: Secondary | ICD-10-CM | POA: Diagnosis not present

## 2015-11-22 LAB — COMPREHENSIVE METABOLIC PANEL
ALBUMIN: 4 g/dL (ref 3.6–5.1)
ALK PHOS: 61 U/L (ref 33–115)
ALT: 12 U/L (ref 6–29)
AST: 12 U/L (ref 10–30)
BILIRUBIN TOTAL: 0.2 mg/dL (ref 0.2–1.2)
BUN: 13 mg/dL (ref 7–25)
CALCIUM: 9.2 mg/dL (ref 8.6–10.2)
CO2: 19 mmol/L — ABNORMAL LOW (ref 20–31)
Chloride: 110 mmol/L (ref 98–110)
Creat: 0.72 mg/dL (ref 0.50–1.10)
GLUCOSE: 117 mg/dL — AB (ref 65–99)
Potassium: 4.2 mmol/L (ref 3.5–5.3)
Sodium: 139 mmol/L (ref 135–146)
Total Protein: 6.8 g/dL (ref 6.1–8.1)

## 2015-11-22 LAB — LIPID PANEL
CHOLESTEROL: 159 mg/dL (ref 125–200)
HDL: 44 mg/dL — AB (ref >=46)
LDL CALC: 103 mg/dL (ref <130)
TRIGLYCERIDES: 58 mg/dL (ref <150)
Total CHOL/HDL Ratio: 3.6 Ratio (ref <=5.0)
VLDL: 12 mg/dL (ref <30)

## 2015-11-23 ENCOUNTER — Encounter (HOSPITAL_COMMUNITY): Payer: 59

## 2015-11-23 ENCOUNTER — Telehealth: Payer: Self-pay | Admitting: *Deleted

## 2015-11-23 DIAGNOSIS — E785 Hyperlipidemia, unspecified: Secondary | ICD-10-CM

## 2015-11-23 MED ORDER — ROSUVASTATIN CALCIUM 5 MG PO TABS: 5.0000 mg | ORAL_TABLET | Freq: Every day | ORAL | 3 refills | Status: DC

## 2015-11-23 MED FILL — ROSUVASTATIN CALCIUM 5 MG T: 5 | 90 days supply | Qty: 90 | Fill #0

## 2015-11-23 NOTE — Telephone Encounter (Signed)
Informed pt of lab results and recommendations per Dr. Tamala Julian.  Pt verbalized understanding and was in agreement with this plan.  Labs scheduled for 12/1.

## 2015-11-23 NOTE — Telephone Encounter (Signed)
-----   Message from Belva Crome, MD sent at 11/23/2015  1:14 PM EDT ----- Let the patient know the lipid panel is mildly abnormal. Start Crestor 5 mg daily. Repeat liver and lipid panel in 2 months. This will be a significant reduction in statin intensity equivalent to atorvastatin 10 mg daily (was previously on 80 mg). A copy will be sent to Crisoforo Oxford, PA-C

## 2015-11-26 ENCOUNTER — Encounter (HOSPITAL_COMMUNITY)
Admission: RE | Admit: 2015-11-26 | Discharge: 2015-11-26 | Disposition: A | Discharge status: Home / Self Care | Payer: 59 | Source: Ambulatory Visit | Attending: Interventional Cardiology | Admitting: Interventional Cardiology

## 2015-11-26 DIAGNOSIS — I214 Non-ST elevation (NSTEMI) myocardial infarction: Secondary | ICD-10-CM | POA: Insufficient documentation

## 2015-11-26 DIAGNOSIS — Z955 Presence of coronary angioplasty implant and graft: Secondary | ICD-10-CM

## 2015-11-26 NOTE — Progress Notes (Signed)
Daily Session Note  Patient Details  Name: Jennifer Cervantes MRN: [id number] Date of Birth: Dec 05, 1980 Referring Provider:   Flowsheet Row CARDIAC REHAB PHASE II EXERCISE from 10/15/2015 in [place]  Referring Provider  Dr. Tamala Julian       Encounter Date: 11/26/2015  Check In:     Session Check In - 11/26/15 0815      Check-In   Location [facility]   Staff Present Russella Dar, MS, EP, [facility], Exercise Physiologist;Kimiyo Carmicheal Wynetta Emery, RN, BSN   Supervising physician immediately available to respond to emergencies See telemetry face sheet for immediately available MD   Medication changes reported     No   Fall or balance concerns reported    No   Warm-up and Cool-down Performed as group-led instruction   Resistance Training Performed Yes   VAD Patient? No     Pain Assessment   Currently in Pain? No/denies   Pain Score 0-No pain   Multiple Pain Sites No      Capillary Blood Glucose: No results found for this or any previous visit (from the past 24 hour(s)).   Goals Met:  Independence with exercise equipment Exercise tolerated well No report of cardiac concerns or symptoms Strength training completed today  Goals Unmet:  Not Applicable  Comments: Check out 915.   Dr. Kate Sable is Medical Director for [facility] and Pulmonary Re[facility].

## 2015-11-28 ENCOUNTER — Encounter (HOSPITAL_COMMUNITY)
Admission: RE | Admit: 2015-11-28 | Discharge: 2015-11-28 | Disposition: A | Discharge status: Home / Self Care | Payer: 59 | Source: Ambulatory Visit | Attending: Interventional Cardiology | Admitting: Interventional Cardiology

## 2015-11-28 DIAGNOSIS — I214 Non-ST elevation (NSTEMI) myocardial infarction: Secondary | ICD-10-CM | POA: Diagnosis not present

## 2015-11-28 NOTE — Progress Notes (Signed)
Daily Session Note  Patient Details  Name: ALIENA GHRIST MRN: [id number] Date of Birth: March 25, 1980 Referring Provider:   Flowsheet Row CARDIAC REHAB PHASE II EXERCISE from 10/15/2015 in [place]  Referring Provider  Dr. Tamala Julian       Encounter Date: 11/28/2015  Check In:     Session Check In - 11/28/15 0815      Check-In   Location [facility]   Staff Present Aundra Dubin, RN, BSN;Diane Coad, MS, EP, [facility], Exercise Physiologist;Joas Motton Luther Parody, BS, EP, Exercise Physiologist   Supervising physician immediately available to respond to emergencies See telemetry face sheet for immediately available MD   Medication changes reported     No   Fall or balance concerns reported    No   Warm-up and Cool-down Performed as group-led instruction   Resistance Training Performed Yes   VAD Patient? No     Pain Assessment   Currently in Pain? No/denies   Pain Score 0-No pain   Multiple Pain Sites No      Capillary Blood Glucose: No results found for this or any previous visit (from the past 24 hour(s)).   Goals Met:  Independence with exercise equipment Exercise tolerated well No report of cardiac concerns or symptoms Strength training completed today  Goals Unmet:  Not Applicable  Comments: Check out 915   Dr. Kate Sable is Medical Director for [place] and Pulmonary Rehab.

## 2015-11-30 ENCOUNTER — Encounter (HOSPITAL_COMMUNITY)
Admission: RE | Admit: 2015-11-30 | Discharge: 2015-11-30 | Disposition: A | Discharge status: Home / Self Care | Payer: 59 | Source: Ambulatory Visit | Attending: Interventional Cardiology | Admitting: Interventional Cardiology

## 2015-11-30 DIAGNOSIS — I214 Non-ST elevation (NSTEMI) myocardial infarction: Secondary | ICD-10-CM

## 2015-11-30 NOTE — Progress Notes (Signed)
Daily Session Note  Patient Details  Name: Jennifer Cervantes MRN: [id number] Date of Birth: Mar 28, 1980 Referring Provider:   Flowsheet Row CARDIAC REHAB PHASE II EXERCISE from 10/15/2015 in [place]  Referring Provider  Dr. Tamala Julian       Encounter Date: 11/30/2015  Check In:     Session Check In - 11/30/15 0815      Check-In   Location [facility]   Staff Present Aundra Dubin, RN, BSN;Kailand Seda Luther Parody, BS, EP, Exercise Physiologist   Supervising physician immediately available to respond to emergencies See telemetry face sheet for immediately available MD   Medication changes reported     No   Fall or balance concerns reported    No   Warm-up and Cool-down Performed as group-led instruction   Resistance Training Performed Yes   VAD Patient? No     Pain Assessment   Currently in Pain? No/denies   Pain Score 0-No pain   Multiple Pain Sites No      Capillary Blood Glucose: No results found for this or any previous visit (from the past 24 hour(s)).   Goals Met:  Independence with exercise equipment Exercise tolerated well No report of cardiac concerns or symptoms Strength training completed today  Goals Unmet:  Not Applicable  Comments: Check out 915   Dr. Kate Sable is Medical Director for [place] and Pulmonary Rehab.

## 2015-12-03 ENCOUNTER — Encounter (HOSPITAL_COMMUNITY)
Admission: RE | Admit: 2015-12-03 | Discharge: 2015-12-03 | Disposition: A | Discharge status: Home / Self Care | Payer: 59 | Source: Ambulatory Visit | Attending: Interventional Cardiology | Admitting: Interventional Cardiology

## 2015-12-03 DIAGNOSIS — I214 Non-ST elevation (NSTEMI) myocardial infarction: Secondary | ICD-10-CM | POA: Diagnosis not present

## 2015-12-03 NOTE — Progress Notes (Signed)
Daily Session Note  Patient Details  Name: THELDA GAGAN MRN: [id number] Date of Birth: 26-Apr-1980 Referring Provider:   Flowsheet Row CARDIAC REHAB PHASE II EXERCISE from 10/15/2015 in [place]  Referring Provider  Dr. Tamala Julian       Encounter Date: 12/03/2015  Check In:     Session Check In - 12/03/15 0815      Check-In   Location [facility]   Staff Present Russella Dar, MS, EP, [facility], Exercise Physiologist;Kambry Takacs Wynetta Emery, RN, BSN   Supervising physician immediately available to respond to emergencies See telemetry face sheet for immediately available MD   Medication changes reported     No   Fall or balance concerns reported    No   Warm-up and Cool-down Performed as group-led instruction   Resistance Training Performed Yes   VAD Patient? No     Pain Assessment   Currently in Pain? No/denies   Pain Score 0-No pain   Multiple Pain Sites No      Capillary Blood Glucose: No results found for this or any previous visit (from the past 24 hour(s)).   Goals Met:  Independence with exercise equipment Exercise tolerated well No report of cardiac concerns or symptoms Strength training completed today  Goals Unmet:  Not Applicable  Comments: Check out 0915.   Dr. Kate Sable is Medical Director for [facility] Card[facility] and Pulmonary Rehab.

## 2015-12-05 ENCOUNTER — Encounter (HOSPITAL_COMMUNITY): Payer: 59

## 2015-12-05 NOTE — Progress Notes (Signed)
Cardiac Individual Treatment Plan  Patient Details  Name: Jennifer Cervantes MRN: [id number] Date of Birth: 1980/07/15 Referring Provider:   Flowsheet Row CARDIAC REHAB PHASE II EXERCISE from 10/15/2015 in [place]  Referring Provider  Dr. Tamala Julian       Initial Encounter Date:  Flowsheet Row CARDIAC REHAB PHASE II EXERCISE from 10/15/2015 in [place]  Date  10/15/15  Referring Provider  Dr. Tamala Julian       Visit Diagnosis: NSTEMI (non-ST elevated myocardial infarction) [facility])  Patient's Home Medications on Admission:  Current Outpatient Prescriptions:  .  acetaminophen (TYLENOL) 500 MG tablet, Take 500 mg by mouth every 6 (six) hours as needed for headache (pain)., Disp: , Rfl:  .  albuterol (PROVENTIL HFA;VENTOLIN HFA) 108 (90 Base) MCG/ACT inhaler, Inhale 2 puffs into the lungs every 6 (six) hours as needed for wheezing or shortness of breath., Disp: 1 Inhaler, Rfl: 0 .  aspirin 81 MG EC tablet, Take 1 tablet (81 mg total) by mouth daily., Disp: 30 tablet, Rfl:  .  budesonide-formoterol (SYMBICORT) 160-4.5 MCG/ACT inhaler, Inhale 2 puffs into the lungs 2 (two) times daily., Disp: 1 Inhaler, Rfl: 1 .  Cholecalciferol (VITAMIN D3) 50000 units CAPS, Take 1 capsule by mouth once a week. (Patient taking differently: Take 1 capsule by mouth every Friday. ), Disp: 12 capsule, Rfl: 1 .  clopidogrel (PLAVIX) 75 MG tablet, Take 1 tablet (75 mg total) by mouth daily., Disp: 30 tablet, Rfl: 11 .  ferrous gluconate (FERGON) 324 MG tablet, Take 1 tablet (324 mg total) by mouth daily with breakfast., Disp: 90 tablet, Rfl: 1 .  gabapentin (NEURONTIN) 300 MG capsule, Take 1 capsule (300 mg total) by mouth 2 (two) times daily., Disp: 60 capsule, Rfl: 2 .  Insulin Glargine (LANTUS SOLOSTAR) 100 UNIT/ML Solostar Pen, Inject 10 Units into the skin daily at 10 pm., Disp: 5 pen, Rfl: 2 .  Insulin Pen Needle (PEN NEEDLES) 32G X 4 MM MISC, 1 each by Does not apply  route daily. Use with Lantus pen, Disp: 100 each, Rfl: 5 .  magnesium gluconate (MAGONATE) 30 MG tablet, Take 1 tablet (30 mg total) by mouth daily., Disp: 30 tablet, Rfl: 2 .  metFORMIN (GLUCOPHAGE) 1000 MG tablet, Take 1 tablet (1,000 mg total) by mouth 2 (two) times daily with a meal., Disp: 180 tablet, Rfl: 1 .  nicotine (NICODERM CQ - DOSED IN MG/24 HOURS) 21 mg/24hr patch, Place 1 patch (21 mg total) onto the skin daily., Disp: 28 patch, Rfl: 0 .  nitroGLYCERIN (NITROSTAT) 0.4 MG SL tablet, Place 1 tablet (0.4 mg total) under the tongue every 5 (five) minutes as needed for chest pain., Disp: 25 tablet, Rfl: 3 .  rosuvastatin (CRESTOR) 5 MG tablet, Take 1 tablet (5 mg total) by mouth daily., Disp: 90 tablet, Rfl: 3  Past Medical History: Past Medical History:  Diagnosis Date  . CAD (coronary artery disease)    a. LHC on 06/21/15 which showed 75% occl mid-dist LCx, 40% occl mRCA, 99% mid-dist LAD s/p DES and 50% occl mLAD s/p DES (overlapping stents) and significant LV dysfunction with anteroapical HK; b. 06/2015 Myoview: EF 55-65%, no ischemia/infarct.  . Diabetes mellitus without complication ([place])   . Hyperlipidemia   . Hypertension   . Myocardial stunning (HCC)    a. EF 45% on 2D ECHO on 06/21/15 and severe LV dysfunction on LV gram by cath. Repeat 2D ECHO with EF 55-60% and mild HK of the apicoseptal  myocardium.  [name] Neuropraxia of right median nerve    a. suspected after right radial cath. will follow with neuro outpatient if does not resolve.   . NSTEMI (non-ST elevated myocardial infarction) [facility]) 05/2015   [facility]  . Obesity   . 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 05/2015 Following PCI (radial access)-->conservatively managed.  . Tobacco abuse   . Tricuspid regurgitation   . Type II diabetes mellitus ([place])    a. Type II    Tobacco Use: History  Smoking Status  . Current Every Day Smoker  . Packs/day: 0.50  . Years: 23.00  . Types: Cigarettes  . Last attempt to  quit: 06/21/2015  Smokeless Tobacco  . Never Used    Labs: Recent Review Flowsheet Data    Labs for ITP Cardiac and Pulmonary Rehab Latest Ref Rng & Units 06/22/2015 10/05/2015 11/22/2015   Cholestrol 125 - 200 mg/dL 152 - 159   LDLCALC <130 mg/dL 95 - 103   HDL >=46 mg/dL 30(L) - 44(L)   Trlycerides <150 mg/dL 137 - 58   Hemoglobin A1c <5.7 % 9.1(H) 5.5 -      Capillary Blood Glucose: Lab Results  Component Value Date   GLUCAP 78 07/24/2015   GLUCAP 120 (H) 07/24/2015   GLUCAP 140 (H) 07/24/2015   GLUCAP 169 (H) 07/23/2015   GLUCAP 86 07/23/2015     Exercise Target Goals:    Exercise Program Goal: Individual exercise prescription set with THRR, safety & activity barriers. Participant demonstrates ability to understand and report RPE using BORG scale, to self-measure pulse accurately, and to acknowledge the importance of the exercise prescription.  Exercise Prescription Goal: Starting with aerobic activity 30 plus minutes a day, 3 days per week for initial exercise prescription. Provide home exercise prescription and guidelines that participant acknowledges understanding prior to discharge.  Activity Barriers & Risk Stratification:     Activity Barriers & Cardiac Risk Stratification - 10/15/15 1421      Activity Barriers & Cardiac Risk Stratification   Activity Barriers None   Cardiac Risk Stratification High      6 Minute Walk:     6 Minute Walk    Row Name 10/17/15 1418         6 Minute Walk   Phase Initial     Distance 1400 feet     Walk Time 6 minutes     # of Rest Breaks 0     MPH 2.65     METS 3.03     RPE 9     Perceived Dyspnea  13     VO2 Peak 17.36     Symptoms No     Resting HR 83 bpm     Resting BP 130/80     Max Ex. HR 122 bpm     Max Ex. BP 152/86     2 Minute Post BP 136/82        Initial Exercise Prescription:     Initial Exercise Prescription - 10/17/15 1400      Date of Initial Exercise RX and Referring Provider   Date  10/15/15   Referring Provider Dr. Tamala Julian      Treadmill   MPH 1.3   Grade 0   Minutes 15   METs 1.9     Recumbant Elliptical   Level 1   RPM 57   Watts 73   Minutes 20   METs 4.3     Prescription Details  Frequency (times per week) 3   Duration Progress to 30 minutes of continuous aerobic without signs/symptoms of physical distress     Intensity   THRR REST +  30   THRR 40-80% of Max Heartrate [phone]   Ratings of Perceived Exertion 11-13   Perceived Dyspnea 0-4     Progression   Progression Continue progressive overload as per policy without signs/symptoms or physical distress.     Resistance Training   Training Prescription Yes   Weight 1   Reps 10-12      Perform Capillary Blood Glucose checks as needed.  Exercise Prescription Changes:      Exercise Prescription Changes    Row Name 11/06/15 1500 12/04/15 0800           Exercise Review   Progression Yes Yes        Response to Exercise   Blood Pressure (Admit) 108/62 120/86      Blood Pressure (Exercise) 140/74 128/70      Blood Pressure (Exit) 118/62 118/78      Heart Rate (Admit) 105 bpm 94 bpm      Heart Rate (Exercise) 134 bpm 102 bpm      Heart Rate (Exit) 99 bpm 91 bpm      Rating of Perceived Exertion (Exercise) 6 9      Duration Progress to 30 minutes of continuous aerobic without signs/symptoms of physical distress Progress to 30 minutes of continuous aerobic without signs/symptoms of physical distress      Intensity Rest + 30 Rest + 30        Progression   Progression Continue progressive overload as per policy without signs/symptoms or physical distress. Continue progressive overload as per policy without signs/symptoms or physical distress.        Resistance Training   Training Prescription Yes Yes      Weight 2 5      Reps 10-12 10-12        Treadmill   MPH 1.8 2.6      Grade 0 0      Minutes 15 20      METs 2.3 1.7        Recumbant Elliptical   Level 2 2      RPM 53 29       Watts 72 39      Minutes 20 15      METs 3.8 1.7        Home Exercise Plan   Plans to continue exercise at [place] 2 additional days to program exercise sessions. Add 2 additional days to program exercise sessions.         Exercise Comments:      Exercise Comments    Row Name 11/06/15 1538 12/04/15 0811         Exercise Comments Patient is proggressing appropriately Patient is progressing well           Discharge Exercise Prescription (Final Exercise Prescription Changes):     Exercise Prescription Changes - 12/04/15 0800      Exercise Review   Progression Yes     Response to Exercise   Blood Pressure (Admit) 120/86   Blood Pressure (Exercise) 128/70   Blood Pressure (Exit) 118/78   Heart Rate (Admit) 94 bpm   Heart Rate (Exercise) 102 bpm   Heart Rate (Exit) 91 bpm   Rating of Perceived Exertion (Exercise) 9   Duration Progress to 30 minutes of  continuous aerobic without signs/symptoms of physical distress   Intensity Rest + 30     Progression   Progression Continue progressive overload as per policy without signs/symptoms or physical distress.     Resistance Training   Training Prescription Yes   Weight 5   Reps 10-12     Treadmill   MPH 2.6   Grade 0   Minutes 20   METs 1.7     Recumbant Elliptical   Level 2   RPM 29   Watts 39   Minutes 15   METs 1.7     Home Exercise Plan   Plans to continue exercise at Home   Frequency Add 2 additional days to program exercise sessions.      Nutrition:  Target Goals: Understanding of nutrition guidelines, daily intake of sodium 1500mg , cholesterol 200mg , calories 30% from fat and 7% or less from saturated fats, daily to have 5 or more servings of fruits and vegetables.  Biometrics:     Pre Biometrics - 10/17/15 1421      Pre Biometrics   Height 5\' 9"  (1.753 m)   Weight 231 lb 4.2 oz (104.9 kg)   Waist Circumference 41.5 inches   Hip Circumference 45.5 inches   Waist to  Hip Ratio 0.91 %   BMI (Calculated) 34.2   Triceps Skinfold 22 mm   % Body Fat 40.1 %   Grip Strength 37.3 kg   Flexibility 15.41 in   Single Leg Stand 60 seconds       Nutrition Therapy Plan and Nutrition Goals:   Nutrition Discharge: Rate Your Plate Scores:     Nutrition Assessments - 10/15/15 1427      MEDFICTS Scores   Pre Score 68      Nutrition Goals Re-Evaluation:   Psychosocial: Target Goals: Acknowledge presence or absence of depression, maximize coping skills, provide positive support system. Participant is able to verbalize types and ability to use techniques and skills needed for reducing stress and depression.  Initial Review & Psychosocial Screening:     Initial Psych Review & Screening - 10/15/15 1449      Initial Review   Current issues with Current Anxiety/Panic;Current Stress Concerns   Source of Stress Concerns Occupation   Comments Patient has some family concerns causing her stress.      Family Dynamics   Good Support System? Yes     Barriers   Psychosocial barriers to participate in program The patient should benefit from training in stress management and relaxation.  There is fear and anxiety around ;patient having another event.      Screening Interventions   Interventions Encouraged to exercise      Quality of Life Scores:     Quality of Life - 10/17/15 1422      Quality of Life Scores   Health/Function Pre 8.87 %   Socioeconomic Pre 16.8 %   Psych/Spiritual Pre 18.86 %   Family Pre 28.5 %   GLOBAL Pre 14.94 %      PHQ-9: Recent Review Flowsheet Data    Depression screen [facility] 2/9 10/15/2015 10/08/2015 07/25/2015   Decreased Interest 0 2 3   Down, Depressed, Hopeless 1 1 2    PHQ - 2 Score 1 3 5    Altered sleeping 0 0 3   Tired, decreased energy 3 3 3    Change in appetite 1 1 1    Feeling bad or failure about yourself  0 0 3   Trouble concentrating 0 0 0  Moving slowly or fidgety/restless 1 1 0   Suicidal thoughts 0 0 0    PHQ-9 Score 6 8 15    Difficult doing work/chores - - Not difficult at all      Psychosocial Evaluation and Intervention:     Psychosocial Evaluation - 10/15/15 1453      Psychosocial Evaluation & Interventions   Interventions Stress management education;Relaxation education;Encouraged to exercise with the program and follow exercise prescription   Comments Patient's PHQ-9 score was a 6. Patient's overal QOL score was 14.94 which indicates some depression. Patient says she is a little depressed but more affraid that something more will happen to her concerning her heart.   Continued Psychosocial Services Needed No  Patient does have some anxiety issues related to her recent cardiac event. She also has some stress on her job and some family stress. She does not want counseling.       Psychosocial Re-Evaluation:     Psychosocial Re-Evaluation    [place] Name 11/05/15 1424 12/05/15 1445           Psychosocial Re-Evaluation   Interventions Encouraged to attend Cardiac Rehabilitation for the exercise Encouraged to attend Cardiac Rehabilitation for the exercise      Comments Patient's QOL score was 14.94 and her PHQ-9 score was 6. Patient has some anxiety and depression issues but she feels she does not need conunseling.  Patient's QOL score was 14.94 and her PHQ-9 score was 6. Patient has some anxiety and depression issues but she feels she does not need conunseling.       Continued Psychosocial Services Needed  - No         Vocational Rehabilitation: Provide vocational rehab assistance to qualifying candidates.   Vocational Rehab Evaluation & Intervention:     Vocational Rehab - 10/15/15 1427      Initial Vocational Rehab Evaluation & Intervention   Assessment shows need for Vocational Rehabilitation No      Education: Education Goals: Education classes will be provided on a weekly basis, covering required topics. Participant will state understanding/return demonstration of  topics presented.  Learning Barriers/Preferences:     Learning Barriers/Preferences - 10/15/15 1422      Learning Barriers/Preferences   Learning Barriers None   Learning Preferences Verbal Instruction      Education Topics: Hypertension, Hypertension Reduction -Define heart disease and high blood pressure. Discus how high blood pressure affects the body and ways to reduce high blood pressure.   Exercise and Your Heart -Discuss why it is important to exercise, the FITT principles of exercise, normal and abnormal responses to exercise, and how to exercise safely.   Angina -Discuss definition of angina, causes of angina, treatment of angina, and how to decrease risk of having angina.   Cardiac Medications -Review what the following cardiac medications are used for, how they affect the body, and side effects that may occur when taking the medications.  Medications include Aspirin, Beta blockers, calcium channel blockers, ACE Inhibitors, angiotensin receptor blockers, diuretics, digoxin, and antihyperlipidemics.   Congestive Heart Failure -Discuss the definition of CHF, how to live with CHF, the signs and symptoms of CHF, and how keep track of weight and sodium intake.   Heart Disease and Intimacy -Discus the effect sexual activity has on the heart, how changes occur during intimacy as we age, and safety during sexual activity. Flowsheet Row CARDIAC REHAB PHASE II EXERCISE from 11/28/2015 in [place]  Date  10/31/15  Educator  DJ  Instruction  Review Code  2- meets goals/outcomes      Smoking Cessation / COPD -Discuss different methods to quit smoking, the health benefits of quitting smoking, and the definition of COPD. Flowsheet Row CARDIAC REHAB PHASE II EXERCISE from 11/28/2015 in [place]  Date  11/07/15  Educator  DC  Instruction Review Code  2- meets goals/outcomes      Nutrition I: Fats -Discuss the types of  cholesterol, what cholesterol does to the heart, and how cholesterol levels can be controlled. Flowsheet Row CARDIAC REHAB PHASE II EXERCISE from 11/28/2015 in [place]  Date  11/14/15  Educator  Russella Dar  Instruction Review Code  2- meets goals/outcomes      Nutrition II: Labels -Discuss the different components of food labels and how to read food label [place] from 11/28/2015 in [place]  Date  11/21/15  Educator  Russella Dar  Instruction Review Code  2- meets goals/outcomes      Heart Parts and Heart Disease -Discuss the anatomy of the heart, the pathway of blood circulation through the heart, and these are affected by heart disease. Flowsheet Row CARDIAC REHAB PHASE II EXERCISE from 11/28/2015 in [place]  Date  11/28/15  Educator  DC  Instruction Review Code  2- meets goals/outcomes      Stress I: Signs and Symptoms -Discuss the causes of stress, how stress may lead to anxiety and depression, and ways to limit stress.   Stress II: Relaxation -Discuss different types of relaxation techniques to limit stress.   Warning Signs of Stroke / TIA -Discuss definition of a stroke, what the signs and symptoms are of a stroke, and how to identify when someone is having stroke.   Knowledge Questionnaire Score:     Knowledge Questionnaire Score - 10/15/15 1423      Knowledge Questionnaire Score   Pre Score 23/28      Core Components/Risk Factors/Patient Goals at Admission:     Personal Goals and Risk Factors at Admission - 10/15/15 1428      Core Components/Risk Factors/Patient Goals on Admission    Weight Management Yes   Admit Weight 231 lb (104.8 kg)   Goal Weight: Short Term 226 lb (102.5 kg)   Goal Weight: Long Term 216 lb (98 kg)   Expected Outcomes Short Term: Continue to assess and modify interventions until short term weight is achieved;Long Term:  Adherence to nutrition and physical activity/exercise program aimed toward attainment of established weight goal   Sedentary Yes   Intervention Provide advice, education, support and counseling about physical activity/exercise needs.;Develop an individualized exercise prescription for aerobic and resistive training based on initial evaluation findings, risk stratification, comorbidities and participant's personal goals.   Expected Outcomes Achievement of increased cardiorespiratory fitness and enhanced flexibility, muscular endurance and strength shown through measurements of functional capacity and personal statement of participant.   Increase Strength and Stamina Yes   Intervention Provide advice, education, support and counseling about physical activity/exercise needs.;Develop an individualized exercise prescription for aerobic and resistive training based on initial evaluation findings, risk stratification, comorbidities and participant's personal goals.   Expected Outcomes Achievement of increased cardiorespiratory fitness and enhanced flexibility, muscular endurance and strength shown through measurements of functional capacity and personal statement of participant.   Tobacco Cessation Yes   Intervention Advice worker, assist with locating and accessing local/national Quit Smoking programs, and support quit date choice.   Expected Outcomes  Short Term: Will demonstrate readiness to quit, by selecting a quit date.;Long Term: Complete abstinence from all tobacco products for at least 12 months from quit date.   Improve shortness of breath with ADL's Yes   Intervention Provide education, individualized exercise plan and daily activity instruction to help decrease symptoms of SOB with activities of daily living.   Expected Outcomes Short Term: Achieves a reduction of symptoms when performing activities of daily living.   Diabetes Yes   Intervention Provide education about  signs/symptoms and action to take for hypo/hyperglycemia.;Provide education about proper nutrition, including hydration, and aerobic/resistive exercise prescription along with prescribed medications to achieve blood glucose in normal ranges: Fasting glucose 65-99 mg/dL   Expected Outcomes Short Term: Participant verbalizes understanding of the signs/symptoms and immediate care of hyper/hypoglycemia, proper foot care and importance of medication, aerobic/resistive exercise and nutrition plan for blood glucose control.   Stress Yes   Intervention Offer individual and/or small group education and counseling on adjustment to heart disease, stress management and health-related lifestyle change. Teach and support self-help strategies.   Expected Outcomes Short Term: Participant demonstrates changes in health-related behavior, relaxation and other stress management skills, ability to obtain effective social support, and compliance with psychotropic medications if prescribed.   Personal Goal Other Yes   Personal Goal Do what I use to do without being afraid.       Core Components/Risk Factors/Patient Goals Review:      Goals and Risk Factor Review    Row Name 11/09/15 1420 12/05/15 1444           Core Components/Risk Factors/Patient Goals Review   Personal Goals Review Weight Management/Obesity;Increase Strength and Stamina;Improve shortness of breath with ADL's;Other;Tobacco Cessation  Do what she used to do without being afraid.  Weight Management/Obesity;Tobacco Cessation;Increase Strength and Stamina;Improve shortness of breath with ADL's;Other  Patient will continue to do what she used to do and not be afraid.       Review After 4 sessions, patient has lost 1.1 lbs.  Patient says it is too soon to tell if program has improved her SOB or strength.  After 13 sessions, patient has lost 3.7 lbs. Her strength and stamina are improving with improved SOB. She is progressing well in the program. She  continues to smoke.       Expected Outcomes Patient will continue to attend program with improved SOB, increased strength and stamina, weight loss and tobacco cessation.  Patient will continue to attend program with improved SOB, increased strength and stamina, weight loss and tobacco cessation.         Core Components/Risk Factors/Patient Goals at Discharge (Final Review):      Goals and Risk Factor Review - 12/05/15 1444      Core Components/Risk Factors/Patient Goals Review   Personal Goals Review Weight Management/Obesity;Tobacco Cessation;Increase Strength and Stamina;Improve shortness of breath with ADL's;Other  Patient will continue to do what she used to do and not be afraid.    Review After 13 sessions, patient has lost 3.7 lbs. Her strength and stamina are improving with improved SOB. She is progressing well in the program. She continues to smoke.    Expected Outcomes Patient will continue to attend program with improved SOB, increased strength and stamina, weight loss and tobacco cessation.      ITP Comments:   Comments: 30 Day Review: Patient doing well with program. Will continue to monitor for progress.

## 2015-12-07 ENCOUNTER — Encounter (HOSPITAL_COMMUNITY): Payer: 59

## 2015-12-10 ENCOUNTER — Encounter (HOSPITAL_COMMUNITY): Payer: 59

## 2015-12-12 ENCOUNTER — Encounter (HOSPITAL_COMMUNITY): Payer: 59

## 2015-12-14 ENCOUNTER — Other Ambulatory Visit: Payer: Self-pay | Admitting: Medical

## 2015-12-14 ENCOUNTER — Telehealth: Payer: Self-pay | Admitting: Medical

## 2015-12-14 ENCOUNTER — Encounter (HOSPITAL_COMMUNITY): Payer: 59

## 2015-12-14 DIAGNOSIS — R0602 Shortness of breath: Secondary | ICD-10-CM

## 2015-12-14 MED ORDER — FUROSEMIDE 20 MG PO TABS: 20.0000 mg | ORAL_TABLET | Freq: Every day | ORAL | 3 refills | Status: DC

## 2015-12-14 MED FILL — FUROSEMIDE 20 MG TABLET: 20 | 30 days supply | Qty: 30 | Fill #0

## 2015-12-14 NOTE — Telephone Encounter (Signed)
Rcvd refill request for Furosemide 20mg  #30

## 2015-12-17 ENCOUNTER — Encounter (HOSPITAL_COMMUNITY): Payer: 59

## 2015-12-17 ENCOUNTER — Other Ambulatory Visit: Payer: Self-pay | Admitting: *Deleted

## 2015-12-17 ENCOUNTER — Encounter: Payer: Self-pay | Admitting: *Deleted

## 2015-12-17 NOTE — Patient Outreach (Addendum)
Attempted to reach [facility] via her mobile number to arrange [facility] To [facility] follow up. Unable to leave message as the recording states she has not set up a voice mail. Secure e-mail sent to [facility] via her [facility] e-mail address. Will also compose and mail letter to her home address requesting she make a follow up appointment as soon as possible. Barrington Ellison RN,CCM,CDE [place] Management Coordinator Link To Wellness Office Phone [phone] Office Fax [phone]

## 2015-12-19 ENCOUNTER — Telehealth: Payer: Self-pay | Admitting: Medical

## 2015-12-19 ENCOUNTER — Encounter (HOSPITAL_COMMUNITY): Payer: 59

## 2015-12-19 MED ORDER — METFORMIN HCL 1000 MG PO TABS: 1000.0000 mg | ORAL_TABLET | Freq: Two times a day (BID) | ORAL | 0 refills | Status: DC

## 2015-12-19 MED FILL — CLOPIDOGREL 75 MG TABLET: 75 | 30 days supply | Qty: 30 | Fill #2

## 2015-12-19 MED FILL — metFORMIN HCL 1000 MG TABS: 1000 | 90 days supply | Qty: 180 | Fill #0

## 2015-12-19 NOTE — Telephone Encounter (Signed)
Recv'd fax request from [place] for Metformin 1000 mg

## 2015-12-19 NOTE — Telephone Encounter (Signed)
reflled med for 90 days. Last a1c was late august

## 2015-12-21 ENCOUNTER — Encounter (HOSPITAL_COMMUNITY): Payer: 59

## 2015-12-24 ENCOUNTER — Encounter (HOSPITAL_COMMUNITY): Payer: 59

## 2015-12-26 ENCOUNTER — Encounter (HOSPITAL_COMMUNITY): Payer: 59

## 2015-12-27 NOTE — Progress Notes (Signed)
Discharge Summary  Patient Details  Name: Jennifer Cervantes MRN: [id number] Date of Birth: 1980-12-29 Referring Provider:   Flowsheet Row CARDIAC REHAB PHASE II EXERCISE from 10/15/2015 [facility]  Referring Provider  Dr. Tamala Julian        Number of Visits: 13  Reason for Discharge:  Early Exit:  Patient stopped after 13 visits d/t personal and family issues.   Smoking History:  History  Smoking Status  . Current Every Day Smoker  . Packs/day: 0.50  . Years: 23.00  . Types: Cigarettes  . Last attempt to quit: 06/21/2015  Smokeless Tobacco  . Never Used    Diagnosis:  NSTEMI (non-ST elevated myocardial infarction) ([place])  ADL UCSD:   Initial Exercise Prescription:     Initial Exercise Prescription - 10/17/15 1400      Date of Initial Exercise RX and Referring Provider   Date 10/15/15   Referring Provider Dr. Tamala Julian      Treadmill   MPH 1.3   Grade 0   Minutes 15   METs 1.9     Recumbant Elliptical   Level 1   RPM 57   Watts 73   Minutes 20   METs 4.3     Prescription Details   Frequency (times per week) 3   Duration Progress to 30 minutes of continuous aerobic without signs/symptoms of physical distress     Intensity   THRR REST +  30   THRR 40-80% of Max Heartrate [phone]   Ratings of Perceived Exertion 11-13   Perceived Dyspnea 0-4     Progression   Progression Continue progressive overload as per policy without signs/symptoms or physical distress.     Resistance Training   Training Prescription Yes   Weight 1   Reps 10-12      Discharge Exercise Prescription (Final Exercise Prescription Changes):     Exercise Prescription Changes - 12/04/15 0800      Exercise Review   Progression Yes     Response to Exercise   Blood Pressure (Admit) 120/86   Blood Pressure (Exercise) 128/70   Blood Pressure (Exit) 118/78   Heart Rate (Admit) 94 bpm   Heart Rate (Exercise) 102 bpm   Heart Rate (Exit) 91 bpm   Rating of  Perceived Exertion (Exercise) 9   Duration Progress to 30 minutes of continuous aerobic without signs/symptoms of physical distress   Intensity Rest + 30     Progression   Progression Continue progressive overload as per policy without signs/symptoms or physical distress.     Resistance Training   Training Prescription Yes   Weight 5   Reps 10-12     Treadmill   MPH 2.6   Grade 0   Minutes 20   METs 1.7     Recumbant Elliptical   Level 2   RPM 29   Watts 39   Minutes 15   METs 1.7     Home Exercise Plan   Plans to continue exercise at Home   Frequency Add 2 additional days to program exercise sessions.      Functional Capacity:     6 Minute Walk    Row Name 10/17/15 1418         6 Minute Walk   Phase Initial     Distance 1400 feet     Walk Time 6 minutes     # of Rest Breaks 0     MPH 2.65     METS 3.03  RPE 9     Perceived Dyspnea  13     VO2 Peak 17.36     Symptoms No     Resting HR 83 bpm     Resting BP 130/80     Max Ex. HR 122 bpm     Max Ex. BP 152/86     2 Minute Post BP 136/82        Psychological, QOL, Others - Outcomes: PHQ 2/9: Depression screen [facility] 2/9 10/15/2015 10/08/2015 07/25/2015  Decreased Interest 0 2 3  Down, Depressed, Hopeless 1 1 2   PHQ - 2 Score 1 3 5   Altered sleeping 0 0 3  Tired, decreased energy 3 3 3   Change in appetite 1 1 1   Feeling bad or failure about yourself  0 0 3  Trouble concentrating 0 0 0  Moving slowly or fidgety/restless 1 1 0  Suicidal thoughts 0 0 0  PHQ-9 Score 6 8 15   Difficult doing work/chores - - Not difficult at all    Quality of Life:     Quality of Life - 10/17/15 1422      Quality of Life Scores   Health/Function Pre 8.87 %   Socioeconomic Pre 16.8 %   Psych/Spiritual Pre 18.86 %   Family Pre 28.5 %   GLOBAL Pre 14.94 %      Personal Goals: Goals established at orientation with interventions provided to work toward goal.     Personal Goals and Risk Factors at Admission -  10/15/15 1428      Core Components/Risk Factors/Patient Goals on Admission    Weight Management Yes   Admit Weight 231 lb (104.8 kg)   Goal Weight: Short Term 226 lb (102.5 kg)   Goal Weight: Long Term 216 lb (98 kg)   Expected Outcomes Short Term: Continue to assess and modify interventions until short term weight is achieved;Long Term: Adherence to nutrition and physical activity/exercise program aimed toward attainment of established weight goal   Sedentary Yes   Intervention Provide advice, education, support and counseling about physical activity/exercise needs.;Develop an individualized exercise prescription for aerobic and resistive training based on initial evaluation findings, risk stratification, comorbidities and participant's personal goals.   Expected Outcomes Achievement of increased cardiorespiratory fitness and enhanced flexibility, muscular endurance and strength shown through measurements of functional capacity and personal statement of participant.   Increase Strength and Stamina Yes   Intervention Provide advice, education, support and counseling about physical activity/exercise needs.;Develop an individualized exercise prescription for aerobic and resistive training based on initial evaluation findings, risk stratification, comorbidities and participant's personal goals.   Expected Outcomes Achievement of increased cardiorespiratory fitness and enhanced flexibility, muscular endurance and strength shown through measurements of functional capacity and personal statement of participant.   Tobacco Cessation Yes   Intervention Advice worker, assist with locating and accessing local/national Quit Smoking programs, and support quit date choice.   Expected Outcomes Short Term: Will demonstrate readiness to quit, by selecting a quit date.;Long Term: Complete abstinence from all tobacco products for at least 12 months from quit date.   Improve shortness of breath with  ADL's Yes   Intervention Provide education, individualized exercise plan and daily activity instruction to help decrease symptoms of SOB with activities of daily living.   Expected Outcomes Short Term: Achieves a reduction of symptoms when performing activities of daily living.   Diabetes Yes   Intervention Provide education about signs/symptoms and action to take for hypo/hyperglycemia.;Provide education about proper nutrition, including  hydration, and aerobic/resistive exercise prescription along with prescribed medications to achieve blood glucose in normal ranges: Fasting glucose 65-99 mg/dL   Expected Outcomes Short Term: Participant verbalizes understanding of the signs/symptoms and immediate care of hyper/hypoglycemia, proper foot care and importance of medication, aerobic/resistive exercise and nutrition plan for blood glucose control.   Stress Yes   Intervention Offer individual and/or small group education and counseling on adjustment to heart disease, stress management and health-related lifestyle change. Teach and support self-help strategies.   Expected Outcomes Short Term: Participant demonstrates changes in health-related behavior, relaxation and other stress management skills, ability to obtain effective social support, and compliance with psychotropic medications if prescribed.   Personal Goal Other Yes   Personal Goal Do what I use to do without being afraid.        Personal Goals Discharge:     Goals and Risk Factor Review    Row Name 11/09/15 1420 12/05/15 1444           Core Components/Risk Factors/Patient Goals Review   Personal Goals Review Weight Management/Obesity;Increase Strength and Stamina;Improve shortness of breath with ADL's;Other;Tobacco Cessation  Do what she used to do without being afraid.  Weight Management/Obesity;Tobacco Cessation;Increase Strength and Stamina;Improve shortness of breath with ADL's;Other  Patient will continue to do what she used to do  and not be afraid.       Review After 4 sessions, patient has lost 1.1 lbs.  Patient says it is too soon to tell if program has improved her SOB or strength.  After 13 sessions, patient has lost 3.7 lbs. Her strength and stamina are improving with improved SOB. She is progressing well in the program. She continues to smoke.       Expected Outcomes Patient will continue to attend program with improved SOB, increased strength and stamina, weight loss and tobacco cessation.  Patient will continue to attend program with improved SOB, increased strength and stamina, weight loss and tobacco cessation.         Nutrition & Weight - Outcomes:     Pre Biometrics - 10/17/15 1421      Pre Biometrics   Height 5\' 9"  (1.753 m)   Weight 231 lb 4.2 oz (104.9 kg)   Waist Circumference 41.5 inches   Hip Circumference 45.5 inches   Waist to Hip Ratio 0.91 %   BMI (Calculated) 34.2   Triceps Skinfold 22 mm   % Body Fat 40.1 %   Grip Strength 37.3 kg   Flexibility 15.41 in   Single Leg Stand 60 seconds       Nutrition:   Nutrition Discharge:     Nutrition Assessments - 10/15/15 1427      MEDFICTS Scores   Pre Score 68      Education Questionnaire Score:     Knowledge Questionnaire Score - 10/15/15 1423      Knowledge Questionnaire Score   Pre Score 23/28

## 2015-12-28 ENCOUNTER — Encounter (HOSPITAL_COMMUNITY): Payer: 59

## 2015-12-31 ENCOUNTER — Encounter (HOSPITAL_COMMUNITY): Payer: 59

## 2016-01-02 ENCOUNTER — Encounter (HOSPITAL_COMMUNITY): Payer: 59

## 2016-01-04 ENCOUNTER — Encounter (HOSPITAL_COMMUNITY): Payer: 59

## 2016-01-07 ENCOUNTER — Encounter (HOSPITAL_COMMUNITY): Payer: 59

## 2016-01-09 ENCOUNTER — Encounter (HOSPITAL_COMMUNITY): Payer: 59

## 2016-01-11 ENCOUNTER — Encounter (HOSPITAL_COMMUNITY): Payer: 59

## 2016-01-14 ENCOUNTER — Encounter (HOSPITAL_COMMUNITY): Payer: 59

## 2016-01-16 ENCOUNTER — Encounter (HOSPITAL_COMMUNITY): Payer: 59

## 2016-01-18 ENCOUNTER — Encounter (HOSPITAL_COMMUNITY): Payer: 59

## 2016-01-21 ENCOUNTER — Encounter (HOSPITAL_COMMUNITY): Payer: 59

## 2016-01-25 ENCOUNTER — Other Ambulatory Visit: Payer: 59 | Admitting: *Deleted

## 2016-01-25 DIAGNOSIS — Z6834 Body mass index (BMI) 34.0-34.9, adult: Secondary | ICD-10-CM | POA: Diagnosis not present

## 2016-01-25 DIAGNOSIS — N644 Mastodynia: Secondary | ICD-10-CM | POA: Diagnosis not present

## 2016-01-25 DIAGNOSIS — E785 Hyperlipidemia, unspecified: Secondary | ICD-10-CM | POA: Diagnosis not present

## 2016-01-25 DIAGNOSIS — Z3202 Encounter for pregnancy test, result negative: Secondary | ICD-10-CM | POA: Diagnosis not present

## 2016-01-25 DIAGNOSIS — N926 Irregular menstruation, unspecified: Secondary | ICD-10-CM | POA: Diagnosis not present

## 2016-01-25 LAB — HEPATIC FUNCTION PANEL
ALBUMIN: 4.4 g/dL (ref 3.6–5.1)
ALT: 10 U/L (ref 6–29)
AST: 14 U/L (ref 10–30)
Alkaline Phosphatase: 72 U/L (ref 33–115)
Bilirubin, Direct: 0.1 mg/dL (ref <=0.2)
Indirect Bilirubin: 0.3 mg/dL (ref 0.2–1.2)
TOTAL PROTEIN: 7.5 g/dL (ref 6.1–8.1)
Total Bilirubin: 0.4 mg/dL (ref 0.2–1.2)

## 2016-01-25 LAB — LIPID PANEL
CHOL/HDL RATIO: 3.3 ratio (ref <5.0)
CHOLESTEROL: 140 mg/dL (ref <200)
HDL: 43 mg/dL — AB (ref >50)
LDL Cholesterol: 84 mg/dL (ref <100)
Triglycerides: 63 mg/dL (ref <150)
VLDL: 13 mg/dL (ref <30)

## 2016-01-31 ENCOUNTER — Telehealth: Payer: Self-pay | Admitting: Internal Medicine

## 2016-01-31 NOTE — Telephone Encounter (Signed)
Called pt to find out if she has had a flu shot, want flu or declines flu shot

## 2016-02-08 DIAGNOSIS — N926 Irregular menstruation, unspecified: Secondary | ICD-10-CM | POA: Diagnosis not present

## 2016-02-08 DIAGNOSIS — Z6834 Body mass index (BMI) 34.0-34.9, adult: Secondary | ICD-10-CM | POA: Diagnosis not present

## 2016-02-08 DIAGNOSIS — N838 Other noninflammatory disorders of ovary, fallopian tube and broad ligament: Secondary | ICD-10-CM | POA: Diagnosis not present

## 2016-02-10 MED FILL — FERROUS GLUCONATE 324 MG TA: 324 (38 FE) | 90 days supply | Qty: 90 | Fill #1

## 2016-02-10 MED FILL — CLOPIDOGREL 75 MG TABLET: 75 | 30 days supply | Qty: 30 | Fill #3

## 2016-02-20 ENCOUNTER — Ambulatory Visit: Payer: Self-pay | Admitting: *Deleted

## 2016-02-21 ENCOUNTER — Other Ambulatory Visit: Payer: Self-pay | Admitting: *Deleted

## 2016-02-21 ENCOUNTER — Encounter: Payer: Self-pay | Admitting: *Deleted

## 2016-02-21 NOTE — Patient Outreach (Signed)
Jennifer Cervantes did not keep her 8:45 am [facility] appointment yesterday 02/20/16. Called [name] on her cell phone, unable to leave a voice mail as message states "voice mail has not been set up" so will send letter to her home address advising her to make an appointment ASAP or risk termination form the [facility] program for failure to keep appointment policy. Barrington Ellison RN,CCM,CDE [place] Management Coordinator [facility] Office Phone [phone] Office Fax [phone]

## 2016-03-11 DIAGNOSIS — E119 Type 2 diabetes mellitus without complications: Secondary | ICD-10-CM | POA: Diagnosis not present

## 2016-03-11 DIAGNOSIS — Z7902 Long term (current) use of antithrombotics/antiplatelets: Secondary | ICD-10-CM | POA: Diagnosis not present

## 2016-03-11 DIAGNOSIS — I10 Essential (primary) hypertension: Secondary | ICD-10-CM | POA: Diagnosis not present

## 2016-03-11 DIAGNOSIS — I252 Old myocardial infarction: Secondary | ICD-10-CM | POA: Diagnosis not present

## 2016-03-11 DIAGNOSIS — N921 Excessive and frequent menstruation with irregular cycle: Secondary | ICD-10-CM | POA: Diagnosis not present

## 2016-03-11 DIAGNOSIS — N83201 Unspecified ovarian cyst, right side: Secondary | ICD-10-CM | POA: Diagnosis not present

## 2016-03-11 DIAGNOSIS — Z79899 Other long term (current) drug therapy: Secondary | ICD-10-CM | POA: Diagnosis not present

## 2016-03-11 DIAGNOSIS — Z955 Presence of coronary angioplasty implant and graft: Secondary | ICD-10-CM | POA: Diagnosis not present

## 2016-03-11 DIAGNOSIS — N926 Irregular menstruation, unspecified: Secondary | ICD-10-CM | POA: Diagnosis not present

## 2016-03-11 DIAGNOSIS — Z7982 Long term (current) use of aspirin: Secondary | ICD-10-CM | POA: Diagnosis not present

## 2016-03-11 DIAGNOSIS — Z7984 Long term (current) use of oral hypoglycemic drugs: Secondary | ICD-10-CM | POA: Diagnosis not present

## 2016-03-18 MED FILL — CLOPIDOGREL 75 MG TABLET: 75 | 30 days supply | Qty: 30 | Fill #4

## 2016-03-18 MED FILL — ROSUVASTATIN CALCIUM 5 MG T: 5 | 90 days supply | Qty: 90 | Fill #1

## 2016-03-18 MED FILL — FUROSEMIDE 20 MG TABLET: 20 | 30 days supply | Qty: 30 | Fill #1

## 2016-04-01 ENCOUNTER — Inpatient Hospital Stay (HOSPITAL_COMMUNITY)
Admission: EM | Admit: 2016-04-01 | Discharge: 2016-04-03 | DRG: 247 | Disposition: A | Discharge status: Home / Self Care | Payer: 59 | Attending: Cardiovascular Disease | Admitting: Cardiovascular Disease

## 2016-04-01 ENCOUNTER — Emergency Department (HOSPITAL_COMMUNITY): Payer: 59

## 2016-04-01 ENCOUNTER — Encounter (HOSPITAL_COMMUNITY): Payer: Self-pay | Admitting: Emergency Medicine

## 2016-04-01 DIAGNOSIS — I25119 Atherosclerotic heart disease of native coronary artery with unspecified angina pectoris: Secondary | ICD-10-CM | POA: Diagnosis present

## 2016-04-01 DIAGNOSIS — R0789 Other chest pain: Secondary | ICD-10-CM | POA: Diagnosis not present

## 2016-04-01 DIAGNOSIS — E1151 Type 2 diabetes mellitus with diabetic peripheral angiopathy without gangrene: Secondary | ICD-10-CM | POA: Diagnosis present

## 2016-04-01 DIAGNOSIS — I071 Rheumatic tricuspid insufficiency: Secondary | ICD-10-CM | POA: Diagnosis present

## 2016-04-01 DIAGNOSIS — Z6835 Body mass index (BMI) 35.0-35.9, adult: Secondary | ICD-10-CM

## 2016-04-01 DIAGNOSIS — R079 Chest pain, unspecified: Secondary | ICD-10-CM | POA: Diagnosis present

## 2016-04-01 DIAGNOSIS — I249 Acute ischemic heart disease, unspecified: Secondary | ICD-10-CM | POA: Diagnosis present

## 2016-04-01 DIAGNOSIS — R002 Palpitations: Secondary | ICD-10-CM | POA: Diagnosis present

## 2016-04-01 DIAGNOSIS — I252 Old myocardial infarction: Secondary | ICD-10-CM | POA: Diagnosis not present

## 2016-04-01 DIAGNOSIS — Z716 Tobacco abuse counseling: Secondary | ICD-10-CM | POA: Diagnosis not present

## 2016-04-01 DIAGNOSIS — I471 Supraventricular tachycardia: Secondary | ICD-10-CM | POA: Diagnosis present

## 2016-04-01 DIAGNOSIS — I10 Essential (primary) hypertension: Secondary | ICD-10-CM | POA: Diagnosis present

## 2016-04-01 DIAGNOSIS — Z720 Tobacco use: Secondary | ICD-10-CM | POA: Diagnosis not present

## 2016-04-01 DIAGNOSIS — E669 Obesity, unspecified: Secondary | ICD-10-CM | POA: Diagnosis present

## 2016-04-01 DIAGNOSIS — Z955 Presence of coronary angioplasty implant and graft: Secondary | ICD-10-CM | POA: Diagnosis not present

## 2016-04-01 DIAGNOSIS — F17210 Nicotine dependence, cigarettes, uncomplicated: Secondary | ICD-10-CM | POA: Diagnosis present

## 2016-04-01 DIAGNOSIS — E785 Hyperlipidemia, unspecified: Secondary | ICD-10-CM | POA: Diagnosis not present

## 2016-04-01 DIAGNOSIS — I200 Unstable angina: Secondary | ICD-10-CM | POA: Diagnosis not present

## 2016-04-01 DIAGNOSIS — I25110 Atherosclerotic heart disease of native coronary artery with unstable angina pectoris: Secondary | ICD-10-CM | POA: Diagnosis present

## 2016-04-01 DIAGNOSIS — I2510 Atherosclerotic heart disease of native coronary artery without angina pectoris: Secondary | ICD-10-CM | POA: Diagnosis not present

## 2016-04-01 LAB — CBC WITH DIFFERENTIAL/PLATELET
Basophils Absolute: 0 10*3/uL (ref 0.0–0.1)
Basophils Relative: 0 %
Eosinophils Absolute: 0.2 10*3/uL (ref 0.0–0.7)
Eosinophils Relative: 2 %
HCT: 37.1 % (ref 36.0–46.0)
Hemoglobin: 12.6 g/dL (ref 12.0–15.0)
Lymphocytes Relative: 25 %
Lymphs Abs: 2.6 10*3/uL (ref 0.7–4.0)
MCH: 30.9 pg (ref 26.0–34.0)
MCHC: 34 g/dL (ref 30.0–36.0)
MCV: 90.9 fL (ref 78.0–100.0)
Monocytes Absolute: 0.4 10*3/uL (ref 0.1–1.0)
Monocytes Relative: 4 %
Neutro Abs: 7.4 10*3/uL (ref 1.7–7.7)
Neutrophils Relative %: 69 %
Platelets: 253 10*3/uL (ref 150–400)
RBC: 4.08 MIL/uL (ref 3.87–5.11)
RDW: 13.2 % (ref 11.5–15.5)
WBC: 10.7 10*3/uL — ABNORMAL HIGH (ref 4.0–10.5)

## 2016-04-01 LAB — CBC
HEMATOCRIT: 38.8 % (ref 36.0–46.0)
HEMOGLOBIN: 12.9 g/dL (ref 12.0–15.0)
MCH: 30.4 pg (ref 26.0–34.0)
MCHC: 33.2 g/dL (ref 30.0–36.0)
MCV: 91.3 fL (ref 78.0–100.0)
Platelets: 263 10*3/uL (ref 150–400)
RBC: 4.25 MIL/uL (ref 3.87–5.11)
RDW: 13.2 % (ref 11.5–15.5)
WBC: 12.1 10*3/uL — AB (ref 4.0–10.5)

## 2016-04-01 LAB — BASIC METABOLIC PANEL
Anion gap: 10 (ref 5–15)
BUN: 15 mg/dL (ref 6–20)
CO2: 25 mmol/L (ref 22–32)
Calcium: 9.6 mg/dL (ref 8.9–10.3)
Chloride: 103 mmol/L (ref 101–111)
Creatinine, Ser: 0.97 mg/dL (ref 0.44–1.00)
GFR calc Af Amer: >60 mL/min (ref >60)
GFR calc non Af Amer: >60 mL/min (ref >60)
Glucose, Bld: 176 mg/dL — ABNORMAL HIGH (ref 65–99)
Potassium: 3.5 mmol/L (ref 3.5–5.1)
Sodium: 138 mmol/L (ref 135–145)

## 2016-04-01 LAB — TROPONIN I
Troponin I: <0.03 ng/mL (ref <0.03)
Troponin I: <0.03 ng/mL (ref <0.03)

## 2016-04-01 LAB — PROTIME-INR
INR: 0.9
Prothrombin Time: 12.1 seconds (ref 11.4–15.2)

## 2016-04-01 LAB — CREATININE, SERUM: CREATININE: 0.95 mg/dL (ref 0.44–1.00)

## 2016-04-01 LAB — GLUCOSE, CAPILLARY: Glucose-Capillary: 141 mg/dL — ABNORMAL HIGH (ref 65–99)

## 2016-04-01 MED ORDER — ACETAMINOPHEN 500 MG PO TABS: 500.0000 mg | ORAL_TABLET | Freq: Four times a day (QID) | ORAL | Status: DC | PRN

## 2016-04-01 MED ORDER — NITROGLYCERIN 0.4 MG SL SUBL: 0.4000 mg | SUBLINGUAL_TABLET | SUBLINGUAL | Status: DC | PRN

## 2016-04-01 MED ORDER — SODIUM CHLORIDE 0.9 % IV SOLN
INTRAVENOUS | Status: DC
  Administered 2016-04-01: 75 mL/h via INTRAVENOUS
  Administered 2016-04-02: 02:00:00 via INTRAVENOUS

## 2016-04-01 MED ORDER — GABAPENTIN 300 MG PO CAPS
300.0000 mg | ORAL_CAPSULE | Freq: Two times a day (BID) | ORAL | Status: DC
  Filled 2016-04-01 (×3): qty 1

## 2016-04-01 MED ORDER — ASPIRIN 300 MG RE SUPP: 300.0000 mg | RECTAL | Status: DC

## 2016-04-01 MED ORDER — ONDANSETRON HCL 4 MG/2ML IJ SOLN: 4.0000 mg | Freq: Four times a day (QID) | INTRAMUSCULAR | Status: DC | PRN

## 2016-04-01 MED ORDER — ACETAMINOPHEN 325 MG PO TABS
650.0000 mg | ORAL_TABLET | ORAL | Status: DC | PRN
  Filled 2016-04-01: qty 2

## 2016-04-01 MED ORDER — CARVEDILOL 3.125 MG PO TABS
3.1250 mg | ORAL_TABLET | Freq: Two times a day (BID) | ORAL | Status: DC
  Administered 2016-04-02: 3.125 mg via ORAL
  Filled 2016-04-01 (×3): qty 1

## 2016-04-01 MED ORDER — INSULIN GLARGINE 100 UNIT/ML ~~LOC~~ SOLN
10.0000 [IU] | Freq: Every day | SUBCUTANEOUS | Status: DC
  Administered 2016-04-02: 21:00:00 10 [IU] via SUBCUTANEOUS
  Filled 2016-04-01 (×3): qty 0.1

## 2016-04-01 MED ORDER — ASPIRIN 81 MG PO CHEW
324.0000 mg | CHEWABLE_TABLET | Freq: Once | ORAL | Status: AC
  Administered 2016-04-01: 324 mg via ORAL
  Filled 2016-04-01: qty 4

## 2016-04-01 MED ORDER — LISINOPRIL 5 MG PO TABS
2.5000 mg | ORAL_TABLET | Freq: Every day | ORAL | Status: DC
  Administered 2016-04-01 – 2016-04-03 (×3): 2.5 mg via ORAL
  Filled 2016-04-01 (×3): qty 1

## 2016-04-01 MED ORDER — ASPIRIN EC 81 MG PO TBEC
81.0000 mg | DELAYED_RELEASE_TABLET | Freq: Every day | ORAL | Status: DC
  Filled 2016-04-01: qty 1

## 2016-04-01 MED ORDER — ALBUTEROL SULFATE (2.5 MG/3ML) 0.083% IN NEBU: 3.0000 mL | INHALATION_SOLUTION | Freq: Four times a day (QID) | RESPIRATORY_TRACT | Status: DC | PRN

## 2016-04-01 MED ORDER — ASPIRIN 81 MG PO CHEW: 324.0000 mg | CHEWABLE_TABLET | ORAL | Status: DC

## 2016-04-01 MED ORDER — ENOXAPARIN SODIUM 120 MG/0.8ML ~~LOC~~ SOLN
1.0000 mg/kg | Freq: Two times a day (BID) | SUBCUTANEOUS | Status: DC
  Administered 2016-04-01 – 2016-04-02 (×2): 110 mg via SUBCUTANEOUS
  Filled 2016-04-01 (×3): qty 0.8

## 2016-04-01 MED ORDER — METFORMIN HCL 500 MG PO TABS
1000.0000 mg | ORAL_TABLET | Freq: Two times a day (BID) | ORAL | Status: DC
  Filled 2016-04-01: qty 2

## 2016-04-01 MED ORDER — ASPIRIN 81 MG PO CHEW: 324.0000 mg | CHEWABLE_TABLET | Freq: Once | ORAL | Status: DC

## 2016-04-01 NOTE — ED Notes (Signed)
Yesterday, Pt had 4 episodes of chest pain, relieved with nitro, concerned about high BP

## 2016-04-01 NOTE — ED Notes (Signed)
Patient given sprite at this time.

## 2016-04-01 NOTE — ED Triage Notes (Signed)
Pt states she was working last night felt nausea and check BP, it was elevated last night and today. Pt is having left side neck pain x 2 days

## 2016-04-01 NOTE — ED Notes (Signed)
Pt sleeping, IV infusing without difficulty

## 2016-04-01 NOTE — ED Notes (Signed)
MD at the bedside, one attempt u/s iv, infiltrated

## 2016-04-01 NOTE — ED Provider Notes (Signed)
[facility] DEPT Provider Note   CSN: [id number] Arrival date & time: 04/01/16  [id number]  By signing my name below, I, Jennifer Cervantes, attest that this documentation has been prepared under the direction and in the presence of Jennifer Manifold, MD. Electronically Signed: [name], Education administrator. 04/01/16. 10:29 AM. History   Chief Complaint Chief Complaint  Patient presents with  . Hypertension    The history is provided by the patient. No language interpreter was used.    HPI Comments: Jennifer Cervantes is a 36 y.o. female who presents to the Emergency Department complaining of intermittent CP that began yesterday. She describes her pain as a tightness and is relieved with nitroglycerin. She states this has happened about 4 times since onset. She states the episodes occur while she is at work. She has associated nausea and high blood pressure. She reports similar symptoms in the past with prior MI; reports having 2 cardiac stents placed July 2017. 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 She denies numbness, weakness, increased SOB, increased swelling, syncope, abdominal pain.   She also complains of left sided neck pain that began 2 days after waking from sleep. She states the pain is worse with turning her head to the left.   Past Medical History:  Diagnosis Date  . CAD (coronary artery disease)    a. LHC on 06/21/15 which showed 75% occl mid-dist LCx, 40% occl mRCA, 99% mid-dist LAD s/p DES and 50% occl mLAD s/p DES (overlapping stents) and significant LV dysfunction with anteroapical HK; b. 06/2015 Myoview: EF 55-65%, no ischemia/infarct.  . Diabetes mellitus without complication ([place])   . Hyperlipidemia   . Hypertension   . Myocardial stunning (HCC)    a. EF 45% on 2D ECHO on 06/21/15 and severe LV dysfunction on LV gram by cath. Repeat 2D ECHO with EF 55-60% and mild HK of the apicoseptal myocardium.  [name] Neuropraxia of right median nerve    a. suspected after right  radial cath. will follow with neuro outpatient if does not resolve.   . NSTEMI (non-ST elevated myocardial infarction) [facility]) 05/2015   [facility]  . Obesity   . 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 05/2015 Following PCI (radial access)-->conservatively managed.  . Tobacco abuse   . Tricuspid regurgitation   . Type II diabetes mellitus ([place])    a. Type II    Patient Active Problem List   Diagnosis Date Noted  . Chronic diastolic heart failure ([place]) 10/22/2015  . Light headed 10/05/2015  . Palpitation 10/05/2015  . Nausea with vomiting 10/05/2015  . History of MI (myocardial infarction) 10/05/2015  . Deficiency anemia 10/05/2015  . Low magnesium levels 10/05/2015  . DOE (dyspnea on exertion) 10/05/2015  . Hyperlipidemia   . 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 Chest pain 07/23/2015  . Chest pain with high risk for cardiac etiology 07/23/2015  . Type 2 diabetes mellitus with complication, without long-term current use of insulin ([place]) 06/29/2015  . Uncontrolled diabetes mellitus ([place])   . CAD (coronary artery disease)   . Tricuspid regurgitation   . Neuropraxia of right median nerve   . Tobacco abuse 06/21/2015  . Obesity 06/21/2015  . Abnormal thyroid function test 06/21/2015  . Non-ST elevation MI (NSTEMI) [facility])     Past Surgical History:  Procedure Laterality Date  . CARDIAC CATHETERIZATION N/A 06/21/2015   Procedure: Left Heart Cath and Coronary Angiography;  Surgeon: Belva Crome, MD; pLAD  99%, CFX 75%, RCA 40%, EF 35%   . CARDIAC CATHETERIZATION N/A 06/21/2015   Procedure: Coronary Stent Intervention;  Surgeon: Belva Crome, MD;  Promus Premier DES, 3.0 x 20 and 3.5 x 32 mm stents to the LAD  . CARDIAC CATHETERIZATION N/A 06/21/2015   Procedure: Intravascular Ultrasound/IVUS;  Surgeon: Belva Crome, MD;  Location: [facility] CV [facility];  Service: Cardiovascular;  Laterality: N/A;  LAD  . CARDIAC CATHETERIZATION  06/22/2015   Procedure: Coronary/Graft Angiography;   Surgeon: Peter M Martinique, MD;  Location: [facility];  Service: Cardiovascular;;    OB History    No data available       Home Medications    Prior to Admission medications   Medication Sig Start Date End Date Taking? Authorizing Provider  acetaminophen (TYLENOL) 500 MG tablet Take 500 mg by mouth every 6 (six) hours as needed for headache (pain).    Historical Provider, MD  albuterol (PROVENTIL HFA;VENTOLIN HFA) 108 (90 Base) MCG/ACT inhaler Inhale 2 puffs into the lungs every 6 (six) hours as needed for wheezing or shortness of breath. 10/05/15   Camelia Eng Tysinger, Jennifer-C  aspirin 81 MG EC tablet Take 1 tablet (81 mg total) by mouth daily. 07/17/15   Camelia Eng Tysinger, Jennifer-C  budesonide-formoterol (SYMBICORT) 160-4.5 MCG/ACT inhaler Inhale 2 puffs into the lungs 2 (two) times daily. 10/05/15   Camelia Eng Tysinger, Jennifer-C  Cholecalciferol (VITAMIN D3) 50000 units CAPS Take 1 capsule by mouth once a week. Patient taking differently: Take 1 capsule by mouth every Friday.  07/13/15   Camelia Eng Tysinger, Jennifer-C  clopidogrel (PLAVIX) 75 MG tablet Take 1 tablet (75 mg total) by mouth daily. 10/12/15   Carlena Hurl, Jennifer-C  ferrous gluconate (FERGON) 324 MG tablet Take 1 tablet (324 mg total) by mouth daily with breakfast. 10/07/15   Camelia Eng Tysinger, Jennifer-C  furosemide (LASIX) 20 MG tablet Take 1 tablet (20 mg total) by mouth daily. 12/14/15   Camelia Eng Tysinger, Jennifer-C  gabapentin (NEURONTIN) 300 MG capsule Take 1 capsule (300 mg total) by mouth 2 (two) times daily. 07/04/15   Kathrynn Ducking, MD  Insulin Glargine (LANTUS SOLOSTAR) 100 UNIT/ML Solostar Pen Inject 10 Units into the skin daily at 10 pm. 10/07/15   Camelia Eng Tysinger, Jennifer-C  Insulin Pen Needle (PEN NEEDLES) 32G X 4 MM MISC 1 each by Does not apply route daily. Use with Lantus pen 10/07/15   Camelia Eng Tysinger, Jennifer-C  magnesium gluconate (MAGONATE) 30 MG tablet Take 1 tablet (30 mg total) by mouth daily. 10/12/15   Camelia Eng Tysinger, Jennifer-C  metFORMIN  (GLUCOPHAGE) 1000 MG tablet Take 1 tablet (1,000 mg total) by mouth 2 (two) times daily with a meal. 12/19/15   Girtha Rm, NP  nicotine (NICODERM CQ - DOSED IN MG/24 HOURS) 21 mg/24hr patch Place 1 patch (21 mg total) onto the skin daily. 08/16/15   Camelia Eng Tysinger, Jennifer-C  nitroGLYCERIN (NITROSTAT) 0.4 MG SL tablet Place 1 tablet (0.4 mg total) under the tongue every 5 (five) minutes as needed for chest pain. 07/24/15   Rogelia Mire, NP  rosuvastatin (CRESTOR) 5 MG tablet Take 1 tablet (5 mg total) by mouth daily. 11/23/15 02/21/16  Belva Crome, MD    Family History Family History  Problem Relation Age of Onset  . Healthy Mother   . Healthy Father   . Healthy Sister   . Healthy Brother   . Heart attack Neg Hx   .  Heart failure Neg Hx   . Heart disease Neg Hx   . Stroke Neg Hx   . Hyperlipidemia Neg Hx   . Hypertension Neg Hx     Social History Social History  Substance Use Topics  . Smoking status: Current Every Day Smoker    Packs/day: 0.50    Years: 23.00    Types: Cigarettes    Last attempt to quit: 06/21/2015  . Smokeless tobacco: Never Used  . Alcohol use No     Allergies   Patient has no known allergies.   Review of Systems Review of Systems  A complete 10 system review of systems was obtained and all systems are negative except as noted in the HPI and PMH.    Physical Exam Updated Vital Signs BP 142/91 (BP Location: Left Arm)   Pulse 103   Temp 97.8 F (36.6 C) (Oral)   Resp 20   Ht 5\' 9"  (1.753 m)   Wt 240 lb (108.9 kg)   SpO2 100%   BMI 35.44 kg/m   Physical Exam  Constitutional: She is oriented to person, place, and time. She appears well-developed and well-nourished. No distress.  HENT:  Head: Normocephalic and atraumatic.  Eyes: EOM are normal.  Neck: Normal range of motion.  Tenderness to left sternocleidomastoid No midline spine tenderness Increased pain with ROM of the neck  Cardiovascular: Regular rhythm and normal heart  sounds.  Tachycardia present.   Mild tachycardia  Pulmonary/Chest: Effort normal and breath sounds normal. No respiratory distress. She has no wheezes. She has no rales.  Abdominal: Soft. She exhibits no distension. There is no tenderness.  Musculoskeletal: Normal range of motion.  NVI in LUE. Weakness in RUE which patient states is chronic for her.   Neurological: She is alert and oriented to person, place, and time.  Skin: Skin is warm and dry.  Psychiatric: She has a normal mood and affect. Judgment normal.  Nursing note and vitals reviewed.    ED Treatments / Results  DIAGNOSTIC STUDIES: Oxygen Saturation is 100% on RA, normal by my interpretation.    COORDINATION OF CARE: 10:28 AM Discussed treatment plan with pt at bedside and pt agreed to plan.   Labs (all labs ordered are listed, but only abnormal results are displayed) Labs Reviewed  CBC WITH DIFFERENTIAL/PLATELET - Abnormal; Notable for the following:       Result Value   WBC 10.7 (*)    All other components within normal limits  BASIC METABOLIC PANEL - Abnormal; Notable for the following:    Glucose, Bld 176 (*)    All other components within normal limits  CBC - Abnormal; Notable for the following:    WBC 12.1 (*)    All other components within normal limits  GLUCOSE, CAPILLARY - Abnormal; Notable for the following:    Glucose-Capillary 141 (*)    All other components within normal limits  GLUCOSE, CAPILLARY - Abnormal; Notable for the following:    Glucose-Capillary 108 (*)    All other components within normal limits  GLUCOSE, CAPILLARY - Abnormal; Notable for the following:    Glucose-Capillary 103 (*)    All other components within normal limits  BASIC METABOLIC PANEL - Abnormal; Notable for the following:    CO2 21 (*)    Glucose, Bld 148 (*)    Calcium 8.8 (*)    All other components within normal limits  CBC - Abnormal; Notable for the following:    RBC 3.78 (*)  Hemoglobin 11.2 (*)    HCT 34.5  (*)    All other components within normal limits  GLUCOSE, CAPILLARY - Abnormal; Notable for the following:    Glucose-Capillary 174 (*)    All other components within normal limits  GLUCOSE, CAPILLARY - Abnormal; Notable for the following:    Glucose-Capillary 162 (*)    All other components within normal limits  GLUCOSE, CAPILLARY - Abnormal; Notable for the following:    Glucose-Capillary 179 (*)    All other components within normal limits  GLUCOSE, CAPILLARY - Abnormal; Notable for the following:    Glucose-Capillary 151 (*)    All other components within normal limits  MRSA PCR SCREENING  PROTIME-INR  TROPONIN I  CREATININE, SERUM  TROPONIN I  TROPONIN I  PREGNANCY, URINE  CBC  CREATININE, SERUM  PROTIME-INR  POCT ACTIVATED CLOTTING TIME  POCT ACTIVATED CLOTTING TIME  POCT ACTIVATED CLOTTING TIME    EKG  EKG Interpretation  Date/Time:  Tuesday April 01 2016 09:50:41 EST Ventricular Rate:  110 PR Interval:    QRS Duration: 90 QT Interval:  356 QTC Calculation: 482 R Axis:   76 Text Interpretation:  Sinus tachycardia Consider right atrial enlargement Confirmed by Wilson Singer  MD, Symia Herdt [id number]) on 04/01/2016 12:08:14 PM       Radiology No results found.   Dg Chest 2 View  Result Date: 04/01/2016 CLINICAL DATA:  Chest pain and hypertension EXAM: CHEST  2 VIEW COMPARISON:  October 02, 2015 FINDINGS: There is no edema or consolidation. Heart size and pulmonary vascularity are normal. No adenopathy. There is a stent in the left anterior descending coronary artery. No bone lesions. No pneumothorax. IMPRESSION: No edema or consolidation. Electronically Signed   By: Lowella Grip III M.D.   On: 04/01/2016 12:08   Procedures Procedures (including critical care time)  Medications Ordered in ED Medications - No data to display   Initial Impression / Assessment and Plan / ED Course  I have reviewed the triage vital signs and the nursing notes.  Pertinent labs &  imaging results that were available during my care of the patient were reviewed by me and considered in my medical decision making (see chart for details).     36 y.o. female w/ hx of CAD, anterior infarction with LAD stent, hyperlipidemia, hypertension, type 2 diabetes, and obesity. EKG w/o acute changes. Her symptoms are concerning though. Discussed with cardiology. Transfer to [facility]   Final Clinical Impressions(s) / ED Diagnoses   Final diagnoses:  Chest pain, unspecified type    New Prescriptions New Prescriptions   No medications on file   I personally preformed the services scribed in my presence. The recorded information has been reviewed is accurate. Jennifer Manifold, MD.    Jennifer Manifold, MD 04/06/16 [phone]

## 2016-04-01 NOTE — ED Notes (Signed)
Pt states she is on Coumadin 75mg  every morning

## 2016-04-01 NOTE — H&P (Signed)
CARDIOLOGY HISTORY AND PHYSICAL   Patient ID: Jennifer Cervantes MRN: [id number]  DOB/AGE: Dec 28, 1980 36 y.o. Admit date: 04/01/2016  Primary Care Physician: Crisoforo Oxford, PA-C Primary Cardiologist: Daneen Schick III  Clinical Summary Ms. Coller is a 36 y.o.female with known history of CAD, with PCI/DES to LAD in April  of 2017, with re-look cardiac cath in May of 2017 in the setting of recurrent chest pain.Cath revealed patent LAD stent and non-obstructive RCA and LCX disease. Post cath in May she had right arm edema and numbness. 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 She was not found to be candidate at that time for thrombectomy in the setting of recent institution of [place]. Other history includes  Type II diabetes, tobacco abuse, hypertension, hyperlipidemia, and TR.    She was seen by Dr. Daneen Schick III on 10/23/2015, at which time the patient reports that he took her off her antihypertensive medication and isosorbide, furosemide, and carvedilol. No confirms this. Atorvastatin was to be discontinued in September 2015. Follow-up lipid panel was planned.  She presented to the emergency room today after experiencing significantly elevated blood pressures. 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 She reports 160/120 with associated chest discomfort. The following night when she was working and she began to have some nausea but no vomiting, recurrent chest pain for which she took nitroglycerin 2 one hour apart. Blood pressure remained elevated and she presented to the emergency room for further evaluation.  On arrival the patient's blood pressure was 142/91, heart rate 103, O2 sat 100% on room air. She was found have elevated white blood cells at 10.7, glucose 176 creatinine 0.97. Troponin less than 0.03. Chest x-ray revealed no edema or consolidation.  EKG revealed normal sinus rhythm, sinus tachycardia rate of 110 bpm. The patient was seen by the ED doctor who contacted cardiology. The patient has been seen by Dr. Johnsie Cancel in the ER.  No Known Allergies  Home Medications  (Not in a hospital admission)  Scheduled Medications . aspirin  324 mg Oral Once     Infusions   PRN Medications    Past Medical History:  Diagnosis Date  . CAD (coronary artery disease)    a. LHC on 06/21/15 which showed 75% occl mid-dist LCx, 40% occl mRCA, 99% mid-dist LAD s/p DES and 50% occl mLAD s/p DES (overlapping stents) and significant LV dysfunction with anteroapical HK; b. 06/2015 Myoview: EF 55-65%, no ischemia/infarct.  . Diabetes mellitus without complication ([place])   . Hyperlipidemia   . Hypertension   . Myocardial stunning (HCC)    a. EF 45% on 2D ECHO on 06/21/15 and severe LV dysfunction on LV gram by cath. Repeat 2D ECHO with EF 55-60% and mild HK of the apicoseptal myocardium.  [name] Neuropraxia of right median nerve    a. suspected after right radial cath. will follow with neuro outpatient if does not resolve.   . NSTEMI (non-ST elevated myocardial infarction) [facility] 05/2015   [facility]  . Obesity   . 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 05/2015 Following PCI (radial access)-->conservatively managed.  . Tobacco abuse   . Tricuspid regurgitation   . Type II diabetes mellitus ([place])    a. Type II    Past Surgical History:  Procedure Laterality Date  . CARDIAC CATHETERIZATION N/A 06/21/2015   Procedure: Left  Heart Cath and Coronary Angiography;  Surgeon: Belva Crome, MD; pLAD 99%, CFX 75%, RCA 40%, EF 35%   . CARDIAC CATHETERIZATION N/A 06/21/2015   Procedure: Coronary Stent Intervention;  Surgeon: Belva Crome, MD;  Promus Premier DES, 3.0 x 20 and 3.5 x 32 mm stents to the LAD  . CARDIAC CATHETERIZATION N/A 06/21/2015   Procedure: Intravascular Ultrasound/IVUS;  Surgeon: Belva Crome, MD;  Location: [place] [facility];  Service:  Cardiovascular;  Laterality: N/A;  LAD  . CARDIAC CATHETERIZATION  06/22/2015   Procedure: Coronary/Graft Angiography;  Surgeon: Kentrail Shew M Martinique, MD;  Location: [place] [facility];  Service: Cardiovascular;;    Family History  Problem Relation Age of Onset  . Healthy Mother   . Healthy Father   . Healthy Sister   . Healthy Brother   . Heart attack Neg Hx   . Heart failure Neg Hx   . Heart disease Neg Hx   . Stroke Neg Hx   . Hyperlipidemia Neg Hx   . Hypertension Neg Hx     Social History Ms. Krautkramer reports that she has been smoking Cigarettes.  She has a 11.50 pack-year smoking history. She has never used smokeless tobacco. Ms. Boelman reports that she does not drink alcohol.  Review of Systems Otherwise reviewed and negative except as outlined.  Physical Examination Temp:  [97.8 F (36.6 C)] 97.8 F (36.6 C) (02/06 0950) Pulse Rate:  [92-103] 92 (02/06 1216) Resp:  [16-20] 16 (02/06 1216) BP: (122-143)/(79-100) 122/79 (02/06 1216) SpO2:  [100 %] 100 % (02/06 1216) Weight:  [240 lb (108.9 kg)] 240 lb (108.9 kg) (02/06 0948) No intake or output data in the 24 hours ending 04/01/16 1309  Gen: No acute distress. HEENT: Conjunctiva and lids normal, oropharynx clear with moist mucosa. Neck: Supple, no elevated JVP or carotid bruits, no thyromegaly. Lungs: Clear to auscultation, nonlabored breathing at rest. Cardiac: Regular rate and rhythm, no S3 or significant systolic murmur, no pericardial rub. Abdomen: Soft, nontender, no hepatomegaly, bowel sounds present, no guarding or rebound. Extremities: No pitting edema, distal pulses 2+. Skin: Warm and dry. Musculoskeletal: No kyphosis. Neuropsychiatric: Alert and oriented x3, affect grossly appropriate.   Lab Results  Basic Metabolic Panel:  Recent Labs Lab 04/01/16 1110  NA 138  K 3.5  CL 103  CO2 25  GLUCOSE 176*  BUN 15  CREATININE 0.97  CALCIUM 9.6    Liver Function Tests: No results for  input(s): AST, ALT, ALKPHOS, BILITOT, PROT, ALBUMIN in the last 168 hours.  CBC:  Recent Labs Lab 04/01/16 1110  WBC 10.7*  NEUTROABS 7.4  HGB 12.6  HCT 37.1  MCV 90.9  PLT 253    Cardiac Enzymes:  Recent Labs Lab 04/01/16 1110  TROPONINI <0.03    BNP: Invalid input(s): POCBNP   Radiology Dg Chest 2 View  Result Date: 04/01/2016 CLINICAL DATA:  Chest pain and hypertension EXAM: CHEST  2 VIEW COMPARISON:  October 02, 2015 FINDINGS: There is no edema or consolidation. Heart size and pulmonary vascularity are normal. No adenopathy. There is a stent in the left anterior descending coronary artery. No bone lesions. No pneumothorax. IMPRESSION: No edema or consolidation. Electronically Signed   By: Lowella Grip III M.D.   On: 04/01/2016 12:08    Prior Cardiac Testing/Procedures:   1. Cardiac Cath 06/22/2015 Conclusion    Mid Cx to Dist Cx lesion, 75% stenosed.  Mid LAD to Dist LAD lesion, 10% stenosed. The lesion was previously  treated with a drug-eluting stent.  Mid RCA lesion, 40% stenosed.   The LAD stented segment is widely patent. No evidence of thrombus, distal embolization, or dissection. Side branches are intact. Other vessels are unchanged.    2. Cardiac Cath 06/21/2015 Conclusion   1. Mid Cx to Dist Cx lesion, 75% stenosed. 2. Mid RCA lesion, 40% stenosed. 3. There is moderate to severe left ventricular systolic dysfunction. 4. Mid LAD to Dist LAD lesion, 99% stenosed. Post intervention, there is a 10% residual stenosis. 5. Mid LAD lesion, 50% stenosed. Post intervention, there is a 0% residual stenosis.    Acute coronary syndrome with 99% thrombus filled stenosis in the mid LAD.  Moderate distal circumflex stenosis. Mild mid right coronary stenosis.  Successful angioplasty and stenting of the mid to proximal LAD using overlapping stents over a 50 mm segment postdilated to 3.75 mm in diameter.  Significant left ventricular dysfunction with  anteroapical severe hypokinesis and EF     Echocardiogram 06/25/2015 Left ventricle: The cavity size was normal. Systolic function was   normal. The estimated ejection fraction was in the range of 55%   to 60%. Very mild hypokinesis of the apicoseptal myocardium. Left   ventricular diastolic function parameters were normal. - Mitral valve: There was mild to moderate regurgitation directed   centrally. - Left atrium: The atrium was mildly dilated. - Tricuspid valve: There was mild-moderate regurgitation directed   centrally. - Pericardium, extracardiac: A trivial pericardial effusion was   identified.  ECG Sinus tachycardia rate of 110 bpm   Impression and Recommendations   1. Chest Pain: The patient began having substernal chest pain nonradiating with associated hypertension and nausea within the last 48 hours. He took 2 nitroglycerin one hour apart prior to coming to the emergency room due to recurrent chest pain. She noticed that her blood pressure was very elevated. Patient will be transferred for cardiac catheterization in the setting of recurrent chest pain with known CAD, with mid circumflex to distal circumflex lesion 75% stenosed. This is done in an effort to evaluate for restenosis of known LAD stent and progression of CAD. This is been discussed with the patient by Dr. Johnsie Cancel at the bedside who verbalizes understanding, the patient is aware of the risks and benefits as explained by Dr. Johnsie Cancel. The patient will be started on Coreg 3.125 mg twice a day, lisinopril 2.5 mg daily, and IV fluid hydration.  2. Coronary artery disease: Stent to mid to distal LAD, was residual 10% stenosis. Mid LAD lesion 50% stenosed with overlapping stents. Residual mid circumflex and distal circumflex lesion, 75% stenosis, with mid RCA lesion 40% stenosed. The patient has not been taking antihypertensive medications, as these were discontinued in August 2017. These will be restarted, with discussion of need  to continue post catheterization. Continue statin therapy.  3. Right radial thrombus: Patient had post cath complications in May 0000000 requiring vascular consultation. Would not recommend using right radial for cath access, and use right groin instead, or left wrist.  4. Hypertension: Recently patient self-reported uncontrolled. She states blood pressures been as high as 160/120. Some improvement after taking nitroglycerin sublingual. Recommend continuing antihypertensive medications post catheterization.  5. Type 2 diabetes: Elevated on admission to ER. Patient is on metformin. This will need to be held for 24 hours after catheterization.  .     SignedPhill Myron. Lawrence NP [place]  04/01/2016, 1:09 PM   Patient examined chart reviewed. S/P stent LAD April with residual 75% circumflex disease.  No acute ECG Changes and enzymes negative. She indicates compliance with DAT. Concern for in stent restenosis or Progression of circumflex disease.  Discussed options with patient and [facility] In am No heparin unless enzymes positive. Previous complications from right radial access with diminished Pulse would consider RFA acccss this time   [employer]

## 2016-04-01 NOTE — ED Notes (Signed)
Cardiologist at the bedside

## 2016-04-02 ENCOUNTER — Encounter (HOSPITAL_COMMUNITY): Payer: Self-pay | Admitting: *Deleted

## 2016-04-02 ENCOUNTER — Encounter (HOSPITAL_COMMUNITY)
Admission: EM | Disposition: A | Discharge status: Home / Self Care | Payer: Self-pay | Source: Home / Self Care | Attending: Cardiovascular Disease

## 2016-04-02 ENCOUNTER — Other Ambulatory Visit: Payer: Self-pay

## 2016-04-02 DIAGNOSIS — Z720 Tobacco use: Secondary | ICD-10-CM

## 2016-04-02 DIAGNOSIS — I200 Unstable angina: Secondary | ICD-10-CM

## 2016-04-02 DIAGNOSIS — I471 Supraventricular tachycardia: Secondary | ICD-10-CM

## 2016-04-02 DIAGNOSIS — I25110 Atherosclerotic heart disease of native coronary artery with unstable angina pectoris: Principal | ICD-10-CM

## 2016-04-02 DIAGNOSIS — I2510 Atherosclerotic heart disease of native coronary artery without angina pectoris: Secondary | ICD-10-CM

## 2016-04-02 DIAGNOSIS — E785 Hyperlipidemia, unspecified: Secondary | ICD-10-CM

## 2016-04-02 HISTORY — PX: LEFT HEART CATH AND CORONARY ANGIOGRAPHY: CATH118249

## 2016-04-02 HISTORY — PX: CORONARY STENT INTERVENTION: CATH118234

## 2016-04-02 LAB — CBC
HEMATOCRIT: 37.1 % (ref 36.0–46.0)
HEMOGLOBIN: 12.5 g/dL (ref 12.0–15.0)
MCH: 30.7 pg (ref 26.0–34.0)
MCHC: 33.7 g/dL (ref 30.0–36.0)
MCV: 91.2 fL (ref 78.0–100.0)
Platelets: 208 10*3/uL (ref 150–400)
RBC: 4.07 MIL/uL (ref 3.87–5.11)
RDW: 13.4 % (ref 11.5–15.5)
WBC: 7.2 10*3/uL (ref 4.0–10.5)

## 2016-04-02 LAB — POCT ACTIVATED CLOTTING TIME
Activated Clotting Time: 235 seconds
Activated Clotting Time: 296 seconds
Activated Clotting Time: 169 seconds

## 2016-04-02 LAB — CREATININE, SERUM: Creatinine, Ser: 0.78 mg/dL (ref 0.44–1.00)

## 2016-04-02 LAB — GLUCOSE, CAPILLARY
GLUCOSE-CAPILLARY: 103 mg/dL — AB (ref 65–99)
GLUCOSE-CAPILLARY: 108 mg/dL — AB (ref 65–99)
GLUCOSE-CAPILLARY: 162 mg/dL — AB (ref 65–99)
Glucose-Capillary: 174 mg/dL — ABNORMAL HIGH (ref 65–99)

## 2016-04-02 LAB — MRSA PCR SCREENING: MRSA by PCR: NEGATIVE

## 2016-04-02 LAB — PREGNANCY, URINE: Preg Test, Ur: NEGATIVE

## 2016-04-02 SURGERY — LEFT HEART CATH AND CORONARY ANGIOGRAPHY
Anesthesia: LOCAL

## 2016-04-02 MED ORDER — ATORVASTATIN CALCIUM 40 MG PO TABS
40.0000 mg | ORAL_TABLET | Freq: Every day | ORAL | Status: DC
  Administered 2016-04-02 – 2016-04-03 (×2): 40 mg via ORAL
  Filled 2016-04-02 (×2): qty 1

## 2016-04-02 MED ORDER — MIDAZOLAM HCL 2 MG/2ML IJ SOLN
INTRAMUSCULAR | Status: AC
  Filled 2016-04-02: qty 2

## 2016-04-02 MED ORDER — ACETAMINOPHEN 325 MG PO TABS: 650.0000 mg | ORAL_TABLET | ORAL | Status: DC | PRN

## 2016-04-02 MED ORDER — LABETALOL HCL 5 MG/ML IV SOLN: 10.0000 mg | INTRAVENOUS | Status: AC | PRN

## 2016-04-02 MED ORDER — NITROGLYCERIN 1 MG/10 ML FOR IR/CATH LAB
INTRA_ARTERIAL | Status: AC
  Filled 2016-04-02: qty 10

## 2016-04-02 MED ORDER — FENTANYL CITRATE (PF) 100 MCG/2ML IJ SOLN
INTRAMUSCULAR | Status: AC
  Filled 2016-04-02: qty 2

## 2016-04-02 MED ORDER — HYDRALAZINE HCL 20 MG/ML IJ SOLN: 5.0000 mg | INTRAMUSCULAR | Status: AC | PRN

## 2016-04-02 MED ORDER — CARVEDILOL 3.125 MG PO TABS
6.2500 mg | ORAL_TABLET | Freq: Two times a day (BID) | ORAL | Status: DC
  Administered 2016-04-02 – 2016-04-03 (×3): 6.25 mg via ORAL
  Filled 2016-04-02 (×3): qty 2

## 2016-04-02 MED ORDER — LIDOCAINE HCL (PF) 1 % IJ SOLN
INTRAMUSCULAR | Status: AC
  Filled 2016-04-02: qty 30

## 2016-04-02 MED ORDER — SODIUM CHLORIDE 0.9 % WEIGHT BASED INFUSION: 1.0000 mL/kg/h | INTRAVENOUS | Status: DC

## 2016-04-02 MED ORDER — SODIUM CHLORIDE 0.9 % IV SOLN
INTRAVENOUS | Status: AC
  Administered 2016-04-02: 14:00:00 via INTRAVENOUS

## 2016-04-02 MED ORDER — FENTANYL CITRATE (PF) 100 MCG/2ML IJ SOLN
INTRAMUSCULAR | Status: DC | PRN
Start: 2016-04-02 — End: 2016-04-02
  Administered 2016-04-02 (×2): 25 ug via INTRAVENOUS

## 2016-04-02 MED ORDER — SODIUM CHLORIDE 0.9% FLUSH: 3.0000 mL | INTRAVENOUS | Status: DC | PRN

## 2016-04-02 MED ORDER — HEPARIN (PORCINE) IN NACL 2-0.9 UNIT/ML-% IJ SOLN
INTRAMUSCULAR | Status: DC | PRN
  Administered 2016-04-02: 1000 mL

## 2016-04-02 MED ORDER — ASPIRIN 81 MG PO CHEW
81.0000 mg | CHEWABLE_TABLET | ORAL | Status: AC
  Administered 2016-04-02: 81 mg via ORAL
  Filled 2016-04-02: qty 1

## 2016-04-02 MED ORDER — CLOPIDOGREL BISULFATE 75 MG PO TABS
75.0000 mg | ORAL_TABLET | Freq: Every day | ORAL | Status: DC
  Administered 2016-04-03: 75 mg via ORAL
  Filled 2016-04-02: qty 1

## 2016-04-02 MED ORDER — ASPIRIN 81 MG PO CHEW
81.0000 mg | CHEWABLE_TABLET | Freq: Every day | ORAL | Status: DC
  Administered 2016-04-03: 81 mg via ORAL
  Filled 2016-04-02: qty 1

## 2016-04-02 MED ORDER — ONDANSETRON HCL 4 MG/2ML IJ SOLN: 4.0000 mg | Freq: Four times a day (QID) | INTRAMUSCULAR | Status: DC | PRN

## 2016-04-02 MED ORDER — HEPARIN SODIUM (PORCINE) 5000 UNIT/ML IJ SOLN
5000.0000 [IU] | Freq: Three times a day (TID) | INTRAMUSCULAR | Status: DC
  Administered 2016-04-02 – 2016-04-03 (×3): 5000 [IU] via SUBCUTANEOUS
  Filled 2016-04-02 (×3): qty 1

## 2016-04-02 MED ORDER — CLOPIDOGREL BISULFATE 75 MG PO TABS: 75.0000 mg | ORAL_TABLET | Freq: Every day | ORAL | Status: DC

## 2016-04-02 MED ORDER — SODIUM CHLORIDE 0.9 % IV SOLN: 250.0000 mL | INTRAVENOUS | Status: DC | PRN

## 2016-04-02 MED ORDER — CLOPIDOGREL BISULFATE 300 MG PO TABS
ORAL_TABLET | ORAL | Status: DC | PRN
  Administered 2016-04-02: 300 mg via ORAL

## 2016-04-02 MED ORDER — NITROGLYCERIN 2 % TD OINT
0.5000 [in_us] | TOPICAL_OINTMENT | Freq: Four times a day (QID) | TRANSDERMAL | Status: DC | PRN
  Administered 2016-04-02: 0.5 [in_us] via TOPICAL
  Filled 2016-04-02: qty 30

## 2016-04-02 MED ORDER — SODIUM CHLORIDE 0.9% FLUSH
3.0000 mL | Freq: Two times a day (BID) | INTRAVENOUS | Status: DC
  Administered 2016-04-02 – 2016-04-03 (×2): 3 mL via INTRAVENOUS

## 2016-04-02 MED ORDER — NITROGLYCERIN 1 MG/10 ML FOR IR/CATH LAB
INTRA_ARTERIAL | Status: DC | PRN
  Administered 2016-04-02: 200 ug via INTRACORONARY

## 2016-04-02 MED ORDER — IOPAMIDOL (ISOVUE-370) INJECTION 76%
INTRAVENOUS | Status: AC
  Filled 2016-04-02: qty 100

## 2016-04-02 MED ORDER — IOPAMIDOL (ISOVUE-370) INJECTION 76%
INTRAVENOUS | Status: DC | PRN
  Administered 2016-04-02: 105 mL via INTRA_ARTERIAL

## 2016-04-02 MED ORDER — ANGIOPLASTY BOOK
Freq: Once | Status: AC
  Administered 2016-04-02: 20:00:00
  Filled 2016-04-02: qty 1

## 2016-04-02 MED ORDER — HEPARIN (PORCINE) IN NACL 2-0.9 UNIT/ML-% IJ SOLN
INTRAMUSCULAR | Status: AC
  Filled 2016-04-02: qty 1000

## 2016-04-02 MED ORDER — LIDOCAINE HCL (PF) 1 % IJ SOLN
INTRAMUSCULAR | Status: DC | PRN
  Administered 2016-04-02: 20 mL

## 2016-04-02 MED ORDER — IOPAMIDOL (ISOVUE-370) INJECTION 76%
INTRAVENOUS | Status: AC
  Filled 2016-04-02: qty 50

## 2016-04-02 MED ORDER — CLOPIDOGREL BISULFATE 300 MG PO TABS
ORAL_TABLET | ORAL | Status: AC
  Filled 2016-04-02: qty 1

## 2016-04-02 MED ORDER — SODIUM CHLORIDE 0.9% FLUSH
3.0000 mL | Freq: Two times a day (BID) | INTRAVENOUS | Status: DC
  Administered 2016-04-02: 3 mL via INTRAVENOUS

## 2016-04-02 MED ORDER — VERAPAMIL HCL 2.5 MG/ML IV SOLN
INTRAVENOUS | Status: AC
  Filled 2016-04-02: qty 2

## 2016-04-02 MED ORDER — SODIUM CHLORIDE 0.9 % WEIGHT BASED INFUSION: 3.0000 mL/kg/h | INTRAVENOUS | Status: DC

## 2016-04-02 MED ORDER — MIDAZOLAM HCL 2 MG/2ML IJ SOLN
INTRAMUSCULAR | Status: DC | PRN
  Administered 2016-04-02: 2 mg via INTRAVENOUS
  Administered 2016-04-02: 1 mg via INTRAVENOUS

## 2016-04-02 MED ORDER — MORPHINE SULFATE (PF) 2 MG/ML IV SOLN
2.0000 mg | INTRAVENOUS | Status: DC | PRN
  Administered 2016-04-02: 16:00:00 2 mg via INTRAVENOUS
  Filled 2016-04-02: qty 1

## 2016-04-02 MED ORDER — HEPARIN SODIUM (PORCINE) 1000 UNIT/ML IJ SOLN
INTRAMUSCULAR | Status: DC | PRN
  Administered 2016-04-02: 10000 [IU] via INTRAVENOUS

## 2016-04-02 MED ORDER — HEPARIN SODIUM (PORCINE) 1000 UNIT/ML IJ SOLN
INTRAMUSCULAR | Status: AC
  Filled 2016-04-02: qty 1

## 2016-04-02 SURGICAL SUPPLY — 19 items
BALLN MOZEC 2.50X17 (BALLOONS) ×2
BALLN ~~LOC~~ EUPHORA RX 3.25X15 (BALLOONS) ×2
BALLOON MOZEC 2.50X17 (BALLOONS) ×1 IMPLANT
BALLOON ~~LOC~~ EUPHORA RX 3.25X15 (BALLOONS) ×1 IMPLANT
CATH INFINITI 5FR JL5 (CATHETERS) ×2 IMPLANT
CATH INFINITI 5FR MULTPACK ANG (CATHETERS) ×2 IMPLANT
GUIDE CATH RUNWAY 6FR CLS4 (CATHETERS) ×2 IMPLANT
HOVERMATT SINGLE USE (MISCELLANEOUS) ×2 IMPLANT
KIT ENCORE 26 ADVANTAGE (KITS) ×2 IMPLANT
KIT HEART LEFT (KITS) ×2 IMPLANT
PACK CARDIAC CATHETERIZATION (CUSTOM PROCEDURE TRAY) ×2 IMPLANT
SHEATH PINNACLE 5F 10CM (SHEATH) ×2 IMPLANT
SHEATH PINNACLE 6F 10CM (SHEATH) ×2 IMPLANT
STENT PROMUS PREM MR 3.0X24 (Permanent Stent) ×2 IMPLANT
TRANSDUCER W/STOPCOCK (MISCELLANEOUS) ×2 IMPLANT
TUBING CIL FLEX 10 FLL-RA (TUBING) ×2 IMPLANT
VALVE GUARDIAN II ~~LOC~~ HEMO (MISCELLANEOUS) ×2 IMPLANT
WIRE ASAHI PROWATER 180CM (WIRE) ×2 IMPLANT
WIRE EMERALD 3MM-J .035X150CM (WIRE) ×2 IMPLANT

## 2016-04-02 NOTE — H&P (View-Only) (Signed)
Progress Note  Patient Name: Jennifer Cervantes Date of Encounter: 04/02/2016  Primary Cardiologist: Dr. Tamala Julian  Subjective   C/o headache with nitro patch. Anxious about cath.   Inpatient Medications    Scheduled Meds: . aspirin EC  81 mg Oral Daily  . carvedilol  3.125 mg Oral BID WC  . clopidogrel  75 mg Oral Daily  . enoxaparin (LOVENOX) injection  1 mg/kg Subcutaneous Q12H  . gabapentin  300 mg Oral BID  . insulin glargine  10 Units Subcutaneous Q2200  . lisinopril  2.5 mg Oral Daily  . metFORMIN  1,000 mg Oral BID WC  . sodium chloride flush  3 mL Intravenous Q12H   Continuous Infusions: . sodium chloride 75 mL/hr at 04/02/16 0600  . sodium chloride     PRN Meds: sodium chloride, acetaminophen, albuterol, nitroGLYCERIN, nitroGLYCERIN, ondansetron (ZOFRAN) IV, sodium chloride flush   Vital Signs    Vitals:   04/01/16 2345 04/02/16 0440 04/02/16 0628 04/02/16 0858  BP: 113/68 104/70  100/63  Pulse: 73   85  Resp:    18  Temp:  98.2 F (36.8 C)  98.1 F (36.7 C)  TempSrc:  Oral  Oral  SpO2: 100%   99%  Weight:   241 lb 10 oz (109.6 kg)   Height:        Intake/Output Summary (Last 24 hours) at 04/02/16 1128 Last data filed at 04/02/16 0900  Gross per 24 hour  Intake              720 ml  Output              200 ml  Net              520 ml   Filed Weights   04/01/16 0948 04/01/16 2208 04/02/16 0628  Weight: 240 lb (108.9 kg) 241 lb 8 oz (109.5 kg) 241 lb 10 oz (109.6 kg)    Telemetry    SR with episode of SVT - Personally Reviewed  ECG    SR without acute ST/T wave changes Prolonged QTc- Personally Reviewed  Physical Exam   GEN: Pleasant young AAF, No acute distress.   Neck: No JVD Cardiac: RRR, 2/6 systolic murmur, rubs, or gallops.  Respiratory: Clear to auscultation bilaterally. GI: Soft, nontender, non-distended  MS: No edema; No deformity. Neuro:  Nonfocal  Psych: Normal affect   Labs    Chemistry  Recent Labs Lab  04/01/16 1110 04/01/16 1415  NA 138  --   K 3.5  --   CL 103  --   CO2 25  --   GLUCOSE 176*  --   BUN 15  --   CREATININE 0.97 0.95  CALCIUM 9.6  --   GFRNONAA >60 >60  GFRAA >60 >60  ANIONGAP 10  --      Hematology  Recent Labs Lab 04/01/16 1110 04/01/16 1415  WBC 10.7* 12.1*  RBC 4.08 4.25  HGB 12.6 12.9  HCT 37.1 38.8  MCV 90.9 91.3  MCH 30.9 30.4  MCHC 34.0 33.2  RDW 13.2 13.2  PLT 253 263    Cardiac Enzymes  Recent Labs Lab 04/01/16 1110 04/01/16 1415 04/01/16 1915  TROPONINI <0.03 <0.03 <0.03   No results for input(s): TROPIPOC in the last 168 hours.   BNPNo results for input(s): BNP, PROBNP in the last 168 hours.   DDimer No results for input(s): DDIMER in the last 168 hours.   Radiology    Dg  Chest 2 View  Result Date: 04/01/2016 CLINICAL DATA:  Chest pain and hypertension EXAM: CHEST  2 VIEW COMPARISON:  October 02, 2015 FINDINGS: There is no edema or consolidation. Heart size and pulmonary vascularity are normal. No adenopathy. There is a stent in the left anterior descending coronary artery. No bone lesions. No pneumothorax. IMPRESSION: No edema or consolidation. Electronically Signed   By: Lowella Grip III M.D.   On: 04/01/2016 12:08    Cardiac Studies   N/A  Patient Profile     36 y.o. female CAD, with PCI/DES to LAD in April  of 2017, with re-look cardiac cath in May of 2017 in the setting of recurrent chest pain who presented to the [facility] with chest pain and elevated blood pressure. Transferred to [facility] for cardiac cath.   Assessment & Plan    1. Chest pain: Still having mild chest pain, but more so c/o headache with nitro paste. Trop neg x3. EKG nonacute this morning. Reports being compliant with ASA and Plavix. Had a right radial thrombus with previous cath.  -- Planned for cardiac catheterization today. -- continue ASA, Plavix, BB, ACEi  2. HTN: States she was not taking her antihypertensives at home prior to admission. Home  medications were resumed on admission.  -- continue BB, and ACEi  3. HL: States she was taken off statin, noted indicate she was placed on Crestor 5mg . Would resume statin therapy give her stent placement and residual disease.   4. NIDDM: Metformin originally order, but will hold given plans for cath.  5. SVT: noted on telemetry, will increase coreg to 6.25mg  BID.   Signed, Reino Bellis, NP  04/02/2016, 11:28 AM    Patient seen and examined. Agree with assessment and plan. I had a long discussion with pt. Apparently she had been taken off atorvastatin in August but there were plans to re-institute but this was never done. She has continued to smoke cigarettes. Discussed smoking cessation and the importance of compliance.  Will initiate high potency statin with crestor 40 mg. Plan cath today. I have reviewed the risks, indications, and alternatives to cardiac catheterization, possible angioplasty, and stenting with the patient. Risks include but are not limited to bleeding, infection, vascular injury, stroke, myocardial infection, arrhythmia, kidney injury, radiation-related injury in the case of prolonged fluoroscopy use, emergency cardiac surgery, and death. The patient understands the risks of serious complication is 1-2 in 123XX123 with diagnostic cardiac cath and 1-2% or less with angioplasty/stenting. Probably plan cath via femoral approach with radial thrombus at previous cath.    Troy Sine, MD, [facility] 04/02/2016 11:49 AM

## 2016-04-02 NOTE — Progress Notes (Signed)
Progress Note  Patient Name: Jennifer Cervantes Date of Encounter: 04/02/2016  Primary Cardiologist: Dr. Tamala Julian  Subjective   C/o headache with nitro patch. Anxious about cath.   Inpatient Medications    Scheduled Meds: . aspirin EC  81 mg Oral Daily  . carvedilol  3.125 mg Oral BID WC  . clopidogrel  75 mg Oral Daily  . enoxaparin (LOVENOX) injection  1 mg/kg Subcutaneous Q12H  . gabapentin  300 mg Oral BID  . insulin glargine  10 Units Subcutaneous Q2200  . lisinopril  2.5 mg Oral Daily  . metFORMIN  1,000 mg Oral BID WC  . sodium chloride flush  3 mL Intravenous Q12H   Continuous Infusions: . sodium chloride 75 mL/hr at 04/02/16 0600  . sodium chloride     PRN Meds: sodium chloride, acetaminophen, albuterol, nitroGLYCERIN, nitroGLYCERIN, ondansetron (ZOFRAN) IV, sodium chloride flush   Vital Signs    Vitals:   04/01/16 2345 04/02/16 0440 04/02/16 0628 04/02/16 0858  BP: 113/68 104/70  100/63  Pulse: 73   85  Resp:    18  Temp:  98.2 F (36.8 C)  98.1 F (36.7 C)  TempSrc:  Oral  Oral  SpO2: 100%   99%  Weight:   241 lb 10 oz (109.6 kg)   Height:        Intake/Output Summary (Last 24 hours) at 04/02/16 1128 Last data filed at 04/02/16 0900  Gross per 24 hour  Intake              720 ml  Output              200 ml  Net              520 ml   Filed Weights   04/01/16 0948 04/01/16 2208 04/02/16 0628  Weight: 240 lb (108.9 kg) 241 lb 8 oz (109.5 kg) 241 lb 10 oz (109.6 kg)    Telemetry    SR with episode of SVT - Personally Reviewed  ECG    SR without acute ST/T wave changes Prolonged QTc- Personally Reviewed  Physical Exam   GEN: Pleasant young AAF, No acute distress.   Neck: No JVD Cardiac: RRR, 2/6 systolic murmur, rubs, or gallops.  Respiratory: Clear to auscultation bilaterally. GI: Soft, nontender, non-distended  MS: No edema; No deformity. Neuro:  Nonfocal  Psych: Normal affect   Labs    Chemistry  Recent Labs Lab  04/01/16 1110 04/01/16 1415  NA 138  --   K 3.5  --   CL 103  --   CO2 25  --   GLUCOSE 176*  --   BUN 15  --   CREATININE 0.97 0.95  CALCIUM 9.6  --   GFRNONAA >60 >60  GFRAA >60 >60  ANIONGAP 10  --      Hematology  Recent Labs Lab 04/01/16 1110 04/01/16 1415  WBC 10.7* 12.1*  RBC 4.08 4.25  HGB 12.6 12.9  HCT 37.1 38.8  MCV 90.9 91.3  MCH 30.9 30.4  MCHC 34.0 33.2  RDW 13.2 13.2  PLT 253 263    Cardiac Enzymes  Recent Labs Lab 04/01/16 1110 04/01/16 1415 04/01/16 1915  TROPONINI <0.03 <0.03 <0.03   No results for input(s): TROPIPOC in the last 168 hours.   BNPNo results for input(s): BNP, PROBNP in the last 168 hours.   DDimer No results for input(s): DDIMER in the last 168 hours.   Radiology    Dg  Chest 2 View  Result Date: 04/01/2016 CLINICAL DATA:  Chest pain and hypertension EXAM: CHEST  2 VIEW COMPARISON:  October 02, 2015 FINDINGS: There is no edema or consolidation. Heart size and pulmonary vascularity are normal. No adenopathy. There is a stent in the left anterior descending coronary artery. No bone lesions. No pneumothorax. IMPRESSION: No edema or consolidation. Electronically Signed   By: Lowella Grip III M.D.   On: 04/01/2016 12:08    Cardiac Studies   N/A  Patient Profile     37 y.o. female CAD, with PCI/DES to LAD in April  of 2017, with re-look cardiac cath in May of 2017 in the setting of recurrent chest pain who presented to the [facility] with chest pain and elevated blood pressure. Transferred to [facility] for cardiac cath.   Assessment & Plan    1. Chest pain: Still having mild chest pain, but more so c/o headache with nitro paste. Trop neg x3. EKG nonacute this morning. Reports being compliant with ASA and Plavix. Had a right radial thrombus with previous cath.  -- Planned for cardiac catheterization today. -- continue ASA, Plavix, BB, ACEi  2. HTN: States she was not taking her antihypertensives at home prior to admission. Home  medications were resumed on admission.  -- continue BB, and ACEi  3. HL: States she was taken off statin, noted indicate she was placed on Crestor 5mg . Would resume statin therapy give her stent placement and residual disease.   4. NIDDM: Metformin originally order, but will hold given plans for cath.  5. SVT: noted on telemetry, will increase coreg to 6.25mg  BID.   Signed, Reino Bellis, NP  04/02/2016, 11:28 AM    Patient seen and examined. Agree with assessment and plan. I had a long discussion with pt. Apparently she had been taken off atorvastatin in August but there were plans to re-institute but this was never done. She has continued to smoke cigarettes. Discussed smoking cessation and the importance of compliance.  Will initiate high potency statin with crestor 40 mg. Plan cath today. I have reviewed the risks, indications, and alternatives to cardiac catheterization, possible angioplasty, and stenting with the patient. Risks include but are not limited to bleeding, infection, vascular injury, stroke, myocardial infection, arrhythmia, kidney injury, radiation-related injury in the case of prolonged fluoroscopy use, emergency cardiac surgery, and death. The patient understands the risks of serious complication is 1-2 in [id number] with diagnostic cardiac cath and 1-2% or less with angioplasty/stenting. Probably plan cath via femoral approach with radial thrombus at previous cath.    Troy Sine, MD, [facility] 04/02/2016 11:49 AM

## 2016-04-02 NOTE — Interval H&P Note (Signed)
Cath Lab Visit (complete for each Cath Lab visit)  Clinical Evaluation Leading to the Procedure:   ACS: Yes.    Non-ACS:    Anginal Classification: CCS IV  Anti-ischemic medical therapy: Minimal Therapy (1 class of medications)  Non-Invasive Test Results: No non-invasive testing performed  Prior CABG: No previous CABG      History and Physical Interval Note:  04/02/2016 12:00 PM  Jennifer Cervantes  has presented today for surgery, with the diagnosis of cp  The various methods of treatment have been discussed with the patient and family. After consideration of risks, benefits and other options for treatment, the patient has consented to  Procedure(s): Left Heart Cath and Coronary Angiography (N/A) as a surgical intervention .  The patient's history has been reviewed, patient examined, no change in status, stable for surgery.  I have reviewed the patient's chart and labs.  Questions were answered to the patient's satisfaction.     Larae Grooms

## 2016-04-02 NOTE — Consult Note (Signed)
   [facility] CM Inpatient Consult   04/02/2016  Jennifer Cervantes [birth year] [id number]    Spoke with [name] at bedside on behalf of Norm Parcel to [facility] Care Management program for [facility] employees/dependents with [facility] insurance. [name] is active with [employer] [facility] [employer] for DM management. She reports that she has been in contact with [facility] RN Coordinator and that her appointment is scheduled for February 12th at 9 am. [name] endorses she works night shift and she is often asleep when outreach telephone call attempts. [name] states she is supposed to have a stent placed today. Made her aware that writer will touch base with [employer] RN Coordinator to make aware of bedside visit.   Marthenia Rolling, MSN-Ed, RN,BSN [facility] Liaison [phone]

## 2016-04-03 DIAGNOSIS — Z716 Tobacco abuse counseling: Secondary | ICD-10-CM

## 2016-04-03 LAB — CBC
HCT: 34.5 % — ABNORMAL LOW (ref 36.0–46.0)
Hemoglobin: 11.2 g/dL — ABNORMAL LOW (ref 12.0–15.0)
MCH: 29.6 pg (ref 26.0–34.0)
MCHC: 32.5 g/dL (ref 30.0–36.0)
MCV: 91.3 fL (ref 78.0–100.0)
PLATELETS: 212 10*3/uL (ref 150–400)
RBC: 3.78 MIL/uL — ABNORMAL LOW (ref 3.87–5.11)
RDW: 13.2 % (ref 11.5–15.5)
WBC: 8 10*3/uL (ref 4.0–10.5)

## 2016-04-03 LAB — BASIC METABOLIC PANEL
Anion gap: 9 (ref 5–15)
BUN: 7 mg/dL (ref 6–20)
CHLORIDE: 107 mmol/L (ref 101–111)
CO2: 21 mmol/L — ABNORMAL LOW (ref 22–32)
CREATININE: 0.77 mg/dL (ref 0.44–1.00)
Calcium: 8.8 mg/dL — ABNORMAL LOW (ref 8.9–10.3)
GFR calc Af Amer: >60 mL/min (ref >60)
GFR calc non Af Amer: >60 mL/min (ref >60)
GLUCOSE: 148 mg/dL — AB (ref 65–99)
Potassium: 3.7 mmol/L (ref 3.5–5.1)
SODIUM: 137 mmol/L (ref 135–145)

## 2016-04-03 LAB — PROTIME-INR
INR: 0.94
Prothrombin Time: 12.5 seconds (ref 11.4–15.2)

## 2016-04-03 LAB — GLUCOSE, CAPILLARY
Glucose-Capillary: 179 mg/dL — ABNORMAL HIGH (ref 65–99)
Glucose-Capillary: 151 mg/dL — ABNORMAL HIGH (ref 65–99)

## 2016-04-03 MED ORDER — LISINOPRIL 2.5 MG PO TABS: 2.5000 mg | ORAL_TABLET | Freq: Every day | ORAL | 6 refills | Status: DC

## 2016-04-03 MED ORDER — ATORVASTATIN CALCIUM 40 MG PO TABS: 40.0000 mg | ORAL_TABLET | Freq: Every day | ORAL | 6 refills | Status: DC

## 2016-04-03 MED ORDER — METOPROLOL TARTRATE 50 MG PO TABS: 50.0000 mg | ORAL_TABLET | Freq: Two times a day (BID) | ORAL | 6 refills | Status: DC

## 2016-04-03 MED ORDER — PANTOPRAZOLE SODIUM 40 MG PO TBEC: 40.0000 mg | DELAYED_RELEASE_TABLET | Freq: Every day | ORAL | 6 refills | Status: DC

## 2016-04-03 MED FILL — PANTOPRAZOLE SOD DR 40 MG T: 40 | 30 days supply | Qty: 30 | Fill #0

## 2016-04-03 MED FILL — ATORVASTATIN 40 MG TABLET: 40 | 30 days supply | Qty: 30 | Fill #0

## 2016-04-03 MED FILL — METOPROLOL TARTRATE 50 MG T: 50 | 30 days supply | Qty: 60 | Fill #0

## 2016-04-03 MED FILL — LISINOPRIL 2.5 MG TABLET: 2.5 | 30 days supply | Qty: 30 | Fill #0

## 2016-04-03 NOTE — Consult Note (Signed)
   [facility] CM Inpatient Consult   04/03/2016  Jennifer Cervantes [birth year] [id number]    Received notification from [facility] RN Coordinator that upcoming appointment, February 12th, with Jennifer Cervantes would need to be rescheduled. Telephone call made to Jennifer Cervantes to request that she contact [facility] office to reschedule appointment. Jennifer Cervantes agreeable to this. Writer sent [facility] telephone number to patient via text for her to call at her convenience.    Marthenia Rolling, MSN-Ed, RN,BSN [facility] Liaison [phone]

## 2016-04-03 NOTE — Progress Notes (Signed)
Progress Note  Patient Name: Jennifer Cervantes Date of Encounter: 04/03/2016  Primary Cardiologist: Dr. Tamala Julian  Subjective   No further chest pain this morning. Does continue to have some mild palpitations at times.   Inpatient Medications    Scheduled Meds: . aspirin  81 mg Oral Daily  . atorvastatin  40 mg Oral q1800  . carvedilol  6.25 mg Oral BID WC  . clopidogrel  75 mg Oral Q breakfast  . gabapentin  300 mg Oral BID  . heparin  5,000 Units Subcutaneous Q8H  . insulin glargine  10 Units Subcutaneous Q2200  . lisinopril  2.5 mg Oral Daily  . sodium chloride flush  3 mL Intravenous Q12H   Continuous Infusions:  PRN Meds: sodium chloride, acetaminophen, albuterol, morphine injection, nitroGLYCERIN, nitroGLYCERIN, ondansetron (ZOFRAN) IV, sodium chloride flush   Vital Signs    Vitals:   04/02/16 1930 04/02/16 2000 04/03/16 0236 04/03/16 0732  BP:  110/71 113/70 117/72  Pulse: 78 74 61 65  Resp: 16 (!) 21 14   Temp:   97.7 F (36.5 C) 97.7 F (36.5 C)  TempSrc:   Oral Oral  SpO2: 99% 98% 99% 99%  Weight:   242 lb 8.1 oz (110 kg)   Height:        Intake/Output Summary (Last 24 hours) at 04/03/16 0841 Last data filed at 04/03/16 0636  Gross per 24 hour  Intake             1760 ml  Output             1200 ml  Net              560 ml   Filed Weights   04/01/16 2208 04/02/16 0628 04/03/16 0236  Weight: 241 lb 8 oz (109.5 kg) 241 lb 10 oz (109.6 kg) 242 lb 8.1 oz (110 kg)    Telemetry    SR with some ST at times - Personally Reviewed  ECG    SR no change - Personally Reviewed  Physical Exam   GEN: Pleasant young AAF, No acute distress.   Neck: No JVD Cardiac: RRR, 2/6 murmur, rubs, or gallops.  Respiratory: Clear to auscultation bilaterally. GI: Soft, nontender, non-distended  MS: No edema; No deformity. Right groin without bruising, small hematoma (soft) Neuro:  Nonfocal  Psych: Normal affect   Labs    Chemistry Recent Labs Lab  04/01/16 1110 04/01/16 1415 04/02/16 1417 04/03/16 0200  NA 138  --   --  137  K 3.5  --   --  3.7  CL 103  --   --  107  CO2 25  --   --  21*  GLUCOSE 176*  --   --  148*  BUN 15  --   --  7  CREATININE 0.97 0.95 0.78 0.77  CALCIUM 9.6  --   --  8.8*  GFRNONAA >60 >60 >60 >60  GFRAA >60 >60 >60 >60  ANIONGAP 10  --   --  9     Hematology Recent Labs Lab 04/01/16 1415 04/02/16 1417 04/03/16 0200  WBC 12.1* 7.2 8.0  RBC 4.25 4.07 3.78*  HGB 12.9 12.5 11.2*  HCT 38.8 37.1 34.5*  MCV 91.3 91.2 91.3  MCH 30.4 30.7 29.6  MCHC 33.2 33.7 32.5  RDW 13.2 13.4 13.2  PLT 263 208 212    Cardiac Enzymes Recent Labs Lab 04/01/16 1110 04/01/16 1415 04/01/16 1915  TROPONINI <0.03 <0.03 <0.03  No results for input(s): TROPIPOC in the last 168 hours.   BNPNo results for input(s): BNP, PROBNP in the last 168 hours.   DDimer No results for input(s): DDIMER in the last 168 hours.   Lipid Panel     Component Value Date/Time   CHOL 140 01/25/2016 1015   TRIG 63 01/25/2016 1015   HDL 43 (L) 01/25/2016 1015   CHOLHDL 3.3 01/25/2016 1015   VLDL 13 01/25/2016 1015   LDLCALC 84 01/25/2016 1015    Radiology    Dg Chest 2 View  Result Date: 04/01/2016 CLINICAL DATA:  Chest pain and hypertension EXAM: CHEST  2 VIEW COMPARISON:  October 02, 2015 FINDINGS: There is no edema or consolidation. Heart size and pulmonary vascularity are normal. No adenopathy. There is a stent in the left anterior descending coronary artery. No bone lesions. No pneumothorax. IMPRESSION: No edema or consolidation. Electronically Signed   By: Lowella Grip III M.D.   On: 04/01/2016 12:08    Cardiac Studies   LHC: 04/02/16   Patent prior stents in the LAD.  Mid RCA lesion, 40 %stenosed.  Mid Cx to Dist Cx lesion, 50 %stenosed. This lesion appears improved compared to prior cath.  Prox LAD lesion, 30 %stenosed, just at the proximal edge of the previously stented segment.  1st Mrg lesion, 25  %stenosed.  Dist LAD lesion, 90 %stenosed. A STENT PROMUS PREM MR 3.0X24 drug eluting stent was successfully placed, and overlaps previously placed stent, postdilated to 3.3 mm.  Post intervention, there is a 0% residual stenosis.  The left ventricular systolic function is normal.  LV end diastolic pressure is mildly elevated.  The left ventricular ejection fraction is 55-65% by visual estimate.  There is no aortic valve stenosis.   Continue aspirin and Plavix for at least a year and likely beyond given how long of a stented segment exists in the LAD.  She needs risk factor modification including smoking cessation and weight loss.  Diagnostic Diagram     Post-Intervention Diagram        Patient Profile     36 y.o. female  CAD, with PCI/DES to LAD in April of 2017, with re-look cardiac cath in May of 2017 in the setting of recurrent chest pain who presented to the [facility] with chest pain and elevated blood pressure. Transferred to [facility] [address] for cardiac cath.   Assessment & Plan    1. Chest pain: Underwent LHC with Dr. Irish Lack yesterday with DES placed to dLAD, 2 patent stents noted to the p/mLAD, with 40% lesion to RCA and 50% lesion to m/d Lcx. Labs stable post procedure. Did have some bleeding this morning from groin, but now soft, no bleeding. -- continue ASA, Plavix, BB, ACEi  2. HTN: States she was not taking her antihypertensives at home prior to admission. Home medications were resumed on admission. Blood pressure stable. -- continue BB, and ACEi  3. HL: States she was taken off statin, noted indicate she was placed on Crestor 5mg . Lipitor 40mg  resumed this admission.  4. NIDDM: Metformin can be resumed 48 hrs post cath.  5. SVT: some episodes still noted on telemetry. BB was increased to 6.25mg  BID yesterday. Change to metoprolol?  6. Tobacco use: Cessation strongly encouraged. States she plans to stop.   Signed, Reino Bellis, NP  04/03/2016, 8:41 AM      Patient seen and examined. Agree with assessment and plan. No recurrent chest pain.  Tenderness at R groin cath site. No bruit.  Will change coreg to metoprolol succinate 50 mg with SVT. Now back on statin; target LDL < 70.  Ambulate later today and probable dc if stable.     Troy Sine, MD, [facility] 04/03/2016 10:16 AM

## 2016-04-03 NOTE — Care Management Note (Signed)
Case Management Note  Patient Details  Name: Jennifer Cervantes MRN: [id number] Date of Birth: 1980/06/28  Subjective/Objective:   S/p coronary stent intervention, will be on plavix, NCM will cont to follow for dc needs.                 Action/Plan:   Expected Discharge Date:                  Expected Discharge Plan:  Home/Self Care  In-House Referral:     Discharge planning Services  CM Consult  Post Acute Care Choice:    Choice offered to:     DME Arranged:    DME Agency:     HH Arranged:    HH Agency:     Status of Service:  Completed, signed off  If discussed at [employer] of Stay Meetings, dates discussed:    Additional Comments:  Zenon Mayo, RN 04/03/2016, 11:45 AM

## 2016-04-03 NOTE — Discharge Summary (Signed)
Discharge Summary    Patient ID: XICLALY ELSAESSER,  MRN: [id number], DOB/AGE: 38-Jul-1982 38 y.o.  Admit date: 04/01/2016 Discharge date: 04/03/2016  Primary Care Provider: Crisoforo Oxford Primary Cardiologist: Dr. Tamala Julian  Discharge Diagnoses    Active Problems:   CAD (coronary artery disease)   Chest pain   Tobacco abuse   Hyperlipidemia   Palpitation  Allergies No Known Allergies  Diagnostic Studies/Procedures    LHC: 04/02/16     Patent prior stents in the LAD.  Mid RCA lesion, 40 %stenosed.  Mid Cx to Dist Cx lesion, 50 %stenosed. This lesion appears improved compared to prior cath.  Prox LAD lesion, 30 %stenosed, just at the proximal edge of the previously stented segment.  1st Mrg lesion, 25 %stenosed.  Dist LAD lesion, 90 %stenosed. A STENT PROMUS PREM MR 3.0X24 drug eluting stent was successfully placed, and overlaps previously placed stent, postdilated to 3.3 mm.  Post intervention, there is a 0% residual stenosis.  The left ventricular systolic function is normal.  LV end diastolic pressure is mildly elevated.  The left ventricular ejection fraction is 55-65% by visual estimate.  There is no aortic valve stenosis.   Continue aspirin and Plavix for at least a year and likely beyond given how long of a stented segment exists in the LAD.  She needs risk factor modification including smoking cessation and weight loss.    _____________   History of Present Illness     Ms. Dulworth is a 36 y.o.female with known history of CAD, with PCI/DES to LAD in April  of 2017, with re-look cardiac cath in May of 2017 in the setting of recurrent chest pain.Cath revealed patent LAD stent and non-obstructive RCA and LCX disease. Post cath in May she had right arm edema and numbness. 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 She was not found to be candidate at that time for thrombectomy in the setting of recent  institution of [place]. Other history includes  Type II diabetes, tobacco abuse, hypertension, hyperlipidemia, and TR.    She was seen by Dr. Daneen Schick III on 10/23/2015, at which time the patient reports that he took her off her antihypertensive medication and isosorbide, furosemide, and carvedilol. No confirms this. Atorvastatin was to be discontinued in September 2015. Follow-up lipid panel was planned.  She presented to the emergency room after experiencing significantly elevated blood pressures. 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 She reports 160/120 with associated chest discomfort. The following night when she was working and she began to have some nausea but no vomiting, recurrent chest pain for which she took nitroglycerin 2 one hour apart. Blood pressure remained elevated and she presented to the emergency room for further evaluation.  On arrival the patient's blood pressure was 142/91, heart rate 103, O2 sat 100% on room air. She was found have elevated white blood cells at 10.7, glucose 176 creatinine 0.97. Troponin less than 0.03. Chest x-ray revealed no edema or consolidation. EKG revealed normal sinus rhythm, sinus tachycardia rate of 110 bpm. The patient was seen by the ED doctor who contacted cardiology. The patient has been seen by Dr. Johnsie Cancel in the ER. She was transferred to [facility] for further management and cardiac catheterization.   Hospital Course     Consultants: None  She underwent LHC with Dr. Irish Lack with DES placed to  dLAD, 2 patent stents noted to the p/mLAD, with 40% lesion to RCA and 50% lesion to m/d Lcx. Labs stable post procedure. Will be continued on DAPT with ASA and Plavix. Did have some groin bleeding post procedure with mild hematoma that resolved with pressure. No bruit noted. Also noted brief episodes of SVT, and her BB was changed from coreg to metoprolol. She was restarted on high  dose statin, and lisinopril added. Strongly encouraged smoking cessation throughout admission.   She was seen and assessed by Dr. Claiborne Billings on 04/03/16 and determined stable for discharge home. I have arranged for follow up in the office.  _____________  Discharge Vitals Blood pressure 103/60, pulse 68, temperature 98.1 F (36.7 C), temperature source Oral, resp. rate 14, height 5\' 9"  (1.753 m), weight 242 lb 8.1 oz (110 kg), SpO2 98 %.  Filed Weights   04/01/16 2208 04/02/16 0628 04/03/16 0236  Weight: 241 lb 8 oz (109.5 kg) 241 lb 10 oz (109.6 kg) 242 lb 8.1 oz (110 kg)    Labs & Radiologic Studies    CBC  Recent Labs  04/01/16 1110  04/02/16 1417 04/03/16 0200  WBC 10.7*  < > 7.2 8.0  NEUTROABS 7.4  --   --   --   HGB 12.6  < > 12.5 11.2*  HCT 37.1  < > 37.1 34.5*  MCV 90.9  < > 91.2 91.3  PLT 253  < > 208 212  < > = values in this interval not displayed. Basic Metabolic Panel  Recent Labs  04/01/16 1110  04/02/16 1417 04/03/16 0200  NA 138  --   --  137  K 3.5  --   --  3.7  CL 103  --   --  107  CO2 25  --   --  21*  GLUCOSE 176*  --   --  148*  BUN 15  --   --  7  CREATININE 0.97  < > 0.78 0.77  CALCIUM 9.6  --   --  8.8*  < > = values in this interval not displayed. Liver Function Tests No results for input(s): AST, ALT, ALKPHOS, BILITOT, PROT, ALBUMIN in the last 72 hours. No results for input(s): LIPASE, AMYLASE in the last 72 hours. Cardiac Enzymes  Recent Labs  04/01/16 1110 04/01/16 1415 04/01/16 1915  TROPONINI <0.03 <0.03 <0.03   BNP Invalid input(s): POCBNP D-Dimer No results for input(s): DDIMER in the last 72 hours. Hemoglobin A1C No results for input(s): HGBA1C in the last 72 hours. Fasting Lipid Panel No results for input(s): CHOL, HDL, LDLCALC, TRIG, CHOLHDL, LDLDIRECT in the last 72 hours. Thyroid Function Tests No results for input(s): TSH, T4TOTAL, T3FREE, THYROIDAB in the last 72 hours.  Invalid input(s): FREET3 _____________  Dg  Chest 2 View  Result Date: 04/01/2016 CLINICAL DATA:  Chest pain and hypertension EXAM: CHEST  2 VIEW COMPARISON:  October 02, 2015 FINDINGS: There is no edema or consolidation. Heart size and pulmonary vascularity are normal. No adenopathy. There is a stent in the left anterior descending coronary artery. No bone lesions. No pneumothorax. IMPRESSION: No edema or consolidation. Electronically Signed   By: Lowella Grip III M.D.   On: 04/01/2016 12:08   Disposition   Pt is being discharged home today in good condition.  Follow-up Plans & Appointments    Follow-up Information    Angelena Form, PA-C Follow up on 04/10/2016.   Specialties:  Cardiology, Radiology Why:  at [place] for your  follow up appt.  Contact information: [address] [place] [address] [phone]          Discharge Instructions    Amb Referral to Cardiac Rehabilitation    Complete by:  As directed    Diagnosis:   Coronary Stents PTCA     Call MD for:  redness, tenderness, or signs of infection (pain, swelling, redness, odor or green/yellow discharge around incision site)    Complete by:  As directed    Diet - low sodium heart healthy    Complete by:  As directed    Discharge instructions    Complete by:  As directed    Groin Site Care Refer to this sheet in the next few weeks. These instructions provide you with information on caring for yourself after your procedure. Your caregiver may also give you more specific instructions. Your treatment has been planned according to current medical practices, but problems sometimes occur. Call your caregiver if you have any problems or questions after your procedure. HOME CARE INSTRUCTIONS You may shower 24 hours after the procedure. Remove the bandage (dressing) and gently wash the site with plain soap and water. Gently pat the site dry.  Do not apply powder or lotion to the site.  Do not sit in a bathtub, swimming pool, or whirlpool for 5 to 7  days.  No bending, squatting, or lifting anything over 10 pounds (4.5 kg) as directed by your caregiver.  Inspect the site at least twice daily.  Do not drive home if you are discharged the same day of the procedure. Have someone else drive you.  You may drive 24 hours after the procedure unless otherwise instructed by your caregiver.  What to expect: Any bruising will usually fade within 1 to 2 weeks.  Blood that collects in the tissue (hematoma) may be painful to the touch. It should usually decrease in size and tenderness within 1 to 2 weeks.  SEEK IMMEDIATE MEDICAL CARE IF: You have unusual pain at the groin site or down the affected leg.  You have redness, warmth, swelling, or pain at the groin site.  You have drainage (other than a small amount of blood on the dressing).  You have chills.  You have a fever or persistent symptoms for more than 72 hours.  You have a fever and your symptoms suddenly get worse.  Your leg becomes pale, cool, tingly, or numb.  You have heavy bleeding from the site. Hold pressure on the site. .   Increase activity slowly    Complete by:  As directed       Discharge Medications   Current Discharge Medication List    START taking these medications   Details  atorvastatin (LIPITOR) 40 MG tablet Take 1 tablet (40 mg total) by mouth daily at 6 PM. Qty: 30 tablet, Refills: 6    lisinopril (PRINIVIL,ZESTRIL) 2.5 MG tablet Take 1 tablet (2.5 mg total) by mouth daily. Qty: 30 tablet, Refills: 6    metoprolol tartrate (LOPRESSOR) 50 MG tablet Take 1 tablet (50 mg total) by mouth 2 (two) times daily. Qty: 60 tablet, Refills: 6    pantoprazole (PROTONIX) 40 MG tablet Take 1 tablet (40 mg total) by mouth daily. Qty: 30 tablet, Refills: 6      CONTINUE these medications which have NOT CHANGED   Details  acetaminophen (TYLENOL) 500 MG tablet Take 500 mg by mouth every 6 (six) hours as needed for headache (pain).  albuterol (PROVENTIL HFA;VENTOLIN HFA)  108 (90 Base) MCG/ACT inhaler Inhale 2 puffs into the lungs every 6 (six) hours as needed for wheezing or shortness of breath. Qty: 1 Inhaler, Refills: 0    aspirin 81 MG EC tablet Take 1 tablet (81 mg total) by mouth daily. Qty: 30 tablet    budesonide-formoterol (SYMBICORT) 160-4.5 MCG/ACT inhaler Inhale 2 puffs into the lungs 2 (two) times daily. Qty: 1 Inhaler, Refills: 1    Cholecalciferol (VITAMIN D3) 50000 units CAPS Take 1 capsule by mouth once a week. Qty: 12 capsule, Refills: 1    clopidogrel (PLAVIX) 75 MG tablet Take 1 tablet (75 mg total) by mouth daily. Qty: 30 tablet, Refills: 11    ferrous gluconate (FERGON) 324 MG tablet Take 1 tablet (324 mg total) by mouth daily with breakfast. Qty: 90 tablet, Refills: 1    furosemide (LASIX) 20 MG tablet Take 1 tablet (20 mg total) by mouth daily. Qty: 30 tablet, Refills: 3   Associated Diagnoses: SOB (shortness of breath)    gabapentin (NEURONTIN) 300 MG capsule Take 1 capsule (300 mg total) by mouth 2 (two) times daily. Qty: 60 capsule, Refills: 2    metFORMIN (GLUCOPHAGE) 1000 MG tablet Take 1 tablet (1,000 mg total) by mouth 2 (two) times daily with a meal. Qty: 180 tablet, Refills: 0    nicotine (NICODERM CQ - DOSED IN MG/24 HOURS) 21 mg/24hr patch Place 1 patch (21 mg total) onto the skin daily. Qty: 28 patch, Refills: 0    nitroGLYCERIN (NITROSTAT) 0.4 MG SL tablet Place 1 tablet (0.4 mg total) under the tongue every 5 (five) minutes as needed for chest pain. Qty: 25 tablet, Refills: 3    Insulin Glargine (LANTUS SOLOSTAR) 100 UNIT/ML Solostar Pen Inject 10 Units into the skin daily at 10 pm. Qty: 5 pen, Refills: 2    Insulin Pen Needle (PEN NEEDLES) 32G X 4 MM MISC 1 each by Does not apply route daily. Use with Lantus pen Qty: 100 each, Refills: 5      STOP taking these medications     rosuvastatin (CRESTOR) 5 MG tablet      magnesium gluconate (MAGONATE) 30 MG tablet          Aspirin prescribed at  discharge?  Yes High Intensity Statin Prescribed? (Lipitor 40-80mg  or Crestor 20-40mg ): Yes Beta Blocker Prescribed? Yes For EF <40%, was ACEI/ARB Prescribed? Yes ADP Receptor Inhibitor Prescribed? (i.e. Plavix etc.-Includes Medically Managed Patients): Yes For EF <40%, Aldosterone Inhibitor Prescribed? No: EF ok Was EF assessed during THIS hospitalization? Yes Was Cardiac Rehab II ordered? (Included Medically managed Patients): Yes   Outstanding Labs/Studies   FLP and LFTs, BMET with addition of ACEi  Duration of Discharge Encounter   Greater than 30 minutes including physician time.  Signed, Reino Bellis NP-C 04/03/2016, 3:27 PM

## 2016-04-03 NOTE — Progress Notes (Signed)
CARDIAC REHAB PHASE I   PRE:  Rate/Rhythm: 68 SR    BP: sitting 150/84    SaO2:   MODE:  Ambulation: 500 ft   POST:  Rate/Rhythm: 78 SR    BP: sitting 151/87     SaO2:   Pt on bedrest initially for groin hematoma. In depth discussion of risk factor modification. She has made considerable change since last year. She is down 60 lbs, eating vegetables, exercising. DM seems controlled. She now plans to get off last 5 cigarettes and stop drinking sodas (which has been the hardest change for her diet). Good discussion of smoking cessation and continuing habit change. She understands Plavix/ASA. She seems to need reiteration to really get a grasp. Will send referral to [facility] however pt did not enjoy program and enjoys exercising on her own.   After education, pt's groin stable and she was able to walk 500 ft although groin was sore and she was limping. She is rather impulsive (getting up quickly, etc.). VSS. Groin still stable (RN checked). [place], ACSM 04/03/2016 9:17 AM

## 2016-04-07 ENCOUNTER — Ambulatory Visit: Payer: Self-pay | Admitting: *Deleted

## 2016-04-07 NOTE — Progress Notes (Signed)
Cardiology Office Note    Date:  04/10/2016   ID:  BRIGGS LYERLY, DOB Dec 17, 1980, MRN [id number]  PCP:  Crisoforo Oxford, PA-C  Cardiologist:  Dr. Tamala Julian   CC: post hospital follow up  History of Present Illness:  Jennifer Cervantes is a 36 y.o. female with a history of uncontrolled DM, HTN, morbid obesity, tobacco abuse, MR/TR and CAD s/p DES x2 to mLAD (05/2015) and DES to dLAD (03/2016) who presents to clinic for post hospital follow up.   She was admitted in 05/2015 for NSTEMI. She underwent LHC with DESx2 placed to mLAD. She had relook cath a couple days later for recurrent chest pain that showed patent stent and stable non obst CAD. Admission complicated by right radial artery occlusion and was seen by vascular surgery. This self resolved. She had ongoing atypical chest pain since that time.   Admitted in 06/2015 for recurrent chest pain and ruled out for MI. Lexiscan myoview was low risk.   Stopped Brillinta and switched to plavix in 09/2015 due to SOB, although this didn't really help.   She was seen by Dr. Daneen Schick III on 10/23/2015, at which time she was taken off  isosorbide, furosemide, carvedilol as well as atorvastatin for unclear reasons.   She was recently admitted from 2/6-04/03/16 for recurrent chest pain and HTN. She underwent LHC on 04/02/16 which showed patent LAD stents with 90% distal LAD lesion s/p DES overlapping previous stents. Will be continued on DAPT with ASA and Plavix. Noted brief episodes of SVT during admission and her BB was changed from coreg to Toprol XL. She was restarted on high dose statin and lisinopril added. Strongly encouraged smoking cessation throughout admission.   Today she presented to clinic for follow up. She generally does not feel well with low energy, SOB and chest pain. Chest pain feels like "little tiny stabs" in her chest. Worse in the AM. Has a burning sensation that goes into her nose. PPI does not seem to help. Worse with  exertion. She also feels like heart is pounding out her chest, this occurs at least 2x a day. She has had orthopnea and PND. No dizziness or syncope. No blood in stool or urine. She has quit smoking. She feels like she is going to drop dead before we can figure out what's wrong with her.    Past Medical History:  Diagnosis Date  . CAD (coronary artery disease)    a. LHC on 06/21/15 which showed 75% occl mid-dist LCx, 40% occl mRCA, 99% mid-dist LAD s/p DES and 50% occl mLAD s/p DES (overlapping stents) and significant LV dysfunction with anteroapical HK; b. 06/2015 Myoview: EF 55-65%, no ischemia/infarct.  . Diabetes mellitus without complication ([place])   . Hyperlipidemia   . Hypertension   . Myocardial stunning (HCC)    a. EF 45% on 2D ECHO on 06/21/15 and severe LV dysfunction on LV gram by cath. Repeat 2D ECHO with EF 55-60% and mild HK of the apicoseptal myocardium.  [name] Neuropraxia of right median nerve    a. suspected after right radial cath. will follow with neuro outpatient if does not resolve.   . NSTEMI (non-ST elevated myocardial infarction) [facility]) 05/2015   [facility]  . Obesity   . Right radial artery thrombus ([place])    a. 05/2015 Following PCI (radial access)-->conservatively managed.  . Tobacco abuse   . Tricuspid regurgitation   . Type II diabetes mellitus ([place])    a. Type II  Past Surgical History:  Procedure Laterality Date  . CARDIAC CATHETERIZATION N/A 06/21/2015   Procedure: Left Heart Cath and Coronary Angiography;  Surgeon: Belva Crome, MD; pLAD 99%, CFX 75%, RCA 40%, EF 35%   . CARDIAC CATHETERIZATION N/A 06/21/2015   Procedure: Coronary Stent Intervention;  Surgeon: Belva Crome, MD;  Promus Premier DES, 3.0 x 20 and 3.5 x 32 mm stents to the LAD  . CARDIAC CATHETERIZATION N/A 06/21/2015   Procedure: Intravascular Ultrasound/IVUS;  Surgeon: Belva Crome, MD;  Location: [place] [facility];  Service: Cardiovascular;  Laterality: N/A;  LAD  . CARDIAC CATHETERIZATION   06/22/2015   Procedure: Coronary/Graft Angiography;  Surgeon: Peter M Martinique, MD;  Location: [place] [facility];  Service: Cardiovascular;;  . CORONARY STENT INTERVENTION N/A 04/02/2016   Procedure: Coronary Stent Intervention;  Surgeon: Jettie Booze, MD;  Location: [place] [facility];  Service: Cardiovascular;  Laterality: N/A;  . LEFT HEART CATH AND CORONARY ANGIOGRAPHY N/A 04/02/2016   Procedure: Left Heart Cath and Coronary Angiography;  Surgeon: Jettie Booze, MD;  Location: [place] [facility];  Service: Cardiovascular;  Laterality: N/A;    Current Medications: Outpatient Medications Prior to Visit  Medication Sig Dispense Refill  . acetaminophen (TYLENOL) 500 MG tablet Take 500 mg by mouth every 6 (six) hours as needed for headache (pain).    [name] albuterol (PROVENTIL HFA;VENTOLIN HFA) 108 (90 Base) MCG/ACT inhaler Inhale 2 puffs into the lungs every 6 (six) hours as needed for wheezing or shortness of breath. 1 Inhaler 0  . aspirin 81 MG EC tablet Take 1 tablet (81 mg total) by mouth daily. 30 tablet   . atorvastatin (LIPITOR) 40 MG tablet Take 1 tablet (40 mg total) by mouth daily at 6 PM. 30 tablet 6  . budesonide-formoterol (SYMBICORT) 160-4.5 MCG/ACT inhaler Inhale 2 puffs into the lungs 2 (two) times daily. 1 Inhaler 1  . Cholecalciferol (VITAMIN D3) 50000 units CAPS Take 1 capsule by mouth once a week. (Patient taking differently: Take 1 capsule by mouth every Friday. ) 12 capsule 1  . clopidogrel (PLAVIX) 75 MG tablet Take 1 tablet (75 mg total) by mouth daily. 30 tablet 11  . ferrous gluconate (FERGON) 324 MG tablet Take 1 tablet (324 mg total) by mouth daily with breakfast. 90 tablet 1  . furosemide (LASIX) 20 MG tablet Take 1 tablet (20 mg total) by mouth daily. 30 tablet 3  . gabapentin (NEURONTIN) 300 MG capsule Take 1 capsule (300 mg total) by mouth 2 (two) times daily. 60 capsule 2  . Insulin Glargine (LANTUS SOLOSTAR) 100 UNIT/ML Solostar Pen Inject 10 Units into the  skin daily at 10 pm. 5 pen 2  . Insulin Pen Needle (PEN NEEDLES) 32G X 4 MM MISC 1 each by Does not apply route daily. Use with Lantus pen 100 each 5  . lisinopril (PRINIVIL,ZESTRIL) 2.5 MG tablet Take 1 tablet (2.5 mg total) by mouth daily. 30 tablet 6  . metFORMIN (GLUCOPHAGE) 1000 MG tablet Take 1 tablet (1,000 mg total) by mouth 2 (two) times daily with a meal. 180 tablet 0  . metoprolol tartrate (LOPRESSOR) 50 MG tablet Take 1 tablet (50 mg total) by mouth 2 (two) times daily. 60 tablet 6  . nicotine (NICODERM CQ - DOSED IN MG/24 HOURS) 21 mg/24hr patch Place 1 patch (21 mg total) onto the skin daily. 28 patch 0  . nitroGLYCERIN (NITROSTAT) 0.4 MG SL tablet Place 1 tablet (0.4 mg total) under the  tongue every 5 (five) minutes as needed for chest pain. 25 tablet 3  . pantoprazole (PROTONIX) 40 MG tablet Take 1 tablet (40 mg total) by mouth daily. 30 tablet 6   No facility-administered medications prior to visit.      Allergies:   Patient has no known allergies.   Social History   Social History  . Marital status: Single    Spouse name: N/A  . Number of children: 4  . Years of education: 12   Occupational History  . Nurse [employer]   Social History Main Topics  . Smoking status: Current Every Day Smoker    Packs/day: 0.50    Years: 23.00    Types: Cigarettes    Last attempt to quit: 06/21/2015  . Smokeless tobacco: Never Used  . Alcohol use No  . Drug use: No  . Sexual activity: Not Asked   Other Topics Concern  . None   Social History Narrative   Lives in [place], works nights at [employer]   Right-handed   Dexter 1 soda a day     Family History:  The patient's family history includes Healthy in her brother, father, mother, and sister.     ROS:   Please see the history of present illness.    ROS All other systems reviewed and are negative.   PHYSICAL EXAM:   VS:  BP 120/78   Pulse 90   Ht 5\' 9"  (1.753 m)   Wt 241 lb 6.4 oz (109.5 kg)   SpO2 97%   BMI 35.65  kg/m    GEN: Well nourished, well developed, in no acute distress, obese HEENT: normal  Neck: no JVD, carotid bruits, or masses Cardiac: RRR; no murmurs, rubs, or gallops,no edema  Respiratory:  clear to auscultation bilaterally, normal work of breathing GI: soft, nontender, nondistended, + BS MS: no deformity or atrophy  Skin: warm and dry, no rash Neuro:  Alert and Oriented x 3, Strength and sensation are intact Psych: euthymic mood, full affect  Wt Readings from Last 3 Encounters:  04/10/16 241 lb 6.4 oz (109.5 kg)  04/03/16 242 lb 8.1 oz (110 kg)  10/23/15 227 lb (103 kg)      Studies/Labs Reviewed:   EKG:  EKG is ordered today.  The ekg ordered today demonstrates NSR , HR 87  Recent Labs: 06/21/2015: TSH 0.184 01/25/2016: ALT 10 04/03/2016: BUN 7; Creatinine, Ser 0.77; Hemoglobin 11.2; Platelets 212; Potassium 3.7; Sodium 137   Lipid Panel    Component Value Date/Time   CHOL 140 01/25/2016 1015   TRIG 63 01/25/2016 1015   HDL 43 (L) 01/25/2016 1015   CHOLHDL 3.3 01/25/2016 1015   VLDL 13 01/25/2016 1015   LDLCALC 84 01/25/2016 1015    Additional studies/ records that were reviewed today include:  LHC: 04/02/16   Patent prior stents in the LAD.  Mid RCA lesion, 40 %stenosed.  Mid Cx to Dist Cx lesion, 50 %stenosed. This lesion appears improved compared to prior cath.  Prox LAD lesion, 30 %stenosed, just at the proximal edge of the previously stented segment.  1st Mrg lesion, 25 %stenosed.  Dist LAD lesion, 90 %stenosed. A STENT PROMUS PREM MR 3.0X24 drug eluting stent was successfully placed, and overlaps previously placed stent, postdilated to 3.3 mm.  Post intervention, there is a 0% residual stenosis.  The left ventricular systolic function is normal.  LV end diastolic pressure is mildly elevated.  The left ventricular ejection fraction is 55-65% by visual  estimate.  There is no aortic valve stenosis.  Continue aspirin and Plavix for at least a  year and likely beyond given how long of a stented segment exists in the LAD. She needs risk factor modification including smoking cessation and weight loss.     2D ECHO: 06/25/2015 LV EF: 55% - 60% Study Conclusions - Left ventricle: The cavity size was normal. Systolic function was normal. The estimated ejection fraction was in the range of 55%  to 60%. Very mild hypokinesis of the apicoseptal myocardium. Left  ventricular diastolic function parameters were normal. - Mitral valve: There was mild to moderate regurgitation directed centrally. - Left atrium: The atrium was mildly dilated. - Tricuspid valve: There was mild-moderate regurgitation directed centrally. - Pericardium, extracardiac: A trivial pericardial effusion was identified.    ASSESSMENT & PLAN:   CAD: s/p DES x2 to mLAD in 05/2015 and DES to dLAD in 03/2016. Still having burning atypical chest pain. This has been going on since before last heart cath and has continued despite recent stenting.  -- Continue ASA and plavix, statin and BB.   Mitral/Tricuspid regurgitation: mild-mod regurgitation directed centrally. Follow clinically. No s/s CHF. Still very SOB, will update 2D ECHO.   DMT2: uncontrolled. HGA1c ~9. Continue home Metformin.   Morbid obesity: Body mass index is 35.65 kg/m. diet and exercise recommended.  Tobacco abuse:  She has quit.  Chronic diastolic CHF: currently on lasix 20mg  daily. Still SOB and had mildly elevated LVEDP on most recent cath. She doesn't appear volume overloaded today. Will check a BNP.   Palpitations: will place a 30 day event monitor. She does have known SVT.   HTN: BP well controlled today    Medication Adjustments/Labs and Tests Ordered: Current medicines are reviewed at length with the patient today.  Concerns regarding medicines are outlined above.  Medication changes, Labs and Tests ordered today are listed in the Patient Instructions below. Patient Instructions    Medication Instructions:  Your physician recommends that you continue on your current medications as directed. Please refer to the Current Medication list given to you today.   Labwork: TODAY:  BMET, PRO BNP, & CBC  Testing/Procedures: Your physician has requested that you have an echocardiogram. Echocardiography is a painless test that uses sound waves to create images of your heart. It provides your doctor with information about the size and shape of your heart and how well your heart's chambers and valves are working. This procedure takes approximately one hour. There are no restrictions for this procedure.  Your physician has recommended that you wear an event monitor. Event monitors are medical devices that record the heart's electrical activity. Doctors most often [place] these monitors to diagnose arrhythmias. Arrhythmias are problems with the speed or rhythm of the heartbeat. The monitor is a small, portable device. You can wear one while you do your normal daily activities. This is usually used to diagnose what is causing palpitations/syncope (passing out).    Follow-Up: Your physician recommends that you schedule a follow-up appointment in: [place]   Any Other Special Instructions Will Be Listed Below (If Applicable). Echocardiogram An echocardiogram, or echocardiography, uses sound waves (ultrasound) to produce an image of your heart. The echocardiogram is simple, painless, obtained within a short period of time, and offers valuable information to your health care provider. The images from an echocardiogram can provide information such as:  Evidence of coronary artery disease (CAD).  Heart size.  Heart muscle function.  Heart  valve function.  Aneurysm detection.  Evidence of a past heart attack.  Fluid buildup around the heart.  Heart muscle thickening.  Assess heart valve function. Tell a health care provider about:  Any allergies you  have.  All medicines you are taking, including vitamins, herbs, eye drops, creams, and over-the-counter medicines.  Any problems you or family members have had with anesthetic medicines.  Any blood disorders you have.  Any surgeries you have had.  Any medical conditions you have.  Whether you are pregnant or may be pregnant. What happens before the procedure? No special preparation is needed. Eat and drink normally. What happens during the procedure?  In order to produce an image of your heart, gel will be applied to your chest and a wand-like tool (transducer) will be moved over your chest. The gel will help transmit the sound waves from the transducer. The sound waves will harmlessly bounce off your heart to allow the heart images to be captured in real-time motion. These images will then be recorded.  You may need an IV to receive a medicine that improves the quality of the pictures. What happens after the procedure? You may return to your normal schedule including diet, activities, and medicines, unless your health care provider tells you otherwise. This information is not intended to replace advice given to you by your health care provider. Make sure you discuss any questions you have with your health care provider. Document Released: 02/08/2000 Document Revised: 09/29/2015 Document Reviewed: 10/18/2012 Elsevier Interactive Patient Education  2017 [place].    Cardiac Event Monitoring A cardiac event monitor is a small recording device used to help detect abnormal heart rhythms (arrhythmias). The monitor is used to record heart rhythm when noticeable symptoms such as the following occur:  Fast heartbeats (palpitations), such as heart racing or fluttering.  Dizziness.  Fainting or light-headedness.  Unexplained weakness. The monitor is wired to two electrodes placed on your chest. Electrodes are flat, sticky disks that attach to your skin. The monitor can be worn for up  to 30 days. You will wear the monitor at all times, except when bathing. How to use your cardiac event monitor A technician will prepare your chest for the electrode placement. The technician will show you how to place the electrodes, how to work the monitor, and how to replace the batteries. Take time to practice using the monitor before you leave the office. Make sure you understand how to send the information from the monitor to your health care provider. This requires a telephone with a landline, not a cell phone. You need to:  Wear your monitor at all times, except when you are in water:  Do not get the monitor wet.  Take the monitor off when bathing. Do not swim or use a hot tub with it on.  Keep your skin clean. Do not put body lotion or moisturizer on your chest.  Change the electrodes daily or any time they stop sticking to your skin. You might need to use tape to keep them on.  It is possible that your skin under the electrodes could become irritated. To keep this from happening, try to put the electrodes in slightly different places on your chest. However, they must remain in the area under your left breast and in the upper right section of your chest.  Make sure the monitor is safely clipped to your clothing or in a location close to your body that your health care provider recommends.  Press the button to record when you feel symptoms of heart trouble, such as dizziness, weakness, light-headedness, palpitations, thumping, shortness of breath, unexplained weakness, or a fluttering or racing heart. The monitor is always on and records what happened slightly before you pressed the button, so do not worry about being too late to get good information.  Keep a diary of your activities, such as walking, doing chores, and taking medicine. It is especially important to note what you were doing when you pushed the button to record your symptoms. This will help your health care provider  determine what might be contributing to your symptoms. The information stored in your monitor will be reviewed by your health care provider alongside your diary entries.  Send the recorded information as recommended by your health care provider. It is important to understand that it will take some time for your health care provider to process the results.  Change the batteries as recommended by your health care provider. Get help right away if:  You have chest pain.  You have extreme difficulty breathing or shortness of breath.  You develop a very fast heartbeat that persists.  You develop dizziness that does not go away.  You faint or constantly feel you are about to faint. This information is not intended to replace advice given to you by your health care provider. Make sure you discuss any questions you have with your health care provider. Document Released: 11/20/2007 Document Revised: 07/19/2015 Document Reviewed: 08/09/2012 Elsevier Interactive Patient Education  2017 [employer].   If you need a refill on your cardiac medications before your next appointment, please call your pharmacy.      Signed, Angelena Form, PA-C  04/10/2016 9:31 AM    [facility] [address], [place], [place]  [address] Phone: [phone]; Fax: [phone]

## 2016-04-10 ENCOUNTER — Other Ambulatory Visit: Payer: Self-pay

## 2016-04-10 ENCOUNTER — Encounter: Payer: Self-pay | Admitting: *Deleted

## 2016-04-10 ENCOUNTER — Ambulatory Visit (HOSPITAL_COMMUNITY): Payer: 59 | Attending: Physician Assistant

## 2016-04-10 ENCOUNTER — Encounter: Payer: Self-pay | Admitting: Physician Assistant

## 2016-04-10 ENCOUNTER — Ambulatory Visit (INDEPENDENT_AMBULATORY_CARE_PROVIDER_SITE_OTHER): Payer: 59 | Admitting: Physician Assistant

## 2016-04-10 VITALS — BP 120/78 | HR 90 | Ht 69.0 in | Wt 241.4 lb

## 2016-04-10 DIAGNOSIS — I071 Rheumatic tricuspid insufficiency: Secondary | ICD-10-CM | POA: Diagnosis not present

## 2016-04-10 DIAGNOSIS — I25119 Atherosclerotic heart disease of native coronary artery with unspecified angina pectoris: Secondary | ICD-10-CM

## 2016-04-10 DIAGNOSIS — R0602 Shortness of breath: Secondary | ICD-10-CM | POA: Diagnosis not present

## 2016-04-10 DIAGNOSIS — E785 Hyperlipidemia, unspecified: Secondary | ICD-10-CM

## 2016-04-10 DIAGNOSIS — I2510 Atherosclerotic heart disease of native coronary artery without angina pectoris: Secondary | ICD-10-CM | POA: Diagnosis not present

## 2016-04-10 DIAGNOSIS — Z720 Tobacco use: Secondary | ICD-10-CM | POA: Diagnosis not present

## 2016-04-10 DIAGNOSIS — E118 Type 2 diabetes mellitus with unspecified complications: Secondary | ICD-10-CM

## 2016-04-10 DIAGNOSIS — R002 Palpitations: Secondary | ICD-10-CM

## 2016-04-10 LAB — BASIC METABOLIC PANEL
BUN / CREAT RATIO: 9 (ref 9–23)
BUN: 8 mg/dL (ref 6–20)
CALCIUM: 9.9 mg/dL (ref 8.7–10.2)
CO2: 23 mmol/L (ref 18–29)
CREATININE: 0.88 mg/dL (ref 0.57–1.00)
Chloride: 95 mmol/L — ABNORMAL LOW (ref 96–106)
GFR calc non Af Amer: 84 mL/min/{1.73_m2} (ref >59)
GFR, EST AFRICAN AMERICAN: 97 mL/min/{1.73_m2} (ref >59)
GLUCOSE: 186 mg/dL — AB (ref 65–99)
Potassium: 4.1 mmol/L (ref 3.5–5.2)
Sodium: 137 mmol/L (ref 134–144)

## 2016-04-10 LAB — CBC
HEMATOCRIT: 39.3 % (ref 34.0–46.6)
HEMOGLOBIN: 13.5 g/dL (ref 11.1–15.9)
MCH: 31.3 pg (ref 26.6–33.0)
MCHC: 34.4 g/dL (ref 31.5–35.7)
MCV: 91 fL (ref 79–97)
Platelets: 263 10*3/uL (ref 150–379)
RBC: 4.31 x10E6/uL (ref 3.77–5.28)
RDW: 13.3 % (ref 12.3–15.4)
WBC: 8.7 10*3/uL (ref 3.4–10.8)

## 2016-04-10 LAB — ECHOCARDIOGRAM COMPLETE
HEIGHTINCHES: 69 in
Weight: 3862.4 oz

## 2016-04-10 LAB — PRO B NATRIURETIC PEPTIDE: NT-PRO BNP: 21 pg/mL (ref 0–130)

## 2016-04-10 NOTE — Patient Instructions (Addendum)
Medication Instructions:  Your physician recommends that you continue on your current medications as directed. Please refer to the Current Medication list given to you today.   Labwork: TODAY:  BMET, PRO BNP, & CBC  Testing/Procedures: Your physician has requested that you have an echocardiogram. Echocardiography is a painless test that uses sound waves to create images of your heart. It provides your doctor with information about the size and shape of your heart and how well your heart's chambers and valves are working. This procedure takes approximately one hour. There are no restrictions for this procedure.  Your physician has recommended that you wear an event monitor. Event monitors are medical devices that record the heart's electrical activity. Doctors most often [place] these monitors to diagnose arrhythmias. Arrhythmias are problems with the speed or rhythm of the heartbeat. The monitor is a small, portable device. You can wear one while you do your normal daily activities. This is usually used to diagnose what is causing palpitations/syncope (passing out).    Follow-Up: Your physician recommends that you schedule a follow-up appointment in: [place]   Any Other Special Instructions Will Be Listed Below (If Applicable). Echocardiogram An echocardiogram, or echocardiography, uses sound waves (ultrasound) to produce an image of your heart. The echocardiogram is simple, painless, obtained within a short period of time, and offers valuable information to your health care provider. The images from an echocardiogram can provide information such as:  Evidence of coronary artery disease (CAD).  Heart size.  Heart muscle function.  Heart valve function.  Aneurysm detection.  Evidence of a past heart attack.  Fluid buildup around the heart.  Heart muscle thickening.  Assess heart valve function. Tell a health care provider about:  Any allergies you have.  All  medicines you are taking, including vitamins, herbs, eye drops, creams, and over-the-counter medicines.  Any problems you or family members have had with anesthetic medicines.  Any blood disorders you have.  Any surgeries you have had.  Any medical conditions you have.  Whether you are pregnant or may be pregnant. What happens before the procedure? No special preparation is needed. Eat and drink normally. What happens during the procedure?  In order to produce an image of your heart, gel will be applied to your chest and a wand-like tool (transducer) will be moved over your chest. The gel will help transmit the sound waves from the transducer. The sound waves will harmlessly bounce off your heart to allow the heart images to be captured in real-time motion. These images will then be recorded.  You may need an IV to receive a medicine that improves the quality of the pictures. What happens after the procedure? You may return to your normal schedule including diet, activities, and medicines, unless your health care provider tells you otherwise. This information is not intended to replace advice given to you by your health care provider. Make sure you discuss any questions you have with your health care provider. Document Released: 02/08/2000 Document Revised: 09/29/2015 Document Reviewed: 10/18/2012 Elsevier Interactive Patient Education  2017 [employer].    Cardiac Event Monitoring A cardiac event monitor is a small recording device used to help detect abnormal heart rhythms (arrhythmias). The monitor is used to record heart rhythm when noticeable symptoms such as the following occur:  Fast heartbeats (palpitations), such as heart racing or fluttering.  Dizziness.  Fainting or light-headedness.  Unexplained weakness. The monitor is wired to two electrodes placed on your chest. Electrodes  are flat, sticky disks that attach to your skin. The monitor can be worn for up to 30 days.  You will wear the monitor at all times, except when bathing. How to use your cardiac event monitor A technician will prepare your chest for the electrode placement. The technician will show you how to place the electrodes, how to work the monitor, and how to replace the batteries. Take time to practice using the monitor before you leave the office. Make sure you understand how to send the information from the monitor to your health care provider. This requires a telephone with a landline, not a cell phone. You need to:  Wear your monitor at all times, except when you are in water:  Do not get the monitor wet.  Take the monitor off when bathing. Do not swim or use a hot tub with it on.  Keep your skin clean. Do not put body lotion or moisturizer on your chest.  Change the electrodes daily or any time they stop sticking to your skin. You might need to use tape to keep them on.  It is possible that your skin under the electrodes could become irritated. To keep this from happening, try to put the electrodes in slightly different places on your chest. However, they must remain in the area under your left breast and in the upper right section of your chest.  Make sure the monitor is safely clipped to your clothing or in a location close to your body that your health care provider recommends.  Press the button to record when you feel symptoms of heart trouble, such as dizziness, weakness, light-headedness, palpitations, thumping, shortness of breath, unexplained weakness, or a fluttering or racing heart. The monitor is always on and records what happened slightly before you pressed the button, so do not worry about being too late to get good information.  Keep a diary of your activities, such as walking, doing chores, and taking medicine. It is especially important to note what you were doing when you pushed the button to record your symptoms. This will help your health care provider determine what  might be contributing to your symptoms. The information stored in your monitor will be reviewed by your health care provider alongside your diary entries.  Send the recorded information as recommended by your health care provider. It is important to understand that it will take some time for your health care provider to process the results.  Change the batteries as recommended by your health care provider. Get help right away if:  You have chest pain.  You have extreme difficulty breathing or shortness of breath.  You develop a very fast heartbeat that persists.  You develop dizziness that does not go away.  You faint or constantly feel you are about to faint. This information is not intended to replace advice given to you by your health care provider. Make sure you discuss any questions you have with your health care provider. Document Released: 11/20/2007 Document Revised: 07/19/2015 Document Reviewed: 08/09/2012 Elsevier Interactive Patient Education  2017 [employer].   If you need a refill on your cardiac medications before your next appointment, please call your pharmacy.

## 2016-04-15 ENCOUNTER — Ambulatory Visit (INDEPENDENT_AMBULATORY_CARE_PROVIDER_SITE_OTHER): Payer: 59

## 2016-04-15 ENCOUNTER — Encounter: Payer: Self-pay | Admitting: Medical

## 2016-04-15 ENCOUNTER — Ambulatory Visit (INDEPENDENT_AMBULATORY_CARE_PROVIDER_SITE_OTHER): Payer: 59 | Admitting: Medical

## 2016-04-15 VITALS — BP 116/82 | HR 76 | Ht 69.0 in | Wt 239.0 lb

## 2016-04-15 DIAGNOSIS — Z0000 Encounter for general adult medical examination without abnormal findings: Secondary | ICD-10-CM

## 2016-04-15 DIAGNOSIS — Z720 Tobacco use: Secondary | ICD-10-CM

## 2016-04-15 DIAGNOSIS — I25119 Atherosclerotic heart disease of native coronary artery with unspecified angina pectoris: Secondary | ICD-10-CM | POA: Diagnosis not present

## 2016-04-15 DIAGNOSIS — I071 Rheumatic tricuspid insufficiency: Secondary | ICD-10-CM | POA: Diagnosis not present

## 2016-04-15 DIAGNOSIS — E118 Type 2 diabetes mellitus with unspecified complications: Secondary | ICD-10-CM

## 2016-04-15 DIAGNOSIS — R0609 Other forms of dyspnea: Secondary | ICD-10-CM | POA: Diagnosis not present

## 2016-04-15 DIAGNOSIS — I742 Embolism and thrombosis of arteries of the upper extremities: Secondary | ICD-10-CM | POA: Diagnosis not present

## 2016-04-15 DIAGNOSIS — R946 Abnormal results of thyroid function studies: Secondary | ICD-10-CM

## 2016-04-15 DIAGNOSIS — E785 Hyperlipidemia, unspecified: Secondary | ICD-10-CM | POA: Diagnosis not present

## 2016-04-15 DIAGNOSIS — I214 Non-ST elevation (NSTEMI) myocardial infarction: Secondary | ICD-10-CM

## 2016-04-15 DIAGNOSIS — Z129 Encounter for screening for malignant neoplasm, site unspecified: Secondary | ICD-10-CM | POA: Insufficient documentation

## 2016-04-15 DIAGNOSIS — I5032 Chronic diastolic (congestive) heart failure: Secondary | ICD-10-CM

## 2016-04-15 DIAGNOSIS — D539 Nutritional anemia, unspecified: Secondary | ICD-10-CM | POA: Diagnosis not present

## 2016-04-15 DIAGNOSIS — I252 Old myocardial infarction: Secondary | ICD-10-CM

## 2016-04-15 DIAGNOSIS — R002 Palpitations: Secondary | ICD-10-CM

## 2016-04-15 DIAGNOSIS — R0600 Dyspnea, unspecified: Secondary | ICD-10-CM

## 2016-04-15 LAB — IRON: Iron: 108 ug/dL (ref 40–190)

## 2016-04-15 LAB — TSH: TSH: 0.56 m[IU]/L

## 2016-04-15 LAB — FERRITIN: FERRITIN: 48 ng/mL (ref 10–154)

## 2016-04-15 LAB — T4, FREE: FREE T4: 1.1 ng/dL (ref 0.8–1.8)

## 2016-04-15 NOTE — Progress Notes (Signed)
Subjective: Chief Complaint  Jennifer Cervantes presents with  . physical    physical, no concerns    Here accompanied by her mother.  Medical team:  Dr. Tamala Julian, cardiology  Dr. Margette Fast, neurology  Gynecology  [facility], [place], [place]  Dentist in [place], [place], [place] here for primary care  Last Td unsure.    No prior pneumococcal vaccine Has had hep B series Up to date on flu shot UTD on mammogram UTD on pap smear No prior colonoscopy   Concerns: Works as Quarry manager for [employer].  Exercise - nothing.  Diet - so so.    Having some right side pain.  Quit tobacco 04/05/16.  Anemia - taking iron once daily.   Taking Vit D weekly.   LMP 01/2016, had recent ablation.    Had recent hospitalization for heart issues.  Diabetes - using lantus 10 QHS, metformin 1000mg  BID  Just recently restarted statin with recent hospitalization  COPD - Using Symbicort 2 BID, not using albuterol much  Past Medical History:  Diagnosis Date  . CAD (coronary artery disease)    a. LHC on 06/21/15 which showed 75% occl mid-dist LCx, 40% occl mRCA, 99% mid-dist LAD s/p DES and 50% occl mLAD s/p DES (overlapping stents) and significant LV dysfunction with anteroapical HK; b. 06/2015 Myoview: EF 55-65%, no ischemia/infarct.  . Diabetes mellitus without complication ([place])   . Hyperlipidemia   . Hypertension   . Myocardial stunning (HCC)    a. EF 45% on 2D ECHO on 06/21/15 and severe LV dysfunction on LV gram by cath. Repeat 2D ECHO with EF 55-60% and mild HK of the apicoseptal myocardium.  [name] Neuropraxia of right median nerve    a. suspected after right radial cath. will follow with neuro outpatient if does not resolve.   . NSTEMI (non-ST elevated myocardial infarction) [facility]) 05/2015   [facility]  . Obesity   . 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 05/2015 Following PCI (radial access)-->conservatively managed.  . Tobacco abuse   . Tricuspid regurgitation   . Type II diabetes  mellitus ([place])    a. Type II    Past Surgical History:  Procedure Laterality Date  . CARDIAC CATHETERIZATION N/A 06/21/2015   Procedure: Left Heart Cath and Coronary Angiography;  Surgeon: Belva Crome, MD; pLAD 99%, CFX 75%, RCA 40%, EF 35%   . CARDIAC CATHETERIZATION N/A 06/21/2015   Procedure: Coronary Stent Intervention;  Surgeon: Belva Crome, MD;  Promus Premier DES, 3.0 x 20 and 3.5 x 32 mm stents to the LAD  . CARDIAC CATHETERIZATION N/A 06/21/2015   Procedure: Intravascular Ultrasound/IVUS;  Surgeon: Belva Crome, MD;  Location: [facility];  Service: Cardiovascular;  Laterality: N/A;  LAD  . CARDIAC CATHETERIZATION  06/22/2015   Procedure: Coronary/Graft Angiography;  Surgeon: Peter M Martinique, MD;  Location: [facility];  Service: Cardiovascular;;  . CORONARY STENT INTERVENTION N/A 04/02/2016   Procedure: Coronary Stent Intervention;  Surgeon: Jettie Booze, MD;  Location: [facility];  Service: Cardiovascular;  Laterality: N/A;  . LEFT HEART CATH AND CORONARY ANGIOGRAPHY N/A 04/02/2016   Procedure: Left Heart Cath and Coronary Angiography;  Surgeon: Jettie Booze, MD;  Location: [facility];  Service: Cardiovascular;  Laterality: N/A;    Social History   Social History  . Marital status: Single    Spouse name: N/A  . Number of children: 4  . Years of  education: 12   Occupational History  . Nurse [employer]   Social History Main Topics  . Smoking status: Current Every Day Smoker    Packs/day: 0.50    Years: 23.00    Types: Cigarettes    Last attempt to quit: 06/21/2015  . Smokeless tobacco: Never Used  . Alcohol use No  . Drug use: No  . Sexual activity: Not on file   Other Topics Concern  . Not on file   Social History Narrative   Lives in [place], works nights at [employer], [place]   Right-handed   Drinks 1 soda a day   Exercises some with walking   Lives with her 77 yo son   Likes her space, doesn't want a significant other    03/2016    Family History  Problem Relation Age of Onset  . Healthy Mother   . Healthy Father   . Healthy Sister   . Healthy Brother   . Heart attack Neg Hx   . Heart failure Neg Hx   . Heart disease Neg Hx   . Stroke Neg Hx   . Hyperlipidemia Neg Hx   . Hypertension Neg Hx      Current Outpatient Prescriptions:  .  acetaminophen (TYLENOL) 500 MG tablet, Take 500 mg by mouth every 6 (six) hours as needed for headache (pain)., Disp: , Rfl:  .  Cholecalciferol (VITAMIN D3) 50000 units CAPS, Take 1 capsule by mouth once a week. (Jennifer Cervantes taking differently: Take 1 capsule by mouth every Friday. ), Disp: 12 capsule, Rfl: 1 .  Insulin Pen Needle (PEN NEEDLES) 32G X 4 MM MISC, 1 each by Does not apply route daily. Use with Lantus pen, Disp: 100 each, Rfl: 5 .  lisinopril (PRINIVIL,ZESTRIL) 2.5 MG tablet, Take 1 tablet (2.5 mg total) by mouth daily., Disp: 30 tablet, Rfl: 6 .  nicotine (NICODERM CQ - DOSED IN MG/24 HOURS) 21 mg/24hr patch, Place 1 patch (21 mg total) onto the skin daily., Disp: 28 patch, Rfl: 0 .  nitroGLYCERIN (NITROSTAT) 0.4 MG SL tablet, Place 1 tablet (0.4 mg total) under the tongue every 5 (five) minutes as needed for chest pain., Disp: 25 tablet, Rfl: 3 .  pantoprazole (PROTONIX) 40 MG tablet, Take 1 tablet (40 mg total) by mouth daily., Disp: 30 tablet, Rfl: 6 .  albuterol (PROVENTIL HFA;VENTOLIN HFA) 108 (90 Base) MCG/ACT inhaler, Inhale 2 puffs into the lungs every 6 (six) hours as needed for wheezing or shortness of breath., Disp: 1 Inhaler, Rfl: 0 .  aspirin 81 MG EC tablet, Take 1 tablet (81 mg total) by mouth daily., Disp: 90 tablet, Rfl: 3 .  atorvastatin (LIPITOR) 40 MG tablet, Take 1 tablet (40 mg total) by mouth daily at 6 PM., Disp: 90 tablet, Rfl: 3 .  budesonide-formoterol (SYMBICORT) 160-4.5 MCG/ACT inhaler, Inhale 2 puffs into the lungs 2 (two) times daily., Disp: 1 Inhaler, Rfl: 11 .  clopidogrel (PLAVIX) 75 MG tablet, Take 1 tablet (75 mg total) by  mouth daily., Disp: 90 tablet, Rfl: 3 .  ferrous gluconate (FERGON) 324 MG tablet, Take 1 tablet (324 mg total) by mouth daily with breakfast., Disp: 90 tablet, Rfl: 1 .  furosemide (LASIX) 20 MG tablet, Take 1 tablet (20 mg total) by mouth daily., Disp: 90 tablet, Rfl: 3 .  Insulin Glargine (LANTUS SOLOSTAR) 100 UNIT/ML Solostar Pen, Inject 10 Units into the skin daily at 10 pm., Disp: 5 pen, Rfl: 5 .  metFORMIN (GLUCOPHAGE) 1000 MG tablet,  Take 1 tablet (1,000 mg total) by mouth 2 (two) times daily with a meal., Disp: 180 tablet, Rfl: 3 .  metoprolol (LOPRESSOR) 50 MG tablet, Take 1 tablet (50 mg total) by mouth 2 (two) times daily., Disp: 180 tablet, Rfl: 1 .  Vitamin D, Ergocalciferol, (DRISDOL) 50000 units CAPS capsule, Take 1 capsule (50,000 Units total) by mouth every 7 (seven) days., Disp: 12 capsule, Rfl: 3  No Known Allergies  Reviewed their medical, surgical, family, social, medication, and allergy history and updated chart as appropriate.  Review of Systems Constitutional: -fever, -chills, -sweats, -unexpected weight change, -decreased appetite, -fatigue Allergy: -sneezing, -itching, -congestion Dermatology: -changing moles, --rash, -lumps ENT: -runny nose, -ear pain, -sore throat, -hoarseness, -sinus pain, -teeth pain, - ringing in ears, -hearing loss, -nosebleeds Cardiology: -chest pain, -palpitations, -swelling, -difficulty breathing when lying flat, -waking up short of breath Respiratory: -cough, -shortness of breath, -difficulty breathing with exercise or exertion, -wheezing, -coughing up blood Gastroenterology: -abdominal pain, -nausea, -vomiting, -diarrhea, -constipation, -blood in stool, -changes in bowel movement, -difficulty swallowing or eating Hematology: -bleeding, -bruising  Musculoskeletal: -joint aches, -muscle aches, -joint swelling, -back pain, -neck pain, -cramping, -changes in gait Ophthalmology: denies vision changes, eye redness, itching, discharge Urology:  -burning with urination, -difficulty urinating, -blood in urine, -urinary frequency, -urgency, -incontinence Neurology: -headache, -weakness, -tingling, -numbness, -memory loss, -falls, -dizziness Psychology: -depressed mood, -agitation, -sleep problems     Objective:   Physical Exam  BP 116/82   Pulse 76   Ht 5\' 9"  (1.753 m)   Wt 239 lb (108.4 kg)   SpO2 98%   BMI 35.29 kg/m   Wt Readings from Last 3 Encounters:  04/15/16 239 lb (108.4 kg)  04/10/16 241 lb 6.4 oz (109.5 kg)  04/03/16 242 lb 8.1 oz (110 kg)   General appearance: alert, no distress, WD/WN, AA female Skin: unremarkable, no worrisome lesions HEENT: normocephalic, conjunctiva/corneas normal, sclerae anicteric, PERRLA, EOMi, nares patent, no discharge or erythema, pharynx normal Oral cavity: MMM, tongue normal, teeth normal Neck: supple, no lymphadenopathy, no thyromegaly, no masses, normal ROM, no bruits Chest: non tender, normal shape and expansion Heart: RRR, normal S1, S2, no murmurs  Lungs: CTA bilaterally, no wheezes, rhonchi, or rales Abdomen: +bs, soft, non tender, non distended, no masses, no hepatomegaly, no splenomegaly, no bruits Back: non tender, normal ROM, no scoliosis Musculoskeletal: upper extremities non tender, no obvious deformity, normal ROM throughout, lower extremities non tender, no obvious deformity, normal ROM throughout Extremities: no edema, no cyanosis, no clubbing Pulses: 2+ symmetric, upper and lower extremities, normal cap refill Neurological: alert, oriented x 3, CN2-12 intact, strength normal upper extremities and lower extremities, sensation normal throughout, DTRs 2+ throughout, no cerebellar signs, gait normal Psychiatric: normal affect, behavior normal, pleasant  Breast/gyn/rectal - deferred to gyn   Assessment and Plan :    Encounter Diagnoses  Name Primary?  . Encounter for health maintenance examination in adult Yes  . Coronary artery disease involving native coronary  artery of native heart with angina pectoris ([place])   . Chronic diastolic heart failure ([place])   . Non-ST elevation MI (NSTEMI) ([place])   . 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 Tricuspid valve insufficiency, unspecified etiology   . Type 2 diabetes mellitus with complication, without long-term current use of insulin ([place])   . History of MI (myocardial infarction)   . Hyperlipidemia, unspecified hyperlipidemia type   . Deficiency anemia   . DOE (dyspnea on exertion)   . Abnormal thyroid function test   .  Tobacco abuse     Physical exam - discussed healthy lifestyle, diet, exercise, preventative care, vaccinations, and addressed their concerns.  Handout given. See your eye doctor yearly for routine vision care. See your dentist yearly for routine dental care including hygiene visits twice yearly. See your gynecologist yearly for routine gynecological care. Reviewed recent cardiology notes, hospitalization notes Additional labs today after reviewed recent labs in the hospital Counseled on tobacco use but she is not willing to quit counseled on vaccines, but she declines recommendations Overall discussed the need to take better care of herself, quit smoking, quit soda.  Follow-up pending labs  Litzy was seen today for physical.  Diagnoses and all orders for this visit:  Encounter for health maintenance examination in adult -     Hemoglobin A1c -     TSH -     T4, free -     VITAMIN D 25 Hydroxy (Vit-D Deficiency, Fractures) -     Iron -     Ferritin  Coronary artery disease involving native coronary artery of native heart with angina pectoris (HCC)  Chronic diastolic heart failure (HCC)  Non-ST elevation MI (NSTEMI) ([place])  Right radial artery thrombus (HCC)  Tricuspid valve insufficiency, unspecified etiology  Type 2 diabetes mellitus with complication, without long-term current use of insulin (HCC) -     Hemoglobin A1c  History of MI (myocardial  infarction)  Hyperlipidemia, unspecified hyperlipidemia type  Deficiency anemia -     Iron -     Ferritin  DOE (dyspnea on exertion)  Abnormal thyroid function test -     TSH -     T4, free  Tobacco abuse

## 2016-04-16 ENCOUNTER — Telehealth: Payer: Self-pay | Admitting: Medical

## 2016-04-16 ENCOUNTER — Other Ambulatory Visit: Payer: Self-pay | Admitting: *Deleted

## 2016-04-16 ENCOUNTER — Other Ambulatory Visit: Payer: Self-pay | Admitting: Medical

## 2016-04-16 VITALS — BP 105/70 | Ht 69.0 in | Wt 239.0 lb

## 2016-04-16 DIAGNOSIS — R0602 Shortness of breath: Secondary | ICD-10-CM

## 2016-04-16 DIAGNOSIS — E1169 Type 2 diabetes mellitus with other specified complication: Secondary | ICD-10-CM

## 2016-04-16 DIAGNOSIS — Z794 Long term (current) use of insulin: Principal | ICD-10-CM

## 2016-04-16 LAB — VITAMIN D 25 HYDROXY (VIT D DEFICIENCY, FRACTURES): VIT D 25 HYDROXY: 26 ng/mL — AB (ref 30–100)

## 2016-04-16 LAB — HEMOGLOBIN A1C
HEMOGLOBIN A1C: 6.8 % — AB (ref <5.7)
Mean Plasma Glucose: 148 mg/dL

## 2016-04-16 MED ORDER — FUROSEMIDE 20 MG PO TABS: 20.0000 mg | ORAL_TABLET | Freq: Every day | ORAL | 3 refills | Status: DC

## 2016-04-16 MED ORDER — ASPIRIN 81 MG PO TBEC: 81.0000 mg | DELAYED_RELEASE_TABLET | Freq: Every day | ORAL | 3 refills | Status: DC

## 2016-04-16 MED ORDER — FERROUS GLUCONATE 324 (38 FE) MG PO TABS
324.0000 mg | ORAL_TABLET | Freq: Every day | ORAL | 1 refills | Status: DC
Start: 2016-04-16 — End: 2017-02-12

## 2016-04-16 MED ORDER — ATORVASTATIN CALCIUM 40 MG PO TABS: 40.0000 mg | ORAL_TABLET | Freq: Every day | ORAL | 3 refills | Status: DC

## 2016-04-16 MED ORDER — INSULIN GLARGINE 100 UNIT/ML SOLOSTAR PEN: 10.0000 [IU] | PEN_INJECTOR | Freq: Every day | SUBCUTANEOUS | 5 refills | Status: DC

## 2016-04-16 MED ORDER — BUDESONIDE-FORMOTEROL FUMARATE 160-4.5 MCG/ACT IN AERO: 2.0000 | INHALATION_SPRAY | Freq: Two times a day (BID) | RESPIRATORY_TRACT | 11 refills | Status: DC

## 2016-04-16 MED ORDER — METOPROLOL TARTRATE 50 MG PO TABS: 50.0000 mg | ORAL_TABLET | Freq: Two times a day (BID) | ORAL | 1 refills | Status: DC

## 2016-04-16 MED ORDER — METFORMIN HCL 1000 MG PO TABS: 1000.0000 mg | ORAL_TABLET | Freq: Two times a day (BID) | ORAL | 3 refills | Status: DC

## 2016-04-16 MED ORDER — CLOPIDOGREL BISULFATE 75 MG PO TABS: 75.0000 mg | ORAL_TABLET | Freq: Every day | ORAL | 3 refills | Status: DC

## 2016-04-16 MED ORDER — ALBUTEROL SULFATE HFA 108 (90 BASE) MCG/ACT IN AERS: 2.0000 | INHALATION_SPRAY | Freq: Four times a day (QID) | RESPIRATORY_TRACT | 0 refills | Status: DC | PRN

## 2016-04-16 MED FILL — FUROSEMIDE 20 MG TABLET: 20 | 90 days supply | Qty: 90 | Fill #0

## 2016-04-16 MED FILL — VENTOLIN HFA 90 MCG INHALER: 108 (90 BAS | 25 days supply | Qty: 18 | Fill #0

## 2016-04-16 MED FILL — SYMBICORT 160-4.5 MCG INH: 160-4.5 | 30 days supply | Qty: 10 | Fill #0

## 2016-04-16 MED FILL — CLOPIDOGREL 75 MG TABLET: 75 | 90 days supply | Qty: 90 | Fill #0

## 2016-04-16 MED FILL — metFORMIN HCL 1000 MG TABS: 1000 | 90 days supply | Qty: 180 | Fill #0

## 2016-04-16 MED FILL — LANTUS SOLOSTAR 100 UNITS/M: 100 | 90 days supply | Qty: 9 | Fill #0

## 2016-04-17 ENCOUNTER — Encounter: Payer: Self-pay | Admitting: Medical

## 2016-04-17 ENCOUNTER — Other Ambulatory Visit: Payer: Self-pay | Admitting: Medical

## 2016-04-17 MED ORDER — VITAMIN D (ERGOCALCIFEROL) 1.25 MG (50000 UNIT) PO CAPS: 50000.0000 [IU] | ORAL_CAPSULE | ORAL | 3 refills | Status: DC

## 2016-04-17 NOTE — Telephone Encounter (Signed)
error 

## 2016-04-18 ENCOUNTER — Encounter: Payer: Self-pay | Admitting: *Deleted

## 2016-04-18 NOTE — Patient Outreach (Signed)
Jennifer Cervantes) Care Management  04/16/16  Jennifer Cervantes 1980-11-13 [id number]  Jennifer Cervantes is an 36 y.o. female who presents to the [place] [facility] office with her Vassie Moment for routine Link To Wellness follow up for self management assistance with Type II DM, HTN and hyperlipidemia, obesity and CAD.    Subjective:   Jennifer Cervantes says she will return to work on 3/1 following hospitalization from 2/6-2/8 for chest pain and rapid heart rate. She says she is wearing a Holter monitor for one month and has not experienced any rapid or irregular heart issues since discharge from the hospital.   She continues to experience shortness of breath and says she will have pulmonary function tests done in the near future. She says she stopped smoking upon discharge form the hospital with the help of nicotine patches.  She reports she checks her blood sugar 3 times daily, before each meal and the variance has been 60-110. She stated the correct treatment for hypoglycemia.  She attributes her improved blood sugar control to taking her medications consistently and consistent following a low CHO meal plan.  She says she saw her primary care provider yesterday and had labs drawn but does not have the results yet.  She says she has an appointment to onboard to the [employer].   Objective:   Jennifer Cervantes is wearing a Holter monitor.   Review of Systems  Constitutional: Negative.     Physical Exam  Constitutional: She is oriented to person, place, and time. She appears well-developed and well-nourished.  Cardiovascular: Normal rate and regular rhythm.   Neurological: She is alert and oriented to person, place, and time.  Skin: Skin is warm and dry.  Psychiatric: She has a normal mood and affect. Her behavior is normal. Judgment and thought content normal.   Filed Weights   04/16/16 0945  Weight: 239 lb (108.4 kg)   Vitals:   04/16/16 0945  BP: 105/70    Encounter Medications:   Outpatient Encounter Prescriptions as of 04/16/2016  Medication Sig Note  . acetaminophen (TYLENOL) 500 MG tablet Take 500 mg by mouth every 6 (six) hours as needed for headache (pain).   [name] albuterol (PROVENTIL HFA;VENTOLIN HFA) 108 (90 Base) MCG/ACT inhaler Inhale 2 puffs into the lungs every 6 (six) hours as needed for wheezing or shortness of breath.   [name] aspirin 81 MG EC tablet Take 1 tablet (81 mg total) by mouth daily.   [name] atorvastatin (LIPITOR) 40 MG tablet Take 1 tablet (40 mg total) by mouth daily at 6 PM.   . budesonide-formoterol (SYMBICORT) 160-4.5 MCG/ACT inhaler Inhale 2 puffs into the lungs 2 (two) times daily.   . Cholecalciferol (VITAMIN D3) 50000 units CAPS Take 1 capsule by mouth once a week. (Patient taking differently: Take 1 capsule by mouth every Friday. )   . clopidogrel (PLAVIX) 75 MG tablet Take 1 tablet (75 mg total) by mouth daily.   . ferrous gluconate (FERGON) 324 MG tablet Take 1 tablet (324 mg total) by mouth daily with breakfast.   . furosemide (LASIX) 20 MG tablet Take 1 tablet (20 mg total) by mouth daily.   . Insulin Glargine (LANTUS SOLOSTAR) 100 UNIT/ML Solostar Pen Inject 10 Units into the skin daily at 10 pm.   . Insulin Pen Needle (PEN NEEDLES) 32G X 4 MM MISC 1 each by Does not apply route daily. Use with Lantus pen   . lisinopril (PRINIVIL,ZESTRIL) 2.5  MG tablet Take 1 tablet (2.5 mg total) by mouth daily.   . metFORMIN (GLUCOPHAGE) 1000 MG tablet Take 1 tablet (1,000 mg total) by mouth 2 (two) times daily with a meal.   . metoprolol (LOPRESSOR) 50 MG tablet Take 1 tablet (50 mg total) by mouth 2 (two) times daily.   . nicotine (NICODERM CQ - DOSED IN MG/24 HOURS) 21 mg/24hr patch Place 1 patch (21 mg total) onto the skin daily.   . nitroGLYCERIN (NITROSTAT) 0.4 MG SL tablet Place 1 tablet (0.4 mg total) under the tongue every 5 (five) minutes as needed for chest pain.   . pantoprazole (PROTONIX) 40  MG tablet Take 1 tablet (40 mg total) by mouth daily.   . Vitamin D, Ergocalciferol, (DRISDOL) 50000 units CAPS capsule Take 1 capsule (50,000 Units total) by mouth every 7 (seven) days.   . [DISCONTINUED] albuterol (PROVENTIL HFA;VENTOLIN HFA) 108 (90 Base) MCG/ACT inhaler Inhale 2 puffs into the lungs every 6 (six) hours as needed for wheezing or shortness of breath. 10/08/2015: 4 times daily  . [DISCONTINUED] aspirin 81 MG EC tablet Take 1 tablet (81 mg total) by mouth daily.   . [DISCONTINUED] atorvastatin (LIPITOR) 40 MG tablet Take 1 tablet (40 mg total) by mouth daily at 6 PM.   . [DISCONTINUED] budesonide-formoterol (SYMBICORT) 160-4.5 MCG/ACT inhaler Inhale 2 puffs into the lungs 2 (two) times daily.   . [DISCONTINUED] clopidogrel (PLAVIX) 75 MG tablet Take 1 tablet (75 mg total) by mouth daily.   . [DISCONTINUED] ferrous gluconate (FERGON) 324 MG tablet Take 1 tablet (324 mg total) by mouth daily with breakfast.   . [DISCONTINUED] furosemide (LASIX) 20 MG tablet Take 1 tablet (20 mg total) by mouth daily.   . [DISCONTINUED] Insulin Glargine (LANTUS SOLOSTAR) 100 UNIT/ML Solostar Pen Inject 10 Units into the skin daily at 10 pm. 10/08/2015: At 6 pm  . [DISCONTINUED] metFORMIN (GLUCOPHAGE) 1000 MG tablet Take 1 tablet (1,000 mg total) by mouth 2 (two) times daily with a meal.   . [DISCONTINUED] metoprolol tartrate (LOPRESSOR) 50 MG tablet Take 1 tablet (50 mg total) by mouth 2 (two) times daily.    No facility-administered encounter medications on file as of 04/16/2016.     Functional Status:   In your present state of health, do you have any difficulty performing the following activities: 04/02/2016 04/02/2016  Hearing? - N  Vision? - N  Difficulty concentrating or making decisions? - N  Walking or climbing stairs? - N  Dressing or bathing? - N  Doing errands, shopping? N -  Preparing Food and eating ? - -  Using the Toilet? - -  In the past six months, have you accidently leaked urine? -  -  Do you have problems with loss of bowel control? - -  Managing your Medications? - -  Managing your Finances? - -  Housekeeping or managing your Housekeeping? - -  Some recent data might be hidden    Fall/Depression Screening:    PHQ 2/9 Scores 04/15/2016 10/15/2015 10/08/2015 07/25/2015  PHQ - 2 Score 0 1 3 5   PHQ- 9 Score - 6 8 15     Assessment:   [employer] employee and Link To Wellness member with Type II diabetes, HTN, Hyperlipidemia, obesity and new diagnosis of lung disease, asthma vs COPD.  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, Type II DM with most recent Hgb A1C= 6.8% on 04/15/16, HTN with BP in good control at present, hyperlipidemia with low but improved HDL = 43 per lipid panel on 01/24/17, tobacco abuse with diagnosis of COPD vs asthma, stopped smoking on 04/01/16  Role Documenting the Problem One  Care Management Coordinator  Care Plan for Problem One  Active  [facility] Long Term Goal (31-90 days) Patient will demonstrate ongoing improved glycemic control as evidenced by CBGs meeting target >80% of the time without increased incidents of hypoglycemia and mPteeting Hgb A1C target at next assessment, ongoing good control of HTN as evidenced by BP readings < 130/<80, improved lipid panel at next assessment, patient will verbalize understanding of COPD action plan and not require visit to ER or inpatient admittance,  she will remain successful with smoking cessation, ongoing weight loss or no weight gain at next assessment    [facility] Long Term Goal Start Date  04/16/16  Interventions for Problem One Long Term Goal Reviewed patient's medications and assessed medication adherence, discussed patient's reported CBG readings, reviewed target ranges for pre-meal and post-meal, again discussed referral to RD for nutritional counseling and she agreed so referral made, ensured she has  an onboarding appointment with [facility] tomorrow at 2:30 pm, advised Jennifer Cervantes that ongoing DM assistance will be provided via [employer].       RNCM to fax today's office visit note to [facility].    Barrington Ellison RN,CCM,CDE [place] Management Coordinator Link To Wellness Office Phone [phone] Office Fax [phone]

## 2016-04-28 ENCOUNTER — Telehealth: Payer: Self-pay | Admitting: Physician Assistant

## 2016-04-28 NOTE — Telephone Encounter (Signed)
Dr. Tamala Julian filling out paperwork for [employer].  Needed to know what kind of work pt does.  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  Advised I will get information to Dr. Tamala Julian once he returns to office on Wednesday.  Pt appreciative for call.

## 2016-04-28 NOTE — Telephone Encounter (Signed)
New message   Pt is calling stating she is returning call to [place].

## 2016-04-29 MED FILL — LISINOPRIL 2.5 MG TABLET: 2.5 | 90 days supply | Qty: 90 | Fill #1

## 2016-04-29 MED FILL — VIT D3-50 50,000 UNITS CAPS: 1.25 MG | 84 days supply | Qty: 12 | Fill #0

## 2016-04-29 MED FILL — PANTOPRAZOLE SOD DR 40 MG T: 40 | 90 days supply | Qty: 90 | Fill #1

## 2016-05-05 ENCOUNTER — Ambulatory Visit: Payer: Self-pay | Admitting: *Deleted

## 2016-05-06 ENCOUNTER — Telehealth: Payer: Self-pay | Admitting: Medical

## 2016-05-06 NOTE — Telephone Encounter (Signed)
Pt called and stated she wants to go ahead and get that referral to GI. Please set up appt and inform pt at [phone].

## 2016-05-07 ENCOUNTER — Encounter: Payer: Self-pay | Admitting: Gastroenterology

## 2016-05-07 ENCOUNTER — Other Ambulatory Visit: Payer: Self-pay

## 2016-05-07 DIAGNOSIS — R12 Heartburn: Secondary | ICD-10-CM

## 2016-05-07 NOTE — Telephone Encounter (Signed)
Sent referral to [facility] 

## 2016-05-07 NOTE — Telephone Encounter (Signed)
She is having burning in her throat ,heartburn . She is taking omeprazole  And it not helping.

## 2016-05-07 NOTE — Telephone Encounter (Signed)
Please clarify?  I didn't put specific concerns about GI issue last visit notes which was a physical.  I did make mention of cardiology recheck though.    What was the symptoms or concern regarding GI?

## 2016-05-07 NOTE — Telephone Encounter (Signed)
Ok, refer to GI

## 2016-05-09 MED FILL — METOPROLOL TARTRATE 50 MG T: 50 | 30 days supply | Qty: 60 | Fill #1

## 2016-05-09 MED FILL — ATORVASTATIN 40 MG TABLET: 40 | 30 days supply | Qty: 30 | Fill #1

## 2016-05-20 ENCOUNTER — Encounter: Payer: 59 | Attending: Medical | Admitting: Nutrition

## 2016-05-20 VITALS — Ht 69.0 in | Wt 247.0 lb

## 2016-05-20 DIAGNOSIS — E1165 Type 2 diabetes mellitus with hyperglycemia: Secondary | ICD-10-CM

## 2016-05-20 DIAGNOSIS — Z713 Dietary counseling and surveillance: Secondary | ICD-10-CM | POA: Insufficient documentation

## 2016-05-20 DIAGNOSIS — E118 Type 2 diabetes mellitus with unspecified complications: Secondary | ICD-10-CM

## 2016-05-20 DIAGNOSIS — Z794 Long term (current) use of insulin: Secondary | ICD-10-CM

## 2016-05-20 DIAGNOSIS — IMO0002 Reserved for concepts with insufficient information to code with codable children: Secondary | ICD-10-CM

## 2016-05-20 NOTE — Progress Notes (Signed)
Diabetes Self-Management Education  Visit Type: First/Initial  Appt. Start Time: 1530 Appt. End Time: 3220  05/21/2016  Jennifer Cervantes, identified by name and date of birth, is a 36 y.o. female with a diagnosis of Diabetes: Type 2. Here with her mom. Had a MI April 2017. Lost 30 lbs in the last 6 months. Isn't following any particular diet. Eats what she wants. Rides exercise bike 30 minutes a day; just started recently. Didn't bring meter. Says her BS are avg 114- 170's sometimes.   ASSESSMENT  Height 5\' 9"  (1.753 m), weight 247 lb (112 kg). Body mass index is 36.48 kg/m.  Lab Results  Component Value Date   HGBA1C 6.8 (H) 04/15/2016   CMP Latest Ref Rng & Units 04/10/2016 04/03/2016 04/02/2016  Glucose 65 - 99 mg/dL 186(H) 148(H) -  BUN 6 - 20 mg/dL 8 7 -  Creatinine 0.57 - 1.00 mg/dL 0.88 0.77 0.78  Sodium 134 - 144 mmol/L 137 137 -  Potassium 3.5 - 5.2 mmol/L 4.1 3.7 -  Chloride 96 - 106 mmol/L 95(L) 107 -  CO2 18 - 29 mmol/L 23 21(L) -  Calcium 8.7 - 10.2 mg/dL 9.9 8.8(L) -  Total Protein 6.1 - 8.1 g/dL - - -  Total Bilirubin 0.2 - 1.2 mg/dL - - -  Alkaline Phos 33 - 115 U/L - - -  AST 10 - 30 U/L - - -  ALT 6 - 29 U/L - - -         Diabetes Self-Management Education - 05/20/16 1502      Visit Information   Visit Type First/Initial     Initial Visit   Diabetes Type Type 2   Are you currently following a meal plan? No   Are you taking your medications as prescribed? Yes   Date Diagnosed 2011     Health Coping   How would you rate your overall health? Poor     Psychosocial Assessment   Patient Belief/Attitude about Diabetes Afraid   Self-care barriers None   Other persons present Patient;Parent   Patient Concerns Nutrition/Meal planning;Medication;Monitoring;Healthy Lifestyle;Problem Solving;Weight Control;Glycemic Control   Special Needs None   Preferred Learning Style Auditory   Learning Readiness Ready   How often do you need to have someone help  you when you read instructions, pamphlets, or other written materials from your doctor or pharmacy? 1 - Never   What is the last grade level you completed in school? 12     Pre-Education Assessment   Patient understands the diabetes disease and treatment process. Needs Instruction   Patient understands incorporating nutritional management into lifestyle. Needs Instruction   Patient undertands incorporating physical activity into lifestyle. Needs Instruction   Patient understands using medications safely. Needs Instruction   Patient understands monitoring blood glucose, interpreting and using results Needs Instruction   Patient understands prevention, detection, and treatment of acute complications. Needs Instruction   Patient understands prevention, detection, and treatment of chronic complications. Needs Instruction   Patient understands how to develop strategies to address psychosocial issues. Needs Instruction   Patient understands how to develop strategies to promote health/change behavior. Needs Instruction     Complications   Last HgB A1C per patient/outside source 6.8 %   How often do you check your blood sugar? 1-2 times/day   Fasting Blood glucose range (mg/dL) 130-179   Postprandial Blood glucose range (mg/dL) 180-200   Number of hypoglycemic episodes per month 0   Number of hyperglycemic episodes per week  3   Can you tell when your blood sugar is high? Yes   What do you do if your blood sugar is high? nothing   Have you had a dilated eye exam in the past 12 months? No   Have you had a dental exam in the past 12 months? Yes   Are you checking your feet? Yes   How many days per week are you checking your feet? 7     Dietary Intake   Breakfast  Poptart or waffles or grits or eggs and bacon and toast or cereal, soda,    Snack (morning) candy   Lunch  6 in sub-ham , rootbeer diet,    Snack (afternoon) nothing   Dinner Grilled cheese sandwich 1 on white bread and  soda    Snack  (evening) candy bar   Beverage(s) soda, flavored water     Exercise   Exercise Type Light (walking / raking leaves)   How many days per week to you exercise? 7   How many minutes per day do you exercise? 30   Total minutes per week of exercise 210     Patient Education   Disease state  Definition of diabetes, type 1 and 2, and the diagnosis of diabetes;Factors that contribute to the development of diabetes   Nutrition management  Role of diet in the treatment of diabetes and the relationship between the three main macronutrients and blood glucose level;Food label reading, portion sizes and measuring food.;Carbohydrate counting;Reviewed blood glucose goals for pre and post meals and how to evaluate the patients' food intake on their blood glucose level.;Meal timing in regards to the patients' current diabetes medication.;Meal options for control of blood glucose level and chronic complications.   Physical activity and exercise  Role of exercise on diabetes management, blood pressure control and cardiac health.;Identified with patient nutritional and/or medication changes necessary with exercise.   Medications Reviewed patients medication for diabetes, action, purpose, timing of dose and side effects.;Reviewed medication adjustment guidelines for hyperglycemia and sick days.   Monitoring Taught/evaluated SMBG meter.;Taught/discussed recording of test results and interpretation of SMBG.;Identified appropriate SMBG and/or A1C goals.   Chronic complications Relationship between chronic complications and blood glucose control;Assessed and discussed foot care and prevention of foot problems;Lipid levels, blood glucose control and heart disease;Identified and discussed with patient  current chronic complications;Retinopathy and reason for yearly dilated eye exams;Nephropathy, what it is, prevention of, the use of ACE, ARB's and early detection of through urine microalbumia.;Dental care;Reviewed with patient  heart disease, higher risk of, and prevention   Psychosocial adjustment Worked with patient to identify barriers to care and solutions;Identified and addressed patients feelings and concerns about diabetes   Personal strategies to promote health Lifestyle issues that need to be addressed for better diabetes care;Review risk of smoking and offered smoking cessation     Individualized Goals (developed by patient)   Nutrition Follow meal plan discussed;General guidelines for healthy choices and portions discussed;Adjust meds/carbs with exercise as discussed   Physical Activity Exercise 3-5 times per week;30 minutes per day   Medications take my medication as prescribed   Monitoring  test my blood glucose as discussed;test blood glucose pre and post meals as discussed   Reducing Risk examine blood glucose patterns;get labs drawn;do foot checks daily;treat hypoglycemia with 15 grams of carbs if blood glucose less than 70mg /dL;increase portions of nuts and seeds;increase portions of olive oil in diet     Post-Education Assessment   Patient understands the diabetes  disease and treatment process. Needs Review   Patient understands incorporating nutritional management into lifestyle. Needs Review   Patient undertands incorporating physical activity into lifestyle. Needs Review   Patient understands using medications safely. Needs Review   Patient understands monitoring blood glucose, interpreting and using results Needs Review     Outcomes   Expected Outcomes Demonstrated interest in learning. Expect positive outcomes   Future DMSE 4-6 wks   Program Status Completed      Individualized Plan for Diabetes Self-Management Training:   Learning Objective:  Patient will have a greater understanding of diabetes self-management. Patient education plan is to attend individual and/or group sessions per assessed needs and concerns.   Plan:   Patient Instructions  Goals 1. Follow My Palte 2. Cut out  candy or candy 3. Increase fresh fruits and veggies 4. Don't skip meals 5. Keep riding bike 30 minutes a day 6. Cut out processed meats Lose 1-2 lbs per week Aim for FBS 80-130 and Bedtime 100-150 mg/dl    Expected Outcomes:  Demonstrated interest in learning. Expect positive outcomes  Education material provided: Living Well with Diabetes, Food label handouts, A1C conversion sheet, Meal plan card, My Plate and Carbohydrate counting sheet  If problems or questions, patient to contact team via:  Phone and Email  Future DSME appointment: 4-6 wks

## 2016-05-20 NOTE — Patient Instructions (Signed)
Goals 1. Follow My Palte 2. Cut out candy or candy 3. Increase fresh fruits and veggies 4. Don't skip meals 5. Keep riding bike 30 minutes a day 6. Cut out processed meats Lose 1-2 lbs per week Aim for FBS 80-130 and Bedtime 100-150 mg/dl

## 2016-05-22 ENCOUNTER — Encounter: Payer: Self-pay | Admitting: Interventional Cardiology

## 2016-06-04 ENCOUNTER — Encounter: Payer: Self-pay | Admitting: Interventional Cardiology

## 2016-06-04 ENCOUNTER — Ambulatory Visit (INDEPENDENT_AMBULATORY_CARE_PROVIDER_SITE_OTHER): Payer: 59 | Admitting: Interventional Cardiology

## 2016-06-04 VITALS — BP 126/90 | HR 86 | Ht 69.0 in | Wt 246.4 lb

## 2016-06-04 DIAGNOSIS — S5411XD Injury of median nerve at forearm level, right arm, subsequent encounter: Secondary | ICD-10-CM

## 2016-06-04 DIAGNOSIS — I5032 Chronic diastolic (congestive) heart failure: Secondary | ICD-10-CM | POA: Diagnosis not present

## 2016-06-04 DIAGNOSIS — IMO0001 Reserved for inherently not codable concepts without codable children: Secondary | ICD-10-CM

## 2016-06-04 DIAGNOSIS — Z720 Tobacco use: Secondary | ICD-10-CM

## 2016-06-04 DIAGNOSIS — Z6841 Body Mass Index (BMI) 40.0 and over, adult: Secondary | ICD-10-CM

## 2016-06-04 DIAGNOSIS — I25111 Atherosclerotic heart disease of native coronary artery with angina pectoris with documented spasm: Secondary | ICD-10-CM

## 2016-06-04 DIAGNOSIS — E6609 Other obesity due to excess calories: Secondary | ICD-10-CM

## 2016-06-04 DIAGNOSIS — E7800 Pure hypercholesterolemia, unspecified: Secondary | ICD-10-CM | POA: Diagnosis not present

## 2016-06-04 MED ORDER — NITROGLYCERIN 0.4 MG SL SUBL: 0.4000 mg | SUBLINGUAL_TABLET | SUBLINGUAL | 3 refills | Status: DC | PRN

## 2016-06-04 MED ORDER — LISINOPRIL-HYDROCHLOROTHIAZIDE 10-12.5 MG PO TABS
1.0000 | ORAL_TABLET | Freq: Every day | ORAL | 3 refills | Status: DC
Start: 2016-06-04 — End: 2016-09-02

## 2016-06-04 MED FILL — LISINOPRIL-HCTZ 10-12.5 MG: 10-12.5 | 90 days supply | Qty: 90 | Fill #0

## 2016-06-04 MED FILL — NITROGLYCERIN 0.4 MG TAB SL: 0.4 | 13 days supply | Qty: 25 | Fill #0

## 2016-06-04 NOTE — Patient Instructions (Signed)
Medication Instructions:  1) DISCONTINUE Furosemide 2) DISCONTINUE plain Lisinopril 3) START Lisinopril/Hydrochlorothiazide 10/12.5mg  once daily.  Labwork: BMET in 7-10 days  Testing/Procedures: None  Follow-Up: Your physician recommends that you schedule a follow-up appointment in: 4-6 weeks with Dr. Tamala Julian.   Any Other Special Instructions Will Be Listed Below (If Applicable).     If you need a refill on your cardiac medications before your next appointment, please call your pharmacy.

## 2016-06-04 NOTE — Progress Notes (Signed)
Cardiology Office Note    Date:  06/04/2016   ID:  CARRIEANNE KLEEN, DOB May 03, 1980, MRN [id number]  PCP:  Crisoforo Oxford, PA-C  Cardiologist: Sinclair Grooms, MD   Chief Complaint  Patient presents with  . Coronary Artery Disease  . Congestive Heart Failure  . Follow-up    Diabetes mellitus    History of Present Illness:  Jennifer Cervantes is a 36 y.o. female with a history ofuncontrolled DM, HTN, morbid obesity, tobacco abuse, MR/TR and CAD s/p DES x2 to mLAD (05/2015) and DES to dLAD (03/2016) who presents to clinic for post hospital follow up.   Earnestine continues to have difficulty with her left arm which was diagnosed as having neuropraxia of the median nerve. She has multiple questions and she is concerned about her cardiac condition and feels that explanations given so far have been confusing are not file run not for her to understand.  First of all she is concerned that when called about the recent echo she was told there was diastolic heart failure. She has been concerned about this.  She is concerned that her blood pressures running too high.  She is having dyspnea on exertion and some orthopnea. She sleeps on 2 pillows. This is new since her initial myocardial infarction. She denies any recurrence of chest pain.   Past Medical History:  Diagnosis Date  . CAD (coronary artery disease)    a. LHC on 06/21/15 which showed 75% occl mid-dist LCx, 40% occl mRCA, 99% mid-dist LAD s/p DES and 50% occl mLAD s/p DES (overlapping stents) and significant LV dysfunction with anteroapical HK; b. 06/2015 Myoview: EF 55-65%, no ischemia/infarct.  . Diabetes mellitus without complication ([place])   . Hyperlipidemia   . Hypertension   . Myocardial stunning (HCC)    a. EF 45% on 2D ECHO on 06/21/15 and severe LV dysfunction on LV gram by cath. Repeat 2D ECHO with EF 55-60% and mild HK of the apicoseptal myocardium.  [name] Neuropraxia of right median nerve    a. suspected after right  radial cath. will follow with neuro outpatient if does not resolve.   . NSTEMI (non-ST elevated myocardial infarction) [facility]) 05/2015   [facility]  . Obesity   . 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 05/2015 Following PCI (radial access)-->conservatively managed.  . Tobacco abuse   . Tricuspid regurgitation   . Type II diabetes mellitus ([place])    a. Type II    Past Surgical History:  Procedure Laterality Date  . CARDIAC CATHETERIZATION N/A 06/21/2015   Procedure: Left Heart Cath and Coronary Angiography;  Surgeon: Belva Crome, MD; pLAD 99%, CFX 75%, RCA 40%, EF 35%   . CARDIAC CATHETERIZATION N/A 06/21/2015   Procedure: Coronary Stent Intervention;  Surgeon: Belva Crome, MD;  Promus Premier DES, 3.0 x 20 and 3.5 x 32 mm stents to the LAD  . CARDIAC CATHETERIZATION N/A 06/21/2015   Procedure: Intravascular Ultrasound/IVUS;  Surgeon: Belva Crome, MD;  Location: [place] [facility];  Service: Cardiovascular;  Laterality: N/A;  LAD  . CARDIAC CATHETERIZATION  06/22/2015   Procedure: Coronary/Graft Angiography;  Surgeon: Peter M Martinique, MD;  Location: [place] [facility];  Service: Cardiovascular;;  . CORONARY STENT INTERVENTION N/A 04/02/2016   Procedure: Coronary Stent Intervention;  Surgeon: Jettie Booze, MD;  Location: [place] [facility];  Service: Cardiovascular;  Laterality: N/A;  . LEFT HEART CATH AND CORONARY ANGIOGRAPHY N/A 04/02/2016  Procedure: Left Heart Cath and Coronary Angiography;  Surgeon: Jettie Booze, MD;  Location: [place] [facility];  Service: Cardiovascular;  Laterality: N/A;    Current Medications: Outpatient Medications Prior to Visit  Medication Sig Dispense Refill  . acetaminophen (TYLENOL) 500 MG tablet Take 500 mg by mouth every 6 (six) hours as needed for headache (pain).    [name] albuterol (PROVENTIL HFA;VENTOLIN HFA) 108 (90 Base) MCG/ACT inhaler Inhale 2 puffs into the lungs every 6 (six) hours as needed for wheezing or shortness of breath. 1  Inhaler 0  . aspirin 81 MG EC tablet Take 1 tablet (81 mg total) by mouth daily. 90 tablet 3  . atorvastatin (LIPITOR) 40 MG tablet Take 1 tablet (40 mg total) by mouth daily at 6 PM. 90 tablet 3  . budesonide-formoterol (SYMBICORT) 160-4.5 MCG/ACT inhaler Inhale 2 puffs into the lungs 2 (two) times daily. 1 Inhaler 11  . clopidogrel (PLAVIX) 75 MG tablet Take 1 tablet (75 mg total) by mouth daily. 90 tablet 3  . ferrous gluconate (FERGON) 324 MG tablet Take 1 tablet (324 mg total) by mouth daily with breakfast. 90 tablet 1  . Insulin Glargine (LANTUS SOLOSTAR) 100 UNIT/ML Solostar Pen Inject 10 Units into the skin daily at 10 pm. 5 pen 5  . Insulin Pen Needle (PEN NEEDLES) 32G X 4 MM MISC 1 each by Does not apply route daily. Use with Lantus pen 100 each 5  . metFORMIN (GLUCOPHAGE) 1000 MG tablet Take 1 tablet (1,000 mg total) by mouth 2 (two) times daily with a meal. 180 tablet 3  . metoprolol (LOPRESSOR) 50 MG tablet Take 1 tablet (50 mg total) by mouth 2 (two) times daily. 180 tablet 1  . nicotine (NICODERM CQ - DOSED IN MG/24 HOURS) 21 mg/24hr patch Place 1 patch (21 mg total) onto the skin daily. 28 patch 0  . pantoprazole (PROTONIX) 40 MG tablet Take 1 tablet (40 mg total) by mouth daily. 30 tablet 6  . Vitamin D, Ergocalciferol, (DRISDOL) 50000 units CAPS capsule Take 1 capsule (50,000 Units total) by mouth every 7 (seven) days. 12 capsule 3  . furosemide (LASIX) 20 MG tablet Take 1 tablet (20 mg total) by mouth daily. 90 tablet 3  . lisinopril (PRINIVIL,ZESTRIL) 2.5 MG tablet Take 1 tablet (2.5 mg total) by mouth daily. 30 tablet 6  . nitroGLYCERIN (NITROSTAT) 0.4 MG SL tablet Place 1 tablet (0.4 mg total) under the tongue every 5 (five) minutes as needed for chest pain. 25 tablet 3  . Cholecalciferol (VITAMIN D3) 50000 units CAPS Take 1 capsule by mouth once a week. (Patient taking differently: Take 1 capsule by mouth every Friday. ) 12 capsule 1   No facility-administered medications  prior to visit.      Allergies:   Patient has no known allergies.   Social History   Social History  . Marital status: Single    Spouse name: N/A  . Number of children: 4  . Years of education: 12   Occupational History  . Nurse [employer]   Social History Main Topics  . Smoking status: Former Smoker    Packs/day: 0.50    Years: 23.00    Types: Cigarettes    Quit date: 04/01/2016  . Smokeless tobacco: Never Used  . Alcohol use No  . Drug use: No  . Sexual activity: Not Asked   Other Topics Concern  . None   Social History Narrative   Lives in [place], works nights at  [employer], C[employer]   Right-handed   Drinks 1 soda a day   Exercises some with walking   Lives with her 23 yo son   Likes her space, doesn't want a significant other   03/2016     Family History:  The patient's family history includes Healthy in her brother, father, mother, and sister.   ROS:    Please see the history of present illness.    Dyspnea on exertion. Stiffness and decreased takes 30 in the right hand. Numbness in the right hand. Median nerve injury secondary to catheterization  All other systems reviewed and are negative.   PHYSICAL EXAM:   VS:  BP 126/90 (BP Location: Left Arm, Patient Position: Sitting, Cuff Size: Large)   Pulse 86   Ht 5\' 9"  (1.753 m)   Wt 246 lb 6.4 oz (111.8 kg)   SpO2 99%   BMI 36.39 kg/m    GEN: Well nourished, well developed, in no acute distress  HEENT: normal  Neck: no JVD, carotid bruits, or masses Cardiac: RRR; no murmurs, rubs, or gallops,no edema  Respiratory:  clear to auscultation bilaterally, normal work of breathing GI: soft, nontender, nondistended, + BS MS: no deformity or atrophy  Skin: warm and dry, no rash Neuro:  Alert and Oriented x 3, Strength and sensation are intact Psych: euthymic mood, full affect  Wt Readings from Last 3 Encounters:  06/04/16 246 lb 6.4 oz (111.8 kg)  05/20/16 247 lb (112 kg)  04/16/16 239 lb (108.4 kg)       Studies/Labs Reviewed:   EKG:  EKG  Not repeated  Recent Labs: 01/25/2016: ALT 10 04/03/2016: Hemoglobin 11.2 04/10/2016: BUN 8; Creatinine, Ser 0.88; NT-Pro BNP 21; Platelets 263; Potassium 4.1; Sodium 137 04/15/2016: TSH 0.56   Lipid Panel    Component Value Date/Time   CHOL 140 01/25/2016 1015   TRIG 63 01/25/2016 1015   HDL 43 (L) 01/25/2016 1015   CHOLHDL 3.3 01/25/2016 1015   VLDL 13 01/25/2016 1015   LDLCALC 84 01/25/2016 1015    Additional studies/ records that were reviewed today include:  Echocardiogram 04/10/16: ------------------------------------------------------------------- Study Conclusions  - Left ventricle: The cavity size was normal. Wall thickness was   increased in a pattern of mild LVH. Systolic function was normal.   The estimated ejection fraction was in the range of 55% to 60%.   Wall motion was normal; there were no regional wall motion   abnormalities. Features are consistent with a pseudonormal left   ventricular filling pattern, with concomitant abnormal relaxation   and increased filling pressure (grade 2 diastolic dysfunction). - Aortic valve: There was trivial regurgitation.   ASSESSMENT:    1. Coronary artery disease involving native coronary artery of native heart with angina pectoris with documented spasm ([place])   2. Chronic diastolic heart failure ([place])   3. Neurapraxia of right median nerve, subsequent encounter   4. Pure hypercholesterolemia   5. Tobacco abuse   6. Class 3 obesity due to excess calories with serious comorbidity and body mass index (BMI) of 40.0 to 44.9 in adult [facility])      PLAN:  In order of problems listed above:  1. Stable without angina. Overlapping Proximal to distal LAD stents placed in 2017 and most recently February 2018. No recurrent angina. No nitroglycerin use. We discussed her digital images and reviewed post PCI diagram. 2. We will change her current regimen from lisinopril 2.5 mg per day and  furosemide 20 mg daily to lisinopril  HCT 10/12.5 mg. May ultimately need significant up titration of both components. 3. Still complains of decreased function in the right hand although she does feel it is better now than 6 months ago. 4. Hyperlipidemia on atorvastatin. Last lipid panel December 2017 when LDL was 84. 5. No longer smokes cigarettes. 6. We discussed her weight. She seems slimmer but her weight has not changed. I encouraged decreased caloric intake and increased physical activity.  Clinical follow-up in 6-8 weeks to make sure her blood pressure is adequately controlled, intensity of diuretic therapy is sufficient to minimize dyspnea, and that no medication side effects are present.  Prolonged office visit today. Greater than 50% of the time was spent in counseling and explanation of medication indications, LV function, stent locations, and risk factor modification.  Medication Adjustments/Labs and Tests Ordered: Current medicines are reviewed at length with the patient today.  Concerns regarding medicines are outlined above.  Medication changes, Labs and Tests ordered today are listed in the Patient Instructions below. Patient Instructions  Medication Instructions:  1) DISCONTINUE Furosemide 2) DISCONTINUE plain Lisinopril 3) START Lisinopril/Hydrochlorothiazide 10/12.5mg  once daily.  Labwork: BMET in 7-10 days  Testing/Procedures: None  Follow-Up: Your physician recommends that you schedule a follow-up appointment in: 4-6 weeks with Dr. Tamala Julian.   Any Other Special Instructions Will Be Listed Below (If Applicable).     If you need a refill on your cardiac medications before your next appointment, please call your pharmacy.      Signed, Sinclair Grooms, MD  06/04/2016 5:08 PM    [facility] [address], [place], [place]  [address] Phone: [phone]; Fax: [phone]

## 2016-06-10 ENCOUNTER — Ambulatory Visit (INDEPENDENT_AMBULATORY_CARE_PROVIDER_SITE_OTHER): Payer: 59 | Admitting: Gastroenterology

## 2016-06-10 ENCOUNTER — Encounter: Payer: Self-pay | Admitting: Gastroenterology

## 2016-06-10 VITALS — BP 140/78 | HR 96 | Ht 68.5 in | Wt 247.1 lb

## 2016-06-10 DIAGNOSIS — Z79899 Other long term (current) drug therapy: Secondary | ICD-10-CM | POA: Diagnosis not present

## 2016-06-10 DIAGNOSIS — K219 Gastro-esophageal reflux disease without esophagitis: Secondary | ICD-10-CM | POA: Diagnosis not present

## 2016-06-10 DIAGNOSIS — I252 Old myocardial infarction: Secondary | ICD-10-CM | POA: Diagnosis not present

## 2016-06-10 DIAGNOSIS — R1013 Epigastric pain: Secondary | ICD-10-CM

## 2016-06-10 DIAGNOSIS — G8929 Other chronic pain: Secondary | ICD-10-CM | POA: Diagnosis not present

## 2016-06-10 DIAGNOSIS — E119 Type 2 diabetes mellitus without complications: Secondary | ICD-10-CM | POA: Diagnosis not present

## 2016-06-10 DIAGNOSIS — K5900 Constipation, unspecified: Secondary | ICD-10-CM

## 2016-06-10 DIAGNOSIS — Z955 Presence of coronary angioplasty implant and graft: Secondary | ICD-10-CM | POA: Diagnosis not present

## 2016-06-10 DIAGNOSIS — H109 Unspecified conjunctivitis: Secondary | ICD-10-CM | POA: Diagnosis not present

## 2016-06-10 DIAGNOSIS — Z7982 Long term (current) use of aspirin: Secondary | ICD-10-CM | POA: Diagnosis not present

## 2016-06-10 DIAGNOSIS — E7800 Pure hypercholesterolemia, unspecified: Secondary | ICD-10-CM | POA: Diagnosis not present

## 2016-06-10 DIAGNOSIS — R1310 Dysphagia, unspecified: Secondary | ICD-10-CM

## 2016-06-10 DIAGNOSIS — Z7902 Long term (current) use of antithrombotics/antiplatelets: Secondary | ICD-10-CM | POA: Diagnosis not present

## 2016-06-10 DIAGNOSIS — Z794 Long term (current) use of insulin: Secondary | ICD-10-CM | POA: Diagnosis not present

## 2016-06-10 MED ORDER — PANTOPRAZOLE SODIUM 40 MG PO TBEC: 40.0000 mg | DELAYED_RELEASE_TABLET | Freq: Two times a day (BID) | ORAL | 3 refills | Status: DC

## 2016-06-10 NOTE — Progress Notes (Signed)
HPI :  36 y.o. female with a history ofuncontrolled DM, HTN,morbid obesity, tobacco abuse, MR/TR and CAD s/p DES x2 to mLAD (05/2015) and DES to dLAD (03/2016) who presents for symptoms of reflux and abdominal pain.  Patient having trouble with heartburn / epigastric pain. She has been having pyrosis/ causing chest pain, burning throat for the past year or so at least. She has dysphagia in the sternal notch. She reports mostly occurs with liquids, not much with solids. She also feels discomfort in her upper abdomen. Present 24/7. Eating makes symptoms worse, usually instanttaneously and lasts for hours. She also has nocturnal symptoms. [place] based foods cause her to have symptoms most.  She has been on protonix 40mg  once daily for 3 months or so. She doesn't feel this has helped her too much. She has not been on any other regimen. She takes TUMS routinely which do help her symptoms. She has been on omeprazole in the past which didn't help too much.   She reports chronic constipation - she has a bowel movement once every few months if she doesn't take anything. Passing hard stools. No blood in the stools. Symptoms ongoing "forever". She is using OTC regimen. She reports Miralax has helped her. She takes it once to twice every day. At most she will have a BM once per week.   She had a MI April 2017, since has had 3 stents placed in her heart, the last was February of 2018. She takes plavix routinely as well as aspirin. Last echo 03/2016, EF 55-60%.  She tylenol, but denies NSAID use.  No FH of esophageal or colon cancer.   No prior upper endoscopy.  No prior colonoscopy.        Past Medical History:  Diagnosis Date  . CAD (coronary artery disease)    a. LHC on 06/21/15 which showed 75% occl mid-dist LCx, 40% occl mRCA, 99% mid-dist LAD s/p DES and 50% occl mLAD s/p DES (overlapping stents) and significant LV dysfunction with anteroapical HK; b. 06/2015 Myoview: EF 55-65%, no  ischemia/infarct.  . CHF (congestive heart failure) ([place])   . GERD (gastroesophageal reflux disease)   . Hyperlipidemia   . Hypertension   . Hypothyroidism   . Myocardial stunning (HCC)    a. EF 45% on 2D ECHO on 06/21/15 and severe LV dysfunction on LV gram by cath. Repeat 2D ECHO with EF 55-60% and mild HK of the apicoseptal myocardium.  [name] Neuropraxia of right median nerve    a. suspected after right radial cath. will follow with neuro outpatient if does not resolve.   . NSTEMI (non-ST elevated myocardial infarction) [facility]) 05/2015   [facility]  . Obesity   . 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 05/2015 Following PCI (radial access)-->conservatively managed.  . Tobacco abuse   . Tricuspid regurgitation   . Type II diabetes mellitus ([place])    a. Type II  . Vitamin D deficiency      Past Surgical History:  Procedure Laterality Date  . CARDIAC CATHETERIZATION N/A 06/21/2015   Procedure: Left Heart Cath and Coronary Angiography;  Surgeon: Belva Crome, MD; pLAD 99%, CFX 75%, RCA 40%, EF 35%   . CARDIAC CATHETERIZATION N/A 06/21/2015   Procedure: Coronary Stent Intervention;  Surgeon: Belva Crome, MD;  Promus Premier DES, 3.0 x 20 and 3.5 x 32 mm stents to the LAD  . CARDIAC CATHETERIZATION N/A 06/21/2015   Procedure: Intravascular Ultrasound/IVUS;  Surgeon: Belva Crome,  MD;  Location: [place] [facility];  Service: Cardiovascular;  Laterality: N/A;  LAD  . CARDIAC CATHETERIZATION  06/22/2015   Procedure: Coronary/Graft Angiography;  Surgeon: Peter M Martinique, MD;  Location: [place] [facility];  Service: Cardiovascular;;  . CORONARY STENT INTERVENTION N/A 04/02/2016   Procedure: Coronary Stent Intervention;  Surgeon: Jettie Booze, MD;  Location: [place] [facility];  Service: Cardiovascular;  Laterality: N/A;  . LEFT HEART CATH AND CORONARY ANGIOGRAPHY N/A 04/02/2016   Procedure: Left Heart Cath and Coronary Angiography;  Surgeon: Jettie Booze, MD;  Location: [place] [facility];  Service: Cardiovascular;  Laterality: N/A;   Family History  Problem Relation Age of Onset  . Diabetes Mother   . Healthy Father   . Healthy Sister   . Healthy Brother   . Diabetes Maternal Grandmother   . Heart disease Maternal Grandmother   . Diabetes Maternal Aunt     x 2  . Heart attack Neg Hx   . Heart failure Neg Hx   . Stroke Neg Hx   . Hyperlipidemia Neg Hx   . Hypertension Neg Hx    Social History  Substance Use Topics  . Smoking status: Former Smoker    Packs/day: 0.50    Years: 23.00    Types: Cigarettes    Quit date: 04/01/2016  . Smokeless tobacco: Never Used  . Alcohol use No   Current Outpatient Prescriptions  Medication Sig Dispense Refill  . acetaminophen (TYLENOL) 500 MG tablet Take 500 mg by mouth every 6 (six) hours as needed for headache (pain).    [name] albuterol (PROVENTIL HFA;VENTOLIN HFA) 108 (90 Base) MCG/ACT inhaler Inhale 2 puffs into the lungs every 6 (six) hours as needed for wheezing or shortness of breath. 1 Inhaler 0  . aspirin 81 MG EC tablet Take 1 tablet (81 mg total) by mouth daily. 90 tablet 3  . atorvastatin (LIPITOR) 40 MG tablet Take 1 tablet (40 mg total) by mouth daily at 6 PM. 90 tablet 3  . budesonide-formoterol (SYMBICORT) 160-4.5 MCG/ACT inhaler Inhale 2 puffs into the lungs 2 (two) times daily. 1 Inhaler 11  . clopidogrel (PLAVIX) 75 MG tablet Take 1 tablet (75 mg total) by mouth daily. 90 tablet 3  . ferrous gluconate (FERGON) 324 MG tablet Take 1 tablet (324 mg total) by mouth daily with breakfast. 90 tablet 1  . Insulin Glargine (LANTUS SOLOSTAR) 100 UNIT/ML Solostar Pen Inject 10 Units into the skin daily at 10 pm. 5 pen 5  . Insulin Pen Needle (PEN NEEDLES) 32G X 4 MM MISC 1 each by Does not apply route daily. Use with Lantus pen 100 each 5  . lisinopril-hydrochlorothiazide (PRINZIDE,ZESTORETIC) 10-12.5 MG tablet Take 1 tablet by mouth daily. 90 tablet 3  . metFORMIN (GLUCOPHAGE) 1000 MG tablet Take 1 tablet (1,000 mg total)  by mouth 2 (two) times daily with a meal. 180 tablet 3  . metoprolol (LOPRESSOR) 50 MG tablet Take 1 tablet (50 mg total) by mouth 2 (two) times daily. 180 tablet 1  . nicotine (NICODERM CQ - DOSED IN MG/24 HOURS) 21 mg/24hr patch Place 1 patch (21 mg total) onto the skin daily. 28 patch 0  . nitroGLYCERIN (NITROSTAT) 0.4 MG SL tablet Place 1 tablet (0.4 mg total) under the tongue every 5 (five) minutes as needed for chest pain. 25 tablet 3  . pantoprazole (PROTONIX) 40 MG tablet Take 1 tablet (40 mg total) by mouth daily. 30 tablet 6  . Vitamin  D, Ergocalciferol, (DRISDOL) 50000 units CAPS capsule Take 1 capsule (50,000 Units total) by mouth every 7 (seven) days. 12 capsule 3   No current facility-administered medications for this visit.    No Known Allergies   Review of Systems: All systems reviewed and negative except where noted in HPI.   Lab Results  Component Value Date   WBC 8.7 04/10/2016   HGB 11.2 (L) 04/03/2016   HCT 39.3 04/10/2016   MCV 91 04/10/2016   PLT 263 04/10/2016    Lab Results  Component Value Date   CREATININE 0.88 04/10/2016   BUN 8 04/10/2016   NA 137 04/10/2016   K 4.1 04/10/2016   CL 95 (L) 04/10/2016   CO2 23 04/10/2016    Lab Results  Component Value Date   ALT 10 01/25/2016   AST 14 01/25/2016   ALKPHOS 72 01/25/2016   BILITOT 0.4 01/25/2016   Physical Exam: BP 140/78 (BP Location: Left Arm, Patient Position: Sitting, Cuff Size: Large)   Pulse 96   Ht 5' 8.5" (1.74 m) Comment: height measured without shoes  Wt 247 lb 2 oz (112.1 kg)   BMI 37.03 kg/m  Constitutional: Pleasant,well-developed, female in no acute distress. HEENT: Normocephalic and atraumatic. Conjunctivae are normal. No scleral icterus. Neck supple.  Cardiovascular: Normal rate, regular rhythm. 2/6 SEM Pulmonary/chest: Effort normal and breath sounds normal. No wheezing, rales or rhonchi. Abdominal: Soft, protuberant, mild epigastric TTP without rebound or guarding.There  are no masses palpable. No hepatomegaly. Extremities: no edema Lymphadenopathy: No cervical adenopathy noted. Neurological: Alert and oriented to person place and time. Skin: Skin is warm and dry. No rashes noted. Psychiatric: Normal mood and affect. Behavior is normal.   ASSESSMENT AND PLAN: 36 year old female with significant cardiac history as outlined above, here for new patient assessment of the following issues.  GERD / dysphagia / chronic epigastric pain - symptoms ongoing for at least a year. Given the amount of reflux she reports quite possible her epigastric pain is due to reflux / esophagitis, while H. pylori gastritis also possible. Biliary colic seems less likely given her description of symptoms. Her dysphagia to liquids and not solids raises the possibility for motility disorder. I would normally recommend an upper endoscopy with empiric dilation given these symptoms however given she recently had a coronary stent placed 2 months ago, we'll initially workup with less invasive modality. In this light I offered her barium swallow, assess for stricture stenosis, assess motility. In the interim we'll increase her Protonix to 40 mg twice daily and she can take Gaviscon as needed for breakthrough reflux. We discussed H. pylori testing in regards to epigastric pain, she doesn't want to stop PPI for stool based testing or breath test, this will screen for with serology although counseled her it is not as accurate for an active infection. Pending her course we may also consider diagnostic EGD while on plavix and or 24-hour pH impedance testing / manometry.  Chronic constipation - long-standing symptoms, no alarm features. Using MiraLAX with some benefit but says significant symptoms persist. We'll try her on Linzess 168mcg daily. I gave her a one week sample to see if this helps. If it works she will call back for full prescription, if not will consider other options. She agreed.  [place] Cellar, MD [facility] Pager [phone]  CC: Tysinger, Camelia Eng, PA-C

## 2016-06-10 NOTE — Patient Instructions (Signed)
If you are age 38 or older, your body mass index should be between 23-30. Your Body mass index is 37.03 kg/m. If this is out of the aforementioned range listed, please consider follow up with your Primary Care Provider.  If you are age 38 or younger, your body mass index should be between 19-25. Your Body mass index is 37.03 kg/m. If this is out of the aformentioned range listed, please consider follow up with your Primary Care Provider.   We have sent the following medications to your pharmacy for you to pick up at your convenience:  Protonix  You have been given samples of Linzess to take once daily for one week. If these are helpful please call us for a prescription.  You have been scheduled for a Barium Esophogram at [facility] (1st floor of the hospital) on  April 25th at 11:00am. Please arrive 15 minutes prior to your appointment for registration. Make certain not to have anything to eat or drink 3 hours prior to your test. If you need to reschedule for any reason, please contact radiology at [phone] to do so. __________________________________________________________________ A barium swallow is an examination that concentrates on views of the esophagus. This tends to be a double contrast exam (barium and two liquids which, when combined, create a gas to distend the wall of the oesophagus) or single contrast (non-ionic iodine based). The study is usually tailored to your symptoms so a good history is essential. Attention is paid during the study to the form, structure and configuration of the esophagus, looking for functional disorders (such as aspiration, dysphagia, achalasia, motility and reflux) EXAMINATION You may be asked to change into a gown, depending on the type of swallow being performed. A radiologist and radiographer will perform the procedure. The radiologist will advise you of the type of contrast selected for your procedure and direct you during the exam. You  will be asked to stand, sit or lie in several different positions and to hold a small amount of fluid in your mouth before being asked to swallow while the imaging is performed .In some instances you may be asked to swallow barium coated marshmallows to assess the motility of a solid food bolus. The exam can be recorded as a digital or video fluoroscopy procedure. POST PROCEDURE It will take 1-2 days for the barium to pass through your system. To facilitate this, it is important, unless otherwise directed, to increase your fluids for the next 24-48hrs and to resume your normal diet.  This test typically takes about 30 minutes to perform. __________________________________________________________________________________  Your physician has requested that you go to the basement for the following lab work before leaving today:  H. Pylori serum  Thank you.

## 2016-06-11 DIAGNOSIS — I5032 Chronic diastolic (congestive) heart failure: Secondary | ICD-10-CM | POA: Diagnosis not present

## 2016-06-11 MED FILL — METOPROLOL TARTRATE 50 MG T: 50 | 90 days supply | Qty: 180 | Fill #2

## 2016-06-11 MED FILL — FERROUS GLUCONATE 324 MG TA: 324 (38 FE) | 90 days supply | Qty: 90 | Fill #0

## 2016-06-18 ENCOUNTER — Ambulatory Visit (HOSPITAL_COMMUNITY)
Admission: RE | Admit: 2016-06-18 | Discharge: 2016-06-18 | Disposition: A | Discharge status: Home / Self Care | Payer: 59 | Source: Ambulatory Visit | Attending: Gastroenterology | Admitting: Gastroenterology

## 2016-06-18 DIAGNOSIS — G8929 Other chronic pain: Secondary | ICD-10-CM | POA: Insufficient documentation

## 2016-06-18 DIAGNOSIS — R12 Heartburn: Secondary | ICD-10-CM | POA: Diagnosis not present

## 2016-06-18 DIAGNOSIS — K5900 Constipation, unspecified: Secondary | ICD-10-CM | POA: Insufficient documentation

## 2016-06-18 DIAGNOSIS — R1310 Dysphagia, unspecified: Secondary | ICD-10-CM | POA: Diagnosis not present

## 2016-06-18 DIAGNOSIS — K219 Gastro-esophageal reflux disease without esophagitis: Secondary | ICD-10-CM | POA: Insufficient documentation

## 2016-06-18 DIAGNOSIS — R1013 Epigastric pain: Secondary | ICD-10-CM | POA: Diagnosis not present

## 2016-06-20 MED FILL — ATORVASTATIN 40 MG TABLET: 40 | 30 days supply | Qty: 30 | Fill #2

## 2016-06-23 ENCOUNTER — Encounter: Payer: 59 | Attending: Medical | Admitting: Nutrition

## 2016-06-23 ENCOUNTER — Other Ambulatory Visit: Payer: Self-pay | Admitting: Gastroenterology

## 2016-06-23 VITALS — Ht 69.0 in | Wt 237.8 lb

## 2016-06-23 DIAGNOSIS — Z713 Dietary counseling and surveillance: Secondary | ICD-10-CM | POA: Insufficient documentation

## 2016-06-23 DIAGNOSIS — IMO0002 Reserved for concepts with insufficient information to code with codable children: Secondary | ICD-10-CM

## 2016-06-23 DIAGNOSIS — G8929 Other chronic pain: Secondary | ICD-10-CM | POA: Diagnosis not present

## 2016-06-23 DIAGNOSIS — E1165 Type 2 diabetes mellitus with hyperglycemia: Secondary | ICD-10-CM

## 2016-06-23 DIAGNOSIS — K219 Gastro-esophageal reflux disease without esophagitis: Secondary | ICD-10-CM | POA: Diagnosis not present

## 2016-06-23 DIAGNOSIS — E7800 Pure hypercholesterolemia, unspecified: Secondary | ICD-10-CM

## 2016-06-23 DIAGNOSIS — E118 Type 2 diabetes mellitus with unspecified complications: Secondary | ICD-10-CM

## 2016-06-23 DIAGNOSIS — E66813 Obesity, class 3: Secondary | ICD-10-CM

## 2016-06-23 NOTE — Patient Instructions (Addendum)
Goals 1. Cut out juices 2. Keep walking 30 minutes/ride the bike 3. Continue to follow Plate Method 4. Get A1C 6.5% or less. 5. Lose 2 lbs per month

## 2016-06-23 NOTE — Progress Notes (Signed)
Diabetes Self-Management Education  Visit Type:  Follow-up  Appt. Start Time: 1000  Appt. End Time: 1030  06/23/2016  Jennifer Cervantes, identified by name and date of birth, is a 36 y.o. female with a diagnosis of Diabetes: Type 2.  .Lost 16 lbs. She cut out sodas, sweets, junk food, fried and processed foods. She is trying to eat more vegetables and drinking water. She walks daily for 30 minutes or less. BS are much improved.    BS log BS 100-170's. She is rotating the time she checks before or 2 hours after meals.  Taking 10 units Lantus at nigth and 1000 mg of Metformin BID.   She is committed and making better lifestyle choices. ASSESSMENT  Height 5\' 9"  (1.753 m), weight 237 lb 12.8 oz (107.9 kg). Body mass index is 35.12 kg/m.       Diabetes Self-Management Education - 40/98/11 9147      Complications   Have you had a dilated eye exam in the past 12 months? No  its scheduled for june or july   Have you had a dental exam in the past 12 months? Yes   How many days per week are you checking your feet? 7     Dietary Intake   Breakfast Bagel mini, and 1 egg and lemon water   Lunch Broccoli and rice 1/2 c, watermelon, Hawiian punch SF   Dinner Broccoli, green beans, corn, chicken, Unsweet tea   Beverage(s) water, unsweet tea,      Exercise   Exercise Type Light (walking / raking leaves)   How many days per week to you exercise? 30   How many minutes per day do you exercise? 7   Total minutes per week of exercise 210      Learning Objective:  Patient will have a greater understanding of diabetes self-management. Patient education plan is to attend individual and/or group sessions per assessed needs and concerns.   Plan:   Patient Instructions  Goals 1. Cut out juices 2. Keep walking 30 minutes/ride the bike 3. Continue to follow Plate Method 4. Get A1C 6.5% or less. 5. Lose 2 lbs per month     Expected Outcomes:    Improved blood sugars, reduced  complications and increased knowledge.  Education material provided: Meal plan card, My Plate and Carbohydrate counting sheet  If problems or questions, patient to contact team via:  Phone and Email  Future DSME appointment: -   3 months.

## 2016-06-24 LAB — H. PYLORI ANTIBODY, IGG: H. pylori, IgG AbS: <0.8 Index Value (ref 0.00–0.79)

## 2016-06-30 DIAGNOSIS — Z01419 Encounter for gynecological examination (general) (routine) without abnormal findings: Secondary | ICD-10-CM | POA: Diagnosis not present

## 2016-06-30 DIAGNOSIS — Z6835 Body mass index (BMI) 35.0-35.9, adult: Secondary | ICD-10-CM | POA: Diagnosis not present

## 2016-07-02 ENCOUNTER — Telehealth: Payer: Self-pay

## 2016-07-02 ENCOUNTER — Other Ambulatory Visit: Payer: Self-pay

## 2016-07-02 DIAGNOSIS — R1011 Right upper quadrant pain: Secondary | ICD-10-CM

## 2016-07-02 DIAGNOSIS — K219 Gastro-esophageal reflux disease without esophagitis: Secondary | ICD-10-CM

## 2016-07-02 DIAGNOSIS — R1310 Dysphagia, unspecified: Secondary | ICD-10-CM

## 2016-07-02 NOTE — Telephone Encounter (Signed)
Patient aware that she is scheduled for [place] of RUQ on 5/15 at [place], arrive at 0815, NPO after midnight. Patient wishes to reschedule as this is too early for her, given her the number to reschedule. The esophageal manometry and 24 hr pH probe test is scheduled for 5/21 at [facility] arrive at 10:00 for a 10:30 test. Mailed instructions to patient.

## 2016-07-04 ENCOUNTER — Ambulatory Visit (HOSPITAL_COMMUNITY): Payer: 59

## 2016-07-08 ENCOUNTER — Other Ambulatory Visit (HOSPITAL_COMMUNITY): Payer: 59

## 2016-07-11 ENCOUNTER — Ambulatory Visit (HOSPITAL_COMMUNITY)
Admission: RE | Admit: 2016-07-11 | Discharge: 2016-07-11 | Disposition: A | Discharge status: Home / Self Care | Payer: 59 | Source: Ambulatory Visit | Attending: Gastroenterology | Admitting: Gastroenterology

## 2016-07-11 DIAGNOSIS — K760 Fatty (change of) liver, not elsewhere classified: Secondary | ICD-10-CM | POA: Diagnosis not present

## 2016-07-11 DIAGNOSIS — R1011 Right upper quadrant pain: Secondary | ICD-10-CM | POA: Insufficient documentation

## 2016-07-14 ENCOUNTER — Ambulatory Visit (HOSPITAL_COMMUNITY)
Admission: RE | Admit: 2016-07-14 | Discharge: 2016-07-14 | Disposition: A | Discharge status: Home / Self Care | Payer: 59 | Source: Ambulatory Visit | Attending: Gastroenterology | Admitting: Gastroenterology

## 2016-07-14 ENCOUNTER — Encounter (HOSPITAL_COMMUNITY)
Admission: RE | Disposition: A | Discharge status: Home / Self Care | Payer: Self-pay | Source: Ambulatory Visit | Attending: Gastroenterology

## 2016-07-14 ENCOUNTER — Encounter (HOSPITAL_COMMUNITY): Payer: Self-pay

## 2016-07-14 DIAGNOSIS — K219 Gastro-esophageal reflux disease without esophagitis: Secondary | ICD-10-CM

## 2016-07-14 DIAGNOSIS — R1319 Other dysphagia: Secondary | ICD-10-CM

## 2016-07-14 DIAGNOSIS — R1310 Dysphagia, unspecified: Secondary | ICD-10-CM | POA: Diagnosis not present

## 2016-07-14 HISTORY — PX: 24 HOUR PH STUDY: SHX5419

## 2016-07-14 HISTORY — PX: ESOPHAGEAL MANOMETRY: SHX5429

## 2016-07-14 SURGERY — MANOMETRY, ESOPHAGUS
Anesthesia: Topical

## 2016-07-14 MED ORDER — LIDOCAINE VISCOUS 2 % MT SOLN
OROMUCOSAL | Status: AC
  Filled 2016-07-14: qty 15

## 2016-07-14 SURGICAL SUPPLY — 2 items
FACESHIELD LNG OPTICON STERILE (SAFETY) IMPLANT
GLOVE BIO SURGEON STRL SZ8 (GLOVE) ×4 IMPLANT

## 2016-07-14 NOTE — Progress Notes (Signed)
Esophageal Manometry done per protocol.  Ph impedence study done per protocol.  Pt. Stated she was to remain on protonix medication.  Pt tollerated both procedures well, without complication.  Dr. Silverio Decamp to be notified of study today.  Laverta Baltimore, RN

## 2016-07-15 ENCOUNTER — Other Ambulatory Visit: Payer: Self-pay

## 2016-07-15 ENCOUNTER — Encounter: Payer: Self-pay | Admitting: Medical

## 2016-07-15 ENCOUNTER — Ambulatory Visit (INDEPENDENT_AMBULATORY_CARE_PROVIDER_SITE_OTHER): Payer: 59 | Admitting: Medical

## 2016-07-15 VITALS — BP 114/74 | HR 67

## 2016-07-15 DIAGNOSIS — R942 Abnormal results of pulmonary function studies: Secondary | ICD-10-CM

## 2016-07-15 DIAGNOSIS — R0600 Dyspnea, unspecified: Secondary | ICD-10-CM

## 2016-07-15 DIAGNOSIS — M25519 Pain in unspecified shoulder: Secondary | ICD-10-CM | POA: Diagnosis not present

## 2016-07-15 DIAGNOSIS — R4586 Emotional lability: Secondary | ICD-10-CM | POA: Insufficient documentation

## 2016-07-15 DIAGNOSIS — K219 Gastro-esophageal reflux disease without esophagitis: Secondary | ICD-10-CM

## 2016-07-15 DIAGNOSIS — F39 Unspecified mood [affective] disorder: Secondary | ICD-10-CM | POA: Diagnosis not present

## 2016-07-15 DIAGNOSIS — E559 Vitamin D deficiency, unspecified: Secondary | ICD-10-CM | POA: Diagnosis not present

## 2016-07-15 DIAGNOSIS — E7800 Pure hypercholesterolemia, unspecified: Secondary | ICD-10-CM | POA: Diagnosis not present

## 2016-07-15 DIAGNOSIS — Z87891 Personal history of nicotine dependence: Secondary | ICD-10-CM

## 2016-07-15 DIAGNOSIS — I5032 Chronic diastolic (congestive) heart failure: Secondary | ICD-10-CM | POA: Diagnosis not present

## 2016-07-15 DIAGNOSIS — R202 Paresthesia of skin: Secondary | ICD-10-CM

## 2016-07-15 DIAGNOSIS — E118 Type 2 diabetes mellitus with unspecified complications: Secondary | ICD-10-CM | POA: Diagnosis not present

## 2016-07-15 DIAGNOSIS — K760 Fatty (change of) liver, not elsewhere classified: Secondary | ICD-10-CM

## 2016-07-15 DIAGNOSIS — M79601 Pain in right arm: Secondary | ICD-10-CM

## 2016-07-15 LAB — POCT GLYCOSYLATED HEMOGLOBIN (HGB A1C)

## 2016-07-15 MED ORDER — FLUTICASONE FUROATE-VILANTEROL 200-25 MCG/INH IN AEPB: 1.0000 | INHALATION_SPRAY | Freq: Every day | RESPIRATORY_TRACT | 0 refills | Status: DC

## 2016-07-15 MED ORDER — VITAMIN D 50 MCG (2000 UT) PO TABS: 2000.0000 [IU] | ORAL_TABLET | Freq: Every day | ORAL | 3 refills | Status: DC

## 2016-07-15 MED FILL — VITAMIN D-3 2,000 UNIT TAB: 50 MCG | 100 days supply | Qty: 100 | Fill #0

## 2016-07-15 NOTE — Progress Notes (Signed)
Subjective: Chief Complaint  Patient presents with  . Follow-up    follow up  from vit d level   Here for f/u on several issues.  Last visit Vit D was low, but she states she was already taking weekly prescription Vit D when that level was drawn.  She does eat seafood and gets out in the soon.  Wants to know what else she can do to improve the Vit D  Wants her diabetes marker looked at today.  She is not taking Lantuss due to c/o knots in her belly at injection site.  She is taking Metformin BID.   She does not monitor her sugars.  Trying to eat healthy  She quit smoking 03/2016. Still c/o dyspnea despite taking Symbicort daily and albuterol prn from last visit  She notes that she is getting no resolution with her arm pain and numbness in right arm.  She saw cardiology in 05/2016, and her Lisinopril was increased and HCT added, Lasix was stopped.   She is currently being evaluated by GI for chronic GERD problems.  Has manometry testing with nasal tube in place today and external monitor.  Hx/o right arm neuropraxia - has seen Neurology in the past, told there was nothing they could do other than medication for pain.  Has failed Neurontin.    In general tired of taking medications   Past Medical History:  Diagnosis Date  . CAD (coronary artery disease)    a. LHC on 06/21/15 which showed 75% occl mid-dist LCx, 40% occl mRCA, 99% mid-dist LAD s/p DES and 50% occl mLAD s/p DES (overlapping stents) and significant LV dysfunction with anteroapical HK; b. 06/2015 Myoview: EF 55-65%, no ischemia/infarct.  . CHF (congestive heart failure) ([place])   . GERD (gastroesophageal reflux disease)   . Hyperlipidemia   . Hypertension   . Hypothyroidism   . Myocardial stunning (HCC)    a. EF 45% on 2D ECHO on 06/21/15 and severe LV dysfunction on LV gram by cath. Repeat 2D ECHO with EF 55-60% and mild HK of the apicoseptal myocardium.  [name] Neuropraxia of right median nerve    a. suspected after right radial  cath. will follow with neuro outpatient if does not resolve.   . NSTEMI (non-ST elevated myocardial infarction) [facility]) 05/2015   [facility]  . Obesity   . 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 05/2015 Following PCI (radial access)-->conservatively managed.  . Tobacco abuse   . Tricuspid regurgitation   . Type II diabetes mellitus ([place])    a. Type II  . Vitamin D deficiency     Current Outpatient Prescriptions on File Prior to Visit  Medication Sig Dispense Refill  . acetaminophen (TYLENOL) 500 MG tablet Take 500 mg by mouth every 6 (six) hours as needed for headache (pain).    [name] aspirin 81 MG EC tablet Take 1 tablet (81 mg total) by mouth daily. 90 tablet 3  . atorvastatin (LIPITOR) 40 MG tablet Take 1 tablet (40 mg total) by mouth daily at 6 PM. 90 tablet 3  . clopidogrel (PLAVIX) 75 MG tablet Take 1 tablet (75 mg total) by mouth daily. 90 tablet 3  . ferrous gluconate (FERGON) 324 MG tablet Take 1 tablet (324 mg total) by mouth daily with breakfast. 90 tablet 1  . Insulin Glargine (LANTUS SOLOSTAR) 100 UNIT/ML Solostar Pen Inject 10 Units into the skin daily at 10 pm. 5 pen 5  . Insulin Pen Needle (PEN NEEDLES) 32G X  4 MM MISC 1 each by Does not apply route daily. Use with Lantus pen 100 each 5  . lisinopril-hydrochlorothiazide (PRINZIDE,ZESTORETIC) 10-12.5 MG tablet Take 1 tablet by mouth daily. 90 tablet 3  . metFORMIN (GLUCOPHAGE) 1000 MG tablet Take 1 tablet (1,000 mg total) by mouth 2 (two) times daily with a meal. 180 tablet 3  . metoprolol (LOPRESSOR) 50 MG tablet Take 1 tablet (50 mg total) by mouth 2 (two) times daily. 180 tablet 1  . nicotine (NICODERM CQ - DOSED IN MG/24 HOURS) 21 mg/24hr patch Place 1 patch (21 mg total) onto the skin daily. 28 patch 0  . nitroGLYCERIN (NITROSTAT) 0.4 MG SL tablet Place 1 tablet (0.4 mg total) under the tongue every 5 (five) minutes as needed for chest pain. 25 tablet 3  . pantoprazole (PROTONIX) 40 MG tablet Take 1 tablet (40 mg total) by  mouth daily. 30 tablet 6  . Vitamin D, Ergocalciferol, (DRISDOL) 50000 units CAPS capsule Take 1 capsule (50,000 Units total) by mouth every 7 (seven) days. 12 capsule 3   No current facility-administered medications on file prior to visit.    ROS as in subjective   Objective: BP 114/74   Pulse 67   SpO2 96%   Wt Readings from Last 3 Encounters:  06/23/16 237 lb 12.8 oz (107.9 kg)  06/10/16 247 lb 2 oz (112.1 kg)  06/04/16 246 lb 6.4 oz (111.8 kg)   BP Readings from Last 3 Encounters:  07/15/16 114/74  06/10/16 140/78  06/04/16 126/90   General appearance: alert, no distress, WD/WN,  Left nare with tube in place and external monitor on her left side of body Oral cavity: MMM, no lesions Neck: supple, no lymphadenopathy, no thyromegaly, no masses Heart: RRR, normal S1, S2, no murmurs Lungs: decreased breath sounds,  no wheezes, rhonchi, or rales Pulses: 2+ symmetric, upper and lower extremities, normal cap refill    Assessment: Encounter Diagnoses  Name Primary?  . Chronic diastolic heart failure ([place]) Yes  . Type 2 diabetes mellitus with complication, without long-term current use of insulin ([place])   . Diabetes mellitus with complication ([place])   . Former smoker   . Abnormal PFT   . Pure hypercholesterolemia   . Vitamin D deficiency   . Gastroesophageal reflux disease, esophagitis presence not specified   . Dyspnea, unspecified type   . Mood change ([place])   . Shoulder pain, unspecified chronicity, unspecified laterality   . Right arm pain   . Paresthesia      Plan: Heart failure - reviewed 05/2016 cardiology notes  Diabetes - not compliant with Lantuss or glucose monitoring.  She is taking metformin.   HgbA1C is 7.3 % today.  abnormal PFT, dyspnea - not improving on Symbicort and albuterol.  Begin trial of Breo, gave samples, demonstrated proper use  Vit D deficiency - change to daily 2000 IU Vit D  She remains off tobacco currently  C/t statin daily  GERD  - f/u with GI  Arm pain, neuropraxia - referral to intergerative therapies.   Mood change - literally at the end of the visit as I was about to leave she states that she is tired of taking medications, and may just stop all her medications as she continues to deal with health problems that aren't getting better.   She seems very frustrated, down on mood, but denies SI.  I ended up spending another 15 minutes encouraging her, counseling, trying to help her see the positive in her life,  to seek out purpose in life, and to not give up.  I encouraged her to c/t her medications .  dicussed that some of her conditions are genetic and out of her control.   Discussed benefits of eating healthy, exercising, keeping mental healthy strong.    I strongly encouraged her to consider counseling. Gave list of counselors.  I asked her to f/u in a  week by phone.  Brendia was seen today for follow-up.  Diagnoses and all orders for this visit:  Chronic diastolic heart failure ([place])  Type 2 diabetes mellitus with complication, without long-term current use of insulin (HCC) -     HgB A1c  Diabetes mellitus with complication (HCC)  Former smoker  Abnormal PFT  Pure hypercholesterolemia  Vitamin D deficiency  Gastroesophageal reflux disease, esophagitis presence not specified  Dyspnea, unspecified type  Mood change (HCC)  Shoulder pain, unspecified chronicity, unspecified laterality -     Ambulatory referral to Physical Therapy  Right arm pain  Paresthesia  Other orders -     Cholecalciferol (VITAMIN D) 2000 units tablet; Take 1 tablet (2,000 Units total) by mouth daily. -     fluticasone furoate-vilanterol (BREO ELLIPTA) 200-25 MCG/INH AEPB; Inhale 1 puff into the lungs daily.  Spent > 45 minutes face to face with patient in discussion of symptoms, evaluation, plan and recommendations.

## 2016-07-15 NOTE — Progress Notes (Signed)
Cardiology Office Note    Date:  07/16/2016   ID:  Jennifer Cervantes, Jennifer Cervantes 03-11-1980, MRN [id number]  PCP:  Carlena Hurl, PA-C  Cardiologist: Sinclair Grooms, MD   Chief Complaint  Patient presents with  . 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 Also developed Neuropraxis of the right hand following her second radial coronary intervention in 2017.   Over the past week the patient does have multiple episodes of chest tightness with left arm discomfort responsive to sublingual nitroglycerin. This is very similar to her most recent complaints in February when stenting was performed on the distal LAD. Episodes are nonexertional, rather occurring spontaneously. 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 She denies orthopnea and PND. She discontinued tobacco use.  Past Medical History:  Diagnosis Date  . CAD (coronary artery disease)    a. LHC on 06/21/15 which showed 75% occl mid-dist LCx, 40% occl mRCA, 99% mid-dist LAD s/p DES and 50% occl mLAD s/p DES (overlapping stents) and significant LV dysfunction with anteroapical HK; b. 06/2015 Myoview: EF 55-65%, no ischemia/infarct.  . CHF (congestive heart failure) ([place])   . GERD (gastroesophageal reflux disease)   . Hyperlipidemia   . Hypertension   . Hypothyroidism   . Myocardial stunning (HCC)    a. EF 45% on 2D ECHO on 06/21/15 and severe LV dysfunction on LV gram by cath. Repeat 2D ECHO with EF 55-60% and mild HK of the apicoseptal myocardium.  [name] Neuropraxia of right median nerve    a. suspected after right radial cath. will follow with neuro outpatient if does not resolve.   . NSTEMI (non-ST elevated myocardial  infarction) [facility]) 05/2015   [facility]  . Obesity   . 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 05/2015 Following PCI (radial access)-->conservatively managed.  . Tobacco abuse   . Tricuspid regurgitation   . Type II diabetes mellitus ([place])    a. Type II  . Vitamin D deficiency     Past Surgical History:  Procedure Laterality Date  . [place] STUDY N/A 07/14/2016   Procedure: [place] STUDY;  Surgeon: Mauri Pole, MD;  Location: [facility] ENDOSCOPY;  Service: Endoscopy;  Laterality: N/A;  . CARDIAC CATHETERIZATION N/A 06/21/2015   Procedure: Left Heart Cath and Coronary Angiography;  Surgeon: Belva Crome, MD; pLAD 99%, CFX 75%, RCA 40%, EF 35%   . CARDIAC CATHETERIZATION N/A 06/21/2015   Procedure: Coronary Stent Intervention;  Surgeon: Belva Crome, MD;  Promus Premier DES, 3.0 x 20 and 3.5 x 32 mm stents to the LAD  . CARDIAC CATHETERIZATION N/A 06/21/2015   Procedure: Intravascular Ultrasound/IVUS;  Surgeon: Belva Crome, MD;  Location: [place] [facility];  Service: Cardiovascular;  Laterality: N/A;  LAD  . CARDIAC CATHETERIZATION  06/22/2015   Procedure: Coronary/Graft Angiography;  Surgeon: Peter M Martinique, MD;  Location: [place] [facility];  Service: Cardiovascular;;  . CORONARY STENT INTERVENTION N/A 04/02/2016   Procedure: Coronary Stent Intervention;  Surgeon: Jettie Booze, MD;  Location: [place] [facility];  Service: Cardiovascular;  Laterality: N/A;  . ESOPHAGEAL  MANOMETRY N/A 07/14/2016   Procedure: ESOPHAGEAL MANOMETRY (EM);  Surgeon: Mauri Pole, MD;  Location: [facility];  Service: Endoscopy;  Laterality: N/A;  . LEFT HEART CATH AND CORONARY ANGIOGRAPHY N/A 04/02/2016   Procedure: Left Heart Cath and Coronary Angiography;  Surgeon: Jettie Booze, MD;  Location: [place] [facility] LAB;  Service: Cardiovascular;  Laterality: N/A;    Current Medications: Outpatient Medications Prior to Visit  Medication Sig Dispense Refill  . acetaminophen (TYLENOL) 500  MG tablet Take 500 mg by mouth every 6 (six) hours as needed for headache (pain).    [name] aspirin 81 MG EC tablet Take 1 tablet (81 mg total) by mouth daily. 90 tablet 3  . atorvastatin (LIPITOR) 40 MG tablet Take 1 tablet (40 mg total) by mouth daily at 6 PM. 90 tablet 3  . Cholecalciferol (VITAMIN D) 2000 units tablet Take 1 tablet (2,000 Units total) by mouth daily. 90 tablet 3  . ferrous gluconate (FERGON) 324 MG tablet Take 1 tablet (324 mg total) by mouth daily with breakfast. 90 tablet 1  . fluticasone furoate-vilanterol (BREO ELLIPTA) 200-25 MCG/INH AEPB Inhale 1 puff into the lungs daily. 28 each 0  . Insulin Glargine (LANTUS SOLOSTAR) 100 UNIT/ML Solostar Pen Inject 10 Units into the skin daily at 10 pm. 5 pen 5  . Insulin Pen Needle (PEN NEEDLES) 32G X 4 MM MISC 1 each by Does not apply route daily. Use with Lantus pen 100 each 5  . lisinopril-hydrochlorothiazide (PRINZIDE,ZESTORETIC) 10-12.5 MG tablet Take 1 tablet by mouth daily. 90 tablet 3  . metFORMIN (GLUCOPHAGE) 1000 MG tablet Take 1 tablet (1,000 mg total) by mouth 2 (two) times daily with a meal. 180 tablet 3  . metoprolol (LOPRESSOR) 50 MG tablet Take 1 tablet (50 mg total) by mouth 2 (two) times daily. 180 tablet 1  . nicotine (NICODERM CQ - DOSED IN MG/24 HOURS) 21 mg/24hr patch Place 1 patch (21 mg total) onto the skin daily. 28 patch 0  . nitroGLYCERIN (NITROSTAT) 0.4 MG SL tablet Place 1 tablet (0.4 mg total) under the tongue every 5 (five) minutes as needed for chest pain. 25 tablet 3  . pantoprazole (PROTONIX) 40 MG tablet Take 1 tablet (40 mg total) by mouth daily. 30 tablet 6  . Vitamin D, Ergocalciferol, (DRISDOL) 50000 units CAPS capsule Take 1 capsule (50,000 Units total) by mouth every 7 (seven) days. 12 capsule 3  . clopidogrel (PLAVIX) 75 MG tablet Take 1 tablet (75 mg total) by mouth daily. (Patient not taking: Reported on 07/16/2016) 90 tablet 3   No facility-administered medications prior to visit.       Allergies:   Patient has no known allergies.   Social History   Social History  . Marital status: Single    Spouse name: N/A  . Number of children: 4  . Years of education: 12   Occupational History  . CNA [employer]   Social History Main Topics  . Smoking status: Former Smoker    Packs/day: 0.50    Years: 23.00    Types: Cigarettes    Quit date: 04/01/2016  . Smokeless tobacco: Never Used  . Alcohol use No  . Drug use: No  . Sexual activity: Not Asked   Other Topics Concern  . None   Social History Narrative   Lives in [place], works nights at [employer], [place]   Right-handed   Drinks 1 soda a day   Exercises some with walking   Lives  with her 72 yo son   Likes her space, doesn't want a significant other   03/2016     Family History:  The patient's family history includes Diabetes in her maternal aunt, maternal grandmother, and mother; Healthy in her brother, father, and sister; Heart disease in her maternal grandmother.   ROS:   Please see the history of present illness.    Very limited related to shortness of breath and recurring chest pressure.  All other systems reviewed and are negative.   PHYSICAL EXAM:   VS:  BP 116/78 (BP Location: Left Arm)   Pulse 81   Ht 5\' 9"  (1.753 m)   Wt 237 lb 12.8 oz (107.9 kg)   BMI 35.12 kg/m    GEN: Well nourished, well developed, in no acute distress. Moderate obesity.  HEENT: normal  Neck: no JVD, carotid bruits, or masses Cardiac: RRR; no murmurs, rubs, or gallops,no edema.  Respiratory:  clear to auscultation bilaterally, normal work of breathing GI: soft, nontender, nondistended, + BS MS: no deformity or atrophy . Absent right radial pulse. Weakness in the right hand. Skin: warm and dry, no rash Neuro:  Alert and Oriented x 3, Strength and sensation are intact Psych: euthymic mood, full affect  Wt Readings from Last 3 Encounters:  07/16/16 237 lb 12.8 oz (107.9 kg)  06/23/16 237 lb 12.8 oz (107.9 kg)  06/10/16  247 lb 2 oz (112.1 kg)      Studies/Labs Reviewed:   EKG:  EKG  Heart rate is 98 bpm, biatrial abnormality is noted, prominent voltage is noted.  Recent Labs: 01/25/2016: ALT 10 04/03/2016: Hemoglobin 11.2 04/10/2016: BUN 8; Creatinine, Ser 0.88; NT-Pro BNP 21; Platelets 263; Potassium 4.1; Sodium 137 04/15/2016: TSH 0.56   Lipid Panel    Component Value Date/Time   CHOL 140 01/25/2016 1015   TRIG 63 01/25/2016 1015   HDL 43 (L) 01/25/2016 1015   CHOLHDL 3.3 01/25/2016 1015   VLDL 13 01/25/2016 1015   [place] 84 01/25/2016 1015    Additional studies/ records that were reviewed today include:   Coronary angioplasty stent implantation 2017, February:  Coronary Diagrams   Diagnostic Diagram       Post-Intervention Diagram          ASSESSMENT:    1. Coronary artery disease involving native coronary artery of native heart with angina pectoris with documented spasm ([place])   2. Chronic diastolic heart failure (HCC)   3. Other hyperlipidemia   4. Tobacco abuse   5. Dyspnea, unspecified type   6. Neurapraxia of right median nerve, sequela      PLAN:  In order of problems listed above:  1. Recurrent angina and what seems an unstable pattern requiring multiple sublingual nitroglycerin over the past week for pain occurring at rest. The discomfort with radiation to the left shoulder is similar to prior episodes. I have recommended repeat coronary angiography which will be done as soon as possible. If recurrent angina uncontrolled by nitrates, report to the emergency room. 2. Does complain of dyspnea on exertion but on exam there is no evidence of volume overload. 3. Being treated with high intensity statin therapy, atorvastatin 40 mg per day. 4. Smoking is been discontinued completely. 5. Dyspnea may be an anginal equivalent. 6. Continues to complain of disabling pain and weakness in her right hand. This particular diagnosis was described to median nerve injury at the time  of radial artery catheterization which was performed twice within 24 hours in 2017. We  will arrange for him to have repeat neurological evaluation and perhaps physical therapy as directed by neurology consultant.  The patient was counseled to undergo left heart catheterization, coronary angiography, and possible percutaneous coronary intervention with stent implantation. The procedural risks and benefits were discussed in detail. The risks discussed included death, stroke, myocardial infarction, life-threatening bleeding, limb ischemia, kidney injury, allergy, and possible emergency cardiac surgery. The risk of these significant complications were estimated to occur less than 1% of the time. After discussion, the patient has agreed to proceed.  She is having difficulty working the cause of chest pain and dyspnea. She also feels hampered by the right hand problem.  Extended office visit, with greater than 50% of the time spent face-to-face with the patient outlining management strategies and counseling.  Medication Adjustments/Labs and Tests Ordered: Current medicines are reviewed at length with the patient today.  Concerns regarding medicines are outlined above.  Medication changes, Labs and Tests ordered today are listed in the Patient Instructions below. Patient Instructions  Medication Instructions:  None  Labwork: BMET, CBC and INR today  Testing/Procedures: Your physician has requested that you have a cardiac catheterization. Cardiac catheterization is used to diagnose and/or treat various heart conditions. Doctors may recommend this procedure for a number of different reasons. The most common reason is to evaluate chest pain. Chest pain can be a symptom of coronary artery disease (CAD), and cardiac catheterization can show whether plaque is narrowing or blocking your heart's arteries. This procedure is also used to evaluate the valves, as well as measure the blood flow and oxygen levels in  different parts of your heart. For further information please visit HugeFiesta.tn. Please follow instruction sheet, as given.    Follow-Up: Your physician recommends that you schedule a follow-up appointment in: 2 weeks with a PA or NP.   Any Other Special Instructions Will Be Listed Below (If Applicable).     If you need a refill on your cardiac medications before your next appointment, please call your pharmacy.      Signed, Sinclair Grooms, MD  07/16/2016 10:20 AM    [facility] [place], [place], [place]  [address] Phone: [phone]; Fax: [phone]

## 2016-07-15 NOTE — Patient Instructions (Signed)
Counseling Services  [facility] [address], [place], [place] [address] [phone]    [facility] [phone]  [email or web address] [address], [place], [place] [address]  Laura Atkinson, therapist  Or Erik Nelson, MA, clinical psychologist    Merrianne M. Leff, therapist [phone] [address] [place], [place] [address]   [facility] [phone] [address], [place], [place] [address]   Jill White-Huffman, therapist [phone] [address], [place], [place] [address]   [facility] [phone] [address], [place], [place] [address]    

## 2016-07-16 ENCOUNTER — Encounter: Payer: Self-pay | Admitting: Interventional Cardiology

## 2016-07-16 ENCOUNTER — Ambulatory Visit (INDEPENDENT_AMBULATORY_CARE_PROVIDER_SITE_OTHER): Payer: 59 | Admitting: Interventional Cardiology

## 2016-07-16 ENCOUNTER — Other Ambulatory Visit: Payer: Self-pay | Admitting: Interventional Cardiology

## 2016-07-16 ENCOUNTER — Encounter: Payer: Self-pay | Admitting: *Deleted

## 2016-07-16 VITALS — BP 116/78 | HR 81 | Ht 69.0 in | Wt 237.8 lb

## 2016-07-16 DIAGNOSIS — I5032 Chronic diastolic (congestive) heart failure: Secondary | ICD-10-CM | POA: Diagnosis not present

## 2016-07-16 DIAGNOSIS — I209 Angina pectoris, unspecified: Secondary | ICD-10-CM

## 2016-07-16 DIAGNOSIS — E784 Other hyperlipidemia: Secondary | ICD-10-CM

## 2016-07-16 DIAGNOSIS — E7849 Other hyperlipidemia: Secondary | ICD-10-CM

## 2016-07-16 DIAGNOSIS — S5411XS Injury of median nerve at forearm level, right arm, sequela: Secondary | ICD-10-CM | POA: Diagnosis not present

## 2016-07-16 DIAGNOSIS — I25110 Atherosclerotic heart disease of native coronary artery with unstable angina pectoris: Secondary | ICD-10-CM

## 2016-07-16 DIAGNOSIS — Z720 Tobacco use: Secondary | ICD-10-CM

## 2016-07-16 DIAGNOSIS — R0600 Dyspnea, unspecified: Secondary | ICD-10-CM

## 2016-07-16 NOTE — Patient Instructions (Signed)
Medication Instructions:  None  Labwork: BMET, CBC and INR today  Testing/Procedures: Your physician has requested that you have a cardiac catheterization. Cardiac catheterization is used to diagnose and/or treat various heart conditions. Doctors may recommend this procedure for a number of different reasons. The most common reason is to evaluate chest pain. Chest pain can be a symptom of coronary artery disease (CAD), and cardiac catheterization can show whether plaque is narrowing or blocking your heart's arteries. This procedure is also used to evaluate the valves, as well as measure the blood flow and oxygen levels in different parts of your heart. For further information please visit HugeFiesta.tn. Please follow instruction sheet, as given.    Follow-Up: Your physician recommends that you schedule a follow-up appointment in: 2 weeks with a PA or NP.   Any Other Special Instructions Will Be Listed Below (If Applicable).     If you need a refill on your cardiac medications before your next appointment, please call your pharmacy.

## 2016-07-17 ENCOUNTER — Ambulatory Visit (HOSPITAL_COMMUNITY)
Admission: RE | Admit: 2016-07-17 | Discharge: 2016-07-17 | Disposition: A | Discharge status: Home / Self Care | Payer: 59 | Source: Ambulatory Visit | Attending: Cardiology | Admitting: Cardiology

## 2016-07-17 ENCOUNTER — Encounter (HOSPITAL_COMMUNITY)
Admission: RE | Disposition: A | Discharge status: Home / Self Care | Payer: Self-pay | Source: Ambulatory Visit | Attending: Cardiology

## 2016-07-17 ENCOUNTER — Encounter (HOSPITAL_COMMUNITY): Payer: Self-pay | Admitting: Gastroenterology

## 2016-07-17 DIAGNOSIS — I25110 Atherosclerotic heart disease of native coronary artery with unstable angina pectoris: Secondary | ICD-10-CM | POA: Insufficient documentation

## 2016-07-17 DIAGNOSIS — I5032 Chronic diastolic (congestive) heart failure: Secondary | ICD-10-CM | POA: Diagnosis not present

## 2016-07-17 DIAGNOSIS — E039 Hypothyroidism, unspecified: Secondary | ICD-10-CM | POA: Insufficient documentation

## 2016-07-17 DIAGNOSIS — I25119 Atherosclerotic heart disease of native coronary artery with unspecified angina pectoris: Secondary | ICD-10-CM

## 2016-07-17 DIAGNOSIS — K219 Gastro-esophageal reflux disease without esophagitis: Secondary | ICD-10-CM | POA: Diagnosis not present

## 2016-07-17 DIAGNOSIS — Z794 Long term (current) use of insulin: Secondary | ICD-10-CM | POA: Diagnosis not present

## 2016-07-17 DIAGNOSIS — I110 Hypertensive heart disease with heart failure: Secondary | ICD-10-CM | POA: Insufficient documentation

## 2016-07-17 DIAGNOSIS — E1165 Type 2 diabetes mellitus with hyperglycemia: Secondary | ICD-10-CM | POA: Insufficient documentation

## 2016-07-17 DIAGNOSIS — Z955 Presence of coronary angioplasty implant and graft: Secondary | ICD-10-CM | POA: Insufficient documentation

## 2016-07-17 DIAGNOSIS — E559 Vitamin D deficiency, unspecified: Secondary | ICD-10-CM | POA: Insufficient documentation

## 2016-07-17 DIAGNOSIS — E784 Other hyperlipidemia: Secondary | ICD-10-CM | POA: Insufficient documentation

## 2016-07-17 DIAGNOSIS — Z87891 Personal history of nicotine dependence: Secondary | ICD-10-CM | POA: Insufficient documentation

## 2016-07-17 DIAGNOSIS — R1310 Dysphagia, unspecified: Secondary | ICD-10-CM

## 2016-07-17 DIAGNOSIS — Z6835 Body mass index (BMI) 35.0-35.9, adult: Secondary | ICD-10-CM | POA: Insufficient documentation

## 2016-07-17 DIAGNOSIS — I252 Old myocardial infarction: Secondary | ICD-10-CM | POA: Insufficient documentation

## 2016-07-17 DIAGNOSIS — S5411XS Injury of median nerve at forearm level, right arm, sequela: Secondary | ICD-10-CM | POA: Diagnosis not present

## 2016-07-17 DIAGNOSIS — I25111 Atherosclerotic heart disease of native coronary artery with angina pectoris with documented spasm: Secondary | ICD-10-CM | POA: Diagnosis not present

## 2016-07-17 DIAGNOSIS — Z7982 Long term (current) use of aspirin: Secondary | ICD-10-CM | POA: Diagnosis not present

## 2016-07-17 DIAGNOSIS — R1319 Other dysphagia: Secondary | ICD-10-CM

## 2016-07-17 DIAGNOSIS — Z7902 Long term (current) use of antithrombotics/antiplatelets: Secondary | ICD-10-CM | POA: Diagnosis not present

## 2016-07-17 DIAGNOSIS — I209 Angina pectoris, unspecified: Secondary | ICD-10-CM

## 2016-07-17 HISTORY — PX: LEFT HEART CATH AND CORONARY ANGIOGRAPHY: CATH118249

## 2016-07-17 LAB — BASIC METABOLIC PANEL
BUN / CREAT RATIO: 9 (ref 9–23)
BUN: 10 mg/dL (ref 6–20)
CO2: 18 mmol/L (ref 18–29)
CREATININE: 1.1 mg/dL — AB (ref 0.57–1.00)
Calcium: 10 mg/dL (ref 8.7–10.2)
Chloride: 99 mmol/L (ref 96–106)
GFR calc Af Amer: 74 mL/min/{1.73_m2} (ref >59)
GFR calc non Af Amer: 64 mL/min/{1.73_m2} (ref >59)
GLUCOSE: 304 mg/dL — AB (ref 65–99)
Potassium: 4.8 mmol/L (ref 3.5–5.2)
Sodium: 134 mmol/L (ref 134–144)

## 2016-07-17 LAB — GLUCOSE, CAPILLARY
GLUCOSE-CAPILLARY: 197 mg/dL — AB (ref 65–99)
GLUCOSE-CAPILLARY: 107 mg/dL — AB (ref 65–99)

## 2016-07-17 LAB — CBC
HEMOGLOBIN: 12.7 g/dL (ref 11.1–15.9)
Hematocrit: 38.1 % (ref 34.0–46.6)
MCH: 31.1 pg (ref 26.6–33.0)
MCHC: 33.3 g/dL (ref 31.5–35.7)
MCV: 93 fL (ref 79–97)
Platelets: 265 10*3/uL (ref 150–379)
RBC: 4.09 x10E6/uL (ref 3.77–5.28)
RDW: 13.8 % (ref 12.3–15.4)
WBC: 10.2 10*3/uL (ref 3.4–10.8)

## 2016-07-17 LAB — PROTIME-INR
INR: 0.9 (ref 0.8–1.2)
Prothrombin Time: 9.9 s (ref 9.1–12.0)

## 2016-07-17 SURGERY — LEFT HEART CATH AND CORONARY ANGIOGRAPHY
Anesthesia: LOCAL

## 2016-07-17 MED ORDER — IOPAMIDOL (ISOVUE-370) INJECTION 76%
INTRAVENOUS | Status: AC
  Filled 2016-07-17: qty 100

## 2016-07-17 MED ORDER — CLOPIDOGREL BISULFATE 75 MG PO TABS: 150.0000 mg | ORAL_TABLET | ORAL | Status: DC

## 2016-07-17 MED ORDER — ONDANSETRON HCL 4 MG/2ML IJ SOLN: 4.0000 mg | Freq: Four times a day (QID) | INTRAMUSCULAR | Status: DC | PRN

## 2016-07-17 MED ORDER — SODIUM CHLORIDE 0.9% FLUSH: 3.0000 mL | Freq: Two times a day (BID) | INTRAVENOUS | Status: DC

## 2016-07-17 MED ORDER — SODIUM CHLORIDE 0.9% FLUSH: 3.0000 mL | INTRAVENOUS | Status: DC | PRN

## 2016-07-17 MED ORDER — SODIUM CHLORIDE 0.9 % WEIGHT BASED INFUSION
1.0000 mL/kg/h | INTRAVENOUS | Status: DC
Start: 2016-07-17 — End: 2016-07-17

## 2016-07-17 MED ORDER — FENTANYL CITRATE (PF) 100 MCG/2ML IJ SOLN
INTRAMUSCULAR | Status: DC | PRN
  Administered 2016-07-17: 25 ug via INTRAVENOUS

## 2016-07-17 MED ORDER — ASPIRIN 81 MG PO CHEW: 81.0000 mg | CHEWABLE_TABLET | ORAL | Status: DC

## 2016-07-17 MED ORDER — SODIUM CHLORIDE 0.9 % WEIGHT BASED INFUSION
3.0000 mL/kg/h | INTRAVENOUS | Status: AC
  Administered 2016-07-17: 3 mL/kg/h via INTRAVENOUS

## 2016-07-17 MED ORDER — SODIUM CHLORIDE 0.9 % IV SOLN: 250.0000 mL | INTRAVENOUS | Status: DC | PRN

## 2016-07-17 MED ORDER — ACETAMINOPHEN 325 MG PO TABS: 650.0000 mg | ORAL_TABLET | ORAL | Status: DC | PRN

## 2016-07-17 MED ORDER — HEPARIN (PORCINE) IN NACL 2-0.9 UNIT/ML-% IJ SOLN
INTRAMUSCULAR | Status: AC
  Filled 2016-07-17: qty 1000

## 2016-07-17 MED ORDER — LIDOCAINE HCL (PF) 1 % IJ SOLN
INTRAMUSCULAR | Status: AC
  Filled 2016-07-17: qty 30

## 2016-07-17 MED ORDER — IOPAMIDOL (ISOVUE-370) INJECTION 76%
INTRAVENOUS | Status: DC | PRN
  Administered 2016-07-17: 70 mL via INTRA_ARTERIAL

## 2016-07-17 MED ORDER — SODIUM CHLORIDE 0.9 % IV SOLN: INTRAVENOUS | Status: AC

## 2016-07-17 MED ORDER — HEPARIN (PORCINE) IN NACL 2-0.9 UNIT/ML-% IJ SOLN
INTRAMUSCULAR | Status: AC | PRN
  Administered 2016-07-17: 1000 mL

## 2016-07-17 MED ORDER — MIDAZOLAM HCL 2 MG/2ML IJ SOLN
INTRAMUSCULAR | Status: DC | PRN
  Administered 2016-07-17: 2 mg via INTRAVENOUS

## 2016-07-17 MED ORDER — FENTANYL CITRATE (PF) 100 MCG/2ML IJ SOLN
INTRAMUSCULAR | Status: AC
  Filled 2016-07-17: qty 2

## 2016-07-17 MED ORDER — MIDAZOLAM HCL 2 MG/2ML IJ SOLN
INTRAMUSCULAR | Status: AC
  Filled 2016-07-17: qty 2

## 2016-07-17 MED ORDER — LIDOCAINE HCL (PF) 1 % IJ SOLN
INTRAMUSCULAR | Status: DC | PRN
  Administered 2016-07-17: 15 mL via INTRADERMAL

## 2016-07-17 SURGICAL SUPPLY — 7 items
CATH INFINITI 5FR MULTPACK ANG (CATHETERS) ×2 IMPLANT
KIT HEART LEFT (KITS) ×2 IMPLANT
PACK CARDIAC CATHETERIZATION (CUSTOM PROCEDURE TRAY) ×2 IMPLANT
SHEATH PINNACLE 5F 10CM (SHEATH) ×2 IMPLANT
TRANSDUCER W/STOPCOCK (MISCELLANEOUS) ×2 IMPLANT
TUBING CIL FLEX 10 FLL-RA (TUBING) IMPLANT
WIRE EMERALD 3MM-J .035X150CM (WIRE) ×2 IMPLANT

## 2016-07-17 NOTE — Discharge Instructions (Signed)
Angiogram, Care After °This sheet gives you information about how to care for yourself after your procedure. Your health care provider may also give you more specific instructions. If you have problems or questions, contact your health care provider. °What can I expect after the procedure? °After the procedure, it is common to have bruising and tenderness at the catheter insertion area. °Follow these instructions at home: °Insertion site care  °· Follow instructions from your health care provider about how to take care of your insertion site. Make sure you: °¨ Wash your hands with soap and water before you change your bandage (dressing). If soap and water are not available, use hand sanitizer. °¨ Change your dressing as told by your health care provider. °¨ Leave stitches (sutures), skin glue, or adhesive strips in place. These skin closures may need to stay in place for 2 weeks or longer. If adhesive strip edges start to loosen and curl up, you may trim the loose edges. Do not remove adhesive strips completely unless your health care provider tells you to do that. °· Do not take baths, swim, or use a hot tub until your health care provider approves. °· You may shower 24-48 hours after the procedure or as told by your health care provider. °¨ Gently wash the site with plain soap and water. °¨ Pat the area dry with a clean towel. °¨ Do not rub the site. This may cause bleeding. °· Do not apply powder or lotion to the site. Keep the site clean and dry. °· Check your insertion site every day for signs of infection. Check for: °¨ Redness, swelling, or pain. °¨ Fluid or blood. °¨ Warmth. °¨ Pus or a bad smell. °Activity  °· Rest as told by your health care provider, usually for 1-2 days. °· Do not lift anything that is heavier than 10 lbs. (4.5 kg) or as told by your health care provider. °· Do not drive for 24 hours if you were given a medicine to help you relax (sedative). °· Do not drive or use heavy machinery while  taking prescription pain medicine. °General instructions  °· Return to your normal activities as told by your health care provider, usually in about a week. Ask your health care provider what activities are safe for you. °· If the catheter site starts bleeding, lie flat and put pressure on the site. If the bleeding does not stop, get help right away. This is a medical emergency. °· Drink enough fluid to keep your urine clear or pale yellow. This helps flush the contrast dye from your body. °· Take over-the-counter and prescription medicines only as told by your health care provider. °· Keep all follow-up visits as told by your health care provider. This is important. °Contact a health care provider if: °· You have a fever or chills. °· You have redness, swelling, or pain around your insertion site. °· You have fluid or blood coming from your insertion site. °· The insertion site feels warm to the touch. °· You have pus or a bad smell coming from your insertion site. °· You have bruising around the insertion site. °· You notice blood collecting in the tissue around the catheter site (hematoma). The hematoma may be painful to the touch. °Get help right away if: °· You have severe pain at the catheter insertion area. °· The catheter insertion area swells very fast. °· The catheter insertion area is bleeding, and the bleeding does not stop when you hold steady pressure on   the area. °· The area near or just beyond the catheter insertion site becomes pale, cool, tingly, or numb. °These symptoms may represent a serious problem that is an emergency. Do not wait to see if the symptoms will go away. Get medical help right away. Call your local emergency services (911 in the [place] Do not drive yourself to the hospital. °Summary °· After the procedure, it is common to have bruising and tenderness at the catheter insertion area. °· After the procedure, it is important to rest and drink plenty of fluids. °· Do not take baths,  swim, or use a hot tub until your health care provider says it is okay to do so. You may shower 24-48 hours after the procedure or as told by your health care provider. °· If the catheter site starts bleeding, lie flat and put pressure on the site. If the bleeding does not stop, get help right away. This is a medical emergency. °This information is not intended to replace advice given to you by your health care provider. Make sure you discuss any questions you have with your health care provider. °Document Released: 08/29/2004 Document Revised: 01/16/2016 Document Reviewed: 01/16/2016 °Elsevier Interactive Patient Education © 2017 [employer] ° °

## 2016-07-17 NOTE — Interval H&P Note (Signed)
History and Physical Interval Note:  07/17/2016 1:39 PM  Jennifer Cervantes  has presented today for surgery, with the diagnosis of cp- Progressive Angina & known CAD  The various methods of treatment have been discussed with the patient and family. After consideration of risks, benefits and other options for treatment, the patient has consented to  Procedure(s): Left Heart Cath and Coronary Angiography (N/A) with possible Percutaneous Coronary Intervention as a surgical intervention .  The patient's history has been reviewed, patient examined, no change in status, stable for surgery.  I have reviewed the patient's chart and labs.  Questions were answered to the patient's satisfaction.    Cath Lab Visit (complete for each Cath Lab visit)  Clinical Evaluation Leading to the Procedure:   ACS: Yes.    Non-ACS:    Anginal Classification: CCS IV  Anti-ischemic medical therapy: Minimal Therapy (1 class of medications)  Non-Invasive Test Results: No non-invasive testing performed  Prior CABG: No previous CABG   Glenetta Hew

## 2016-07-17 NOTE — Progress Notes (Signed)
Site area: Right groin a 5 french arterial sheath was removed  Site Prior to Removal:  Level 0  Pressure Applied For 20 MINUTES    Bedrest Beginning at 1500p  Manual:   Yes.    Patient Status During Pull:  stable  Post Pull Groin Site:  Level 0  Post Pull Instructions Given:  Yes.    Post Pull Pulses Present:  Yes.    Dressing Applied:  Yes.    Comments:  VS remain stable during sheath pull.

## 2016-07-17 NOTE — H&P (View-Only) (Signed)
Cardiology Office Note    Date:  07/16/2016   ID:  Sibel, Khurana 08/12/1980, MRN [id number]  PCP:  Carlena Hurl, PA-C  Cardiologist: Sinclair Grooms, MD   Chief Complaint  Patient presents with  . 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 Also developed Neuropraxis of the right hand following her second radial coronary intervention in 2017.   Over the past week the patient does have multiple episodes of chest tightness with left arm discomfort responsive to sublingual nitroglycerin. This is very similar to her most recent complaints in February when stenting was performed on the distal LAD. Episodes are nonexertional, rather occurring spontaneously. 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 She denies orthopnea and PND. She discontinued tobacco use.  Past Medical History:  Diagnosis Date  . CAD (coronary artery disease)    a. LHC on 06/21/15 which showed 75% occl mid-dist LCx, 40% occl mRCA, 99% mid-dist LAD s/p DES and 50% occl mLAD s/p DES (overlapping stents) and significant LV dysfunction with anteroapical HK; b. 06/2015 Myoview: EF 55-65%, no ischemia/infarct.  . CHF (congestive heart failure) ([place])   . GERD (gastroesophageal reflux disease)   . Hyperlipidemia   . Hypertension   . Hypothyroidism   . Myocardial stunning (HCC)    a. EF 45% on 2D ECHO on 06/21/15 and severe LV dysfunction on LV gram by cath. Repeat 2D ECHO with EF 55-60% and mild HK of the apicoseptal myocardium.  [name] Neuropraxia of right median nerve    a. suspected after right radial cath. will follow with neuro outpatient if does not resolve.   . NSTEMI (non-ST elevated myocardial  infarction) [facility]) 05/2015   [facility]  . Obesity   . 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 05/2015 Following PCI (radial access)-->conservatively managed.  . Tobacco abuse   . Tricuspid regurgitation   . Type II diabetes mellitus ([place])    a. Type II  . Vitamin D deficiency     Past Surgical History:  Procedure Laterality Date  . [place] STUDY N/A 07/14/2016   Procedure: [place] STUDY;  Surgeon: Mauri Pole, MD;  Location: [facility];  Service: Endoscopy;  Laterality: N/A;  . CARDIAC CATHETERIZATION N/A 06/21/2015   Procedure: Left Heart Cath and Coronary Angiography;  Surgeon: Belva Crome, MD; pLAD 99%, CFX 75%, RCA 40%, EF 35%   . CARDIAC CATHETERIZATION N/A 06/21/2015   Procedure: Coronary Stent Intervention;  Surgeon: Belva Crome, MD;  Promus Premier DES, 3.0 x 20 and 3.5 x 32 mm stents to the LAD  . CARDIAC CATHETERIZATION N/A 06/21/2015   Procedure: Intravascular Ultrasound/IVUS;  Surgeon: Belva Crome, MD;  Location: [place] [facility];  Service: Cardiovascular;  Laterality: N/A;  LAD  . CARDIAC CATHETERIZATION  06/22/2015   Procedure: Coronary/Graft Angiography;  Surgeon: Peter M Martinique, MD;  Location: [place] [facility];  Service: Cardiovascular;;  . CORONARY STENT INTERVENTION N/A 04/02/2016   Procedure: Coronary Stent Intervention;  Surgeon: Jettie Booze, MD;  Location: [place] [facility] LAB;  Service: Cardiovascular;  Laterality: N/A;  . ESOPHAGEAL  MANOMETRY N/A 07/14/2016   Procedure: ESOPHAGEAL MANOMETRY (EM);  Surgeon: Mauri Pole, MD;  Location: [facility];  Service: Endoscopy;  Laterality: N/A;  . LEFT HEART CATH AND CORONARY ANGIOGRAPHY N/A 04/02/2016   Procedure: Left Heart Cath and Coronary Angiography;  Surgeon: Jettie Booze, MD;  Location: [place] [facility] LAB;  Service: Cardiovascular;  Laterality: N/A;    Current Medications: Outpatient Medications Prior to Visit  Medication Sig Dispense Refill  . acetaminophen (TYLENOL) 500  MG tablet Take 500 mg by mouth every 6 (six) hours as needed for headache (pain).    [name] aspirin 81 MG EC tablet Take 1 tablet (81 mg total) by mouth daily. 90 tablet 3  . atorvastatin (LIPITOR) 40 MG tablet Take 1 tablet (40 mg total) by mouth daily at 6 PM. 90 tablet 3  . Cholecalciferol (VITAMIN D) 2000 units tablet Take 1 tablet (2,000 Units total) by mouth daily. 90 tablet 3  . ferrous gluconate (FERGON) 324 MG tablet Take 1 tablet (324 mg total) by mouth daily with breakfast. 90 tablet 1  . fluticasone furoate-vilanterol (BREO ELLIPTA) 200-25 MCG/INH AEPB Inhale 1 puff into the lungs daily. 28 each 0  . Insulin Glargine (LANTUS SOLOSTAR) 100 UNIT/ML Solostar Pen Inject 10 Units into the skin daily at 10 pm. 5 pen 5  . Insulin Pen Needle (PEN NEEDLES) 32G X 4 MM MISC 1 each by Does not apply route daily. Use with Lantus pen 100 each 5  . lisinopril-hydrochlorothiazide (PRINZIDE,ZESTORETIC) 10-12.5 MG tablet Take 1 tablet by mouth daily. 90 tablet 3  . metFORMIN (GLUCOPHAGE) 1000 MG tablet Take 1 tablet (1,000 mg total) by mouth 2 (two) times daily with a meal. 180 tablet 3  . metoprolol (LOPRESSOR) 50 MG tablet Take 1 tablet (50 mg total) by mouth 2 (two) times daily. 180 tablet 1  . nicotine (NICODERM CQ - DOSED IN MG/24 HOURS) 21 mg/24hr patch Place 1 patch (21 mg total) onto the skin daily. 28 patch 0  . nitroGLYCERIN (NITROSTAT) 0.4 MG SL tablet Place 1 tablet (0.4 mg total) under the tongue every 5 (five) minutes as needed for chest pain. 25 tablet 3  . pantoprazole (PROTONIX) 40 MG tablet Take 1 tablet (40 mg total) by mouth daily. 30 tablet 6  . Vitamin D, Ergocalciferol, (DRISDOL) 50000 units CAPS capsule Take 1 capsule (50,000 Units total) by mouth every 7 (seven) days. 12 capsule 3  . clopidogrel (PLAVIX) 75 MG tablet Take 1 tablet (75 mg total) by mouth daily. (Patient not taking: Reported on 07/16/2016) 90 tablet 3   No facility-administered medications prior to visit.       Allergies:   Patient has no known allergies.   Social History   Social History  . Marital status: Single    Spouse name: N/A  . Number of children: 4  . Years of education: 12   Occupational History  . CNA [place]   Social History Main Topics  . Smoking status: Former Smoker    Packs/day: 0.50    Years: 23.00    Types: Cigarettes    Quit date: 04/01/2016  . Smokeless tobacco: Never Used  . Alcohol use No  . Drug use: No  . Sexual activity: Not Asked   Other Topics Concern  . None   Social History Narrative   Lives in [place], works nights at [employer], [place]   Right-handed   Drinks 1 soda a day   Exercises some with walking   Lives  with her 9 yo son   Likes her space, doesn't want a significant other   03/2016     Family History:  The patient's family history includes Diabetes in her maternal aunt, maternal grandmother, and mother; Healthy in her brother, father, and sister; Heart disease in her maternal grandmother.   ROS:   Please see the history of present illness.    Very limited related to shortness of breath and recurring chest pressure.  All other systems reviewed and are negative.   PHYSICAL EXAM:   VS:  BP 116/78 (BP Location: Left Arm)   Pulse 81   Ht 5\' 9"  (1.753 m)   Wt 237 lb 12.8 oz (107.9 kg)   BMI 35.12 kg/m    GEN: Well nourished, well developed, in no acute distress. Moderate obesity.  HEENT: normal  Neck: no JVD, carotid bruits, or masses Cardiac: RRR; no murmurs, rubs, or gallops,no edema.  Respiratory:  clear to auscultation bilaterally, normal work of breathing GI: soft, nontender, nondistended, + BS MS: no deformity or atrophy . Absent right radial pulse. Weakness in the right hand. Skin: warm and dry, no rash Neuro:  Alert and Oriented x 3, Strength and sensation are intact Psych: euthymic mood, full affect  Wt Readings from Last 3 Encounters:  07/16/16 237 lb 12.8 oz (107.9 kg)  06/23/16 237 lb 12.8 oz (107.9 kg)  06/10/16  247 lb 2 oz (112.1 kg)      Studies/Labs Reviewed:   EKG:  EKG  Heart rate is 98 bpm, biatrial abnormality is noted, prominent voltage is noted.  Recent Labs: 01/25/2016: ALT 10 04/03/2016: Hemoglobin 11.2 04/10/2016: BUN 8; Creatinine, Ser 0.88; NT-Pro BNP 21; Platelets 263; Potassium 4.1; Sodium 137 04/15/2016: TSH 0.56   Lipid Panel    Component Value Date/Time   CHOL 140 01/25/2016 1015   TRIG 63 01/25/2016 1015   HDL 43 (L) 01/25/2016 1015   CHOLHDL 3.3 01/25/2016 1015   VLDL 13 01/25/2016 1015   [place] 84 01/25/2016 1015    Additional studies/ records that were reviewed today include:   Coronary angioplasty stent implantation 2017, February:  Coronary Diagrams   Diagnostic Diagram       Post-Intervention Diagram          ASSESSMENT:    1. Coronary artery disease involving native coronary artery of native heart with angina pectoris with documented spasm ([place])   2. Chronic diastolic heart failure (HCC)   3. Other hyperlipidemia   4. Tobacco abuse   5. Dyspnea, unspecified type   6. Neurapraxia of right median nerve, sequela      PLAN:  In order of problems listed above:  1. Recurrent angina and what seems an unstable pattern requiring multiple sublingual nitroglycerin over the past week for pain occurring at rest. The discomfort with radiation to the left shoulder is similar to prior episodes. I have recommended repeat coronary angiography which will be done as soon as possible. If recurrent angina uncontrolled by nitrates, report to the emergency room. 2. Does complain of dyspnea on exertion but on exam there is no evidence of volume overload. 3. Being treated with high intensity statin therapy, atorvastatin 40 mg per day. 4. Smoking is been discontinued completely. 5. Dyspnea may be an anginal equivalent. 6. Continues to complain of disabling pain and weakness in her right hand. This particular diagnosis was described to median nerve injury at the time  of radial artery catheterization which was performed twice within 24 hours in 2017. We  will arrange for him to have repeat neurological evaluation and perhaps physical therapy as directed by neurology consultant.  The patient was counseled to undergo left heart catheterization, coronary angiography, and possible percutaneous coronary intervention with stent implantation. The procedural risks and benefits were discussed in detail. The risks discussed included death, stroke, myocardial infarction, life-threatening bleeding, limb ischemia, kidney injury, allergy, and possible emergency cardiac surgery. The risk of these significant complications were estimated to occur less than 1% of the time. After discussion, the patient has agreed to proceed.  She is having difficulty working the cause of chest pain and dyspnea. She also feels hampered by the right hand problem.  Extended office visit, with greater than 50% of the time spent face-to-face with the patient outlining management strategies and counseling.  Medication Adjustments/Labs and Tests Ordered: Current medicines are reviewed at length with the patient today.  Concerns regarding medicines are outlined above.  Medication changes, Labs and Tests ordered today are listed in the Patient Instructions below. Patient Instructions  Medication Instructions:  None  Labwork: BMET, CBC and INR today  Testing/Procedures: Your physician has requested that you have a cardiac catheterization. Cardiac catheterization is used to diagnose and/or treat various heart conditions. Doctors may recommend this procedure for a number of different reasons. The most common reason is to evaluate chest pain. Chest pain can be a symptom of coronary artery disease (CAD), and cardiac catheterization can show whether plaque is narrowing or blocking your heart's arteries. This procedure is also used to evaluate the valves, as well as measure the blood flow and oxygen levels in  different parts of your heart. For further information please visit [email or web address]. Please follow instruction sheet, as given.    Follow-Up: Your physician recommends that you schedule a follow-up appointment in: 2 weeks with a PA or NP.   Any Other Special Instructions Will Be Listed Below (If Applicable).     If you need a refill on your cardiac medications before your next appointment, please call your pharmacy.      Signed, Sinclair Grooms, MD  07/16/2016 10:20 AM    [facility] [address], [place], [place]  [address] Phone: [phone]; Fax: [phone]

## 2016-07-20 ENCOUNTER — Telehealth: Payer: Self-pay | Admitting: Gastroenterology

## 2016-07-20 NOTE — Telephone Encounter (Signed)
Jennifer Cervantes can you please relay the following to this patient:  Esophageal manometry and 24 HR pH impedance testing done to evaluate her symptoms of dysphagia / reflux / chronic epigastric pain.  On protonix twice daily her Demeester score is 5, indicating good control of acid reflux. She did not have any nonacid reflux on this exam.   Further, esophageal manometry was normal, there is no evidence of a motility disorder or spasm to cause her symptoms.  She was recently evaluated by cardiology and had another cardiac cath - they are not sure if she is having spasm causing angina, cath seemed to show stable findings.  Her [place] did not show any gallstones, and her pH / manometry / barium swallow were unremarkable - she does not have pathologic or poorly controlled reflux causing her symptoms. If she continues to have reflux symptoms, perhaps she has functional heartburn, for which we would consider a TCA. She would need to see me back in the clinic to discuss this treatment option moving forward. Can you let her know? Thanks

## 2016-07-22 NOTE — Telephone Encounter (Signed)
Left message for patient to call back  

## 2016-07-22 NOTE — Telephone Encounter (Signed)
Patient advised of results. She would like to come in for a follow up office visit to discuss TCA treatment options. She was in another MD visit and said she would call back to schedule.

## 2016-07-22 NOTE — Telephone Encounter (Signed)
Pt states she is returning phone call to Almyra Free and best call back # is [phone].

## 2016-07-27 DIAGNOSIS — R1031 Right lower quadrant pain: Secondary | ICD-10-CM | POA: Diagnosis not present

## 2016-07-27 DIAGNOSIS — I252 Old myocardial infarction: Secondary | ICD-10-CM | POA: Diagnosis not present

## 2016-07-27 DIAGNOSIS — Z7902 Long term (current) use of antithrombotics/antiplatelets: Secondary | ICD-10-CM | POA: Diagnosis not present

## 2016-07-27 DIAGNOSIS — Z7982 Long term (current) use of aspirin: Secondary | ICD-10-CM | POA: Diagnosis not present

## 2016-07-27 DIAGNOSIS — Z79899 Other long term (current) drug therapy: Secondary | ICD-10-CM | POA: Diagnosis not present

## 2016-07-27 DIAGNOSIS — E119 Type 2 diabetes mellitus without complications: Secondary | ICD-10-CM | POA: Diagnosis not present

## 2016-07-27 DIAGNOSIS — Z794 Long term (current) use of insulin: Secondary | ICD-10-CM | POA: Diagnosis not present

## 2016-07-27 DIAGNOSIS — Z86718 Personal history of other venous thrombosis and embolism: Secondary | ICD-10-CM | POA: Diagnosis not present

## 2016-07-27 DIAGNOSIS — Z87891 Personal history of nicotine dependence: Secondary | ICD-10-CM | POA: Diagnosis not present

## 2016-07-28 ENCOUNTER — Telehealth: Payer: Self-pay | Admitting: Interventional Cardiology

## 2016-07-28 DIAGNOSIS — Z9889 Other specified postprocedural states: Secondary | ICD-10-CM | POA: Diagnosis not present

## 2016-07-28 DIAGNOSIS — M7989 Other specified soft tissue disorders: Secondary | ICD-10-CM | POA: Diagnosis not present

## 2016-07-28 DIAGNOSIS — R1031 Right lower quadrant pain: Secondary | ICD-10-CM | POA: Diagnosis not present

## 2016-07-28 NOTE — Telephone Encounter (Signed)
Pt states she went to [facility] ED last night because of lower extremity edema. Pt states she was told elevated D Dimer and elevated WBC 12.5 and she needed to call our office for further recommendations. Pt states she had ultrasound of legs and was told she did not have DVT.

## 2016-07-28 NOTE — Telephone Encounter (Signed)
Pt denies any other symptoms this morning, swelling in LE the same, pt denies shortness of breath or chest pain. Pt advised I will try to get records from [facility] in [place] and follow up with her once I have the records.

## 2016-07-28 NOTE — Telephone Encounter (Signed)
Dr Harrington Challenger reviewed [facility] ED records including right LE venous ultrasound done today (negative for DVT) and lab including D Dimer 0.73 WBC 12.5.  Pt denies any symptoms today, including chest pain or shortness of breath,  except  right LE pain and swelling.  Dr Harrington Challenger recommended scheduling pt for follow up office visit in the next day or two. I offered pt appt today, pt unable to come today, she has been scheduled to see Richardson Dopp, PA 07/29/16.

## 2016-07-28 NOTE — Telephone Encounter (Signed)
New message      Pt was seen in [facility] ER yesterday.  She states that her "levels" are up and they told her to call and talk to the nurse today.  Please call

## 2016-07-28 NOTE — Telephone Encounter (Signed)
[facility] ED records from 07/27/16 have been given to Chart Prep for appt with [facility] tomorrow

## 2016-07-28 NOTE — Telephone Encounter (Signed)
Pt states she went to ED because she was  having pain and swelling in her lower extremity.

## 2016-07-29 ENCOUNTER — Encounter: Payer: Self-pay | Admitting: Physician Assistant

## 2016-07-29 ENCOUNTER — Ambulatory Visit (INDEPENDENT_AMBULATORY_CARE_PROVIDER_SITE_OTHER): Payer: 59 | Admitting: Physician Assistant

## 2016-07-29 VITALS — BP 150/60 | HR 101 | Ht 69.0 in | Wt 233.1 lb

## 2016-07-29 DIAGNOSIS — I25119 Atherosclerotic heart disease of native coronary artery with unspecified angina pectoris: Secondary | ICD-10-CM

## 2016-07-29 DIAGNOSIS — I5032 Chronic diastolic (congestive) heart failure: Secondary | ICD-10-CM | POA: Diagnosis not present

## 2016-07-29 DIAGNOSIS — R55 Syncope and collapse: Secondary | ICD-10-CM

## 2016-07-29 DIAGNOSIS — K5900 Constipation, unspecified: Secondary | ICD-10-CM

## 2016-07-29 DIAGNOSIS — I10 Essential (primary) hypertension: Secondary | ICD-10-CM | POA: Diagnosis not present

## 2016-07-29 DIAGNOSIS — R112 Nausea with vomiting, unspecified: Secondary | ICD-10-CM | POA: Diagnosis not present

## 2016-07-29 DIAGNOSIS — R600 Localized edema: Secondary | ICD-10-CM | POA: Diagnosis not present

## 2016-07-29 DIAGNOSIS — E785 Hyperlipidemia, unspecified: Secondary | ICD-10-CM

## 2016-07-29 MED ORDER — ISOSORBIDE MONONITRATE ER 30 MG PO TB24: 30.0000 mg | ORAL_TABLET | Freq: Every day | ORAL | 3 refills | Status: DC

## 2016-07-29 MED FILL — ISOSORBIDE MN ER 30 MG TAB: 30 | 90 days supply | Qty: 90 | Fill #0

## 2016-07-29 NOTE — Patient Instructions (Signed)
Medication Instructions:  1. START IMDUR 30 MG DAILY; RX HAS BEEN SENT   Labwork: TODAY PRO BNP  Testing/Procedures: NONE ORDERED  Follow-Up: KEEP YOUR FOLLOW SCHEDULE FOR NEXT AS PLANNED WITH Cecilie Kicks, PA  Any Other Special Instructions Will Be Listed Below (If Applicable). MAKE SURE TO FOLLOW UP WITH YOUR GASTROENTEROLOGIST ABOUT NAUSEA AND VOMITING    If you need a refill on your cardiac medications before your next appointment, please call your pharmacy.

## 2016-07-29 NOTE — Progress Notes (Signed)
Cardiology Office Note:    Date:  07/29/2016   ID:  Jennifer Cervantes, DOB 07-20-1980, MRN [id number]  PCP:  Carlena Hurl, PA-C  Cardiologist:  Dr. Daneen Schick    Referring MD: Glade Lloyd Camelia Eng, PA-C   Chief Complaint  Patient presents with  . Hospitalization Follow-up    seen in ED for R leg swelling ([place])  . Coronary Artery Disease    s/p recent cath   . Nausea  . Loss of Consciousness    History of Present Illness:    Jennifer Cervantes is a 36 y.o. female with a hx of CAD s/p multiple PCI procedures, uncontrolled diabetes, obesity, tobacco abuse. She initially presented in 05/2015 with a non-STEMI. EF was 45% that time. She had high-grade mid to distal LAD stenosis which was treated with overlapping DES 2. She has had several heart catheterizations since. 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 She was admitted shortly after initial PCI in 5/17 with recurrent chest symptoms. Inpatient nuclear stress test was nonischemic. Cardiac catheterization in 03/2016 demonstrated patent LAD stents. However, she did have a distal LAD stenosis which was treated with a DES. Echocardiogram in 03/2016 demonstrated normal LV function with moderate diastolic dysfunction and trivial AI. She was recently seen by Dr. Tamala Julian 07/16/16. She complained of symptoms consistent with her previous angina that was nitroglycerin responsive. Cardiac catheterization was arranged. This demonstrated patent stents in the LAD. She had mild to moderate nonobstructive disease in the LCx and RCA. Medical therapy was continued. Of note, Dr. Tamala Julian also referred her to neurology due to continued symptoms in her right hand.    She was see in the ED at [facility] yesterday with R LE swelling and pain.  An ultrasound was negative for DVT.    Ms. Jennifer Cervantes returns for follow up after her trip to the ED. She is here with her mother  and her son.  The patient has multiple complaints.  She started having R leg swelling ~ 1 week ago.  She notes some R groin pain at her cath site.  She denies any mass or worsening pain.  I did get records from [place].  Her DDimer was minimally elevated (0.73).  Other labs: WBC 12.5 (high), Hgb 13.3, SCr 1.13, K 4.1.  As noted, her [place] was neg for DVT.  She has been having a lot of nausea and vomiting over the last few weeks.  She has vomited almost every day.  She does vomits sometimes after eating.  She denies diarrhea.  She notes constipation (she has not had a BM in 2 weeks).  She has seen GI in the past with a normal Ba swallow and an ultrasound with evidence of hepatic steatosis.  She continues to have episodes of chest pain that is responsive to NTG.  She also notes dyspnea on exertion.  She sleeps on 5 pillows and admits to symptoms that sound c/w PND.  She has not noted any LLE edema.  Her R leg feels like it is less swollen now.  She tells me she has passed out x 2.  Both occurred the day after her Cardiac Catheterization.  She was lying on a couch and got up to go to the BR.  She was walking back to the couch after urinating and passed out.  She denies any bleeding issues.  She denies fevers, dysuria, rash.    Prior CV  studies:   The following studies were reviewed today:  LHC 07/17/16 LAD proximal 30, mid stents patent, distal stent patent LCx mid 50, OM1 35 RCA mid 25 EF 55-65   Echo 03/2016 Mild LVH, EF 55-60, normal wall motion, grade 2 diastolic dysfunction, trivial AI  LHC 03/2016 PCI: 3 x 24 mm Promus premier DES to the distal LAD  Echo 06/2015 EF 55-60, apical septal hypokinesis, normal diastolic function, mild to moderate MR, mild LAE, mild to moderate TR, trivial pericardial effusion  Myoview 07/24/15 No ischemia, EF 57  LHC 05/2015 Mid LAD stents patent LCx mid 75 RCA mid 40  LHC 05/2015 LAD mid 6, 99 LCx mid 75 RCA mid 77 EF 35-45 PCI: 3 x 20 mm Promus premier DES  and 3.5 x 32 mm Promus premier DES (overlapping) to the mid LAD  Echo 05/2015 EF 45, grade 1 diastolic dysfunction, apical septal and apical hypokinesis, anteroseptal and apical lateral hypokinesis, mild MR, mild TR  Past Medical History:  Diagnosis Date  . CAD (coronary artery disease)    a. LHC on 06/21/15 which showed 75% occl mid-dist LCx, 40% occl mRCA, 99% mid-dist LAD s/p DES and 50% occl mLAD s/p DES (overlapping stents) and significant LV dysfunction with anteroapical HK; b. 06/2015 Myoview: EF 55-65%, no ischemia/infarct.  . CHF (congestive heart failure) ([place])   . GERD (gastroesophageal reflux disease)   . Hyperlipidemia   . Hypertension   . Hypothyroidism   . Myocardial stunning (HCC)    a. EF 45% on 2D ECHO on 06/21/15 and severe LV dysfunction on LV gram by cath. Repeat 2D ECHO with EF 55-60% and mild HK of the apicoseptal myocardium.  [name] Neuropraxia of right median nerve    a. suspected after right radial cath. will follow with neuro outpatient if does not resolve.   . NSTEMI (non-ST elevated myocardial infarction) [facility]) 05/2015   [facility]  . Obesity   . 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 05/2015 Following PCI (radial access)-->conservatively managed.  . Tobacco abuse   . Tricuspid regurgitation   . Type II diabetes mellitus ([place])    a. Type II  . Vitamin D deficiency     Past Surgical History:  Procedure Laterality Date  . [place] STUDY N/A 07/14/2016   Procedure: [place] STUDY;  Surgeon: Mauri Pole, MD;  Location: [facility] ENDOSCOPY;  Service: Endoscopy;  Laterality: N/A;  . CARDIAC CATHETERIZATION N/A 06/21/2015   Procedure: Left Heart Cath and Coronary Angiography;  Surgeon: Belva Crome, MD; pLAD 99%, CFX 75%, RCA 40%, EF 35%   . CARDIAC CATHETERIZATION N/A 06/21/2015   Procedure: Coronary Stent Intervention;  Surgeon: Belva Crome, MD;  Promus Premier DES, 3.0 x 20 and 3.5 x 32 mm stents to the LAD  . CARDIAC CATHETERIZATION N/A 06/21/2015    Procedure: Intravascular Ultrasound/IVUS;  Surgeon: Belva Crome, MD;  Location: [place] CV LAB;  Service: Cardiovascular;  Laterality: N/A;  LAD  . CARDIAC CATHETERIZATION  06/22/2015   Procedure: Coronary/Graft Angiography;  Surgeon: Peter M Martinique, MD;  Location: [place] CV LAB;  Service: Cardiovascular;;  . CORONARY STENT INTERVENTION N/A 04/02/2016   Procedure: Coronary Stent Intervention;  Surgeon: Jettie Booze, MD;  Location: [place] CV LAB;  Service: Cardiovascular;  Laterality: N/A;  . ESOPHAGEAL MANOMETRY N/A 07/14/2016   Procedure: ESOPHAGEAL MANOMETRY (EM);  Surgeon: Mauri Pole, MD;  Location: [facility] ENDOSCOPY;  Service: Endoscopy;  Laterality: N/A;  .  LEFT HEART CATH AND CORONARY ANGIOGRAPHY N/A 04/02/2016   Procedure: Left Heart Cath and Coronary Angiography;  Surgeon: Jettie Booze, MD;  Location: [place] CV LAB;  Service: Cardiovascular;  Laterality: N/A;  . LEFT HEART CATH AND CORONARY ANGIOGRAPHY N/A 07/17/2016   Procedure: Left Heart Cath and Coronary Angiography;  Surgeon: Leonie Man, MD;  Location: [place] CV LAB;  Service: Cardiovascular;  Laterality: N/A;    Current Medications: Current Meds  Medication Sig  . acetaminophen (TYLENOL) 500 MG tablet Take 500 mg by mouth every 6 (six) hours as needed for headache (pain).  [name] aspirin 81 MG EC tablet Take 1 tablet (81 mg total) by mouth daily.  [name] atorvastatin (LIPITOR) 40 MG tablet Take 1 tablet (40 mg total) by mouth daily at 6 PM.  . Cholecalciferol (VITAMIN D) 2000 units tablet Take 1 tablet (2,000 Units total) by mouth daily.  . clopidogrel (PLAVIX) 75 MG tablet Take 75 mg by mouth 2 (two) times daily.  . ferrous gluconate (FERGON) 324 MG tablet Take 1 tablet (324 mg total) by mouth daily with breakfast.  . fluticasone furoate-vilanterol (BREO ELLIPTA) 200-25 MCG/INH AEPB Inhale 1 puff into the lungs daily.  [name] gabapentin (NEURONTIN) 300 MG capsule Take 300 mg by mouth 2 (two) times  daily.  . Insulin Glargine (LANTUS SOLOSTAR) 100 UNIT/ML Solostar Pen Inject 10 Units into the skin daily at 10 pm.  . lisinopril-hydrochlorothiazide (PRINZIDE,ZESTORETIC) 10-12.5 MG tablet Take 1 tablet by mouth daily.  . metFORMIN (GLUCOPHAGE) 1000 MG tablet Take 1 tablet (1,000 mg total) by mouth 2 (two) times daily with a meal.  . metoprolol (LOPRESSOR) 50 MG tablet Take 1 tablet (50 mg total) by mouth 2 (two) times daily.  . nicotine (NICODERM CQ - DOSED IN MG/24 HOURS) 21 mg/24hr patch Place 1 patch (21 mg total) onto the skin daily.  . nitroGLYCERIN (NITROSTAT) 0.4 MG SL tablet Place 1 tablet (0.4 mg total) under the tongue every 5 (five) minutes as needed for chest pain.  . pantoprazole (PROTONIX) 40 MG tablet Take 40 mg by mouth 2 (two) times daily.     Allergies:   Patient has no known allergies.   Social History   Social History  . Marital status: Single    Spouse name: N/A  . Number of children: 4  . Years of education: 12   Occupational History  . CNA [employer]   Social History Main Topics  . Smoking status: Former Smoker    Packs/day: 0.50    Years: 23.00    Types: Cigarettes    Quit date: 04/01/2016  . Smokeless tobacco: Never Used  . Alcohol use No  . Drug use: No  . Sexual activity: Not Asked   Other Topics Concern  . None   Social History Narrative   Lives in [place], works nights at [employer], [place]   Right-handed   [place] 1 soda a day   Exercises some with walking   Lives with her 19 yo son   Likes her space, doesn't want a significant other   03/2016     Family Hx: The patient's family history includes Diabetes in her maternal aunt, maternal grandmother, and mother; Healthy in her brother, father, and sister; Heart disease in her maternal grandmother. There is no history of Heart attack, Heart failure, Stroke, Hyperlipidemia, or Hypertension.  ROS:   Please see the history of present illness.    Review of Systems  Cardiovascular: Positive for  dyspnea on  exertion and leg swelling.  Respiratory: Positive for shortness of breath.   Hematologic/Lymphatic: Bruises/bleeds easily.  Musculoskeletal: Positive for joint pain.  Gastrointestinal: Positive for nausea.   All other systems reviewed and are negative.   EKGs/Labs/Other Test Reviewed:    EKG:  EKG is  ordered today.  The ekg ordered today demonstrates sinus tachy, HR 101, normal axis, QTc 443 ms, no changes  Recent Labs: 01/25/2016: ALT 10 04/03/2016: Hemoglobin 11.2 04/10/2016: NT-Pro BNP 21 04/15/2016: TSH 0.56 07/16/2016: BUN 10; Creatinine, Ser 1.10; Hemoglobin 12.7; Platelets 265; Potassium 4.8; Sodium 134   Recent Lipid Panel Lab Results  Component Value Date/Time   CHOL 140 01/25/2016 10:15 AM   TRIG 63 01/25/2016 10:15 AM   HDL 43 (L) 01/25/2016 10:15 AM   CHOLHDL 3.3 01/25/2016 10:15 AM   LDLCALC 84 01/25/2016 10:15 AM    Physical Exam:    VS:  BP (!) 150/60   Pulse (!) 101   Ht 5\' 9"  (1.753 m)   Wt 233 lb 1.9 oz (105.7 kg)   BMI 34.43 kg/m     Wt Readings from Last 3 Encounters:  07/29/16 233 lb 1.9 oz (105.7 kg)  07/17/16 236 lb (107 kg)  07/16/16 237 lb 12.8 oz (107.9 kg)     Physical Exam  Constitutional: She is oriented to person, place, and time. She appears well-developed and well-nourished. No distress.  HENT:  Head: Normocephalic and atraumatic.  Eyes: No scleral icterus.  Neck: Normal range of motion. No JVD present.  Cardiovascular: Normal rate, regular rhythm, S1 normal, S2 normal and normal heart sounds.   No murmur heard. Pulmonary/Chest: Breath sounds normal. She has no wheezes. She has no rhonchi. She has no rales.  Abdominal: Soft. There is no hepatomegaly. There is tenderness in the right upper quadrant.  Musculoskeletal: She exhibits no edema or deformity (R FA site without hematoma or bruit).  Neurological: She is alert and oriented to person, place, and time.  Skin: Skin is warm and dry.  Psychiatric: She has a normal mood  and affect.    ASSESSMENT:    1. Coronary artery disease involving native coronary artery of native heart with angina pectoris ([place])   2. Chronic diastolic heart failure ([place])   3. Syncope, unspecified syncope type   4. Essential hypertension   5. Hyperlipidemia, unspecified hyperlipidemia type   6. Nausea and vomiting, intractability of vomiting not specified, unspecified vomiting type   7. Constipation, unspecified constipation type   8. Leg edema    PLAN:    In order of problems listed above:  1. Coronary artery disease involving native coronary artery of native heart with angina pectoris [facility]) -  She is s/p DES x 2 to the LAD in 05/2015 and DES to distal LAD in 03/2016.  Recent LHC with patent stents and no significant change in her anatomy since her prior procedure.  Med Rx was recommended.  She continues to have significant episodes of chest pain relieved by NTG.  -  Continue ASA, Clopidogrel, beta-blocker, statin  -  Start Isosorbide MN 30 mg QD  -  Keep follow up next to assess response.   2. Chronic diastolic heart failure ([place]) - She has chronic shortness of breath and she describes orthopnea as well as PND.  Exam does not suggest significant volume excess.  We discussed starting on Lasix but she tells me that Lasix did not work for her.    -  Plan: Pro b natriuretic peptide  -  If BNP elevated, consider increasing HCTZ dose.   3. Syncope, unspecified syncope type - Etiology not entirely clear but syndrome sounds like orthostasis.  She had 2 episodes of syncope occur after standing the day after her Cardiac Catheterization.  Her recent hemoglobin was normal.  Her ejection fraction was normal on echocardiogram in 2/18 and her ejection fraction was normal on LV gram at her recent Cardiac Catheterization.  Her ECG does not demonstrate any significant changes.  At this point, I do not think she needs a monitor. If she has recurrent symptoms, we will need to consider an event  monitor.   4. Essential hypertension - BP uncontrolled.  Add Isosorbide as noted above.  Continue Metoprolol and Lisinopril/HCTZ.    5. Hyperlipidemia, unspecified hyperlipidemia type - Continue statin.   6. Nausea and vomiting, intractability of vomiting not specified, unspecified vomiting type - She has a lot of nausea and vomiting.  She also has constipation without a bowel movement in 2 weeks. She has seen GI for GERD and constipation in the past.  I have asked her to follow up with GI to further evaluate.    7. Constipation, unspecified constipation type - FU with GI as noted above.   8. Leg edema - Recent US neg for DVT.  Her exam today does not demonstrate edema.  No further testing needed.  Total time spent with patient today 45 minutes. This includes reviewing records, evaluating the patient and coordinating care. Face-to-face time >50%.   Dispo:  Return in about 6 days (around 08/04/2016) for Scheduled Follow Up with Cecilie Kicks, NP.   Medication Adjustments/Labs and Tests Ordered: Current medicines are reviewed at length with the patient today.  Concerns regarding medicines are outlined above.  Orders/Tests:  Orders Placed This Encounter  Procedures  . Pro b natriuretic peptide  . EKG 12-Lead   Medication changes: Meds ordered this encounter  Medications  . isosorbide mononitrate (IMDUR) 30 MG 24 hr tablet    Sig: Take 1 tablet (30 mg total) by mouth daily.    Dispense:  90 tablet    Refill:  3   Signed, Richardson Dopp, PA-C  07/29/2016 4:40 PM    [facility] [address], [place], [place]  [address] Phone: [phone]; Fax: [phone]

## 2016-07-30 ENCOUNTER — Telehealth: Payer: Self-pay | Admitting: *Deleted

## 2016-07-30 LAB — PRO B NATRIURETIC PEPTIDE: NT-PRO BNP: 19 pg/mL (ref 0–130)

## 2016-07-30 NOTE — Telephone Encounter (Signed)
Pt has been notified of normal bnp results. Pt verbalized understanding to results given today. Pt thanked me for my call.

## 2016-07-30 NOTE — Telephone Encounter (Signed)
-----   Message from Liliane Shi, [place] sent at 07/30/2016  1:23 PM EDT ----- Please call the patient BNP is normal. Continue with current treatment plan. Richardson Dopp, PA-C   07/30/2016 1:16 PM

## 2016-08-03 NOTE — Progress Notes (Signed)
Cardiology Office Note   Date:  08/04/2016   ID:  Jennifer Cervantes, DOB 1980-10-07, MRN [id number]  PCP:  Carlena Hurl, PA-C  Cardiologist:  Dr. Tamala Julian    No chief complaint on file.     History of Present Illness: Jennifer Cervantes is a 36 y.o. female who presents for chest pain, SOB and syncope   Pt with a hx in 05/2015 with a non-STEMI. EF was 45% that time. She had high-grade mid to distal LAD stenosis which was treated with overlapping DES 2. She has had several heart catheterizations since. 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 She was admitted shortly after initial PCI in 5/17 with recurrent chest symptoms. Inpatient nuclear stress test was nonischemic. Cardiac catheterization in 03/2016 demonstrated patent LAD stents. However, she did have a distal LAD stenosis which was treated with a DES. Echocardiogram in 03/2016 demonstrated normal LV function with moderate diastolic dysfunction and trivial AI. She was recently seen by Dr. Tamala Julian 07/16/16. She complained of symptoms consistent with her previous angina that was nitroglycerin responsive. Cardiac catheterization was arranged. This demonstrated patent stents in the LAD. She had mild to moderate nonobstructive disease in the LCx and RCA. Medical therapy was continued. Of note, Dr. Tamala Julian also referred her to neurology due to continued symptoms in her right hand.    She was see in the ED at [facility] prior to last visit with R LE swelling and pain.  An ultrasound was negative for DVT.    Seen last week for post hospital follow up from [facility] She continues to have episodes of chest pain that is responsive to NTG.  She also notes dyspnea on exertion.  She sleeps on 5 pillows and admits to symptoms that sound c/w PND.  She has not noted any LLE edema.  Her R leg feels like it is less swollen now.  She tells me she has passed out x 2.   Both occurred the day after her Cardiac Catheterization.  She was lying on a couch and got up to go to the BR.  She was walking back to the couch after urinating and passed out.  She denies any bleeding issues.  She denies fevers, dysuria, rash.    Continued angina imdur was started, and ASA , plavix and BB along with statin continues.   For her orthopnea her pro BNP was normal.  Syncope, with standing, BP uncontrolled on last visit.   Also with N&V and no BM in 2 weeks though constipation is a chronic problem.   Has had PFTs and inhaler started but she does not feel it has helped.  May need pulmonary consult.  Today + SOB with activity and with lying down.  Still sleeps on 5 pillows.  No further syncope.  + rt groin pain, tender to touch and has difficulty walking.  She would like to go back to work., but still with pain on walking.  Chest pain continues but not as severe.      Past Medical History:  Diagnosis Date  . CAD (coronary artery disease)    a. LHC on 06/21/15 which showed 75% occl mid-dist LCx, 40% occl mRCA, 99% mid-dist LAD s/p DES and 50% occl mLAD s/p DES (overlapping stents) and significant LV dysfunction with anteroapical HK; b. 06/2015 Myoview: EF 55-65%, no ischemia/infarct.  . CHF (congestive heart failure) ([place])   . GERD (gastroesophageal reflux  disease)   . Hyperlipidemia   . Hypertension   . Hypothyroidism   . Myocardial stunning (HCC)    a. EF 45% on 2D ECHO on 06/21/15 and severe LV dysfunction on LV gram by cath. Repeat 2D ECHO with EF 55-60% and mild HK of the apicoseptal myocardium.  [name] Neuropraxia of right median nerve    a. suspected after right radial cath. will follow with neuro outpatient if does not resolve.   . NSTEMI (non-ST elevated myocardial infarction) [facility]) 05/2015   [facility]  . Obesity   . 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 05/2015 Following PCI (radial access)-->conservatively managed.  . Tobacco abuse   . Tricuspid regurgitation   . Type II  diabetes mellitus ([place])    a. Type II  . Vitamin D deficiency     Past Surgical History:  Procedure Laterality Date  . [place] STUDY N/A 07/14/2016   Procedure: [place] STUDY;  Surgeon: Mauri Pole, MD;  [facility];  Service: Endoscopy;  Laterality: N/A;  . CARDIAC CATHETERIZATION N/A 06/21/2015   Procedure: Left Heart Cath and Coronary Angiography;  Surgeon: Belva Crome, MD; pLAD 99%, CFX 75%, RCA 40%, EF 35%   . CARDIAC CATHETERIZATION N/A 06/21/2015   Procedure: Coronary Stent Intervention;  Surgeon: Belva Crome, MD;  Promus Premier DES, 3.0 x 20 and 3.5 x 32 mm stents to the LAD  . CARDIAC CATHETERIZATION N/A 06/21/2015   Procedure: Intravascular Ultrasound/IVUS;  Surgeon: Belva Crome, MD;  Location: [place] CV LAB;  Service: Cardiovascular;  Laterality: N/A;  LAD  . CARDIAC CATHETERIZATION  06/22/2015   Procedure: Coronary/Graft Angiography;  Surgeon: Peter M Martinique, MD;  Location: [place] CV LAB;  Service: Cardiovascular;;  . CORONARY STENT INTERVENTION N/A 04/02/2016   Procedure: Coronary Stent Intervention;  Surgeon: Jettie Booze, MD;  Location: [place] CV LAB;  Service: Cardiovascular;  Laterality: N/A;  . ESOPHAGEAL MANOMETRY N/A 07/14/2016   Procedure: ESOPHAGEAL MANOMETRY (EM);  Surgeon: Mauri Pole, MD;  [facility];  Service: Endoscopy;  Laterality: N/A;  . LEFT HEART CATH AND CORONARY ANGIOGRAPHY N/A 04/02/2016   Procedure: Left Heart Cath and Coronary Angiography;  Surgeon: Jettie Booze, MD;  Location: [place] CV LAB;  Service: Cardiovascular;  Laterality: N/A;  . LEFT HEART CATH AND CORONARY ANGIOGRAPHY N/A 07/17/2016   Procedure: Left Heart Cath and Coronary Angiography;  Surgeon: Leonie Man, MD;  Location: [place] CV LAB;  Service: Cardiovascular;  Laterality: N/A;     Current Outpatient Prescriptions  Medication Sig Dispense Refill  . acetaminophen (TYLENOL) 500 MG tablet Take 500 mg by  mouth every 6 (six) hours as needed for headache (pain).    [name] aspirin 81 MG EC tablet Take 1 tablet (81 mg total) by mouth daily. 90 tablet 3  . atorvastatin (LIPITOR) 40 MG tablet Take 1 tablet (40 mg total) by mouth daily at 6 PM. 90 tablet 3  . Cholecalciferol (VITAMIN D) 2000 units tablet Take 1 tablet (2,000 Units total) by mouth daily. 90 tablet 3  . clopidogrel (PLAVIX) 75 MG tablet Take 75 mg by mouth 2 (two) times daily.    . ferrous gluconate (FERGON) 324 MG tablet Take 1 tablet (324 mg total) by mouth daily with breakfast. 90 tablet 1  . fluticasone furoate-vilanterol (BREO ELLIPTA) 200-25 MCG/INH AEPB Inhale 1 puff into the lungs daily. 28 each 0  . gabapentin (NEURONTIN) 300 MG capsule  Take 300 mg by mouth 2 (two) times daily.    . Insulin Glargine (LANTUS SOLOSTAR) 100 UNIT/ML Solostar Pen Inject 10 Units into the skin daily at 10 pm. 5 pen 5  . isosorbide mononitrate (IMDUR) 30 MG 24 hr tablet Take 1 tablet (30 mg total) by mouth daily. 90 tablet 3  . lisinopril-hydrochlorothiazide (PRINZIDE,ZESTORETIC) 10-12.5 MG tablet Take 1 tablet by mouth daily. 90 tablet 3  . metFORMIN (GLUCOPHAGE) 1000 MG tablet Take 1 tablet (1,000 mg total) by mouth 2 (two) times daily with a meal. 180 tablet 3  . metoprolol (LOPRESSOR) 50 MG tablet Take 1 tablet (50 mg total) by mouth 2 (two) times daily. 180 tablet 1  . nicotine (NICODERM CQ - DOSED IN MG/24 HOURS) 21 mg/24hr patch Place 1 patch (21 mg total) onto the skin daily. 28 patch 0  . nitroGLYCERIN (NITROSTAT) 0.4 MG SL tablet Place 1 tablet (0.4 mg total) under the tongue every 5 (five) minutes as needed for chest pain. 25 tablet 3  . ondansetron (ZOFRAN) 4 MG tablet Take 4 mg by mouth every 6 (six) hours as needed for nausea. for nausea  0  . pantoprazole (PROTONIX) 40 MG tablet Take 40 mg by mouth 2 (two) times daily.     No current facility-administered medications for this visit.     Allergies:   Patient has no known allergies.     Social History:  The patient  reports that she quit smoking about 4 months ago. Her smoking use included Cigarettes. She has a 11.50 pack-year smoking history. She has never used smokeless tobacco. She reports that she does not drink alcohol or use drugs.   Family History:  The patient's family history includes Diabetes in her maternal aunt, maternal grandmother, and mother; Healthy in her brother, father, and sister; Heart disease in her maternal grandmother.    ROS:  General:no colds or fevers, no weight changes Skin:no rashes or ulcers HEENT:no blurred vision, no congestion CV:see HPI PUL:see HPI GI:no diarrhea constipation or melena, no indigestion GU:no hematuria, no dysuria MS:no joint pain, no claudication Neuro:no syncope, no lightheadedness Endo:no diabetes, no thyroid disease   Wt Readings from Last 3 Encounters:  08/04/16 239 lb 6.4 oz (108.6 kg)  07/29/16 233 lb 1.9 oz (105.7 kg)  07/17/16 236 lb (107 kg)     PHYSICAL EXAM: VS:  BP 106/70   Pulse 89   Ht 5\' 9"  (1.753 m)   Wt 239 lb 6.4 oz (108.6 kg)   SpO2 99%   BMI 35.35 kg/m  , BMI Body mass index is 35.35 kg/m. General:Pleasant affect, NAD Skin:Warm and dry, brisk capillary refill HEENT:normocephalic, sclera clear, mucus membranes moist Neck:supple, no JVD, no bruits  Heart:S1S2 RRR without murmur, gallup, rub or click Lungs:clear without rales, rhonchi, or wheezes PTW:SFKC, non tender, + BS, do not palpate liver spleen or masses Ext:no lower ext edema, 1-2+ pedal pulses, 2+ radial pulses, rt groin with mild edema and very tender to touch.   Neuro:alert and oriented X 3, MAE, follows commands, + facial symmetry   Lying BP 124/87 P 91  Sitting BP 123/88 P 93 Standing BP 116/ 83  P 117 Standing for 3 mins 119/89 P 125   EKG:  EKG is NOT ordered today.    Recent Labs: 01/25/2016: ALT 10 04/15/2016: TSH 0.56 07/16/2016: BUN 10; Creatinine, Ser 1.10; Hemoglobin 12.7; Platelets 265; Potassium 4.8;  Sodium 134 07/29/2016: NT-Pro BNP 19    Lipid Panel    Component  Value Date/Time   CHOL 140 01/25/2016 1015   TRIG 63 01/25/2016 1015   HDL 43 (L) 01/25/2016 1015   CHOLHDL 3.3 01/25/2016 1015   VLDL 13 01/25/2016 1015   LDLCALC 84 01/25/2016 1015       Other studies Reviewed: Additional studies/ records that were reviewed today include: . Cardiac cath 07/17/16  Procedures   Left Heart Cath and Coronary Angiography  Conclusion     Mid Cx to Dist Cx lesion, 50 %stenosed.  Prox LAD lesion, 30 %stenosed - proximal to long stented segment..  Prox LAD to Mid LAD to Dist LAD overlapping DES stents (Feb 2018), 15 %stenosed. (3 DES stents) - minimal ISR  The left ventricular systolic function is normal.  LV end diastolic pressure is normal.  The left ventricular ejection fraction is 55-65% by visual estimate.   Angiographically minimal change from final Post-PCI angiographic findings from Feb 2018 (DES PCI to dLAD). No new lesion to explain her CP symptoms.  Consider spasm vs. Non-anginal Chest Pain.    Holter monitor was normal  04/15/16   ECHO 04/10/16  Study Conclusions  - Left ventricle: The cavity size was normal. Wall thickness was   increased in a pattern of mild LVH. Systolic function was normal.   The estimated ejection fraction was in the range of 55% to 60%.   Wall motion was normal; there were no regional wall motion   abnormalities. Features are consistent with a pseudonormal left   ventricular filling pattern, with concomitant abnormal relaxation   and increased filling pressure (grade 2 diastolic dysfunction). - Aortic valve: There was trivial regurgitation.  ASSESSMENT AND PLAN:  1.  Chest pain with recent cardiac cath and stable coronary arteries.  Imdur may be helping.  Will have her follow up in 2 weeks on day Dr. Tamala Julian is in the office.  2.  CAD with stable coronary arteries with hx of stents.    3. DOE on inhaler. BNP is normal.  May need  pulmonary consult  4. Syncope with mildly elevated ddimer and continued DOE will do VQ of chest rule out PE  - no further syncope and still with some orthostatic hypotension today.     5.  Rt groin pain, will check femoral artery doppler, recent cath may be pseudo. If no problems then will allow her to return to work.    6. HLD on statin   7.  Constipation per GI chronic problem.       Current medicines are reviewed with the patient today.  The patient Has no concerns regarding medicines.  The following changes have been made:  See above Labs/ tests ordered today include:see above  Disposition:   FU:  see above  Signed, Cecilie Kicks, NP  08/04/2016 10:12 AM    [address], [place], [place], [place] Phone: [phone]; Fax: [phone]

## 2016-08-04 ENCOUNTER — Ambulatory Visit (INDEPENDENT_AMBULATORY_CARE_PROVIDER_SITE_OTHER): Payer: 59 | Admitting: Medical

## 2016-08-04 ENCOUNTER — Encounter: Payer: Self-pay | Admitting: Cardiology

## 2016-08-04 ENCOUNTER — Telehealth: Payer: Self-pay | Admitting: Medical

## 2016-08-04 ENCOUNTER — Ambulatory Visit (INDEPENDENT_AMBULATORY_CARE_PROVIDER_SITE_OTHER): Payer: 59 | Admitting: Cardiology

## 2016-08-04 ENCOUNTER — Encounter: Payer: Self-pay | Admitting: Medical

## 2016-08-04 VITALS — BP 106/70 | HR 89 | Ht 69.0 in | Wt 239.4 lb

## 2016-08-04 VITALS — BP 128/72 | HR 75 | Temp 98.7°F | Wt 239.0 lb

## 2016-08-04 DIAGNOSIS — R1030 Lower abdominal pain, unspecified: Secondary | ICD-10-CM

## 2016-08-04 DIAGNOSIS — R0602 Shortness of breath: Secondary | ICD-10-CM | POA: Diagnosis not present

## 2016-08-04 DIAGNOSIS — R112 Nausea with vomiting, unspecified: Secondary | ICD-10-CM

## 2016-08-04 DIAGNOSIS — M79604 Pain in right leg: Secondary | ICD-10-CM

## 2016-08-04 DIAGNOSIS — D72829 Elevated white blood cell count, unspecified: Secondary | ICD-10-CM | POA: Diagnosis not present

## 2016-08-04 DIAGNOSIS — I5032 Chronic diastolic (congestive) heart failure: Secondary | ICD-10-CM | POA: Diagnosis not present

## 2016-08-04 DIAGNOSIS — E7849 Other hyperlipidemia: Secondary | ICD-10-CM

## 2016-08-04 DIAGNOSIS — E784 Other hyperlipidemia: Secondary | ICD-10-CM

## 2016-08-04 DIAGNOSIS — I10 Essential (primary) hypertension: Secondary | ICD-10-CM

## 2016-08-04 DIAGNOSIS — R55 Syncope and collapse: Secondary | ICD-10-CM | POA: Diagnosis not present

## 2016-08-04 DIAGNOSIS — K5900 Constipation, unspecified: Secondary | ICD-10-CM

## 2016-08-04 DIAGNOSIS — R942 Abnormal results of pulmonary function studies: Secondary | ICD-10-CM | POA: Diagnosis not present

## 2016-08-04 DIAGNOSIS — R7989 Other specified abnormal findings of blood chemistry: Secondary | ICD-10-CM

## 2016-08-04 DIAGNOSIS — R110 Nausea: Secondary | ICD-10-CM

## 2016-08-04 DIAGNOSIS — I25119 Atherosclerotic heart disease of native coronary artery with unspecified angina pectoris: Secondary | ICD-10-CM | POA: Diagnosis not present

## 2016-08-04 DIAGNOSIS — R1031 Right lower quadrant pain: Secondary | ICD-10-CM

## 2016-08-04 LAB — POCT URINALYSIS DIP (PROADVANTAGE DEVICE)
BILIRUBIN UA: NEGATIVE
Ketones, POC UA: NEGATIVE mg/dL
Nitrite, UA: NEGATIVE
RBC UA: NEGATIVE
SPECIFIC GRAVITY, URINE: 1.03
UUROB: POSITIVE
pH, UA: 6 (ref 5.0–8.0)

## 2016-08-04 NOTE — Patient Instructions (Signed)
Medication Instructions:  None  Labwork: None  Testing/Procedures: Your provider would like for you to have a doppler of your groin performed as soon as possible.  Your provider would like for you to have a VQ Scan performed as soon as possible.   Follow-Up: Your physician recommends that you schedule a follow-up appointment in: 3-4 weeks with Dr. Tamala Julian.    Any Other Special Instructions Will Be Listed Below (If Applicable).     If you need a refill on your cardiac medications before your next appointment, please call your pharmacy.

## 2016-08-04 NOTE — Progress Notes (Signed)
Subjective: Chief Complaint  Patient presents with  . Nausea    vomitting , leg pain  started last week    Here for c/o ongoing nausea, vomiting and leg pain.  She just recently had cardiac catheterization 07/17/16.  Still c/o right upper thigh pain and swelling.  Went to [facility] ED a week ago, had negative doppler US.  She saw cardiology this morning and has another thigh Korea tomorrow as well as VQ scan tomorrow.    She also went to the ED for ongoing nausea, vomiting for weeks.  Had manometry test through GI recently , reportedly normal.   She denies eating spicy, fried or acidic GERD causing foods.   She notes no diarrhea.  Does have problem with constipation but takes medication for this regularly.  Also taking Prilosec for GERD.  Denies sick contacts.  No urinary c/o, no fever.  No weight loss.   Wants repeat CBC given recent WBC elevation at the ED.  She wants repeat PFT per request of cardiology.  She is taking BREO daily with improvement.  Past Medical History:  Diagnosis Date  . CAD (coronary artery disease)    a. LHC on 06/21/15 which showed 75% occl mid-dist LCx, 40% occl mRCA, 99% mid-dist LAD s/p DES and 50% occl mLAD s/p DES (overlapping stents) and significant LV dysfunction with anteroapical HK; b. 06/2015 Myoview: EF 55-65%, no ischemia/infarct.  . CHF (congestive heart failure) ([place])   . GERD (gastroesophageal reflux disease)   . Hyperlipidemia   . Hypertension   . Hypothyroidism   . Myocardial stunning (HCC)    a. EF 45% on 2D ECHO on 06/21/15 and severe LV dysfunction on LV gram by cath. Repeat 2D ECHO with EF 55-60% and mild HK of the apicoseptal myocardium.  [name] Neuropraxia of right median nerve    a. suspected after right radial cath. will follow with neuro outpatient if does not resolve.   . NSTEMI (non-ST elevated myocardial infarction) [facility]) 05/2015   [facility]  . Obesity   . 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 05/2015 Following PCI (radial  access)-->conservatively managed.  . Tobacco abuse   . Tricuspid regurgitation   . Type II diabetes mellitus ([place])    a. Type II  . Vitamin D deficiency    Current Outpatient Prescriptions on File Prior to Visit  Medication Sig Dispense Refill  . acetaminophen (TYLENOL) 500 MG tablet Take 500 mg by mouth every 6 (six) hours as needed for headache (pain).    [name] aspirin 81 MG EC tablet Take 1 tablet (81 mg total) by mouth daily. 90 tablet 3  . atorvastatin (LIPITOR) 40 MG tablet Take 1 tablet (40 mg total) by mouth daily at 6 PM. 90 tablet 3  . Cholecalciferol (VITAMIN D) 2000 units tablet Take 1 tablet (2,000 Units total) by mouth daily. 90 tablet 3  . clopidogrel (PLAVIX) 75 MG tablet Take 75 mg by mouth 2 (two) times daily.    . ferrous gluconate (FERGON) 324 MG tablet Take 1 tablet (324 mg total) by mouth daily with breakfast. 90 tablet 1  . fluticasone furoate-vilanterol (BREO ELLIPTA) 200-25 MCG/INH AEPB Inhale 1 puff into the lungs daily. 28 each 0  . gabapentin (NEURONTIN) 300 MG capsule Take 300 mg by mouth 2 (two) times daily.    . Insulin Glargine (LANTUS SOLOSTAR) 100 UNIT/ML Solostar Pen Inject 10 Units into the skin daily at 10 pm. 5 pen 5  . isosorbide mononitrate (IMDUR)  30 MG 24 hr tablet Take 1 tablet (30 mg total) by mouth daily. 90 tablet 3  . lisinopril-hydrochlorothiazide (PRINZIDE,ZESTORETIC) 10-12.5 MG tablet Take 1 tablet by mouth daily. 90 tablet 3  . metFORMIN (GLUCOPHAGE) 1000 MG tablet Take 1 tablet (1,000 mg total) by mouth 2 (two) times daily with a meal. 180 tablet 3  . metoprolol (LOPRESSOR) 50 MG tablet Take 1 tablet (50 mg total) by mouth 2 (two) times daily. 180 tablet 1  . nicotine (NICODERM CQ - DOSED IN MG/24 HOURS) 21 mg/24hr patch Place 1 patch (21 mg total) onto the skin daily. 28 patch 0  . nitroGLYCERIN (NITROSTAT) 0.4 MG SL tablet Place 1 tablet (0.4 mg total) under the tongue every 5 (five) minutes as needed for chest pain. 25 tablet 3  .  pantoprazole (PROTONIX) 40 MG tablet Take 40 mg by mouth 2 (two) times daily.    . ondansetron (ZOFRAN) 4 MG tablet Take 4 mg by mouth every 6 (six) hours as needed for nausea. for nausea  0   No current facility-administered medications on file prior to visit.    Past Surgical History:  Procedure Laterality Date  . [place] STUDY N/A 07/14/2016   Procedure: [place] STUDY;  Surgeon: Mauri Pole, MD;  [facility];  Service: Endoscopy;  Laterality: N/A;  . CARDIAC CATHETERIZATION N/A 06/21/2015   Procedure: Left Heart Cath and Coronary Angiography;  Surgeon: Belva Crome, MD; pLAD 99%, CFX 75%, RCA 40%, EF 35%   . CARDIAC CATHETERIZATION N/A 06/21/2015   Procedure: Coronary Stent Intervention;  Surgeon: Belva Crome, MD;  Promus Premier DES, 3.0 x 20 and 3.5 x 32 mm stents to the LAD  . CARDIAC CATHETERIZATION N/A 06/21/2015   Procedure: Intravascular Ultrasound/IVUS;  Surgeon: Belva Crome, MD;  Location: [place] CV LAB;  Service: Cardiovascular;  Laterality: N/A;  LAD  . CARDIAC CATHETERIZATION  06/22/2015   Procedure: Coronary/Graft Angiography;  Surgeon: Peter M Martinique, MD;  Location: [place] CV LAB;  Service: Cardiovascular;;  . CORONARY STENT INTERVENTION N/A 04/02/2016   Procedure: Coronary Stent Intervention;  Surgeon: Jettie Booze, MD;  Location: [place] CV LAB;  Service: Cardiovascular;  Laterality: N/A;  . ESOPHAGEAL MANOMETRY N/A 07/14/2016   Procedure: ESOPHAGEAL MANOMETRY (EM);  Surgeon: Mauri Pole, MD;  [facility];  Service: Endoscopy;  Laterality: N/A;  . LEFT HEART CATH AND CORONARY ANGIOGRAPHY N/A 04/02/2016   Procedure: Left Heart Cath and Coronary Angiography;  Surgeon: Jettie Booze, MD;  Location: [place] CV LAB;  Service: Cardiovascular;  Laterality: N/A;  . LEFT HEART CATH AND CORONARY ANGIOGRAPHY N/A 07/17/2016   Procedure: Left Heart Cath and Coronary Angiography;  Surgeon: Leonie Man, MD;   Location: [place] CV LAB;  Service: Cardiovascular;  Laterality: N/A;    ROS as in subjective  Objective: BP 128/72   Pulse 75   Temp 98.7 F (37.1 C)   Wt 239 lb (108.4 kg)   SpO2 98%   BMI 35.29 kg/m   General appearance: alert, no distress, WD/WN,  HEENT: normocephalic, sclerae anicteric, TMs pearly, nares patent, no discharge or erythema, pharynx normal Oral cavity: MMM, no lesions Neck: supple, no lymphadenopathy, no thyromegaly, no masses Heart: RRR, normal S1, S2, no murmurs Lungs: CTA bilaterally, no wheezes, rhonchi, or rales Abdomen: +bs, soft, non tender, non distended, no masses, no hepatomegaly, no splenomegaly Pulses: 2+ symmetric, upper and lower extremities, normal cap refill Ext: no edema  Tender right upper thigh, othrewise legs nontender, no deformity    Assessment: Encounter Diagnoses  Name Primary?  . Leukocytosis, unspecified type Yes  . Chronic nausea   . Nausea and vomiting, intractability of vomiting not specified, unspecified vomiting type   . Leg pain, superior, right   . Abnormal PFT     Plan: Unable to have successfully venipuncture today.  Will plan repeat CBC soon  Nausea, vomiting - etiology unclear.  Will consult with GI on this.    PFT improved from last visit on Breo.  F/u as planned tomorrow for VQ scan and leg Korea per cardiology orders  [facility] was seen today for nausea.  Diagnoses and all orders for this visit:  Leukocytosis, unspecified type -     CBC -     POCT Urinalysis DIP (Proadvantage Device)  Chronic nausea  Nausea and vomiting, intractability of vomiting not specified, unspecified vomiting type  Leg pain, superior, right  Abnormal PFT -     Spirometry with Graph

## 2016-08-04 NOTE — Telephone Encounter (Signed)
Looking back over chart, is she taking metformin 1000mg  BID?  Does she feel like this causes her nausea as it can do this?  Likewise, is she taking iron daily that can also cause nausea, constipation?  Does she get symptoms right after taking any of her medications?

## 2016-08-05 NOTE — Telephone Encounter (Signed)
Spoke with pt- she reports that she is taking the Metformin and Iron. She does not feel this is causing her nausea because she has been taking both medications for a while. She states the nausea comes on randomly no matter what she eats, drinks. The nausea does not start right after she takes her medication. Jennifer Cervantes

## 2016-08-06 ENCOUNTER — Ambulatory Visit (HOSPITAL_COMMUNITY)
Admission: RE | Admit: 2016-08-06 | Discharge: 2016-08-06 | Disposition: A | Discharge status: Home / Self Care | Payer: 59 | Source: Ambulatory Visit | Attending: Cardiology | Admitting: Cardiology

## 2016-08-06 ENCOUNTER — Other Ambulatory Visit: Payer: Self-pay | Admitting: Medical

## 2016-08-06 ENCOUNTER — Ambulatory Visit (HOSPITAL_COMMUNITY)
Admission: RE | Admit: 2016-08-06 | Discharge: 2016-08-06 | Disposition: A | Discharge status: Home / Self Care | Payer: 59 | Source: Ambulatory Visit | Attending: Cardiovascular Disease | Admitting: Cardiovascular Disease

## 2016-08-06 ENCOUNTER — Encounter (HOSPITAL_COMMUNITY)
Admission: RE | Admit: 2016-08-06 | Discharge: 2016-08-06 | Disposition: A | Discharge status: Home / Self Care | Payer: 59 | Source: Ambulatory Visit | Attending: Cardiology | Admitting: Cardiology

## 2016-08-06 DIAGNOSIS — R7989 Other specified abnormal findings of blood chemistry: Secondary | ICD-10-CM | POA: Diagnosis not present

## 2016-08-06 DIAGNOSIS — R1030 Lower abdominal pain, unspecified: Secondary | ICD-10-CM | POA: Diagnosis not present

## 2016-08-06 DIAGNOSIS — R0602 Shortness of breath: Secondary | ICD-10-CM

## 2016-08-06 DIAGNOSIS — R1031 Right lower quadrant pain: Secondary | ICD-10-CM

## 2016-08-06 DIAGNOSIS — R079 Chest pain, unspecified: Secondary | ICD-10-CM | POA: Diagnosis not present

## 2016-08-06 MED ORDER — TECHNETIUM TO 99M ALBUMIN AGGREGATED
4.0000 | Freq: Once | INTRAVENOUS | Status: AC | PRN
  Administered 2016-08-06: 4 via INTRAVENOUS

## 2016-08-06 MED ORDER — PROMETHAZINE HCL 12.5 MG PO TABS: 12.5000 mg | ORAL_TABLET | Freq: Four times a day (QID) | ORAL | 0 refills | Status: DC | PRN

## 2016-08-06 MED ORDER — TECHNETIUM TC 99M DIETHYLENETRIAME-PENTAACETIC ACID: 32.0000 | Freq: Once | INTRAVENOUS | Status: DC | PRN

## 2016-08-06 NOTE — Telephone Encounter (Signed)
I forgot to ask, is she sexually active, any chance of pregnancy current? If so, we need to check a serum or urine pregnancy test.  Otherwise   For now have her stop metformin and iron just to help improve on nausea.  I sent phenergan/promethazine to take as needed for nausea.  Caution as this can cause drowsiness.     Have her make f/u with Dr. Havery Moros if the nausea persists as I am not sure what is causing it.   Let me know her answers?

## 2016-08-07 MED FILL — PROMETHAZINE 12.5 MG TABLET: 12.5 | 4 days supply | Qty: 15 | Fill #0

## 2016-08-07 NOTE — Telephone Encounter (Signed)
Called and spoke with pt, said that she is NOT pregnancy , gave her the information about the metformin and phenergan and to follow up with Dr.Armbuster .

## 2016-08-08 ENCOUNTER — Encounter: Payer: Self-pay | Admitting: *Deleted

## 2016-08-13 ENCOUNTER — Telehealth: Payer: Self-pay | Admitting: Cardiology

## 2016-08-13 NOTE — Telephone Encounter (Signed)
None

## 2016-08-13 NOTE — Telephone Encounter (Signed)
Pt states she is unable to work right now with the groin area the way it is and the pain.  Any recommendations?

## 2016-08-13 NOTE — Telephone Encounter (Signed)
Spoke with pt and her job will not let her do just light duty.  They told her to initiate her FMLA again.  FMLA won't re-up unless she has a letter stating she can't work.  Pt states that she is still having severe pain in her groin at the cath site.  Has been icing it 3-4 times a day with no improvement.  Denies heat to area.  Still swollen.  Still having to walk strange due to the pain.  Can't be up on it long.  Seen PCP on 6/11 and he advised that she contact our office if no improvement.  Pt had doppler of groin 6/13 and it was fine.  Advised I would send message to Cecilie Kicks, NP and Dr. Tamala Julian for review and advisement.  Pt appreciative for call.

## 2016-08-13 NOTE — Telephone Encounter (Signed)
All testing has been normal. Okay to return to work. No basis for FMLA.

## 2016-08-13 NOTE — Telephone Encounter (Signed)
New message   1 Job does not accept light duty. FMLA has stop -patient is looking for options.    Patient calling last seen 08/04/16 was put on light duty for her job.      Pt c/o swelling: STAT is pt has developed SOB within 24 hours  1. How long have you been experiencing swelling? About  3 weeks    2. Where is the swelling located? Groin area   3.  Are you currently taking a "fluid pill"? Yes   4.  Are you currently SOB? Always sob -  Do not hear sob over the phone    5.  Have you traveled recently?no

## 2016-08-13 NOTE — Telephone Encounter (Signed)
Spoke with pt and made her aware of information provided by Dr. Tamala Julian.  Pt still concerned about groin.  PCP told pt to follow up with our office in regards to groin.  Scheduled pt to see Cecilie Kicks, NP 6/22 for assessment.  Pt verbalized understanding and was appreciative for assistance.

## 2016-08-15 ENCOUNTER — Ambulatory Visit (INDEPENDENT_AMBULATORY_CARE_PROVIDER_SITE_OTHER): Payer: 59 | Admitting: Cardiology

## 2016-08-15 ENCOUNTER — Encounter: Payer: Self-pay | Admitting: *Deleted

## 2016-08-15 ENCOUNTER — Encounter: Payer: Self-pay | Admitting: Cardiology

## 2016-08-15 VITALS — BP 126/90 | HR 118 | Ht 69.0 in | Wt 234.0 lb

## 2016-08-15 DIAGNOSIS — E119 Type 2 diabetes mellitus without complications: Secondary | ICD-10-CM | POA: Diagnosis not present

## 2016-08-15 DIAGNOSIS — R1031 Right lower quadrant pain: Secondary | ICD-10-CM

## 2016-08-15 DIAGNOSIS — E785 Hyperlipidemia, unspecified: Secondary | ICD-10-CM | POA: Diagnosis not present

## 2016-08-15 DIAGNOSIS — K5900 Constipation, unspecified: Secondary | ICD-10-CM

## 2016-08-15 DIAGNOSIS — R0602 Shortness of breath: Secondary | ICD-10-CM | POA: Diagnosis not present

## 2016-08-15 DIAGNOSIS — I2510 Atherosclerotic heart disease of native coronary artery without angina pectoris: Secondary | ICD-10-CM

## 2016-08-15 NOTE — Progress Notes (Signed)
Cardiology Office Note   Date:  08/15/2016   ID:  Jennifer Cervantes, DOB 12/12/1980, MRN [id number]  PCP:  Carlena Hurl, PA-C  Cardiologist:  Dr. Tamala Julian    Chief Complaint  Patient presents with  . Coronary Artery Disease    leg pain      History of Present Illness: Jennifer Cervantes is a 36 y.o. female who presents for Rt groin pain post cath of 07/17/16.  She very tender at site so we did arterial doppler at site.  Her artery was fine she did have enlarged lymph nodes.   She has been unable to work due to Rt groin pain.    Her WBC was mildly elevated in early June.  No fevers, no abd pain or hip pain.          Pt with a hx in 05/2015 with a non-STEMI. EF was 45% that time. She had high-grade mid to distal LAD stenosis which was treated with overlapping DES 2. She has had several heart catheterizations since. 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  (was seen by Neuro and had PT follow up EMG after PT was negative.)   She was admitted shortly after initial PCI in 5/17 with recurrent chest symptoms. Inpatient nuclear stress test was nonischemic. Cardiac catheterization in 03/2016 demonstrated patent LAD stents. However, she did have a distal LAD stenosis which was treated with a DES. Echocardiogram in 03/2016 demonstrated normal LV function with moderate diastolic dysfunction and trivial AI. She was recently seen by Dr. Tamala Julian 07/16/16. She complained of symptoms consistent with her previous angina that was nitroglycerin responsive. Cardiac catheterization was arranged. This demonstrated patent stents in the LAD. She had mild to moderate nonobstructive disease in the LCx and RCA. Medical therapy was continued.   She was see in the ED at [facility] prior to 07/29/16 visit with R LE swelling and pain. An ultrasound was negative for DVT.  Her DDimer was elevated we did VQ scan which was neg for  PE.  She has had SOB, Chest apin and syncope.  Her BNP was normal. And no further syncope.     With continued Rt groin pain I did arterial doppler and no pseudoaneurysm only enlarged lymph nodes.  Today she is back for eval as she cannot work due to pain. Of rt groin.  No wounds or ulcers on Rt, her glucose is stable per pt.  Continues with chest ain but does not seem to bother her much.  No fevers.       Past Medical History:  Diagnosis Date  . CAD (coronary artery disease)    a. LHC on 06/21/15 which showed 75% occl mid-dist LCx, 40% occl mRCA, 99% mid-dist LAD s/p DES and 50% occl mLAD s/p DES (overlapping stents) and significant LV dysfunction with anteroapical HK; b. 06/2015 Myoview: EF 55-65%, no ischemia/infarct.  . CHF (congestive heart failure) ([place])   . GERD (gastroesophageal reflux disease)   . Hyperlipidemia   . Hypertension   . Hypothyroidism   . Myocardial stunning (HCC)    a. EF 45% on 2D ECHO on 06/21/15 and severe LV dysfunction on LV gram by cath. Repeat 2D ECHO with EF 55-60% and mild HK of the apicoseptal myocardium.  [name] Neuropraxia of right median nerve    a. suspected after right radial cath. will follow with neuro outpatient if does not resolve.   [name]  NSTEMI (non-ST elevated myocardial infarction) [facility]) 05/2015   [facility]  . Obesity   . Right radial artery thrombus ([place])    a. 05/2015 Following PCI (radial access)-->conservatively managed.  . Tobacco abuse   . Tricuspid regurgitation   . Type II diabetes mellitus ([place])    a. Type II  . Vitamin D deficiency     Past Surgical History:  Procedure Laterality Date  . [place] STUDY N/A 07/14/2016   Procedure: [place] STUDY;  Surgeon: Mauri Pole, MD;  Location: [facility] ENDOSCOPY;  Service: Endoscopy;  Laterality: N/A;  . CARDIAC CATHETERIZATION N/A 06/21/2015   Procedure: Left Heart Cath and Coronary Angiography;  Surgeon: Belva Crome, MD; pLAD 99%, CFX 75%, RCA 40%, EF 35%   . CARDIAC CATHETERIZATION  N/A 06/21/2015   Procedure: Coronary Stent Intervention;  Surgeon: Belva Crome, MD;  Promus Premier DES, 3.0 x 20 and 3.5 x 32 mm stents to the LAD  . CARDIAC CATHETERIZATION N/A 06/21/2015   Procedure: Intravascular Ultrasound/IVUS;  Surgeon: Belva Crome, MD;  Location: [place] CV LAB;  Service: Cardiovascular;  Laterality: N/A;  LAD  . CARDIAC CATHETERIZATION  06/22/2015   Procedure: Coronary/Graft Angiography;  Surgeon: Peter M Martinique, MD;  Location: [place] CV LAB;  Service: Cardiovascular;;  . CORONARY STENT INTERVENTION N/A 04/02/2016   Procedure: Coronary Stent Intervention;  Surgeon: Jettie Booze, MD;  Location: [place] CV LAB;  Service: Cardiovascular;  Laterality: N/A;  . ESOPHAGEAL MANOMETRY N/A 07/14/2016   Procedure: ESOPHAGEAL MANOMETRY (EM);  Surgeon: Mauri Pole, MD;  Location: [facility] ENDOSCOPY;  Service: Endoscopy;  Laterality: N/A;  . LEFT HEART CATH AND CORONARY ANGIOGRAPHY N/A 04/02/2016   Procedure: Left Heart Cath and Coronary Angiography;  Surgeon: Jettie Booze, MD;  Location: [place] CV LAB;  Service: Cardiovascular;  Laterality: N/A;  . LEFT HEART CATH AND CORONARY ANGIOGRAPHY N/A 07/17/2016   Procedure: Left Heart Cath and Coronary Angiography;  Surgeon: Leonie Man, MD;  Location: [place] CV LAB;  Service: Cardiovascular;  Laterality: N/A;     Current Outpatient Prescriptions  Medication Sig Dispense Refill  . acetaminophen (TYLENOL) 500 MG tablet Take 500 mg by mouth every 6 (six) hours as needed for headache (pain).    [name] aspirin 81 MG EC tablet Take 1 tablet (81 mg total) by mouth daily. 90 tablet 3  . atorvastatin (LIPITOR) 40 MG tablet Take 1 tablet (40 mg total) by mouth daily at 6 PM. 90 tablet 3  . Cholecalciferol (VITAMIN D) 2000 units tablet Take 1 tablet (2,000 Units total) by mouth daily. 90 tablet 3  . clopidogrel (PLAVIX) 75 MG tablet Take 75 mg by mouth 2 (two) times daily.    . ferrous gluconate (FERGON) 324 MG  tablet Take 1 tablet (324 mg total) by mouth daily with breakfast. 90 tablet 1  . fluticasone furoate-vilanterol (BREO ELLIPTA) 200-25 MCG/INH AEPB Inhale 1 puff into the lungs daily. 28 each 0  . gabapentin (NEURONTIN) 300 MG capsule Take 300 mg by mouth 2 (two) times daily.    . Insulin Glargine (LANTUS SOLOSTAR) 100 UNIT/ML Solostar Pen Inject 10 Units into the skin daily at 10 pm. 5 pen 5  . isosorbide mononitrate (IMDUR) 30 MG 24 hr tablet Take 1 tablet (30 mg total) by mouth daily. 90 tablet 3  . lisinopril-hydrochlorothiazide (PRINZIDE,ZESTORETIC) 10-12.5 MG tablet Take 1 tablet by mouth daily. 90 tablet 3  . metFORMIN (GLUCOPHAGE) 1000 MG tablet Take  1 tablet (1,000 mg total) by mouth 2 (two) times daily with a meal. 180 tablet 3  . metoprolol (LOPRESSOR) 50 MG tablet Take 1 tablet (50 mg total) by mouth 2 (two) times daily. 180 tablet 1  . nicotine (NICODERM CQ - DOSED IN MG/24 HOURS) 21 mg/24hr patch Place 1 patch (21 mg total) onto the skin daily. 28 patch 0  . nitroGLYCERIN (NITROSTAT) 0.4 MG SL tablet Place 1 tablet (0.4 mg total) under the tongue every 5 (five) minutes as needed for chest pain. 25 tablet 3  . ondansetron (ZOFRAN) 4 MG tablet Take 4 mg by mouth every 6 (six) hours as needed for nausea. for nausea  0  . pantoprazole (PROTONIX) 40 MG tablet Take 40 mg by mouth 2 (two) times daily.    . promethazine (PHENERGAN) 12.5 MG tablet Take 1 tablet (12.5 mg total) by mouth every 6 (six) hours as needed for nausea or vomiting. 15 tablet 0   No current facility-administered medications for this visit.     Allergies:   Patient has no known allergies.    Social History:  The patient  reports that she quit smoking about 4 months ago. Her smoking use included Cigarettes. She has a 11.50 pack-year smoking history. She has never used smokeless tobacco. She reports that she does not drink alcohol or use drugs.   Family History:  The patient's family history includes Diabetes in her  maternal aunt, maternal grandmother, and mother; Healthy in her brother, father, and sister; Heart disease in her maternal grandmother.    ROS:  General:no colds or fevers, some weight changes Skin:no rashes or ulcers HEENT:no blurred vision, no congestion CV:see HPI PUL:see HPI GI:no diarrhea constipation or melena, no indigestion GU:no hematuria, no dysuria MS:no joint pain, no claudication Neuro:no syncope, no lightheadedness Endo:+ diabetes, no thyroid disease  Wt Readings from Last 3 Encounters:  08/15/16 234 lb (106.1 kg)  08/04/16 239 lb (108.4 kg)  08/04/16 239 lb 6.4 oz (108.6 kg)     PHYSICAL EXAM: VS:  BP 126/90   Pulse (!) 118   Ht 5\' 9"  (1.753 m)   Wt 234 lb (106.1 kg)   SpO2 99%   BMI 34.56 kg/m  , BMI Body mass index is 34.56 kg/m. General:Pleasant affect, NAD Skin:Warm and dry, brisk capillary refill HEENT:normocephalic, sclera clear, mucus membranes moist Neck:supple, no JVD, no bruits  Heart:S1S2 RRR without murmur, gallup, rub or click Lungs:clear without rales, rhonchi, or wheezes SWF:UXNA, non tender, + BS, do not palpate liver spleen or masses Ext:no lower ext edema, 2+ pedal pulses, 2+ radial pulses, Rt groin more firm on rt. But very tender. Neuro:alert and oriented X 3, MAE, follows commands, + facial symmetry    EKG:  EKG is NOT ordered today.   Recent Labs: 01/25/2016: ALT 10 04/15/2016: TSH 0.56 07/16/2016: BUN 10; Creatinine, Ser 1.10; Hemoglobin 12.7; Platelets 265; Potassium 4.8; Sodium 134 07/29/2016: NT-Pro BNP 19    Lipid Panel    Component Value Date/Time   CHOL 140 01/25/2016 1015   TRIG 63 01/25/2016 1015   HDL 43 (L) 01/25/2016 1015   CHOLHDL 3.3 01/25/2016 1015   VLDL 13 01/25/2016 1015   LDLCALC 84 01/25/2016 1015       Other studies Reviewed: Additional studies/ records that were reviewed today include: . Procedures   Left Heart Cath and Coronary Angiography  Conclusion     Mid Cx to Dist Cx lesion, 50  %stenosed.  Prox LAD lesion, 30 %stenosed -  proximal to long stented segment..  Prox LAD to Mid LAD to Dist LAD overlapping DES stents (Feb 2018), 15 %stenosed. (3 DES stents) - minimal ISR  The left ventricular systolic function is normal.  LV end diastolic pressure is normal.  The left ventricular ejection fraction is 55-65% by visual estimate.   Angiographically minimal change from final Post-PCI angiographic findings from Feb 2018 (DES PCI to dLAD). No new lesion to explain her CP symptoms.  Consider spasm vs. Non-anginal Chest Pain.  Plan: Return to Short Stay Holding area after sheath removal. D/c home after bedrest.     Diagnostic Diagram       Implants     No implant documentation for       ASSESSMENT AND PLAN:  1.  Rt groin pain - arterial doppler with no pseudoaneurysm  + enlarged lymph nodes, very tender difficult for her to walk.   Keep out of work for next week until we can do abd and pelvic CT with contrast.     With radial cath she had neuropathy that improved with PT. If CT neg and labs neg- will recheck CBC then may need to send to neuro again.   2. CAD with hx of stents, now with stable cath 07/17/16. No reason for chest pain.  VQ neg.  For PE.  GI work up is neg.  She is to see Dr. Tamala Julian 09/02/16.  Is on imdur.  GI work up has been negative.  3. Chronic DOE.  Has inhalers.  Has had PFTs with PCP.  4. HLD on statin  5. Chronic constipation  6. DM-2 followed by PCP      Current medicines are reviewed with the patient today.  The patient Has no concerns regarding medicines.  The following changes have been made:  See above Labs/ tests ordered today include:see above  Disposition:   FU:  see above  Signed, Cecilie Kicks, NP  08/15/2016 1:49 PM    [facility] [place], [place], [address] [place], [place] Phone: [phone]; Fax: [phone]

## 2016-08-15 NOTE — Patient Instructions (Addendum)
Your physician recommends that you continue on your current medications as directed. Please refer to the Current Medication list given to you today.  Your physician recommends that you return for lab work in: [place]  CBC   CT OF ABD PELVIS TO [place]   Your physician recommends that you schedule a follow-up appointment in:  AS SCHEDULED

## 2016-08-16 LAB — CBC WITH DIFFERENTIAL/PLATELET
BASOS ABS: 0 10*3/uL (ref 0.0–0.2)
BASOS: 0 %
EOS (ABSOLUTE): 0.3 10*3/uL (ref 0.0–0.4)
EOS: 3 %
HEMATOCRIT: 37 % (ref 34.0–46.6)
HEMOGLOBIN: 12.8 g/dL (ref 11.1–15.9)
IMMATURE GRANS (ABS): 0 10*3/uL (ref 0.0–0.1)
Immature Granulocytes: 0 %
LYMPHS ABS: 3.5 10*3/uL — AB (ref 0.7–3.1)
LYMPHS: 36 %
MCH: 31.1 pg (ref 26.6–33.0)
MCHC: 34.6 g/dL (ref 31.5–35.7)
MCV: 90 fL (ref 79–97)
MONOCYTES: 5 %
Monocytes Absolute: 0.5 10*3/uL (ref 0.1–0.9)
NEUTROS ABS: 5.3 10*3/uL (ref 1.4–7.0)
Neutrophils: 56 %
Platelets: 300 10*3/uL (ref 150–379)
RBC: 4.11 x10E6/uL (ref 3.77–5.28)
RDW: 14.3 % (ref 12.3–15.4)
WBC: 9.6 10*3/uL (ref 3.4–10.8)

## 2016-08-16 LAB — BASIC METABOLIC PANEL
BUN / CREAT RATIO: 10 (ref 9–23)
BUN: 8 mg/dL (ref 6–20)
CO2: 17 mmol/L — ABNORMAL LOW (ref 20–29)
CREATININE: 0.84 mg/dL (ref 0.57–1.00)
Calcium: 9.5 mg/dL (ref 8.7–10.2)
Chloride: 104 mmol/L (ref 96–106)
GFR calc Af Amer: 103 mL/min/{1.73_m2} (ref >59)
GFR, EST NON AFRICAN AMERICAN: 89 mL/min/{1.73_m2} (ref >59)
GLUCOSE: 180 mg/dL — AB (ref 65–99)
Potassium: 4.3 mmol/L (ref 3.5–5.2)
SODIUM: 141 mmol/L (ref 134–144)

## 2016-08-19 ENCOUNTER — Telehealth: Payer: Self-pay | Admitting: Cardiology

## 2016-08-19 NOTE — Telephone Encounter (Signed)
Returned pts call and discussed lab results See phone note.

## 2016-08-19 NOTE — Telephone Encounter (Signed)
-----   Message from Isaiah Serge, NP sent at 08/17/2016  5:12 PM EDT ----- WBC is back to normal.  Labs stable.

## 2016-08-19 NOTE — Telephone Encounter (Signed)
New message ° ° ° °Pt is returning call about labs.  °

## 2016-08-22 ENCOUNTER — Ambulatory Visit (INDEPENDENT_AMBULATORY_CARE_PROVIDER_SITE_OTHER)
Admission: RE | Admit: 2016-08-22 | Discharge: 2016-08-22 | Disposition: A | Discharge status: Home / Self Care | Payer: 59 | Source: Ambulatory Visit | Attending: Cardiology | Admitting: Cardiology

## 2016-08-22 ENCOUNTER — Telehealth: Payer: Self-pay | Admitting: Interventional Cardiology

## 2016-08-22 DIAGNOSIS — K760 Fatty (change of) liver, not elsewhere classified: Secondary | ICD-10-CM | POA: Diagnosis not present

## 2016-08-22 DIAGNOSIS — R1031 Right lower quadrant pain: Secondary | ICD-10-CM | POA: Diagnosis not present

## 2016-08-22 MED ORDER — IOPAMIDOL (ISOVUE-300) INJECTION 61%
100.0000 mL | Freq: Once | INTRAVENOUS | Status: AC | PRN
  Administered 2016-08-22: 100 mL via INTRAVENOUS

## 2016-08-22 NOTE — Telephone Encounter (Signed)
New message    Jennifer Cervantes the pt CT has been read and she wants to speak to rn   She will fax to [phone]

## 2016-08-22 NOTE — Telephone Encounter (Signed)
Spoke with AJ at imaging center.  She was just calling to let us know that she had faxed over the results.  Obtained results and gave them Cecilie Kicks, NP, ordering provider.

## 2016-08-25 ENCOUNTER — Telehealth: Payer: Self-pay | Admitting: *Deleted

## 2016-08-25 DIAGNOSIS — M79606 Pain in leg, unspecified: Secondary | ICD-10-CM

## 2016-08-25 NOTE — Telephone Encounter (Signed)
-----   Message from Isaiah Serge, NP sent at 08/25/2016 10:12 AM EDT ----- Please ask pt to see neuro and OB GYN she has seen neuro in the past [facility].  Thanks. Her CT of abd and pelvis were fine except for cyst  We think but needs GYN to review. And neuro in that this may be a nerve prorble in her leg that was sismilar to radial nerve pain.  thanks

## 2016-09-01 NOTE — Progress Notes (Signed)
Cardiology Office Note    Date:  09/01/2016   ID:  Jennifer Cervantes, DOB Mar 14, 1980, MRN [id number]  PCP:  Carlena Hurl, PA-C  Cardiologist: Sinclair Grooms, MD diabetes mellitus,  No chief complaint on file.   History of Present Illness:  Jennifer Cervantes is a 36 y.o. female with history of CAD, acute intervention for occluded LAD April 2017, repeat intervention in May 2017, development of neuropraxia in the right hand following catheterization, chronic diastolic heart failure, hyperlipidemia, and repeat cardiac catheterization from the femoral in 2018 resulting in chronic right groin pain.  Complains of dyspnea on exertion. Orthopnea/PND is also prevalent. Initially after stenting we felt shortness of breath was related to Brilinta. He got no better with discontinuation of the medication. In May she presented with recurrent chest pressure that we thought was cardiac/ischemic. She underwent coronary angiography by Dr. Ellyn Hack via the right femoral approach since she had neuropraxia and loss of right radial pulse after her second radial cath. Catheterization demonstrated widely patent coronaries including the stented regions. She has not developed chronic pain in the right femoral access site. There is no weakness. No swelling. Pseudoaneurysm and hematoma have been excluded by duplex scanning. She is unable to work because of dyspnea and leg pain.  Jennifer Cervantes is also being bothered by palpitations/heart racing with physical activity. Today upon check-in a heart rate was 125 and an EKG demonstrated sinus tachycardia at a rate of 115 bpm. EKG is otherwise unremarkable.  Past Medical History:  Diagnosis Date  . CAD (coronary artery disease)    a. LHC on 06/21/15 which showed 75% occl mid-dist LCx, 40% occl mRCA, 99% mid-dist LAD s/p DES and 50% occl mLAD s/p DES (overlapping stents) and significant LV dysfunction with anteroapical HK; b. 06/2015 Myoview: EF 55-65%, no ischemia/infarct.  .  CHF (congestive heart failure) ([place])   . GERD (gastroesophageal reflux disease)   . Hyperlipidemia   . Hypertension   . Hypothyroidism   . Myocardial stunning (HCC)    a. EF 45% on 2D ECHO on 06/21/15 and severe LV dysfunction on LV gram by cath. Repeat 2D ECHO with EF 55-60% and mild HK of the apicoseptal myocardium.  [name] Neuropraxia of right median nerve    a. suspected after right radial cath. will follow with neuro outpatient if does not resolve.   . NSTEMI (non-ST elevated myocardial infarction) [facility]) 05/2015   [facility]  . Obesity   . Right radial artery thrombus ([place])    a. 05/2015 Following PCI (radial access)-->conservatively managed.  . Tobacco abuse   . Tricuspid regurgitation   . Type II diabetes mellitus ([place])    a. Type II  . Vitamin D deficiency     Past Surgical History:  Procedure Laterality Date  . [place] STUDY N/A 07/14/2016   Procedure: [place] STUDY;  Surgeon: Mauri Pole, MD;  Location: [facility] END[facility]COPY;  Service: Endoscopy;  Laterality: N/A;  . CARDIAC CATHETERIZATION N/A 06/21/2015   Procedure: Left Heart Cath and Coronary Angiography;  Surgeon: Belva Crome, MD; pLAD 99%, CFX 75%, RCA 40%, EF 35%   . CARDIAC CATHETERIZATION N/A 06/21/2015   Procedure: Coronary Stent Intervention;  Surgeon: Belva Crome, MD;  Promus Premier DES, 3.0 x 20 and 3.5 x 32 mm stents to the LAD  . CARDIAC CATHETERIZATION N/A 06/21/2015   Procedure: Intravascular Ultrasound/IVUS;  Surgeon: Belva Crome, MD;  Location: [place] CV LAB;  Service: Cardiovascular;  Laterality: N/A;  LAD  . CARDIAC CATHETERIZATION  06/22/2015   Procedure: Coronary/Graft Angiography;  Surgeon: Peter M Martinique, MD;  Location: [place] CV LAB;  Service: Cardiovascular;;  . CORONARY STENT INTERVENTION N/A 04/02/2016   Procedure: Coronary Stent Intervention;  Surgeon: Jettie Booze, MD;  Location: [place] CV LAB;  Service: Cardiovascular;  Laterality: N/A;  . ESOPHAGEAL MANOMETRY N/A  07/14/2016   Procedure: ESOPHAGEAL MANOMETRY (EM);  Surgeon: Mauri Pole, MD;  Location: [facility];  Service: Endoscopy;  Laterality: N/A;  . LEFT HEART CATH AND CORONARY ANGIOGRAPHY N/A 04/02/2016   Procedure: Left Heart Cath and Coronary Angiography;  Surgeon: Jettie Booze, MD;  Location: [place] CV LAB;  Service: Cardiovascular;  Laterality: N/A;  . LEFT HEART CATH AND CORONARY ANGIOGRAPHY N/A 07/17/2016   Procedure: Left Heart Cath and Coronary Angiography;  Surgeon: Leonie Man, MD;  Location: [place] [facility];  Service: Cardiovascular;  Laterality: N/A;    Current Medications: Outpatient Medications Prior to Visit  Medication Sig Dispense Refill  . acetaminophen (TYLENOL) 500 MG tablet Take 500 mg by mouth every 6 (six) hours as needed for headache (pain).    [name] aspirin 81 MG EC tablet Take 1 tablet (81 mg total) by mouth daily. 90 tablet 3  . atorvastatin (LIPITOR) 40 MG tablet Take 1 tablet (40 mg total) by mouth daily at 6 PM. 90 tablet 3  . Cholecalciferol (VITAMIN D) 2000 units tablet Take 1 tablet (2,000 Units total) by mouth daily. 90 tablet 3  . clopidogrel (PLAVIX) 75 MG tablet Take 75 mg by mouth 2 (two) times daily.    . ferrous gluconate (FERGON) 324 MG tablet Take 1 tablet (324 mg total) by mouth daily with breakfast. 90 tablet 1  . fluticasone furoate-vilanterol (BREO ELLIPTA) 200-25 MCG/INH AEPB Inhale 1 puff into the lungs daily. 28 each 0  . gabapentin (NEURONTIN) 300 MG capsule Take 300 mg by mouth 2 (two) times daily.    . Insulin Glargine (LANTUS SOLOSTAR) 100 UNIT/ML Solostar Pen Inject 10 Units into the skin daily at 10 pm. 5 pen 5  . isosorbide mononitrate (IMDUR) 30 MG 24 hr tablet Take 1 tablet (30 mg total) by mouth daily. 90 tablet 3  . lisinopril-hydrochlorothiazide (PRINZIDE,ZESTORETIC) 10-12.5 MG tablet Take 1 tablet by mouth daily. 90 tablet 3  . metFORMIN (GLUCOPHAGE) 1000 MG tablet Take 1 tablet (1,000 mg total) by mouth 2 (two)  times daily with a meal. 180 tablet 3  . metoprolol (LOPRESSOR) 50 MG tablet Take 1 tablet (50 mg total) by mouth 2 (two) times daily. 180 tablet 1  . nicotine (NICODERM CQ - DOSED IN MG/24 HOURS) 21 mg/24hr patch Place 1 patch (21 mg total) onto the skin daily. 28 patch 0  . nitroGLYCERIN (NITROSTAT) 0.4 MG SL tablet Place 1 tablet (0.4 mg total) under the tongue every 5 (five) minutes as needed for chest pain. 25 tablet 3  . ondansetron (ZOFRAN) 4 MG tablet Take 4 mg by mouth every 6 (six) hours as needed for nausea. for nausea  0  . pantoprazole (PROTONIX) 40 MG tablet Take 40 mg by mouth 2 (two) times daily.    . promethazine (PHENERGAN) 12.5 MG tablet Take 1 tablet (12.5 mg total) by mouth every 6 (six) hours as needed for nausea or vomiting. 15 tablet 0   No facility-administered medications prior to visit.      Allergies:   Patient has no known allergies.   Social History  Social History  . Marital status: Single    Spouse name: N/A  . Number of children: 4  . Years of education: 12   Occupational History  . CNA [employer]   Social History Main Topics  . Smoking status: Former Smoker    Packs/day: 0.50    Years: 23.00    Types: Cigarettes    Quit date: 04/01/2016  . Smokeless tobacco: Never Used  . Alcohol use No  . Drug use: No  . Sexual activity: Not on file   Other Topics Concern  . Not on file   Social History Narrative   Lives in [place], works nights at [employer], [place]   Right-handed   [place] 1 soda a day   Exercises some with walking   Lives with her 2 yo son   Likes her space, doesn't want a significant other   03/2016     Family History:  The patient's family history includes Diabetes in her maternal aunt, maternal grandmother, and mother; Healthy in her brother, father, and sister; Heart disease in her maternal grandmother.   ROS:   Please see the history of present illness.    Dizziness, easy bruising, constipation, nausea. Right leg pain but with no  objective findings of pulse deficit, hematoma, or other.  All other systems reviewed and are negative.   PHYSICAL EXAM:   VS:  There were no vitals taken for this visit.   GEN: Well nourished, well developed, in no acute distress  HEENT: normal  Neck: no JVD, carotid bruits, or masses Cardiac: RRR; no murmurs, rubs, or gallops,no edema  Respiratory:  clear to auscultation bilaterally, normal work of breathing GI: soft, nontender, nondistended, + BS MS: no deformity or atrophy  Skin: warm and dry, no rash Neuro:  Alert and Oriented x 3, Strength and sensation are intact Psych: euthymic mood, full affect  Wt Readings from Last 3 Encounters:  08/15/16 234 lb (106.1 kg)  08/04/16 239 lb (108.4 kg)  08/04/16 239 lb 6.4 oz (108.6 kg)      Studies/Labs Reviewed:   EKG:  EKG  Sinus tachycardia, biatrial abnormality,  Recent Labs: 01/25/2016: ALT 10 04/15/2016: TSH 0.56 07/29/2016: NT-Pro BNP 19 08/15/2016: BUN 8; Creatinine, Ser 0.84; Hemoglobin 12.8; Platelets 300; Potassium 4.3; Sodium 141   Lipid Panel    Component Value Date/Time   CHOL 140 01/25/2016 1015   TRIG 63 01/25/2016 1015   HDL 43 (L) 01/25/2016 1015   CHOLHDL 3.3 01/25/2016 1015   VLDL 13 01/25/2016 1015   LDLCALC 84 01/25/2016 1015    Additional studies/ records that were reviewed today include:  Most recent coronary angiogram was reviewed. LVEDP at the time of last catheterization 07/17/16, was unremarkable.  Most recent vascular studies were reviewed.    ASSESSMENT:    1. Coronary artery disease involving native coronary artery of native heart with angina pectoris ([place])   2. Chronic diastolic heart failure ([place])   3. Neurapraxia of right median nerve, sequela   4. Diabetes mellitus with complication ([place])   5. Tobacco abuse   6. Other hyperlipidemia      PLAN:  In order of problems listed above:  1. Widely patent coronaries. Continues to have chest tightness but etiology is uncertain. 2. Chronic  diastolic heart failure with orthopnea/PND. Despite normal LVEDP on last catheterization I will increased diuretic intensity to HCTZ 25 mg daily and increasing intensity of lisinopril to 20 mg per day. Imdur is discontinued. 3. Right arm/radial related  neurapraxia following radial cath. Now with right groin pain of uncertain etiology. Duplex studies have not demonstrated pseudoaneurysm or hematoma. Possibly neuropraxia in this distribution as well? 4. Not addressed 5. Has discontinued 6. LDL target less than 70.  Plan today is to increase diuretic intensity to determine if orthopnea improves. Neuro follow-up for right groin pain and question neuropraxia. At this point there are multiple issues for which I have no answer follow-up 4 weeks. Basic metabolic panel will need to be performed on return.    Medication Adjustments/Labs and Tests Ordered: Current medicines are reviewed at length with the patient today.  Concerns regarding medicines are outlined above.  Medication changes, Labs and Tests ordered today are listed in the Patient Instructions below. There are no Patient Instructions on file for this visit.   Signed, Sinclair Grooms, MD  09/01/2016 6:18 PM    [facility] [address], [place], [place]  [address] Phone: [phone]; Fax: [phone]

## 2016-09-02 ENCOUNTER — Ambulatory Visit (INDEPENDENT_AMBULATORY_CARE_PROVIDER_SITE_OTHER): Payer: 59 | Admitting: Interventional Cardiology

## 2016-09-02 ENCOUNTER — Encounter: Payer: Self-pay | Admitting: Interventional Cardiology

## 2016-09-02 VITALS — BP 130/80 | HR 124 | Ht 69.0 in | Wt 240.0 lb

## 2016-09-02 DIAGNOSIS — I5032 Chronic diastolic (congestive) heart failure: Secondary | ICD-10-CM

## 2016-09-02 DIAGNOSIS — Z720 Tobacco use: Secondary | ICD-10-CM | POA: Diagnosis not present

## 2016-09-02 DIAGNOSIS — E118 Type 2 diabetes mellitus with unspecified complications: Secondary | ICD-10-CM

## 2016-09-02 DIAGNOSIS — E7849 Other hyperlipidemia: Secondary | ICD-10-CM

## 2016-09-02 DIAGNOSIS — S5411XS Injury of median nerve at forearm level, right arm, sequela: Secondary | ICD-10-CM

## 2016-09-02 DIAGNOSIS — E784 Other hyperlipidemia: Secondary | ICD-10-CM

## 2016-09-02 DIAGNOSIS — I25119 Atherosclerotic heart disease of native coronary artery with unspecified angina pectoris: Secondary | ICD-10-CM

## 2016-09-02 MED ORDER — LISINOPRIL-HYDROCHLOROTHIAZIDE 20-25 MG PO TABS: 1.0000 | ORAL_TABLET | Freq: Every day | ORAL | 3 refills | Status: DC

## 2016-09-02 MED FILL — LISINOPRIL-HCTZ 20-25 MG TA: 20-25 | 90 days supply | Qty: 90 | Fill #0

## 2016-09-02 NOTE — Patient Instructions (Signed)
Medication Instructions:  1) INCREASE Lisinopril-Hydrochlorothiazide to 20/25mg  once daily 2) DISCONTINUE Isosorbide   Labwork: None  Testing/Procedures: None  Follow-Up: Your physician recommends that you schedule a follow-up appointment in: 1 month with Dr. Tamala Julian.   Any Other Special Instructions Will Be Listed Below (If Applicable).     If you need a refill on your cardiac medications before your next appointment, please call your pharmacy.

## 2016-09-03 ENCOUNTER — Encounter: Payer: Self-pay | Admitting: Gastroenterology

## 2016-09-03 ENCOUNTER — Ambulatory Visit (INDEPENDENT_AMBULATORY_CARE_PROVIDER_SITE_OTHER): Payer: 59 | Admitting: Gastroenterology

## 2016-09-03 VITALS — BP 100/72 | HR 100 | Ht 69.0 in | Wt 239.0 lb

## 2016-09-03 DIAGNOSIS — R12 Heartburn: Secondary | ICD-10-CM | POA: Diagnosis not present

## 2016-09-03 DIAGNOSIS — R1013 Epigastric pain: Secondary | ICD-10-CM

## 2016-09-03 DIAGNOSIS — K760 Fatty (change of) liver, not elsewhere classified: Secondary | ICD-10-CM | POA: Diagnosis not present

## 2016-09-03 DIAGNOSIS — K5900 Constipation, unspecified: Secondary | ICD-10-CM

## 2016-09-03 DIAGNOSIS — R1310 Dysphagia, unspecified: Secondary | ICD-10-CM | POA: Diagnosis not present

## 2016-09-03 MED ORDER — LINACLOTIDE 145 MCG PO CAPS: 145.0000 ug | ORAL_CAPSULE | Freq: Every day | ORAL | 1 refills | Status: DC

## 2016-09-03 NOTE — Progress Notes (Signed)
HPI :  36 y.o.femalewith a history ofuncontrolled DM, HTN,morbid obesity, tobacco abuse, MR/TR and CAD s/p DES x2 to mLAD (05/2015) and DES to dLAD (03/2016) who presents for a follow up visit for history of reflux and dysphagia, and abdominal pain.  See prior consult note for full details of her history.   She was recently evaluated by cardiology and had another cardiac cath - they are not sure if she is having spasm causing angina, cath seemed to show stable findings without any occlusive changes.  Since her last visit we increased her protonix to 40mg  twice daily.  H pylori serology negative Barium swallow was normal on 06/18/16 RUQ US obtained on 07/11/2016 which showed steatosis but normal gallbladder CT scan on 08/22/16 in the ER - 12mm uterine fundus cyst, hepatic steatosis  Esophageal manometry and 24 HR pH impedance testing done to evaluate her symptoms of dysphagia / reflux / chronic epigastric pain. On protonix twice daily her Demeester score is 5, indicating good control of acid reflux. She did not have any nonacid reflux on this exam. Further, esophageal manometry was normal, there is no evidence of a motility disorder or spasm to cause her symptoms.  She reports she continues to have a lot of heartburn which bothers her despite protonix 40mg  BID. She feels it daily / frequently. She is on protonix twice daily - she is not sure how much it is helping. She has not been on other PPIs. She denies regurgitation, mostly burning heartburn. She continues to have some nausea. She is not vomiting much, she previously had some of this. She denies any dysphagia. She has some postprandial discomfort in her upper abdomen which come and go but not bothering her too much at present.  Tried on Linzess 155mcg for constipation. When she took it she thinks it helped but never filled a prescription. She reports the constipation has returned and asking for a prescription for it.    No FH of  esophageal or colon cancer.   No prior upper endoscopy.  No prior colonoscopy.    Past Medical History:  Diagnosis Date  . CAD (coronary artery disease)    a. LHC on 06/21/15 which showed 75% occl mid-dist LCx, 40% occl mRCA, 99% mid-dist LAD s/p DES and 50% occl mLAD s/p DES (overlapping stents) and significant LV dysfunction with anteroapical HK; b. 06/2015 Myoview: EF 55-65%, no ischemia/infarct.  . CHF (congestive heart failure) ([place])   . GERD (gastroesophageal reflux disease)   . Hyperlipidemia   . Hypertension   . Hypothyroidism   . Myocardial stunning (HCC)    a. EF 45% on 2D ECHO on 06/21/15 and severe LV dysfunction on LV gram by cath. Repeat 2D ECHO with EF 55-60% and mild HK of the apicoseptal myocardium.  [name] Neuropraxia of right median nerve    a. suspected after right radial cath. will follow with neuro outpatient if does not resolve.   . NSTEMI (non-ST elevated myocardial infarction) [facility]) 05/2015   [facility]  . Obesity   . 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 05/2015 Following PCI (radial access)-->conservatively managed.  . Tobacco abuse   . Tricuspid regurgitation   . Type II diabetes mellitus ([place])    a. Type II  . Vitamin D deficiency      Past Surgical History:  Procedure Laterality Date  . [place] STUDY N/A 07/14/2016   Procedure: [place] STUDY;  Surgeon: Mauri Pole, MD;  Location:  [facility];  Service: Endoscopy;  Laterality: N/A;  . CARDIAC CATHETERIZATION N/A 06/21/2015   Procedure: Left Heart Cath and Coronary Angiography;  Surgeon: Belva Crome, MD; pLAD 99%, CFX 75%, RCA 40%, EF 35%   . CARDIAC CATHETERIZATION N/A 06/21/2015   Procedure: Coronary Stent Intervention;  Surgeon: Belva Crome, MD;  Promus Premier DES, 3.0 x 20 and 3.5 x 32 mm stents to the LAD  . CARDIAC CATHETERIZATION N/A 06/21/2015   Procedure: Intravascular Ultrasound/IVUS;  Surgeon: Belva Crome, MD;  Location: [place] [facility];  Service: Cardiovascular;   Laterality: N/A;  LAD  . CARDIAC CATHETERIZATION  06/22/2015   Procedure: Coronary/Graft Angiography;  Surgeon: Peter M Martinique, MD;  Location: [place] [facility];  Service: Cardiovascular;;  . CORONARY STENT INTERVENTION N/A 04/02/2016   Procedure: Coronary Stent Intervention;  Surgeon: Jettie Booze, MD;  Location: [place] [facility];  Service: Cardiovascular;  Laterality: N/A;  . ESOPHAGEAL MANOMETRY N/A 07/14/2016   Procedure: ESOPHAGEAL MANOMETRY (EM);  Surgeon: Mauri Pole, MD;  Location: [facility];  Service: Endoscopy;  Laterality: N/A;  . LEFT HEART CATH AND CORONARY ANGIOGRAPHY N/A 04/02/2016   Procedure: Left Heart Cath and Coronary Angiography;  Surgeon: Jettie Booze, MD;  Location: [place] [facility];  Service: Cardiovascular;  Laterality: N/A;  . LEFT HEART CATH AND CORONARY ANGIOGRAPHY N/A 07/17/2016   Procedure: Left Heart Cath and Coronary Angiography;  Surgeon: Leonie Man, MD;  Location: [place] [facility];  Service: Cardiovascular;  Laterality: N/A;   Family History  Problem Relation Age of Onset  . Diabetes Mother   . Healthy Father   . Healthy Sister   . Healthy Brother   . Diabetes Maternal Grandmother   . Heart disease Maternal Grandmother   . Diabetes Maternal Aunt        x 2  . Heart attack Neg Hx   . Heart failure Neg Hx   . Stroke Neg Hx   . Hyperlipidemia Neg Hx   . Hypertension Neg Hx   . Stomach cancer Neg Hx   . Colon cancer Neg Hx    Social History  Substance Use Topics  . Smoking status: Former Smoker    Packs/day: 0.50    Years: 23.00    Types: Cigarettes    Quit date: 04/01/2016  . Smokeless tobacco: Never Used  . Alcohol use No   Current Outpatient Prescriptions  Medication Sig Dispense Refill  . acetaminophen (TYLENOL) 500 MG tablet Take 500 mg by mouth every 6 (six) hours as needed for headache (pain).    [name] aspirin 81 MG EC tablet Take 1 tablet (81 mg total) by mouth daily. 90 tablet 3  . atorvastatin (LIPITOR) 40 MG  tablet Take 1 tablet (40 mg total) by mouth daily at 6 PM. 90 tablet 3  . Cholecalciferol (VITAMIN D) 2000 units tablet Take 1 tablet (2,000 Units total) by mouth daily. 90 tablet 3  . clopidogrel (PLAVIX) 75 MG tablet Take 75 mg by mouth 2 (two) times daily.    . ferrous gluconate (FERGON) 324 MG tablet Take 1 tablet (324 mg total) by mouth daily with breakfast. 90 tablet 1  . fluticasone furoate-vilanterol (BREO ELLIPTA) 200-25 MCG/INH AEPB Inhale 1 puff into the lungs daily. 28 each 0  . gabapentin (NEURONTIN) 300 MG capsule Take 300 mg by mouth 2 (two) times daily.    . Insulin Glargine (LANTUS SOLOSTAR) 100 UNIT/ML Solostar Pen Inject 10 Units into the skin  daily at 10 pm. 5 pen 5  . lisinopril-hydrochlorothiazide (PRINZIDE,ZESTORETIC) 20-25 MG tablet Take 1 tablet by mouth daily. 90 tablet 3  . metFORMIN (GLUCOPHAGE) 1000 MG tablet Take 1 tablet (1,000 mg total) by mouth 2 (two) times daily with a meal. 180 tablet 3  . metoprolol (LOPRESSOR) 50 MG tablet Take 1 tablet (50 mg total) by mouth 2 (two) times daily. 180 tablet 1  . nicotine (NICODERM CQ - DOSED IN MG/24 HOURS) 21 mg/24hr patch Place 1 patch (21 mg total) onto the skin daily. 28 patch 0  . nitroGLYCERIN (NITROSTAT) 0.4 MG SL tablet Place 1 tablet (0.4 mg total) under the tongue every 5 (five) minutes as needed for chest pain. 25 tablet 3  . ondansetron (ZOFRAN) 4 MG tablet Take 4 mg by mouth every 6 (six) hours as needed for nausea. for nausea  0  . pantoprazole (PROTONIX) 40 MG tablet Take 40 mg by mouth 2 (two) times daily.    . promethazine (PHENERGAN) 12.5 MG tablet Take 1 tablet (12.5 mg total) by mouth every 6 (six) hours as needed for nausea or vomiting. 15 tablet 0   No current facility-administered medications for this visit.    No Known Allergies   Review of Systems: All systems reviewed and negative except where noted in HPI.    Dg Chest 2 View  Result Date: 08/06/2016 CLINICAL DATA:  Shortness of breath and  left-sided chest pain EXAM: CHEST  2 VIEW COMPARISON:  04/01/2016 FINDINGS: The heart size and mediastinal contours are within normal limits. Both lungs are clear. The visualized skeletal structures are unremarkable. IMPRESSION: No active cardiopulmonary disease. Electronically Signed   By: Inez Catalina M.D.   On: 08/06/2016 10:20   Ct Abdomen Pelvis W Contrast  Result Date: 08/22/2016 CLINICAL DATA:  Right inguinal and Pelvic pain for 1 month, status post cardiac catheterization. CHF diabetes. EXAM: CT ABDOMEN AND PELVIS WITH CONTRAST TECHNIQUE: Multidetector CT imaging of the abdomen and pelvis was performed using the standard protocol following bolus administration of intravenous contrast. CONTRAST:  176mL ISOVUE-300 IOPAMIDOL (ISOVUE-300) INJECTION 61% COMPARISON:  None. FINDINGS: Lower chest: Minor bibasilar atelectasis. Coronary stents noted. Normal heart size. No pericardial effusion. Hepatobiliary: Diffuse hypoattenuation of the liver compatible with hepatic steatosis. No focal hepatic abnormality or biliary dilatation. Patent hepatic and portal veins. Gallbladder is collapsed. Biliary system unremarkable. Pancreas: Unremarkable. No pancreatic ductal dilatation or surrounding inflammatory changes. Spleen: Normal in size without focal abnormality. Adrenals/Urinary Tract: Adrenal glands are unremarkable. Kidneys are normal, without renal calculi, focal lesion, or hydronephrosis. Bladder is unremarkable. Stomach/Bowel: Stomach is within normal limits. Appendix appears normal. No evidence of bowel wall thickening, distention, or inflammatory changes. Vascular/Lymphatic: Aortoiliac atherosclerosis noted. Negative for aneurysm or dissection. No occlusive vascular process. No adenopathy. Reproductive: Small round 13 mm cystic structure in the uterine fundus confirmed on the sagittal and coronal reconstructions, remain nonspecific. Recommend correlation with pregnancy test to exclude early IUP. Also consider  correlation with pelvic ultrasound. Ovaries are normal in size. No adnexal abnormality. Trace pelvic free fluid likely physiologic Other: No abdominal wall hernia or abnormality. No abdominopelvic ascites. Musculoskeletal: No acute osseous finding. Minor SI joint arthropathy bilaterally. No right inguinal abnormality by CT following recent vascular catheterization. Femoral arteries and veins appear patent and symmetric. No soft tissue abnormality, hematoma, or pseudoaneurysm. IMPRESSION: Negative for right inguinal abnormality or visualized complication of recent cardiac catheterization. No retroperitoneal hematoma. Hepatic steatosis Aortoiliac atherosclerosis 13 mm uterine fundus round hypodense cyst like abnormality. Small  early IUP could have this appearance. Recommend correlation with pregnancy test and consider pelvic ultrasound. These results will be called to the ordering clinician or representative by the Radiologist Assistant, and communication documented in the PACS or zVision Dashboard. Electronically Signed   By: Jerilynn Mages.  Shick M.D.   On: 08/22/2016 11:06   Nm Pulmonary Perf And Vent  Result Date: 08/06/2016 CLINICAL DATA:  Shortness of breath with chest pain and right lower leg swelling. Previous cardiac catheterization. Elevated D-dimer levels. EXAM: NUCLEAR MEDICINE VENTILATION - PERFUSION LUNG SCAN TECHNIQUE: Ventilation images were obtained in multiple projections using inhaled aerosol Tc-38m DTPA. Perfusion images were obtained in multiple projections after intravenous injection of Tc-46m MAA. RADIOPHARMACEUTICALS:  31.0 mCi Technetium-65m DTPA aerosol inhalation and 4.2 mCi Technetium-33m MAA IV COMPARISON:  Chest radiographs today. Right lower extremity venous Doppler 07/28/2016. FINDINGS: Ventilation: Normal.  There are no focal ventilation defects. Perfusion: Normal.  There are no focal perfusion defects. IMPRESSION: Normal examination.  No evidence of pulmonary embolism. Electronically Signed    By: Richardean Sale M.D.   On: 08/06/2016 13:57   Lab Results  Component Value Date   WBC 9.6 08/15/2016   HGB 12.8 08/15/2016   HCT 37.0 08/15/2016   MCV 90 08/15/2016   PLT 300 08/15/2016    Lab Results  Component Value Date   CREATININE 0.84 08/15/2016   BUN 8 08/15/2016   NA 141 08/15/2016   K 4.3 08/15/2016   CL 104 08/15/2016   CO2 17 (L) 08/15/2016    Lab Results  Component Value Date   ALT 10 01/25/2016   AST 14 01/25/2016   ALKPHOS 72 01/25/2016   BILITOT 0.4 01/25/2016      Physical Exam: BP 100/72   Pulse 100   Ht 5\' 9"  (1.753 m)   Wt 239 lb (108.4 kg)   BMI 35.29 kg/m  Constitutional: Pleasant,well-developed, female in no acute distress. HEENT: Normocephalic and atraumatic. Conjunctivae are normal. No scleral icterus. Neck supple.  Cardiovascular: Normal rate, regular rhythm.  Pulmonary/chest: Effort normal and breath sounds normal. No wheezing, rales or rhonchi. Abdominal: Soft, nondistended, nontender.  There are no masses palpable. No hepatomegaly. Extremities: no edema Lymphadenopathy: No cervical adenopathy noted. Neurological: Alert and oriented to person place and time. Skin: Skin is warm and dry. No rashes noted. Psychiatric: Normal mood and affect. Behavior is normal.   ASSESSMENT AND PLAN: 36 y/o female here for reassessment of the following issues:  Functional heartburn / Dyspepsia / Dysphagia - symptoms persist on protonix 40mg  BID. H pylori negative. Barium swallow without any pathology. Esophageal manometry normal. PH testing shows normal Demeester score, no significant nonacid reflux. We discussed that with her persistent symptoms, EGD is indicated, but we had hoped to avoid that given her recent cardiac history. I offered her a diagnostic EGD while on antiplatelet therapy if cardiology cleared her, but she declined - main thing to rule out would be EoE which could cause this. Otherwise, I suspect she more than likely has functional  heartburn / dysphagia given her workup to date. I offered her a trial of TCA to help treat this (desipramine) and discussed risks / benefits. Following a discussion of this, she declined, she doesn't want to add any further medications to her regimen at this time. Up to her if she wishes to continue PPI at this time, if she feels worse stopping it, she can continue it as needed.   Constipation - did well on Linzess 18mcg daily, will refill this  for her.   Fatty liver - counseled her on this, avoid alcohol, recommend goal weight loss. LFTs normal, monitor yearly.  Southworth Cellar, MD [facility] Pager [phone]

## 2016-09-15 DIAGNOSIS — I2510 Atherosclerotic heart disease of native coronary artery without angina pectoris: Secondary | ICD-10-CM | POA: Diagnosis not present

## 2016-09-15 DIAGNOSIS — R0789 Other chest pain: Secondary | ICD-10-CM | POA: Diagnosis not present

## 2016-09-15 DIAGNOSIS — Z87891 Personal history of nicotine dependence: Secondary | ICD-10-CM | POA: Diagnosis not present

## 2016-09-15 DIAGNOSIS — Z79899 Other long term (current) drug therapy: Secondary | ICD-10-CM | POA: Diagnosis not present

## 2016-09-15 DIAGNOSIS — I252 Old myocardial infarction: Secondary | ICD-10-CM | POA: Diagnosis not present

## 2016-09-15 DIAGNOSIS — R079 Chest pain, unspecified: Secondary | ICD-10-CM | POA: Diagnosis not present

## 2016-09-15 DIAGNOSIS — E1140 Type 2 diabetes mellitus with diabetic neuropathy, unspecified: Secondary | ICD-10-CM | POA: Diagnosis not present

## 2016-09-15 DIAGNOSIS — I10 Essential (primary) hypertension: Secondary | ICD-10-CM | POA: Diagnosis not present

## 2016-09-15 DIAGNOSIS — Z955 Presence of coronary angioplasty implant and graft: Secondary | ICD-10-CM | POA: Diagnosis not present

## 2016-09-15 DIAGNOSIS — R0602 Shortness of breath: Secondary | ICD-10-CM | POA: Diagnosis not present

## 2016-09-16 DIAGNOSIS — Z955 Presence of coronary angioplasty implant and graft: Secondary | ICD-10-CM | POA: Diagnosis not present

## 2016-09-16 DIAGNOSIS — E1140 Type 2 diabetes mellitus with diabetic neuropathy, unspecified: Secondary | ICD-10-CM | POA: Diagnosis not present

## 2016-09-16 DIAGNOSIS — I10 Essential (primary) hypertension: Secondary | ICD-10-CM | POA: Diagnosis not present

## 2016-09-16 DIAGNOSIS — I2510 Atherosclerotic heart disease of native coronary artery without angina pectoris: Secondary | ICD-10-CM | POA: Diagnosis not present

## 2016-09-16 DIAGNOSIS — Z87891 Personal history of nicotine dependence: Secondary | ICD-10-CM | POA: Diagnosis not present

## 2016-09-16 DIAGNOSIS — R079 Chest pain, unspecified: Secondary | ICD-10-CM | POA: Diagnosis not present

## 2016-09-16 DIAGNOSIS — R0602 Shortness of breath: Secondary | ICD-10-CM | POA: Diagnosis not present

## 2016-09-16 DIAGNOSIS — Z79899 Other long term (current) drug therapy: Secondary | ICD-10-CM | POA: Diagnosis not present

## 2016-09-16 DIAGNOSIS — I252 Old myocardial infarction: Secondary | ICD-10-CM | POA: Diagnosis not present

## 2016-09-22 ENCOUNTER — Ambulatory Visit: Payer: 59 | Admitting: Nutrition

## 2016-09-23 ENCOUNTER — Ambulatory Visit: Payer: 59 | Admitting: Physician Assistant

## 2016-09-28 NOTE — Progress Notes (Signed)
Cardiology Office Note:    Date:  09/29/2016   ID:  Jennifer Cervantes, DOB 03-28-1980, MRN [id number]  PCP:  Carlena Hurl, PA-C  Cardiologist:  Dr. Daneen Schick    Referring MD: Glade Lloyd Camelia Eng, PA-C   Chief Complaint  Patient presents with  . Hospitalization Follow-up    History of Present Illness:    Jennifer Cervantes is a 36 y.o. female with a hx of CAD s/p multiple PCI procedures, uncontrolled diabetes, obesity, tobacco abuse. She initially presented in 05/2015 with a non-STEMI. EF was 45% that time. She had high-grade mid to distal LAD stenosis which was treated with overlapping DES 2. She has had several heart catheterizations since. 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 She was admitted shortly after initial PCI in 5/17 with recurrent chest symptoms. Inpatient nuclear stress test was nonischemic. Cardiac catheterization in 03/2016 demonstrated patent LAD stents. However, she did have a distal LAD stenosis which was treated with a DES. Echocardiogram in 03/2016 demonstrated normal LV function with moderate diastolic dysfunction and trivial AI. Cardiac Catheterization in 5/18 demonstrated patent stents in the LAD. She had mild to moderate nonobstructive disease in the LCx and RCA. Medical therapy was continued. She has continued to have multiple complaints after multiple Cardiac Catheterizations.  She has had chronic R wrist and R groin pain after her procedures without clear etiology.  She did have an abnormal CT that showed 13 mm uterine cystic abnormality and was referred to GYN.  She has also remained short of breath normal BNP and normal VQ scan.  At last visit with Dr. Tamala Julian, her diuretic was increased.    Ms. Schlotterbeck returns for post hospital follow up.  Unfortunately, I had no records at the beginning of her visit. The patient does bring in a copy of her discharge instructions that does  have lab results information. She is here alone.  She was evaluated in the ED at [facility] approximately 2 weeks ago.  She tells me she passed out at home without warning.  She tells me that she woke up in the ED with chest pain.  She tells me that she did not take nitroglycerin at home. It is not clear how long she was unconscious.  She notes chronic chest pain.  She has chest pain with any activity.  She notes symptoms for several months.  She now has pain radiating up into her neck.  She does note discomfort with certain movements of her neck as well. She has chronic dyspnea. This is with any activity. This is fairly chronic. She describes NYHA 3 symptoms. She sleeps on 6 pillows. She admits to PND. She denies significant edema. She no longer smokes cigarettes. She denies any bleeding issues. She's had nausea and vomiting with certain meals. Of note, she has seen gastroenterology in the past. She tells me she was discharged with a prescription for Nitropaste to use at home.  I did receive records prior to the patient leaving the office today. Discharge summary does not mention any episode of syncope. It does mention that the patient took nitroglycerin 3 at home without relief of chest pain. Labs: Creatinine 1.13, ALT 20, potassium 4.1, troponin T negative 3, Hgb 12.6, WBC 12.1. Chest x-ray: No acute abnormality. ECG 09/16/16: NSR, HR 67, normal axis, no ST-T wave changes, QTc 458  Prior CV studies:   The following studies were reviewed today:  VQ  Scan 6/18 IMPRESSION: Normal examination.  No evidence of pulmonary embolism.  LHC 07/17/16 LAD proximal 30, mid stents patent, distal stent patent LCx mid 50, OM1 35 RCA mid 25 EF 55-65  Event Monitor 2/18 NSR, no arrhythmias  Echo 03/2016 Mild LVH, EF 55-60, normal wall motion, grade 2 diastolic dysfunction, trivial AI   LHC 03/2016 PCI: 3 x 24 mm Promus premier DES to the distal LAD   Echo 06/2015 EF 55-60, apical septal hypokinesis, normal  diastolic function, mild to moderate MR, mild LAE, mild to moderate TR, trivial pericardial effusion   Myoview 07/24/15 No ischemia, EF 57   LHC 05/2015 Mid LAD stents patent LCx mid 75 RCA mid 40   LHC 05/2015 LAD mid 16, 99 LCx mid 75 RCA mid 29 EF 35-45 PCI: 3 x 20 mm Promus premier DES and 3.5 x 32 mm Promus premier DES (overlapping) to the mid LAD   Echo 05/2015 EF 45, grade 1 diastolic dysfunction, apical septal and apical hypokinesis, anteroseptal and apical lateral hypokinesis, mild MR, mild TR   Past Medical History:  Diagnosis Date  . CAD (coronary artery disease)    a. LHC on 06/21/15 which showed 75% occl mid-dist LCx, 40% occl mRCA, 99% mid-dist LAD s/p DES and 50% occl mLAD s/p DES (overlapping stents) and significant LV dysfunction with anteroapical HK; b. 06/2015 Myoview: EF 55-65%, no ischemia/infarct.  . CHF (congestive heart failure) ([place])   . GERD (gastroesophageal reflux disease)   . Hyperlipidemia   . Hypertension   . Hypothyroidism   . Myocardial stunning (HCC)    a. EF 45% on 2D ECHO on 06/21/15 and severe LV dysfunction on LV gram by cath. Repeat 2D ECHO with EF 55-60% and mild HK of the apicoseptal myocardium.  [name] Neuropraxia of right median nerve    a. suspected after right radial cath. will follow with neuro outpatient if does not resolve.   . NSTEMI (non-ST elevated myocardial infarction) [facility]) 05/2015   [facility]  . Obesity   . 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 05/2015 Following PCI (radial access)-->conservatively managed.  . Tobacco abuse   . Tricuspid regurgitation   . Type II diabetes mellitus ([place])    a. Type II  . Vitamin D deficiency     Past Surgical History:  Procedure Laterality Date  . [place] STUDY N/A 07/14/2016   Procedure: [place] STUDY;  Surgeon: Mauri Pole, MD;  Location: [facility] ENDOSCOPY;  Service: Endoscopy;  Laterality: N/A;  . CARDIAC CATHETERIZATION N/A 06/21/2015   Procedure: Left Heart Cath and Coronary  Angiography;  Surgeon: Belva Crome, MD; pLAD 99%, CFX 75%, RCA 40%, EF 35%   . CARDIAC CATHETERIZATION N/A 06/21/2015   Procedure: Coronary Stent Intervention;  Surgeon: Belva Crome, MD;  Promus Premier DES, 3.0 x 20 and 3.5 x 32 mm stents to the LAD  . CARDIAC CATHETERIZATION N/A 06/21/2015   Procedure: Intravascular Ultrasound/IVUS;  Surgeon: Belva Crome, MD;  Location: [place] CV LAB;  Service: Cardiovascular;  Laterality: N/A;  LAD  . CARDIAC CATHETERIZATION  06/22/2015   Procedure: Coronary/Graft Angiography;  Surgeon: Peter M Martinique, MD;  Location: [place] CV LAB;  Service: Cardiovascular;;  . CORONARY STENT INTERVENTION N/A 04/02/2016   Procedure: Coronary Stent Intervention;  Surgeon: Jettie Booze, MD;  Location: [place] CV LAB;  Service: Cardiovascular;  Laterality: N/A;  . ESOPHAGEAL MANOMETRY N/A 07/14/2016   Procedure: ESOPHAGEAL MANOMETRY (EM);  Surgeon:  Mauri Pole, MD;  Location: [facility];  Service: Endoscopy;  Laterality: N/A;  . LEFT HEART CATH AND CORONARY ANGIOGRAPHY N/A 04/02/2016   Procedure: Left Heart Cath and Coronary Angiography;  Surgeon: Jettie Booze, MD;  Location: [place] CV LAB;  Service: Cardiovascular;  Laterality: N/A;  . LEFT HEART CATH AND CORONARY ANGIOGRAPHY N/A 07/17/2016   Procedure: Left Heart Cath and Coronary Angiography;  Surgeon: Leonie Man, MD;  Location: [place] CV LAB;  Service: Cardiovascular;  Laterality: N/A;    Current Medications: Current Meds  Medication Sig  . acetaminophen (TYLENOL) 500 MG tablet Take 500 mg by mouth every 6 (six) hours as needed for headache (pain).  [name] aspirin 81 MG EC tablet Take 1 tablet (81 mg total) by mouth daily.  [name] atorvastatin (LIPITOR) 40 MG tablet Take 1 tablet (40 mg total) by mouth daily at 6 PM.  . Cholecalciferol (VITAMIN D) 2000 units tablet Take 1 tablet (2,000 Units total) by mouth daily.  . clopidogrel (PLAVIX) 75 MG tablet Take 75 mg by mouth 2 (two)  times daily.  . ferrous gluconate (FERGON) 324 MG tablet Take 1 tablet (324 mg total) by mouth daily with breakfast.  . fluticasone furoate-vilanterol (BREO ELLIPTA) 200-25 MCG/INH AEPB Inhale 1 puff into the lungs daily.  [name] gabapentin (NEURONTIN) 300 MG capsule Take 300 mg by mouth 2 (two) times daily.  . Insulin Glargine (LANTUS SOLOSTAR) 100 UNIT/ML Solostar Pen Inject 10 Units into the skin daily at 10 pm.  . linaclotide (LINZESS) 145 MCG CAPS capsule Take 1 capsule (145 mcg total) by mouth daily before breakfast.  . lisinopril-hydrochlorothiazide (PRINZIDE,ZESTORETIC) 20-25 MG tablet Take 1 tablet by mouth daily.  . metFORMIN (GLUCOPHAGE) 1000 MG tablet Take 1 tablet (1,000 mg total) by mouth 2 (two) times daily with a meal.  . nicotine (NICODERM CQ - DOSED IN MG/24 HOURS) 21 mg/24hr patch Place 1 patch (21 mg total) onto the skin daily.  . nitroGLYCERIN (NITROSTAT) 0.4 MG SL tablet Place 1 tablet (0.4 mg total) under the tongue every 5 (five) minutes as needed for chest pain.  [name] ondansetron (ZOFRAN) 4 MG tablet Take 4 mg by mouth every 6 (six) hours as needed for nausea. for nausea  . pantoprazole (PROTONIX) 40 MG tablet Take 40 mg by mouth 2 (two) times daily.  . promethazine (PHENERGAN) 12.5 MG tablet Take 1 tablet (12.5 mg total) by mouth every 6 (six) hours as needed for nausea or vomiting.  . [DISCONTINUED] metoprolol (LOPRESSOR) 50 MG tablet Take 1 tablet (50 mg total) by mouth 2 (two) times daily.     Allergies:   Patient has no known allergies.   Social History   Social History  . Marital status: Single    Spouse name: N/A  . Number of children: 4  . Years of education: 12   Occupational History  . [employer]   Social History Main Topics  . Smoking status: Former Smoker    Packs/day: 0.50    Years: 23.00    Types: Cigarettes    Quit date: 04/01/2016  . Smokeless tobacco: Never Used  . Alcohol use No  . Drug use: No  . Sexual activity: Not Currently    Partners:  Male   Other Topics Concern  . None   Social History Narrative   Lives in [place], works nights at [employer], [place]   Right-handed   Drinks 1 soda a day   Exercises some with walking  Lives with her 36 yo son   Likes her space, doesn't want a significant other   03/2016     Family Hx: The patient's family history includes Diabetes in her maternal aunt, maternal grandmother, and mother; Healthy in her brother, father, and sister; Heart disease in her maternal grandmother. There is no history of Heart attack, Heart failure, Stroke, Hyperlipidemia, Hypertension, Stomach cancer, or Colon cancer.  ROS:   Please see the history of present illness.    Review of Systems  Cardiovascular: Positive for chest pain and dyspnea on exertion.  Respiratory: Positive for shortness of breath.   Hematologic/Lymphatic: Bruises/bleeds easily.  Gastrointestinal: Positive for nausea and vomiting.  Neurological: Positive for dizziness.   All other systems reviewed and are negative.   EKGs/Labs/Other Test Reviewed:    EKG:  EKG is  ordered today.  The ekg ordered today demonstrates NSR, HR 98, normal axis, no ST-T wave changes, QTc 462, no change from prior tracing  Recent Labs: 01/25/2016: ALT 10 04/15/2016: TSH 0.56 07/29/2016: NT-Pro BNP 19 08/15/2016: BUN 8; Creatinine, Ser 0.84; Hemoglobin 12.8; Platelets 300; Potassium 4.3; Sodium 141   Recent Lipid Panel Lab Results  Component Value Date/Time   CHOL 140 01/25/2016 10:15 AM   TRIG 63 01/25/2016 10:15 AM   HDL 43 (L) 01/25/2016 10:15 AM   CHOLHDL 3.3 01/25/2016 10:15 AM   LDLCALC 84 01/25/2016 10:15 AM    Physical Exam:    VS:  BP (!) 158/88   Pulse 98   Ht 5\' 9"  (1.753 m)   Wt 239 lb (108.4 kg)   SpO2 99%   BMI 35.29 kg/m     Wt Readings from Last 3 Encounters:  09/29/16 239 lb (108.4 kg)  09/03/16 239 lb (108.4 kg)  09/02/16 240 lb (108.9 kg)     Physical Exam  Constitutional: She is oriented to person, place, and time. She  appears well-developed and well-nourished. No distress.  HENT:  Head: Normocephalic and atraumatic.  Eyes: No scleral icterus.  Neck: No JVD present.  Cardiovascular: Normal rate and regular rhythm.   No murmur heard. Pulmonary/Chest: She has no wheezes. She has no rales.  Abdominal: Soft. There is no tenderness.  Musculoskeletal: She exhibits no edema.  Neurological: She is alert and oriented to person, place, and time.  Skin: Skin is warm and dry.  Psychiatric: She has a normal mood and affect.    ASSESSMENT:    1. Syncope, unspecified syncope type   2. Coronary artery disease involving native coronary artery of native heart with angina pectoris ([place])   3. Chronic diastolic heart failure (HCC)   4. Other hyperlipidemia   5. Gastroesophageal reflux disease, esophagitis presence not specified    PLAN:    In order of problems listed above:  1. Syncope, unspecified syncope type Etiology not clear.  She describes losing consciousness without warning and waking up in the ED. She states her family could not arouse her but she seemed to be wincing in pain and grabbing her chest.  The ED/hospital notes do not indicate that she passed out and no Head CT was done.  She sees neurology soon for evaluation of R leg and R wrist pain post cath to r/o neuropraxia.  Her description of her syncope does not sound cardiac.  Question if neuro workup needs to be considered.  -  I will send a message to the neurologist that she sees and ask her to evaluate syncope  -  I will d/w Dr.  Tamala Julian to see if we should have her see EP to get a Loop recorder.  -  No driving for 6 months.   2. Coronary artery disease involving native coronary artery of native heart with angina pectoris [facility]) -  s/p DES x 2 to the LAD in 05/2015 and DES to distal LAD in 03/2016.  Cardiac catheterization in 5/18 with patent stents and mild to moderate nonobstructive disease elsewhere.   She continues to complain of chronic chest pain.   The pattern has changed somewhat and she now has pain in her jaw.  Her ECG is unchanged.    -  Continue aspirin, Plavix, statin, ACE inhibitor, nitrates  -  DC Nitropaste given to her by the emergency room  -  Increase metoprolol to 100 mg twice a day  -  Arrange Lexiscan Myoview  -  Keep follow-up with Dr. Tamala Julian next week  3. Chronic diastolic heart failure (HCC) - Volume appears stable. Continue current therapy.  4. Other hyperlipidemia - Continue statin.  5. Gastroesophageal reflux disease, esophagitis presence not specified Continue PPI and follow-up with GI as indicated.   Dispo:  Return in about 7 days (around 10/06/2016) for Scheduled Follow Up with Dr. Tamala Julian.   Medication Adjustments/Labs and Tests Ordered: Current medicines are reviewed at length with the patient today.  Concerns regarding medicines are outlined above.  Tests Ordered: Orders Placed This Encounter  Procedures  . Myocardial Perfusion Imaging  . EKG 12-Lead   Medication Changes: Meds ordered this encounter  Medications  . isosorbide mononitrate (IMDUR) 30 MG 24 hr tablet    Sig: Take 1 tablet (30 mg total) by mouth daily.    Dispense:  90 tablet    Refill:  3  . metoprolol tartrate (LOPRESSOR) 100 MG tablet    Sig: Take 1 tablet (100 mg total) by mouth 2 (two) times daily.    Dispense:  180 tablet    Refill:  3    Signed, Richardson Dopp, PA-C  09/29/2016 5:10 PM    [facility] [place], [place], [place]  [address] Phone: [phone]; Fax: [phone]

## 2016-09-29 ENCOUNTER — Encounter: Payer: Self-pay | Admitting: Physician Assistant

## 2016-09-29 ENCOUNTER — Telehealth (HOSPITAL_COMMUNITY): Payer: Self-pay | Admitting: *Deleted

## 2016-09-29 ENCOUNTER — Ambulatory Visit (INDEPENDENT_AMBULATORY_CARE_PROVIDER_SITE_OTHER): Payer: 59 | Admitting: Physician Assistant

## 2016-09-29 VITALS — BP 158/88 | HR 98 | Ht 69.0 in | Wt 239.0 lb

## 2016-09-29 DIAGNOSIS — R55 Syncope and collapse: Secondary | ICD-10-CM | POA: Diagnosis not present

## 2016-09-29 DIAGNOSIS — I25119 Atherosclerotic heart disease of native coronary artery with unspecified angina pectoris: Secondary | ICD-10-CM | POA: Diagnosis not present

## 2016-09-29 DIAGNOSIS — E784 Other hyperlipidemia: Secondary | ICD-10-CM | POA: Diagnosis not present

## 2016-09-29 DIAGNOSIS — K219 Gastro-esophageal reflux disease without esophagitis: Secondary | ICD-10-CM | POA: Diagnosis not present

## 2016-09-29 DIAGNOSIS — I5032 Chronic diastolic (congestive) heart failure: Secondary | ICD-10-CM | POA: Diagnosis not present

## 2016-09-29 DIAGNOSIS — E7849 Other hyperlipidemia: Secondary | ICD-10-CM

## 2016-09-29 MED ORDER — ISOSORBIDE MONONITRATE ER 30 MG PO TB24: 30.0000 mg | ORAL_TABLET | Freq: Every day | ORAL | 3 refills | Status: DC

## 2016-09-29 MED ORDER — METOPROLOL TARTRATE 100 MG PO TABS: 100.0000 mg | ORAL_TABLET | Freq: Two times a day (BID) | ORAL | 3 refills | Status: DC

## 2016-09-29 MED FILL — METOPROLOL TARTRATE 100 MG: 100 | 90 days supply | Qty: 180 | Fill #0

## 2016-09-29 NOTE — Patient Instructions (Addendum)
Medication Instructions:  1. STOP taking the Nitropaste.  2. INCREASE Metoprolol to 100 mg Twice daily   Labwork: None   Testing/Procedures: Schedule a Nuclear stress test (Lexiscan Myoview) PER Loetta Connelley, PAC THIS NEEDS TO BE DONE BEFORE APPT WITH DR. Tamala Julian ON 10/06/16 Your physician has requested that you have a lexiscan myoview. For further information please visit [email or web address]. Please follow instruction sheet, as given.  Follow-Up: Dr. Daneen Schick Monday 10/06/16 as planned.   Any Other Special Instructions Will Be Listed Below (If Applicable). You should not drive for 6 months after passing out.  If you need a refill on your cardiac medications before your next appointment, please call your pharmacy.

## 2016-09-29 NOTE — Progress Notes (Signed)
Scott, I remain puzzled by this patient. Please have neurology to offer suggestions about etiology of syncope. The nuclear study will likely be abnormal but we should not repeat heart catheterization on her unless absolutely necessary. Every time she has invasive procedures, additional problems and complaints develop. She needs to have ACS with enzyme or EKG evidence of ongoing ischemia before undergoing repeat study.

## 2016-09-29 NOTE — Telephone Encounter (Signed)
Patient given detailed instructions per Myocardial Perfusion Study Information Sheet for the test on 10/01/16 at 12:45. Patient notified to arrive 15 minutes early and that it is imperative to arrive on time for appointment to keep from having the test rescheduled.  If you need to cancel or reschedule your appointment, please call the office within 24 hours of your appointment. . Patient verbalized understanding.Jennifer Cervantes

## 2016-09-30 ENCOUNTER — Telehealth: Payer: Self-pay | Admitting: *Deleted

## 2016-09-30 NOTE — Telephone Encounter (Signed)
Called pt this morning and advised per Dr. Tamala Julian hold off on Myoview at this time and he will f/u with her at appt 10/06/16. Pt thanked me for my call to let her know we cancelled Myoview. Pt is agreeable to plan of care.

## 2016-09-30 NOTE — Progress Notes (Signed)
I reviewed her case with Dr. Daneen Schick, her primary cardiologist. After further consideration, we have decided to forego stress testing for now. Therefore, the Nuclear stress test will be DC'd. The patient will be called and notified. She will follow up with Dr. Tamala Julian as planned. Richardson Dopp, PA-C    09/30/2016 11:38 AM

## 2016-10-01 ENCOUNTER — Encounter (HOSPITAL_COMMUNITY): Payer: 59

## 2016-10-01 ENCOUNTER — Ambulatory Visit (HOSPITAL_COMMUNITY): Payer: 59

## 2016-10-02 ENCOUNTER — Ambulatory Visit (HOSPITAL_COMMUNITY): Payer: 59

## 2016-10-06 ENCOUNTER — Encounter: Payer: Self-pay | Admitting: Interventional Cardiology

## 2016-10-06 ENCOUNTER — Ambulatory Visit (INDEPENDENT_AMBULATORY_CARE_PROVIDER_SITE_OTHER): Payer: 59 | Admitting: Interventional Cardiology

## 2016-10-06 VITALS — BP 132/86 | HR 121 | Ht 69.0 in | Wt 235.4 lb

## 2016-10-06 DIAGNOSIS — R55 Syncope and collapse: Secondary | ICD-10-CM

## 2016-10-06 DIAGNOSIS — E7849 Other hyperlipidemia: Secondary | ICD-10-CM

## 2016-10-06 DIAGNOSIS — E784 Other hyperlipidemia: Secondary | ICD-10-CM | POA: Diagnosis not present

## 2016-10-06 DIAGNOSIS — I25119 Atherosclerotic heart disease of native coronary artery with unspecified angina pectoris: Secondary | ICD-10-CM | POA: Diagnosis not present

## 2016-10-06 DIAGNOSIS — R1031 Right lower quadrant pain: Secondary | ICD-10-CM

## 2016-10-06 DIAGNOSIS — E118 Type 2 diabetes mellitus with unspecified complications: Secondary | ICD-10-CM | POA: Diagnosis not present

## 2016-10-06 DIAGNOSIS — I5032 Chronic diastolic (congestive) heart failure: Secondary | ICD-10-CM

## 2016-10-06 DIAGNOSIS — S5411XS Injury of median nerve at forearm level, right arm, sequela: Secondary | ICD-10-CM | POA: Diagnosis not present

## 2016-10-06 MED ORDER — LISINOPRIL-HYDROCHLOROTHIAZIDE 20-25 MG PO TABS: 0.5000 | ORAL_TABLET | Freq: Every day | ORAL | 3 refills | Status: DC

## 2016-10-06 NOTE — Patient Instructions (Addendum)
Medication Instructions:  1) STOP Isosorbide 2) DECREASE Lisinopril-HCTZ 20/25 to a half tablet once daily  Labwork: None  Testing/Procedures: None  Follow-Up: Your physician recommends that you schedule a follow-up appointment in: 4 months with Dr. Tamala Julian.    Any Other Special Instructions Will Be Listed Below (If Applicable).     If you need a refill on your cardiac medications before your next appointment, please call your pharmacy.

## 2016-10-06 NOTE — Progress Notes (Addendum)
Cardiology Office Note    Date:  10/06/2016   ID:  Jennifer Cervantes, DOB 05-14-1980, MRN [id number]  PCP:  Carlena Hurl, PA-C  Cardiologist: Sinclair Grooms, MD   Chief Complaint  Patient presents with  . Coronary Artery Disease  . Groin Pain    Post-cath  . Loss of Consciousness    History of Present Illness:  Jennifer Cervantes is a 36 y.o. female with a hx of CAD s/p multiple PCI procedures, uncontrolled diabetes, obesity, tobacco abuse. She initially presented in 05/2015 with a non-STEMI. EF was 45% that time. She had high-grade mid to distal LAD stenosis which was treated with overlapping DES 2. She has had several heart catheterizations since. 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 She was admitted shortly after initial PCI in 5/17 with recurrent chest symptoms. Inpatient nuclear stress test was nonischemic. Cardiac catheterization in 03/2016 demonstrated patent LAD stents. However, she did have a distal LAD stenosis which was treated with a DES. Echocardiogram in 03/2016 demonstrated normal LV function with moderate diastolic dysfunction and trivial AI. Cardiac Catheterization in 5/18 demonstrated patent stents in the LAD. She had mild to moderate nonobstructive disease in the LCx and RCA. Within the past 2 months she has had several episodes of loss of consciousness though poorly characterized. -30 day monitor was performed between February 27 and 05/21/2016. No arrhythmia to explain syncope was identified.  Today she complains of worsening discomfort in the right inguinal area and relates this to the cardiac catheterization performed in May. She says the groin feels as though there is swelling. Multiple complaints after the procedure lead to venous Doppler and also pelvic CT with contrast to rule out retroperitoneal hematoma and evidence of pseudoaneurysm. No significant abnormalities  were found.  Past Medical History:  Diagnosis Date  . CAD (coronary artery disease)    a. LHC on 06/21/15 which showed 75% occl mid-dist LCx, 40% occl mRCA, 99% mid-dist LAD s/p DES and 50% occl mLAD s/p DES (overlapping stents) and significant LV dysfunction with anteroapical HK; b. 06/2015 Myoview: EF 55-65%, no ischemia/infarct.  . CHF (congestive heart failure) ([place])   . GERD (gastroesophageal reflux disease)   . Hyperlipidemia   . Hypertension   . Hypothyroidism   . Myocardial stunning (HCC)    a. EF 45% on 2D ECHO on 06/21/15 and severe LV dysfunction on LV gram by cath. Repeat 2D ECHO with EF 55-60% and mild HK of the apicoseptal myocardium.  [name] Neuropraxia of right median nerve    a. suspected after right radial cath. will follow with neuro outpatient if does not resolve.   . NSTEMI (non-ST elevated myocardial infarction) [facility]) 05/2015   [facility]  . Obesity   . 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 05/2015 Following PCI (radial access)-->conservatively managed.  . Tobacco abuse   . Tricuspid regurgitation   . Type II diabetes mellitus ([place])    a. Type II  . Vitamin D deficiency     Past Surgical History:  Procedure Laterality Date  . [place] STUDY N/A 07/14/2016   Procedure: [place] STUDY;  Surgeon: Mauri Pole, MD;  Location: [facility];  Service: Endoscopy;  Laterality: N/A;  . CARDIAC CATHETERIZATION N/A 06/21/2015   Procedure: Left Heart Cath and Coronary Angiography;  Surgeon: Belva Crome, MD; pLAD 99%, CFX 75%, RCA 40%, EF 35%   .  CARDIAC CATHETERIZATION N/A 06/21/2015   Procedure: Coronary Stent Intervention;  Surgeon: Belva Crome, MD;  Promus Premier DES, 3.0 x 20 and 3.5 x 32 mm stents to the LAD  . CARDIAC CATHETERIZATION N/A 06/21/2015   Procedure: Intravascular Ultrasound/IVUS;  Surgeon: Belva Crome, MD;  Location: [place] [facility];  Service: Cardiovascular;  Laterality: N/A;  LAD  . CARDIAC CATHETERIZATION  06/22/2015   Procedure:  Coronary/Graft Angiography;  Surgeon: Peter M Martinique, MD;  Location: [place] [facility];  Service: Cardiovascular;;  . CORONARY STENT INTERVENTION N/A 04/02/2016   Procedure: Coronary Stent Intervention;  Surgeon: Jettie Booze, MD;  Location: [place] [facility];  Service: Cardiovascular;  Laterality: N/A;  . ESOPHAGEAL MANOMETRY N/A 07/14/2016   Procedure: ESOPHAGEAL MANOMETRY (EM);  Surgeon: Mauri Pole, MD;  Location: [facility];  Service: Endoscopy;  Laterality: N/A;  . LEFT HEART CATH AND CORONARY ANGIOGRAPHY N/A 04/02/2016   Procedure: Left Heart Cath and Coronary Angiography;  Surgeon: Jettie Booze, MD;  Location: [place] [facility];  Service: Cardiovascular;  Laterality: N/A;  . LEFT HEART CATH AND CORONARY ANGIOGRAPHY N/A 07/17/2016   Procedure: Left Heart Cath and Coronary Angiography;  Surgeon: Leonie Man, MD;  Location: [place] [facility];  Service: Cardiovascular;  Laterality: N/A;    Current Medications: Outpatient Medications Prior to Visit  Medication Sig Dispense Refill  . acetaminophen (TYLENOL) 500 MG tablet Take 500 mg by mouth every 6 (six) hours as needed for headache (pain).    [name] aspirin 81 MG EC tablet Take 1 tablet (81 mg total) by mouth daily. 90 tablet 3  . atorvastatin (LIPITOR) 40 MG tablet Take 1 tablet (40 mg total) by mouth daily at 6 PM. 90 tablet 3  . Cholecalciferol (VITAMIN D) 2000 units tablet Take 1 tablet (2,000 Units total) by mouth daily. 90 tablet 3  . clopidogrel (PLAVIX) 75 MG tablet Take 75 mg by mouth 2 (two) times daily.    . ferrous gluconate (FERGON) 324 MG tablet Take 1 tablet (324 mg total) by mouth daily with breakfast. 90 tablet 1  . fluticasone furoate-vilanterol (BREO ELLIPTA) 200-25 MCG/INH AEPB Inhale 1 puff into the lungs daily. 28 each 0  . gabapentin (NEURONTIN) 300 MG capsule Take 300 mg by mouth 2 (two) times daily.    . Insulin Glargine (LANTUS SOLOSTAR) 100 UNIT/ML Solostar Pen Inject 10 Units into the skin  daily at 10 pm. 5 pen 5  . linaclotide (LINZESS) 145 MCG CAPS capsule Take 1 capsule (145 mcg total) by mouth daily before breakfast. 90 capsule 1  . metFORMIN (GLUCOPHAGE) 1000 MG tablet Take 1 tablet (1,000 mg total) by mouth 2 (two) times daily with a meal. 180 tablet 3  . metoprolol tartrate (LOPRESSOR) 100 MG tablet Take 1 tablet (100 mg total) by mouth 2 (two) times daily. 180 tablet 3  . nicotine (NICODERM CQ - DOSED IN MG/24 HOURS) 21 mg/24hr patch Place 1 patch (21 mg total) onto the skin daily. 28 patch 0  . nitroGLYCERIN (NITROSTAT) 0.4 MG SL tablet Place 1 tablet (0.4 mg total) under the tongue every 5 (five) minutes as needed for chest pain. 25 tablet 3  . ondansetron (ZOFRAN) 4 MG tablet Take 4 mg by mouth every 6 (six) hours as needed for nausea. for nausea  0  . pantoprazole (PROTONIX) 40 MG tablet Take 40 mg by mouth 2 (two) times daily.    . promethazine (PHENERGAN) 12.5 MG tablet Take 1 tablet (  12.5 mg total) by mouth every 6 (six) hours as needed for nausea or vomiting. 15 tablet 0  . isosorbide mononitrate (IMDUR) 30 MG 24 hr tablet Take 1 tablet (30 mg total) by mouth daily. 90 tablet 3  . lisinopril-hydrochlorothiazide (PRINZIDE,ZESTORETIC) 20-25 MG tablet Take 1 tablet by mouth daily. 90 tablet 3   No facility-administered medications prior to visit.      Allergies:   Patient has no known allergies.   Social History   Social History  . Marital status: Single    Spouse name: N/A  . Number of children: 4  . Years of education: 12   Occupational History  . CNA [employer]   Social History Main Topics  . Smoking status: Former Smoker    Packs/day: 0.50    Years: 23.00    Types: Cigarettes    Quit date: 04/01/2016  . Smokeless tobacco: Never Used  . Alcohol use No  . Drug use: No  . Sexual activity: Not Currently    Partners: Male   Other Topics Concern  . None   Social History Narrative   Lives in [place], works nights at [employer], [place]   Right-handed    [place] 1 soda a day   Exercises some with walking   Lives with her 24 yo son   Likes her space, doesn't want a significant other   03/2016     Family History:  The patient's family history includes Diabetes in her maternal aunt, maternal grandmother, and mother; Healthy in her brother, father, and sister; Heart disease in her maternal grandmother.   ROS:   Please see the history of present illness.    Right groin discomfort-feels swollen. Right leg is weak. Right hand is weak. Chest is tight continuously. Short of breath all the time, worsened by physical activity. Recurring chest pain. Headaches. Unexplained fainting episodes that have not been witnessed by others. Unable to work. Easy bruising, dizziness noted to be worse ring or from sitting to standing. Right leg pain. All other systems reviewed and are negative.   PHYSICAL EXAM:   VS:  BP 132/86 (BP Location: Left Arm)   Pulse (!) 121   Ht 5\' 9"  (1.753 m)   Wt 235 lb 6.4 oz (106.8 kg)   BMI 34.76 kg/m    GEN: Well nourished, well developed, in no acute distress . Obese. HEENT: normal  Neck: no JVD, carotid bruits, or masses Cardiac: RRR; no murmurs, rubs, or gallops,no edema . No femoral area pulsatile mass or bruit is heard. Respiratory:  clear to auscultation bilaterally, normal work of breathing GI: soft, nontender, nondistended, + BS MS: no deformity or atrophy. Right inguinal area is tender. No masses noted.  Skin: warm and dry, no rash Neuro:  Alert and Oriented x 3, Strength and sensation are intact Psych: euthymic mood, full affect  Wt Readings from Last 3 Encounters:  10/06/16 235 lb 6.4 oz (106.8 kg)  09/29/16 239 lb (108.4 kg)  09/03/16 239 lb (108.4 kg)      Studies/Labs Reviewed:   EKG:  EKG  Not repeated.  Recent Labs: 01/25/2016: ALT 10 04/15/2016: TSH 0.56 07/29/2016: NT-Pro BNP 19 08/15/2016: BUN 8; Creatinine, Ser 0.84; Hemoglobin 12.8; Platelets 300; Potassium 4.3; Sodium 141   Lipid Panel      Component Value Date/Time   CHOL 140 01/25/2016 1015   TRIG 63 01/25/2016 1015   HDL 43 (L) 01/25/2016 1015   CHOLHDL 3.3 01/25/2016 1015   VLDL 13 01/25/2016  [place] 01/25/2016 1015    Additional studies/ records that were reviewed today include:   CT abdomen/pelvis 08/22/2016: IMPRESSION: Negative for right inguinal abnormality or visualized complication of recent cardiac catheterization. No retroperitoneal hematoma.  Hepatic steatosis  Aortoiliac atherosclerosis  13 mm uterine fundus round hypodense cyst like abnormality. Small early IUP could have this appearance. Recommend correlation with pregnancy test and consider pelvic ultrasound.  07/28/2016 Venous Doppler of right lower extremity [facility]): No evidence of DVT was noted  VQ Scan 6/18 IMPRESSION: Normal examination.  No evidence of pulmonary embolism.   LHC 07/17/16 LAD proximal 30, mid stents patent, distal stent patent LCx mid 50, OM1 35 RCA mid 25 EF 55-65   Event Monitor 2/18 NSR, no arrhythmias   Echo 03/2016 Mild LVH, EF 55-60, normal wall motion, grade 2 diastolic dysfunction, trivial AI   LHC 03/2016 PCI: 3 x 24 mm Promus premier DES to the distal LAD   Echo 06/2015 EF 55-60, apical septal hypokinesis, normal diastolic function, mild to moderate MR, mild LAE, mild to moderate TR, trivial pericardial effusion   Myoview 07/24/15 No ischemia, EF 57   LHC 05/2015 Mid LAD stents patent LCx mid 75 RCA mid 40   LHC 05/2015 LAD mid 61, 99 LCx mid 75 RCA mid 43 EF 35-45 PCI: 3 x 20 mm Promus premier DES and 3.5 x 32 mm Promus premier DES (overlapping) to the mid LAD   Echo 05/2015 EF 45, grade 1 diastolic dysfunction, apical septal and apical hypokinesis, anteroseptal and apical lateral hypokinesis, mild MR, mild TR     ASSESSMENT:    1. Coronary artery disease involving native coronary artery of native heart with angina pectoris ([place])   2. Chronic diastolic heart failure  ([place])   3. Neurapraxia of right median nerve, sequela   4. Right inguinal pain   5. Syncope, unspecified syncope type   6. Diabetes mellitus with complication (HCC)   7. Other hyperlipidemia      PLAN:  In order of problems listed above:  1. Coronary artery disease with overlapping LAD stents. Anginal complaints are relatively stable. Decided against myocardial perfusion imaging and repeat catheterization as I do not believe they would provide information that will change our current clinical management. Her symptoms seem out of proportion to demonstrated disease. Most importantly, each time a procedure is done additional complaints aris without explanation/cause.. 2. Dyspnea on exertion. Etiology is uncertain. LV function and LVEDP were normal at the time of the most recent cath in May 2018. Complaints of dyspnea could be related to physical deconditioning. She is not volume overloaded and does not have active decompensated diastolic heart failure. 3. Absent right radial pulse after catheterization and also " radial neuropraxia" of the right median nerve which developed as a sequela of the second radial angiogram in 2017.. 4. Complaining of right groin/inguinal region discomfort following coronary angiography via the right femoral approach performed in May 2018. States the area is swollen. There is no abnormality noted on exam. No bruit or pulsatile masses noted. A pelvic CT with IV contrast did not reveal evidence of retroperitoneal hematoma or femoral artery site aneurysm or hematoma. Venous Doppler exam does not demonstrate evidence of DVT. Etiology of this discomfort is mysterious and given the diagnosis of neuropraxia of the radial nerve, the question of femoral nerve neuropraxia is raised. Again symptoms in this region seem out of proportion to clinical findings. She is also walking with a limp and dragging the  right leg. This is also out of proportion to the clinical findings. She has been  referred back to neurology 5. Several poorly characterizes episodes of prolonged syncope/loss of consciousness have been described. A 30 day monitor has been performed. No arrhythmia identified. No etiology identified. Etiology of these episodes are obscure. Rule out the possibility of orthostasis. Discontinue long-acting nitroglycerin and decrease ACE/HCTZ dose by 50%. 6. Current control is unknown 7. Continue statin therapy  Plan no further invasive or noninvasive cardiac evaluation documented evidence of ongoing ischemia/ACS.[name] Neurology opinion concerning the right inguinal area discomfort that developed post cath (? neuropraxia). Likely referral to either [facility] or [facility] for opinion concerning her multiple complaints. Somatization / Munchhausen syndrome is a concern. Please note the multiple procedures done since April 2017.  Medication Adjustments/Labs and Tests Ordered: Current medicines are reviewed at length with the patient today.  Concerns regarding medicines are outlined above.  Medication changes, Labs and Tests ordered today are listed in the Patient Instructions below. Patient Instructions  Medication Instructions:  1) STOP Isosorbide 2) DECREASE Lisinopril-HCTZ 20/25 to a half tablet once daily  Labwork: None  Testing/Procedures: None  Follow-Up: Your physician recommends that you schedule a follow-up appointment in: 4 months with Dr. Tamala Julian.    Any Other Special Instructions Will Be Listed Below (If Applicable).     If you need a refill on your cardiac medications before your next appointment, please call your pharmacy.      Signed, Sinclair Grooms, MD  10/06/2016 5:48 PM    [facility] [address], [address], [place]  [address] Phone: [phone]; Fax: [phone]

## 2016-10-09 ENCOUNTER — Telehealth: Payer: Self-pay | Admitting: Neurology

## 2016-10-09 NOTE — Telephone Encounter (Signed)
[place], MD        Dr. Liana Gerold are seeing this patient 8/20 for a consult. She is being referred for continued wrist and groin pain after separate Cardiac Catheterizations. Dr. Tamala Julian has referred her for possible neuropathic pain related to the cath. She was seen by me today and described an episode of syncope that does not sound cardiogenic. Can you also evaluate her symptoms from her ER visit to [facility] in July and evaluate further if felt to be necessary? We do not think she needs further cardiac testing for her syncope.  Please feel free to call/send a message.  Thank you!  Richardson Dopp, PA-C   09/29/2016 8:31 PM

## 2016-10-13 ENCOUNTER — Encounter: Payer: Self-pay | Admitting: Neurology

## 2016-10-13 ENCOUNTER — Ambulatory Visit (INDEPENDENT_AMBULATORY_CARE_PROVIDER_SITE_OTHER): Payer: 59 | Admitting: Neurology

## 2016-10-13 VITALS — BP 105/81 | HR 99 | Ht 69.0 in | Wt 232.0 lb

## 2016-10-13 DIAGNOSIS — R531 Weakness: Secondary | ICD-10-CM | POA: Diagnosis not present

## 2016-10-13 DIAGNOSIS — R269 Unspecified abnormalities of gait and mobility: Secondary | ICD-10-CM

## 2016-10-13 MED ORDER — GABAPENTIN 300 MG PO CAPS: 300.0000 mg | ORAL_CAPSULE | Freq: Three times a day (TID) | ORAL | 11 refills | Status: DC

## 2016-10-13 MED FILL — GABAPENTIN 300 MG CAPSULE: 300 | 30 days supply | Qty: 90 | Fill #0

## 2016-10-13 NOTE — Progress Notes (Signed)
PATIENT: Jennifer Cervantes DOB: 25-Jun-1980  Chief Complaint  Patient presents with  . Pain    Reports pain in right leg and groin that started after having a cardiac catherization on 07/17/16.  . Cardiology    Belva Crome, MD - referring MD  . PCP    Tysinger, Camelia Eng, PA-C     HISTORICAL  Jennifer Cervantes is a 36 years old right-handed female , seen in refer by  Cardiologist Dr. Daneen Schick for evaluation of right leg and groin pain, initial evaluation was October 13 2016.  I reviewed and summarized referring note, she had a history of diabetes, was admitted to the hospital in April 2017 complains of chest pain, she underwent coronary artery catheterization through the right brachial artery access.  She did develop significant swelling of the right forearm and hand postprocedure, she also reported numbness, weakness of right hand, and discomfort, difficulty using her right hand, but she complained of ongoing paresthesias and subjective weakness of her right hand, was subsequently evaluated by my colleague Dr. Jannifer Franklin in May 2017, EMG nerve conduction study on August 29 2015 was reported normal, during the visit, Dr. Jannifer Franklin also documented that her right hand paresthesia and weakness has nearly completely cleared.  Patient is referred back by her cardiologist this time following her cardiac catheterization in May 2018 through right femoral artery, she complains of persistent discomfort and pain at right groin area  She had cardiac catheter on Jul 17 2016 by Dr. Glenetta Hew, there was minimum changes from post PCI angiographic findings from February 2018, no new lesions to explain her complains of chest pain, consider spasm versus not anginal chest pain   patient complains that since her recent cardiac catheter in May 2018, she had a persistent right groin area discomfort, tenderness upon deep palpitation, right leg felt numb, occasionally right leg give out underneath her,  She  already had Doppler study of right groin on Jul 17 2016, artery and veins were patent, there were noted multiple enlarged lympha nodes.  She also complains of passing out episode, she described lightheadedness when getting up from seated position, in early August 2018, when she got up using bathroom after prolonged sitting, she felt lightheadedness, dizzy, it can happen anytime of the day, but usually related to sudden positional change getting up quickly from seated position.  She reported to evaluation [facility] there was no significant abnormality found.  Lab, A1c 7.3, today patient also complains of persistent right arm and the hand paresthesia and weakness reported this has been ongoing since April 2017, despite chart has document that her symptoms has nearly completely resolved previously.  REVIEW OF SYSTEMS: Full 14 system review of systems performed and notable only for chest pain, shortness of breath, easy bruising feeling cold, numbness, weakness, dizziness  ALLERGIES: No Known Allergies  HOME MEDICATIONS: Current Outpatient Prescriptions  Medication Sig Dispense Refill  . acetaminophen (TYLENOL) 500 MG tablet Take 500 mg by mouth every 6 (six) hours as needed for headache (pain).    [name] aspirin 81 MG EC tablet Take 1 tablet (81 mg total) by mouth daily. 90 tablet 3  . atorvastatin (LIPITOR) 40 MG tablet Take 1 tablet (40 mg total) by mouth daily at 6 PM. 90 tablet 3  . Cholecalciferol (VITAMIN D) 2000 units tablet Take 1 tablet (2,000 Units total) by mouth daily. 90 tablet 3  . clopidogrel (PLAVIX) 75 MG tablet Take 75 mg by mouth 2 (  two) times daily.    . ferrous gluconate (FERGON) 324 MG tablet Take 1 tablet (324 mg total) by mouth daily with breakfast. 90 tablet 1  . fluticasone furoate-vilanterol (BREO ELLIPTA) 200-25 MCG/INH AEPB Inhale 1 puff into the lungs daily. 28 each 0  . gabapentin (NEURONTIN) 300 MG capsule Take 300 mg by mouth 2 (two) times daily.    .  Insulin Glargine (LANTUS SOLOSTAR) 100 UNIT/ML Solostar Pen Inject 10 Units into the skin daily at 10 pm. 5 pen 5  . linaclotide (LINZESS) 145 MCG CAPS capsule Take 1 capsule (145 mcg total) by mouth daily before breakfast. 90 capsule 1  . lisinopril-hydrochlorothiazide (PRINZIDE,ZESTORETIC) 20-25 MG tablet Take 0.5 tablets by mouth daily. 45 tablet 3  . metFORMIN (GLUCOPHAGE) 1000 MG tablet Take 1 tablet (1,000 mg total) by mouth 2 (two) times daily with a meal. 180 tablet 3  . metoprolol tartrate (LOPRESSOR) 100 MG tablet Take 1 tablet (100 mg total) by mouth 2 (two) times daily. 180 tablet 3  . nicotine (NICODERM CQ - DOSED IN MG/24 HOURS) 21 mg/24hr patch Place 1 patch (21 mg total) onto the skin daily. 28 patch 0  . nitroGLYCERIN (NITROSTAT) 0.4 MG SL tablet Place 1 tablet (0.4 mg total) under the tongue every 5 (five) minutes as needed for chest pain. 25 tablet 3  . ondansetron (ZOFRAN) 4 MG tablet Take 4 mg by mouth every 6 (six) hours as needed for nausea. for nausea  0  . pantoprazole (PROTONIX) 40 MG tablet Take 40 mg by mouth 2 (two) times daily.    . promethazine (PHENERGAN) 12.5 MG tablet Take 1 tablet (12.5 mg total) by mouth every 6 (six) hours as needed for nausea or vomiting. 15 tablet 0   No current facility-administered medications for this visit.     PAST MEDICAL HISTORY: Past Medical History:  Diagnosis Date  . CAD (coronary artery disease)    a. LHC on 06/21/15 which showed 75% occl mid-dist LCx, 40% occl mRCA, 99% mid-dist LAD s/p DES and 50% occl mLAD s/p DES (overlapping stents) and significant LV dysfunction with anteroapical HK; b. 06/2015 Myoview: EF 55-65%, no ischemia/infarct.  . CHF (congestive heart failure) ([place])   . GERD (gastroesophageal reflux disease)   . Hyperlipidemia   . Hypertension   . Hypothyroidism   . Myocardial stunning (HCC)    a. EF 45% on 2D ECHO on 06/21/15 and severe LV dysfunction on LV gram by cath. Repeat 2D ECHO with EF 55-60% and mild HK  of the apicoseptal myocardium.  [name] Neuropraxia of right median nerve    a. suspected after right radial cath. will follow with neuro outpatient if does not resolve.   . NSTEMI (non-ST elevated myocardial infarction) [facility]) 05/2015   [facility]  . Obesity   . 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 05/2015 Following PCI (radial access)-->conservatively managed.  . Tobacco abuse   . Tricuspid regurgitation   . Type II diabetes mellitus ([place])    a. Type II  . Vitamin D deficiency     PAST SURGICAL HISTORY: Past Surgical History:  Procedure Laterality Date  . [place] STUDY N/A 07/14/2016   Procedure: [place] STUDY;  Surgeon: Mauri Pole, MD;  Location: [facility];  Service: Endoscopy;  Laterality: N/A;  . CARDIAC CATHETERIZATION N/A 06/21/2015   Procedure: Left Heart Cath and Coronary Angiography;  Surgeon: Belva Crome, MD; pLAD 99%, CFX 75%, RCA 40%, EF 35%   .  CARDIAC CATHETERIZATION N/A 06/21/2015   Procedure: Coronary Stent Intervention;  Surgeon: Belva Crome, MD;  Promus Premier DES, 3.0 x 20 and 3.5 x 32 mm stents to the LAD  . CARDIAC CATHETERIZATION N/A 06/21/2015   Procedure: Intravascular Ultrasound/IVUS;  Surgeon: Belva Crome, MD;  Location: [place] [facility];  Service: Cardiovascular;  Laterality: N/A;  LAD  . CARDIAC CATHETERIZATION  06/22/2015   Procedure: Coronary/Graft Angiography;  Surgeon: Peter M Martinique, MD;  Location: [place] [facility];  Service: Cardiovascular;;  . CORONARY STENT INTERVENTION N/A 04/02/2016   Procedure: Coronary Stent Intervention;  Surgeon: Jettie Booze, MD;  Location: [place] [facility];  Service: Cardiovascular;  Laterality: N/A;  . ESOPHAGEAL MANOMETRY N/A 07/14/2016   Procedure: ESOPHAGEAL MANOMETRY (EM);  Surgeon: Mauri Pole, MD;  Location: [facility];  Service: Endoscopy;  Laterality: N/A;  . LEFT HEART CATH AND CORONARY ANGIOGRAPHY N/A 04/02/2016   Procedure: Left Heart Cath and Coronary Angiography;  Surgeon:  Jettie Booze, MD;  Location: [place] [facility];  Service: Cardiovascular;  Laterality: N/A;  . LEFT HEART CATH AND CORONARY ANGIOGRAPHY N/A 07/17/2016   Procedure: Left Heart Cath and Coronary Angiography;  Surgeon: Leonie Man, MD;  Location: [place] [facility];  Service: Cardiovascular;  Laterality: N/A;    FAMILY HISTORY: Family History  Problem Relation Age of Onset  . Diabetes Mother   . Healthy Father   . Healthy Sister   . Healthy Brother   . Diabetes Maternal Grandmother   . Heart disease Maternal Grandmother   . Diabetes Maternal Aunt        x 2  . Heart attack Neg Hx   . Heart failure Neg Hx   . Stroke Neg Hx   . Hyperlipidemia Neg Hx   . Hypertension Neg Hx   . Stomach cancer Neg Hx   . Colon cancer Neg Hx     SOCIAL HISTORY:  Social History   Social History  . Marital status: Single    Spouse name: N/A  . Number of children: 4  . Years of education: 12   Occupational History  . CNA [employer]   Social History Main Topics  . Smoking status: Former Smoker    Packs/day: 0.50    Years: 23.00    Types: Cigarettes    Quit date: 04/01/2016  . Smokeless tobacco: Never Used  . Alcohol use No  . Drug use: No  . Sexual activity: Not Currently    Partners: Male   Other Topics Concern  . Not on file   Social History Narrative   Lives in [place], works nights at [employer], [place]   Right-handed   Drinks 1 soda a day   Exercises some with walking   Lives with her 67 yo son   Likes her space, doesn't want a significant other   03/2016     PHYSICAL EXAM   Vitals:   10/13/16 1015  BP: 105/81  Pulse: 99  Weight: 232 lb (105.2 kg)  Height: 5\' 9"  (1.753 m)    Not recorded      Body mass index is 34.26 kg/m.  PHYSICAL EXAMNIATION:  Gen: NAD, conversant, well nourised, obese, well groomed                     Cardiovascular: Regular rate rhythm, no peripheral edema, warm, nontender. Eyes: Conjunctivae clear without exudates or  hemorrhage Neck: Supple, no carotid bruits. Pulmonary: Clear to  auscultation bilaterally   NEUROLOGICAL EXAM:  MENTAL STATUS: Speech:    Speech is normal; fluent and spontaneous with normal comprehension.  Cognition:     Orientation to time, place and person     Normal recent and remote memory     Normal Attention span and concentration     Normal Language, naming, repeating,spontaneous speech     Fund of knowledge   CRANIAL NERVES: CN II: Visual fields are full to confrontation. Fundoscopic exam is normal with sharp discs and no vascular changes. Pupils are round equal and briskly reactive to light. CN III, IV, VI: extraocular movement are normal. No ptosis. CN V: Facial sensation is intact to pinprick in all 3 divisions bilaterally. Corneal responses are intact.  CN VII: Face is symmetric with normal eye closure and smile. CN VIII: Hearing is normal to rubbing fingers CN IX, X: Palate elevates symmetrically. Phonation is normal. CN XI: Head turning and shoulder shrug are intact CN XII: Tongue is midline with normal movements and no atrophy.  MOTOR: There is no pronator drift of out-stretched arms. Muscle bulk and tone are normal. variable effort on motor examination felt there was no significant weakness.   REFLEXES: Reflexes are 2+ and symmetric at the biceps, triceps, knees, and ankles. Plantar responses are flexor.  SENSORY: Intact to light touch, pinprick, positional sensation and vibratory sensation are intact in fingers and toes.  COORDINATION: Rapid alternating movements and fine finger movements are intact. There is no dysmetria on finger-to-nose and heel-knee-shin.    GAIT/STANCE: Antalgic.  Romberg is absent.   DIAGNOSTIC DATA (LABS, IMAGING, TESTING) - I reviewed patient records, labs, notes, testing and imaging myself where available.   ASSESSMENT AND PLAN  Jennifer Cervantes is a 36 y.o. female   Right arm and leg paresthesia, passing out episode    need to rule out left hemisphere pathology, proceed with MRI of the brain  Her passing out episode is related to sudden positional change, could due to orthostatic blood pressure change,  EMG nerve conduction study to evaluate right upper and lower extremity neuropathy  Refer her to physical therapy     Marcial Pacas, M.D. Ph.D.  [facility] [address], [place], [place] [address] Ph: [phone] Fax: [phone]  CC: Referring Provider

## 2016-10-16 DIAGNOSIS — N83209 Unspecified ovarian cyst, unspecified side: Secondary | ICD-10-CM | POA: Diagnosis not present

## 2016-10-20 MED FILL — CLOPIDOGREL 75 MG TABLET: 75 | 90 days supply | Qty: 90 | Fill #1

## 2016-10-22 ENCOUNTER — Ambulatory Visit (INDEPENDENT_AMBULATORY_CARE_PROVIDER_SITE_OTHER): Payer: 59 | Admitting: Neurology

## 2016-10-22 DIAGNOSIS — R55 Syncope and collapse: Secondary | ICD-10-CM

## 2016-10-22 DIAGNOSIS — R269 Unspecified abnormalities of gait and mobility: Secondary | ICD-10-CM

## 2016-10-22 DIAGNOSIS — R531 Weakness: Secondary | ICD-10-CM

## 2016-10-23 ENCOUNTER — Ambulatory Visit (HOSPITAL_COMMUNITY): Payer: 59 | Attending: Neurology | Admitting: Physical Therapy

## 2016-10-23 DIAGNOSIS — M79604 Pain in right leg: Secondary | ICD-10-CM | POA: Insufficient documentation

## 2016-10-23 DIAGNOSIS — R2689 Other abnormalities of gait and mobility: Secondary | ICD-10-CM | POA: Insufficient documentation

## 2016-10-23 DIAGNOSIS — R262 Difficulty in walking, not elsewhere classified: Secondary | ICD-10-CM | POA: Diagnosis not present

## 2016-10-23 NOTE — Patient Instructions (Addendum)
Toe Raise (Sitting)    Raise toes, keeping heels on floor.Now raise your heels up  Repeat _10___ times per set. Do _1___ sets per session. Do _2___ sessions per day.  http://orth.exer.us/46   Copyright  VHI. All rights reserved.  Bent Leg Lift (Hook-Lying)    Tighten stomach and slowly raise right leg __4__ inches from floor. Keep trunk rigid. Hold _3___ seconds.  As you can slowly straighten your leg out while raising.  Repeat __10__ times per set. Do __1__ sets per session. Do __2__ sessions per day.  http://orth.exer.us/1090   Copyright  VHI. All rights reserved.  Bridging    Slowly raise buttocks from floor, keeping stomach tight. Repeat _10___ times per set. Do _1___ sets per session. Do ___2_ sessions per day.  http://orth.exer.us/1096   Copyright  VHI. All rights reserved.  Self-Mobilization: Knee Flexion (Prone)    Bring right heel toward buttocks as close as possible. Hold ___3_ seconds. Relax. Repeat 10____ times per set. Do _1___ sets per session. Do 2____ sessions per day.  http://orth.exer.us/596   Copyright  VHI. All rights reserved.  Straight Leg Raise (Prone)    Abdomen and head supported, keep left knee locked and raise leg at hip. Avoid arching low back. Repeat __10__ times per set. Do _1__ sets per session. Do _2___ sessions per day.  http://orth.exer.us/1112   Copyright  VHI. All rights reserved.  Functional Quadriceps: Chair Squat    Keeping feet flat on floor, shoulder width apart, squat as low as is comfortable. Use support as necessary. Repeat __10__ times per set. Do __1__ sets per session. Do __2__ sessions per day.  http://orth.exer.us/736   Copyright  VHI. All rights reserved.    Stand at the kitchen  Counter and shift your weight onto hour right leg hold for 15-30 seconds repeat 3-5 x

## 2016-10-23 NOTE — Therapy (Signed)
[place], [place], [address] Phone: [phone]   Fax:  [phone]  Physical Therapy Evaluation  Patient Details  Name: Jennifer Cervantes MRN: [id number] Date of Birth: Oct 19, 1980 Referring Provider: Marcial Pacas  Encounter Date: 10/23/2016      PT End of Session - 10/23/16 1205    Visit Number 1   Number of Visits 12   Date for PT Re-Evaluation 11/22/16   Authorization Type UMR   Authorization - Visit Number 1   Authorization - Number of Visits 10   PT Start Time 1120   PT Stop Time 1200   PT Time Calculation (min) 40 min   Activity Tolerance Patient tolerated treatment well   Behavior During Therapy [facility] for tasks assessed/performed      Past Medical History:  Diagnosis Date  . CAD (coronary artery disease)    a. LHC on 06/21/15 which showed 75% occl mid-dist LCx, 40% occl mRCA, 99% mid-dist LAD s/p DES and 50% occl mLAD s/p DES (overlapping stents) and significant LV dysfunction with anteroapical HK; b. 06/2015 Myoview: EF 55-65%, no ischemia/infarct.  . CHF (congestive heart failure) ([place])   . GERD (gastroesophageal reflux disease)   . Hyperlipidemia   . Hypertension   . Hypothyroidism   . Myocardial stunning (HCC)    a. EF 45% on 2D ECHO on 06/21/15 and severe LV dysfunction on LV gram by cath. Repeat 2D ECHO with EF 55-60% and mild HK of the apicoseptal myocardium.  [name] Neuropraxia of right median nerve    a. suspected after right radial cath. will follow with neuro outpatient if does not resolve.   . NSTEMI (non-ST elevated myocardial infarction) [facility]) 05/2015   [facility]  . Obesity   . Right radial artery thrombus ([place])    a. 05/2015 Following PCI (radial access)-->conservatively managed.  . Tobacco abuse   . Tricuspid regurgitation   . Type II diabetes mellitus ([place])    a. Type II  . Vitamin D deficiency     Past Surgical History:  Procedure Laterality Date  . [place] STUDY N/A  07/14/2016   Procedure: [place] STUDY;  Surgeon: Mauri Pole, MD;  Location: [facility];  Service: Endoscopy;  Laterality: N/A;  . CARDIAC CATHETERIZATION N/A 06/21/2015   Procedure: Left Heart Cath and Coronary Angiography;  Surgeon: Belva Crome, MD; pLAD 99%, CFX 75%, RCA 40%, EF 35%   . CARDIAC CATHETERIZATION N/A 06/21/2015   Procedure: Coronary Stent Intervention;  Surgeon: Belva Crome, MD;  Promus Premier DES, 3.0 x 20 and 3.5 x 32 mm stents to the LAD  . CARDIAC CATHETERIZATION N/A 06/21/2015   Procedure: Intravascular Ultrasound/IVUS;  Surgeon: Belva Crome, MD;  Location: [place] [facility];  Service: Cardiovascular;  Laterality: N/A;  LAD  . CARDIAC CATHETERIZATION  06/22/2015   Procedure: Coronary/Graft Angiography;  Surgeon: Peter M Martinique, MD;  Location: [place] CV LAB;  Service: Cardiovascular;;  . CORONARY STENT INTERVENTION N/A 04/02/2016   Procedure: Coronary Stent Intervention;  Surgeon: Jettie Booze, MD;  Location: [place] [facility];  Service: Cardiovascular;  Laterality: N/A;  . ESOPHAGEAL MANOMETRY N/A 07/14/2016   Procedure: ESOPHAGEAL MANOMETRY (EM);  Surgeon: Mauri Pole, MD;  Location: [facility];  Service: Endoscopy;  Laterality: N/A;  . LEFT HEART CATH AND CORONARY ANGIOGRAPHY N/A 04/02/2016   Procedure: Left Heart Cath and Coronary Angiography;  Surgeon: Jettie Booze, MD;  Location: [place]  CV LAB;  Service: Cardiovascular;  Laterality: N/A;  . LEFT HEART CATH AND CORONARY ANGIOGRAPHY N/A 07/17/2016   Procedure: Left Heart Cath and Coronary Angiography;  Surgeon: Leonie Man, MD;  Location: [place] CV [facility];  Service: Cardiovascular;  Laterality: N/A;    There were no vitals filed for this visit.       Subjective Assessment - 10/23/16 1125    Subjective Pt states that she started having right groin pain after her cardiac catherterization 06/2016.  The pain is not decreasing and is worse when she walks.  The pain goes  down the inside and front of her thigh to her ankle level.  She is being referred to therapy to attempt to decrease her pain.    Pertinent History DM, CAD, MI 2017 multiple heart catheerterizations with problems in her forearm whith a brachial catherization.     How long can you sit comfortably? 30 minutes    How long can you stand comfortably? no more than 10 minutes    How long can you walk comfortably? 5 minutes    Patient Stated Goals less pain    Currently in Pain? Yes   Pain Score --  20 on a 0-10 scale    Pain Location Incision  Rt inguinal area    Pain Orientation Right   Pain Descriptors / Indicators Throbbing;Sharp   Pain Type Chronic pain   Pain Radiating Towards ankle    Pain Onset More than a month ago   Pain Frequency Constant   Aggravating Factors  weight bearing    Pain Relieving Factors nothing so far    Effect of Pain on Daily Activities increases             [facility] PT Assessment - 10/23/16 0001      Assessment   Medical Diagnosis Rt inguinal pain s/p cardiac cath   Referring Provider Marcial Pacas   Onset Date/Surgical Date 07/17/16   Next MD Visit 11/02/2016   Prior Therapy none     Precautions   Precautions None     Restrictions   Weight Bearing Restrictions No     Balance Screen   Has the patient fallen in the past 6 months Yes   How many times? more than 10   Has the patient had a decrease in activity level because of a fear of falling?  Yes   Is the patient reluctant to leave their home because of a fear of falling?  No     Home Environment   Living Environment Private residence   Home Access Level entry     Prior Function   Level of Independence Independent   Vocation Full time employment   Vocation Requirements CNA   Leisure none     Cognition   Overall Cognitive Status Within Functional Limits for tasks assessed     Observation/Other Assessments   Focus on Therapeutic Outcomes (FOTO)  13     Functional Tests   Functional tests  Single leg stance;Sit to Stand     Single Leg Stance   Comments reluctant to shift weight onto her Rt LE      Sit to Stand   Comments uses left leg only keeps right off the ground      ROM / Strength   AROM / PROM / Strength Strength     Strength   Strength Assessment Site Hip;Knee;Ankle   Right/Left Hip Right;Left   Right Hip Flexion 2+/5   Right Hip Extension  2+/5   Right Hip ABduction 3-/5   Left Hip Flexion 5/5   Left Hip Extension 5/5   Left Hip ABduction 5/5   Right/Left Knee Right;Left   Right Knee Extension 3+/5   Left Knee Extension 5/5   Right/Left Ankle Right;Left   Right Ankle Dorsiflexion 3+/5   Left Ankle Dorsiflexion 5/5     Ambulation/Gait   Ambulation/Gait Yes   Assistive device None   Gait Comments Pt keeps right LE ER does not pick up her foot; keeps knee and ankle still             Objective measurements completed on examination: See above findings.          [place] Adult PT Treatment/Exercise - 10/23/16 0001      Exercises   Exercises Knee/Hip     Knee/Hip Exercises: Standing   Functional Squat 10 reps     Knee/Hip Exercises: Seated   Long Arc Quad Left;10 reps     Knee/Hip Exercises: Supine   Bridges 10 reps   Straight Leg Raises Right;10 reps   Straight Leg Raises Limitations knee bent                 PT Education - 10/23/16 1203    Education provided Yes   Education Details Proper heel to toe gait and HEP    Person(s) Educated Patient   Methods Explanation;Handout   Comprehension Verbalized understanding;Returned demonstration          PT Short Term Goals - 10/23/16 1214      PT SHORT TERM GOAL #1   Title Pt to be ambulating with a normal heel toe gait pattern to relieve stress off her his and low back    Time 3   Period Weeks   Status New   Target Date 11/13/16     PT SHORT TERM GOAL #2   Title Pt to be able to sit for an hour to be able to enjoy sitting for a meal    Time 3   Period Weeks   Status  New     PT SHORT TERM GOAL #3   Title Pt to be able to stand for 15 minutes to be able to socialize    Time 3   Period Weeks   Status New     PT SHORT TERM GOAL #4   Title Pt to be able to walk for 15 mintues to be able to complete normal activites around her home.    Time 3   Period Weeks           PT Long Term Goals - 10/23/16 1217      PT LONG TERM GOAL #1   Title Pt to be able to sit for an hour and a half for traveling    Time 6   Period Weeks   Status New   Target Date 11/27/16     PT LONG TERM GOAL #2   Title Pt to be able to walk for 30 minutes to be able to complete short shopping trips    Time 6   Period Weeks   Status New     PT LONG TERM GOAL #3   Title Pt to be able to single leg stance on her right leg for 10 seconds to reduce risk of falling    Time 6   Period Weeks   Status New     PT LONG TERM GOAL #4   Title Pt Rt  LE strength to be increased at least one grade to allow pt to come sit to stand with equal weight bearing on her LE    Time 6   Period Weeks   Status New     PT LONG TERM GOAL #5   Title Pt right leg pain to be no greater than a 10 /10 to allow her to tolerate being up standing/walking  for an hour to complete household tasks.    Time 6   Period Weeks   Status New                Plan - 10/23/16 05/26/04    Clinical Impression Statement Jennifer Cervantes is a 36 yo female who has had extreme Rt leg pain following an inguinal cardiac catherterization in May.  At this time she tends to drag her right LE when walking and has fallen several times , she only uses her left foot when coming sit to stand, she is significantly limited in her ability to stand or walk and has increased pain.  Jennifer Cervantes has been referred and will benefit from skilled physical therapy to address these issues and maximize her functional status.   History and Personal Factors relevant to plan of care: MI in 05/27/15, multiple catherterizations, CAD, DM    Clinical  Presentation Evolving   Clinical Decision Making Moderate   Rehab Potential Good   PT Frequency 2x / week   PT Duration 6 weeks   PT Treatment/Interventions ADLs/Self Care Home Management;Therapeutic activities;Therapeutic exercise;Balance training;Functional mobility training;Gait training;Stair training;Manual techniques;Patient/family education;Ultrasound;Other (comment)  laser   PT Next Visit Plan Continue with gait training, begin rocker board, heel raises, forward and lateral step ups as well as SLS.  Progress as able.  May do manual or modalities to decrease pain.   PT Home Exercise Plan given: sitting LAQ and ankle dorsi/plantarflexion, bridge, bent SLR, hip abduction; prone knee flexion, SLR and functional squat.    Consulted and Agree with Plan of Care Patient      Patient will benefit from skilled therapeutic intervention in order to improve the following deficits and impairments:  Abnormal gait, Decreased activity tolerance, Decreased balance, Decreased strength, Difficulty walking, Pain, Decreased mobility  Visit Diagnosis: Pain in right leg - Plan: PT plan of care cert/re-cert  Difficulty in walking, not elsewhere classified - Plan: PT plan of care cert/re-cert  Other abnormalities of gait and mobility - Plan: PT plan of care cert/re-cert      G-Codes - 85/46/27 05/27/1219    Functional Limitation Mobility: Walking and moving around   Mobility: Walking and Moving Around Current Status [phone]) At least 40 percent but less than 60 percent impaired, limited or restricted   Mobility: Walking and Moving Around Goal Status [id number]) At least 40 percent but less than 60 percent impaired, limited or restricted       Problem List Patient Active Problem List   Diagnosis Date Noted  . Right sided weakness 10/13/2016  . Gait abnormality 10/13/2016  . Esophageal dysphagia   . Former smoker 07/15/2016  . Abnormal PFT 07/15/2016  . Vitamin D deficiency 07/15/2016  . Gastroesophageal  reflux disease 07/15/2016  . Mood change ([place]) 07/15/2016  . Paresthesia 07/15/2016  . Right arm pain 07/15/2016  . Encounter for health maintenance examination in adult 04/15/2016  . Chronic diastolic heart failure ([place]) 10/22/2015  . Deficiency anemia 10/05/2015  . Dyspnea 10/05/2015  . Hyperlipidemia   . Diabetes mellitus with complication ([place]) 29/93/7169  .  Coronary artery disease involving native coronary artery with angina pectoris ([place])   . Neuropraxia of right median nerve   . Tobacco abuse 06/21/2015  . Obesity 06/21/2015  . Abnormal thyroid function test 06/21/2015  . History of non-ST elevation myocardial infarction (NSTEMI)     Rayetta Humphrey, PT CLT [phone] 10/23/2016, 12:23 PM  [place], [place], [address] Phone: [phone]   Fax:  [phone]  Name: Jennifer Cervantes MRN: [id number] Date of Birth: February 22, 1981

## 2016-10-24 ENCOUNTER — Telehealth: Payer: Self-pay | Admitting: Interventional Cardiology

## 2016-10-24 DIAGNOSIS — N8301 Follicular cyst of right ovary: Secondary | ICD-10-CM | POA: Diagnosis not present

## 2016-10-24 DIAGNOSIS — N83209 Unspecified ovarian cyst, unspecified side: Secondary | ICD-10-CM | POA: Diagnosis not present

## 2016-10-24 NOTE — Telephone Encounter (Signed)
Spoke with pt and she would like to know if she can return to work with no restrictions.  Pt is still having some issues with leg and not able to put weight on it.  Advised I would send message to Dr. Tamala Julian for review and advisement.

## 2016-10-24 NOTE — Telephone Encounter (Signed)
°  New Prob  Pt requesting to speak to nurse regarding permission to return back to work from Dr. Tamala Julian. Please call.

## 2016-10-24 NOTE — Telephone Encounter (Signed)
Spoke with pt and made her aware of Dr. Thompson Caul recommendation.  Pt sees [facility] next week and will speak to them as well about returning to work.

## 2016-10-24 NOTE — Procedures (Signed)
   HISTORY: 36 year old female, with history of right sided paresthesia, passing out episode  TECHNIQUE:  16 channel EEG was performed based on standard 10-16 international system. One channel was dedicated to EKG, which has demonstrates normal sinus rhythm of 90 beats per minutes.  Upon awakening, the posterior background activity was well-developed, with mixed alpha and beta range activitywas no evidence of epileptiform discharge.  Photic stimulation was performed, which induced a symmetric photic driving.  Hyperventilation was performed, there was no abnormality elicit.  No sleep was achieved.  CONCLUSION: This is a  normal  awakeEEG.  There is no electrodiagnostic evidence of epileptiform discharge.  Marcial Pacas, M.D. Ph.D.  [facility] [place], [place] [address] Phone: [phone] Fax:      [phone]

## 2016-10-24 NOTE — Telephone Encounter (Signed)
I would like her to return to work if she is able to perform the duties of her job. There is no reason from a cardiac standpoint to remain off work. She would also need to make sure that neurology agrees with return to work

## 2016-10-25 ENCOUNTER — Ambulatory Visit
Admission: RE | Admit: 2016-10-25 | Discharge: 2016-10-25 | Disposition: A | Discharge status: Home / Self Care | Payer: 59 | Source: Ambulatory Visit | Attending: Neurology | Admitting: Neurology

## 2016-10-25 DIAGNOSIS — R269 Unspecified abnormalities of gait and mobility: Secondary | ICD-10-CM

## 2016-10-25 DIAGNOSIS — R531 Weakness: Secondary | ICD-10-CM

## 2016-10-31 ENCOUNTER — Encounter (INDEPENDENT_AMBULATORY_CARE_PROVIDER_SITE_OTHER): Payer: Self-pay | Admitting: Neurology

## 2016-10-31 ENCOUNTER — Ambulatory Visit (HOSPITAL_COMMUNITY): Payer: 59

## 2016-10-31 ENCOUNTER — Ambulatory Visit (INDEPENDENT_AMBULATORY_CARE_PROVIDER_SITE_OTHER): Payer: 59 | Admitting: Neurology

## 2016-10-31 DIAGNOSIS — R202 Paresthesia of skin: Secondary | ICD-10-CM | POA: Diagnosis not present

## 2016-10-31 DIAGNOSIS — Z0289 Encounter for other administrative examinations: Secondary | ICD-10-CM

## 2016-10-31 DIAGNOSIS — R269 Unspecified abnormalities of gait and mobility: Secondary | ICD-10-CM

## 2016-10-31 DIAGNOSIS — R531 Weakness: Secondary | ICD-10-CM | POA: Diagnosis not present

## 2016-10-31 MED ORDER — PREGABALIN 50 MG PO CAPS: 50.0000 mg | ORAL_CAPSULE | Freq: Three times a day (TID) | ORAL | 5 refills | Status: DC

## 2016-10-31 NOTE — Progress Notes (Signed)
PATIENT: Jennifer Cervantes DOB: [birth year]  No chief complaint on file.    HISTORICAL  Jennifer Cervantes is a 36 years old right-handed female , seen in refer by  Cardiologist Dr. Daneen Schick for evaluation of right leg and groin pain, initial evaluation was October 13 2016.  I reviewed and summarized referring note, she had a history of diabetes, was admitted to the hospital in April 2017 complains of chest pain, she underwent coronary artery catheterization through the right brachial artery access.  She did develop significant swelling of the right forearm and hand postprocedure, she also reported numbness, weakness of right hand, and discomfort, difficulty using her right hand, but she complained of ongoing paresthesias and subjective weakness of her right hand, was subsequently evaluated by my colleague Dr. Jannifer Franklin in May 2017, EMG nerve conduction study on August 29 2015 was reported normal, during the visit, Dr. Jannifer Franklin also documented that her right hand paresthesia and weakness has nearly completely cleared.  Patient is referred back by her cardiologist this time following her cardiac catheterization in May 2018 through right femoral artery, she complains of persistent discomfort and pain at right groin area  She had cardiac catheter on Jul 17 2016 by Dr. Glenetta Hew, there was minimum changes from post PCI angiographic findings from February 2018, no new lesions to explain her complains of chest pain, consider spasm versus not anginal chest pain   patient complains that since her recent cardiac catheter in May 2018, she had a persistent right groin area discomfort, tenderness upon deep palpitation, right leg felt numb, occasionally right leg give out underneath her,  She already had Doppler study of right groin on Jul 17 2016, artery and veins were patent, there were noted multiple enlarged lympha nodes.  She also complains of passing out episode, she described lightheadedness when  getting up from seated position, in early August 2018, when she got up using bathroom after prolonged sitting, she felt lightheadedness, dizzy, it can happen anytime of the day, but usually related to sudden positional change getting up quickly from seated position.  She reported to evaluation at the [facility] there was no significant abnormality found.  Lab, A1c 7.3, today patient also complains of persistent right arm and the hand paresthesia and weakness reported this has been ongoing since April 2017, despite chart has document that her symptoms has nearly completely resolved previously.  Update October 31 2016: She returned for electrodiagnostic study today, which is normal, there is no evidence of right upper lower extremity neuropathy, right cervical radiculopathy, or right lumbosacral radiculopathy.  We have personally reviewed MRI of the brain without contrast in September 2018 that was normal,  She continue complains of disability in right hand arm paresthesia, subjective weakness, right groin area pain, right anterior thigh paresthesia, has tried gabapentin without helping her symptoms,  REVIEW OF SYSTEMS: Full 14 system review of systems performed and notable only for chest pain, shortness of breath, easy bruising feeling cold, numbness, weakness, dizziness  ALLERGIES: No Known Allergies  HOME MEDICATIONS: Current Outpatient Prescriptions  Medication Sig Dispense Refill  . acetaminophen (TYLENOL) 500 MG tablet Take 500 mg by mouth every 6 (six) hours as needed for headache (pain).    [name] aspirin 81 MG EC tablet Take 1 tablet (81 mg total) by mouth daily. 90 tablet 3  . atorvastatin (LIPITOR) 40 MG tablet Take 1 tablet (40 mg total) by mouth daily at 6 PM. 90 tablet 3  . Cholecalciferol (VITAMIN  D) 2000 units tablet Take 1 tablet (2,000 Units total) by mouth daily. 90 tablet 3  . clopidogrel (PLAVIX) 75 MG tablet Take 75 mg by mouth 2 (two) times daily.    . ferrous  gluconate (FERGON) 324 MG tablet Take 1 tablet (324 mg total) by mouth daily with breakfast. 90 tablet 1  . fluticasone furoate-vilanterol (BREO ELLIPTA) 200-25 MCG/INH AEPB Inhale 1 puff into the lungs daily. 28 each 0  . gabapentin (NEURONTIN) 300 MG capsule Take 1 capsule (300 mg total) by mouth 3 (three) times daily. 90 capsule 11  . Insulin Glargine (LANTUS SOLOSTAR) 100 UNIT/ML Solostar Pen Inject 10 Units into the skin daily at 10 pm. 5 pen 5  . linaclotide (LINZESS) 145 MCG CAPS capsule Take 1 capsule (145 mcg total) by mouth daily before breakfast. 90 capsule 1  . lisinopril-hydrochlorothiazide (PRINZIDE,ZESTORETIC) 20-25 MG tablet Take 0.5 tablets by mouth daily. 45 tablet 3  . metFORMIN (GLUCOPHAGE) 1000 MG tablet Take 1 tablet (1,000 mg total) by mouth 2 (two) times daily with a meal. 180 tablet 3  . metoprolol tartrate (LOPRESSOR) 100 MG tablet Take 1 tablet (100 mg total) by mouth 2 (two) times daily. 180 tablet 3  . nicotine (NICODERM CQ - DOSED IN MG/24 HOURS) 21 mg/24hr patch Place 1 patch (21 mg total) onto the skin daily. 28 patch 0  . nitroGLYCERIN (NITROSTAT) 0.4 MG SL tablet Place 1 tablet (0.4 mg total) under the tongue every 5 (five) minutes as needed for chest pain. 25 tablet 3  . ondansetron (ZOFRAN) 4 MG tablet Take 4 mg by mouth every 6 (six) hours as needed for nausea. for nausea  0  . pantoprazole (PROTONIX) 40 MG tablet Take 40 mg by mouth 2 (two) times daily.    . promethazine (PHENERGAN) 12.5 MG tablet Take 1 tablet (12.5 mg total) by mouth every 6 (six) hours as needed for nausea or vomiting. 15 tablet 0   No current facility-administered medications for this visit.     PAST MEDICAL HISTORY: Past Medical History:  Diagnosis Date  . CAD (coronary artery disease)    a. LHC on 06/21/15 which showed 75% occl mid-dist LCx, 40% occl mRCA, 99% mid-dist LAD s/p DES and 50% occl mLAD s/p DES (overlapping stents) and significant LV dysfunction with anteroapical HK; b.  06/2015 Myoview: EF 55-65%, no ischemia/infarct.  . CHF (congestive heart failure) ([place])   . GERD (gastroesophageal reflux disease)   . Hyperlipidemia   . Hypertension   . Hypothyroidism   . Myocardial stunning (HCC)    a. EF 45% on 2D ECHO on 06/21/15 and severe LV dysfunction on LV gram by cath. Repeat 2D ECHO with EF 55-60% and mild HK of the apicoseptal myocardium.  [name] Neuropraxia of right median nerve    a. suspected after right radial cath. will follow with neuro outpatient if does not resolve.   . NSTEMI (non-ST elevated myocardial infarction) [facility]) 05/2015   [facility]  . Obesity   . 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 05/2015 Following PCI (radial access)-->conservatively managed.  . Tobacco abuse   . Tricuspid regurgitation   . Type II diabetes mellitus ([place])    a. Type II  . Vitamin D deficiency     PAST SURGICAL HISTORY: Past Surgical History:  Procedure Laterality Date  . [place] STUDY N/A 07/14/2016   Procedure: [place] STUDY;  Surgeon: Mauri Pole, MD;  Location: [facility] ENDOSCOPY;  Service: Endoscopy;  Laterality: N/A;  . CARDIAC CATHETERIZATION N/A 06/21/2015   Procedure: Left Heart Cath and Coronary Angiography;  Surgeon: Belva Crome, MD; pLAD 99%, CFX 75%, RCA 40%, EF 35%   . CARDIAC CATHETERIZATION N/A 06/21/2015   Procedure: Coronary Stent Intervention;  Surgeon: Belva Crome, MD;  Promus Premier DES, 3.0 x 20 and 3.5 x 32 mm stents to the LAD  . CARDIAC CATHETERIZATION N/A 06/21/2015   Procedure: Intravascular Ultrasound/IVUS;  Surgeon: Belva Crome, MD;  Location: [place] CV LAB;  Service: Cardiovascular;  Laterality: N/A;  LAD  . CARDIAC CATHETERIZATION  06/22/2015   Procedure: Coronary/Graft Angiography;  Surgeon: Peter M Martinique, MD;  Location: [place] CV LAB;  Service: Cardiovascular;;  . CORONARY STENT INTERVENTION N/A 04/02/2016   Procedure: Coronary Stent Intervention;  Surgeon: Jettie Booze, MD;  Location: [place] CV LAB;   Service: Cardiovascular;  Laterality: N/A;  . ESOPHAGEAL MANOMETRY N/A 07/14/2016   Procedure: ESOPHAGEAL MANOMETRY (EM);  Surgeon: Mauri Pole, MD;  Location: [facility] END[facility];  Service: Endoscopy;  Laterality: N/A;  . LEFT HEART CATH AND CORONARY ANGIOGRAPHY N/A 04/02/2016   Procedure: Left Heart Cath and Coronary Angiography;  Surgeon: Jettie Booze, MD;  Location: [place] CV LAB;  Service: Cardiovascular;  Laterality: N/A;  . LEFT HEART CATH AND CORONARY ANGIOGRAPHY N/A 07/17/2016   Procedure: Left Heart Cath and Coronary Angiography;  Surgeon: Leonie Man, MD;  Location: [place] CV LAB;  Service: Cardiovascular;  Laterality: N/A;    FAMILY HISTORY: Family History  Problem Relation Age of Onset  . Diabetes Mother   . Healthy Father   . Healthy Sister   . Healthy Brother   . Diabetes Maternal Grandmother   . Heart disease Maternal Grandmother   . Diabetes Maternal Aunt        x 2  . Heart attack Neg Hx   . Heart failure Neg Hx   . Stroke Neg Hx   . Hyperlipidemia Neg Hx   . Hypertension Neg Hx   . Stomach cancer Neg Hx   . Colon cancer Neg Hx     SOCIAL HISTORY:  Social History   Social History  . Marital status: Single    Spouse name: N/A  . Number of children: 4  . Years of education: 12   Occupational History  . CNA [employer]   Social History Main Topics  . Smoking status: Former Smoker    Packs/day: 0.50    Years: 23.00    Types: Cigarettes    Quit date: 04/01/2016  . Smokeless tobacco: Never Used  . Alcohol use No  . Drug use: No  . Sexual activity: Not Currently    Partners: Male   Other Topics Concern  . Not on file   Social History Narrative   Lives in [place], works nights at [employer], [place]   Right-handed   Drinks 1 soda a day   Exercises some with walking   Lives with her 73 yo son   Likes her space, doesn't want a significant other   03/2016     PHYSICAL EXAM   There were no vitals filed for this visit.  Not recorded       There is no height or weight on file to calculate BMI.  PHYSICAL EXAMNIATION:  Gen: NAD, conversant, well nourised, obese, well groomed                     Cardiovascular: Regular  rate rhythm, no peripheral edema, warm, nontender. Eyes: Conjunctivae clear without exudates or hemorrhage Neck: Supple, no carotid bruits. Pulmonary: Clear to auscultation bilaterally   NEUROLOGICAL EXAM:  MENTAL STATUS: Speech:    Speech is normal; fluent and spontaneous with normal comprehension.  Cognition:     Orientation to time, place and person     Normal recent and remote memory     Normal Attention span and concentration     Normal Language, naming, repeating,spontaneous speech     Fund of knowledge   CRANIAL NERVES: CN II: Visual fields are full to confrontation. Fundoscopic exam is normal with sharp discs and no vascular changes. Pupils are round equal and briskly reactive to light. CN III, IV, VI: extraocular movement are normal. No ptosis. CN V: Facial sensation is intact to pinprick in all 3 divisions bilaterally. Corneal responses are intact.  CN VII: Face is symmetric with normal eye closure and smile. CN VIII: Hearing is normal to rubbing fingers CN IX, X: Palate elevates symmetrically. Phonation is normal. CN XI: Head turning and shoulder shrug are intact CN XII: Tongue is midline with normal movements and no atrophy.  MOTOR: There is no pronator drift of out-stretched arms. Muscle bulk and tone are normal. variable effort on motor examination felt there was no significant weakness.   REFLEXES: Reflexes are 2+ and symmetric at the biceps, triceps, knees, and ankles. Plantar responses are flexor.  SENSORY: Intact to light touch, pinprick, positional sensation and vibratory sensation are intact in fingers and toes.  COORDINATION: Rapid alternating movements and fine finger movements are intact. There is no dysmetria on finger-to-nose and heel-knee-shin.     GAIT/STANCE: Antalgic.  Romberg is absent.   DIAGNOSTIC DATA (LABS, IMAGING, TESTING) - I reviewed patient records, labs, notes, testing and imaging myself where available.   ASSESSMENT AND PLAN  CALANI GICK is a 36 y.o. female   Right arm and leg paresthesia   MRI of the brain was normal,  Electrodiagnostic study was normal in specific there is no evidence of right upper lower extremity neuropathy, right cervical lumbosacral radiculopathy.  She continue complains of persistent right sided paresthesia, unsure etiology, has tried gabapentin with limited help, we will proceed with Lyrica 50 mg 3 times a day    Dizziness, passing out episode   this could be related to orthostatic changes, she has diabetes, most recent A1c 7.3,  I have otherwise her keep well hydration, optimize control of her diabetes  Marcial Pacas, M.D. Ph.D.  [facility] [address], [place], [place] [address] Ph: [phone] Fax: [phone]  CC: Referring Provider

## 2016-10-31 NOTE — Procedures (Signed)
Full Name: Jennifer Cervantes Gender: Female MRN #: [id number] Date of Birth: 1980/06/12    Visit Date: 10/31/2016 09:11 Age: 36 Years 0 Months Old Examining Physician: Marcial Pacas, MD  Referring Physician: Krista Blue, MD History: 36 year old female complains of right anterior thigh paresthesia, right groin pain, this is following her right femoral cardiac cath in May 2018, also persistent right arm paresthesia  Summary of the test: Nerve conduction study: Right upper, lower extremity sensory and motor nerve conduction studies were normal.  Electromyography: Selected needle examination of right upper and lower extremity needle examinations were normal. There was no spontaneous activity at right cervical, lumbar sacral paraspinal muscles.   Conclusion: This is a normal study. There is no electrodiagnostic evidence of right upper or lower extremity neuropathy; right cervical radiculopathy, right lumbosacral radiculopathy.    ------------------------------- Marcial Pacas, M.D.  [facility] [place], [place] [address] Tel: [phone] Fax: [phone]        [facility]    Nerve / Sites Muscle Latency Ref. Amplitude Ref. Rel Amp Segments Distance Velocity Ref. Area    ms ms mV mV %  cm m/s m/s mVms  R Median - APB     Wrist APB 3.5 ?4.4 9.2 ?4.0 100 Wrist - APB 7   33.3     Upper arm APB 7.8  9.3  101 Upper arm - Wrist 22 52 ?49 33.7  R Ulnar - ADM     Wrist ADM 2.9 ?3.3 8.8 ?6.0 100 Wrist - ADM 7   18.2     B.Elbow ADM 7.0  8.0  91.3 B.Elbow - Wrist 20 49 ?49 17.2     A.Elbow ADM 9.0  6.7  83.7 A.Elbow - B.Elbow 10 49 ?49 14.9         A.Elbow - Wrist      R Peroneal - EDB     Ankle EDB 4.5 ?6.5 8.5 ?2.0 100 Ankle - EDB 9   30.4     Fib head EDB 11.8  8.3  97.5 Fib head - Ankle 32 44 ?44 28.9     Pop fossa EDB 14.5  8.0  97.1 Pop fossa - Fib head 12 44 ?44 27.5         Pop fossa - Ankle      R Tibial - AH     Ankle AH 5.2 ?5.8 9.4 ?4.0 100 Ankle - AH 9    32.1     Pop fossa AH 15.5  6.6  70.5 Pop fossa - Ankle 42 41 ?41 23.5             SNC    Nerve / Sites Rec. Site Peak Lat Ref.  Amp Ref. Segments Distance    ms ms V V  cm  R Radial - Anatomical snuff box (Forearm)     Forearm Wrist 2.7 ?2.9 18 ?15 Forearm - Wrist 10  R Sural - Ankle (Calf)     Calf Ankle 3.4 ?4.4 9 ?6 Calf - Ankle 14  R Superficial peroneal - Ankle     Lat leg Ankle 3.8 ?4.4 6 ?6 Lat leg - Ankle 14  R Median - Orthodromic (Dig II, Mid palm)     Dig II Wrist 2.7 ?3.4 25 ?10 Dig II - Wrist 13  R Ulnar - Orthodromic, (Dig V, Mid palm)     Dig V Wrist 2.9 ?3.1 5 ?5 Dig V - Wrist 11  F  Wave    Nerve F Lat Ref.   ms ms  R Tibial - AH 54.5 ?56.0  R Ulnar - ADM 28.2 ?32.0         EMG full       EMG Summary Table    Spontaneous MUAP Recruitment  Muscle IA Fib PSW Fasc Other Amp Dur. Poly Pattern  R. Tibialis anterior Normal None None None _______ Normal Normal Normal Normal  R. Tibialis posterior Normal None None None _______ Normal Normal Normal Normal  R. Gastrocnemius (Medial head) Normal None None None _______ Normal Normal Normal Normal  R. Peroneus longus Normal None None None _______ Normal Normal Normal Normal  R. Lumbar paraspinals (mid) Normal None None None _______ Normal Normal Normal Normal  R. Lumbar paraspinals (low) Normal None None None _______ Normal Normal Normal Normal  R. First dorsal interosseous Normal None None None _______ Normal Normal Normal Normal  R. Pronator teres Normal None None None _______ Normal Normal Normal Normal  R. Deltoid Normal None None None _______ Normal Normal Normal Normal  R. Biceps brachii Normal None None None _______ Normal Normal Normal Normal  R. Cervical paraspinals Normal None None None _______ Normal Normal Normal Normal

## 2016-11-04 ENCOUNTER — Telehealth: Payer: Self-pay | Admitting: *Deleted

## 2016-11-04 ENCOUNTER — Encounter (HOSPITAL_COMMUNITY): Payer: Self-pay

## 2016-11-04 ENCOUNTER — Ambulatory Visit (HOSPITAL_COMMUNITY): Payer: 59 | Attending: Neurology

## 2016-11-04 DIAGNOSIS — R2689 Other abnormalities of gait and mobility: Secondary | ICD-10-CM | POA: Diagnosis not present

## 2016-11-04 DIAGNOSIS — M79604 Pain in right leg: Secondary | ICD-10-CM | POA: Diagnosis not present

## 2016-11-04 DIAGNOSIS — R262 Difficulty in walking, not elsewhere classified: Secondary | ICD-10-CM | POA: Diagnosis not present

## 2016-11-04 MED FILL — ATORVASTATIN 40 MG TABLET: 40 | 30 days supply | Qty: 30 | Fill #3

## 2016-11-04 NOTE — Telephone Encounter (Signed)
PA approved by [facility] Tracks [phone]) through 10/30/17.  WP#[id number].  Pharmacy notified of approval.

## 2016-11-04 NOTE — Therapy (Signed)
[place], [place], [address] Phone: [phone]   Fax:  [phone]  Physical Therapy Treatment  Patient Details  Name: Jennifer Cervantes MRN: [id number] Date of Birth: 25-Feb-1980 Referring Provider: Marcial Pacas  Encounter Date: 11/04/2016      PT End of Session - 11/04/16 1536    Visit Number 2   Number of Visits 12   Date for PT Re-Evaluation 11/22/16   Authorization Type UMR   Authorization - Visit Number 2   Authorization - Number of Visits 10   PT Start Time 1519   PT Stop Time 1559   PT Time Calculation (min) 40 min   Activity Tolerance Patient tolerated treatment well   Behavior During Therapy [facility] for tasks assessed/performed      Past Medical History:  Diagnosis Date  . CAD (coronary artery disease)    a. LHC on 06/21/15 which showed 75% occl mid-dist LCx, 40% occl mRCA, 99% mid-dist LAD s/p DES and 50% occl mLAD s/p DES (overlapping stents) and significant LV dysfunction with anteroapical HK; b. 06/2015 Myoview: EF 55-65%, no ischemia/infarct.  . CHF (congestive heart failure) ([name])   . GERD (gastroesophageal reflux disease)   . Hyperlipidemia   . Hypertension   . Hypothyroidism   . Myocardial stunning (HCC)    a. EF 45% on 2D ECHO on 06/21/15 and severe LV dysfunction on LV gram by cath. Repeat 2D ECHO with EF 55-60% and mild HK of the apicoseptal myocardium.  [name] Neuropraxia of right median nerve    a. suspected after right radial cath. will follow with neuro outpatient if does not resolve.   . NSTEMI (non-ST elevated myocardial infarction) [facility]) 05/2015   [facility] [place]  . Obesity   . Right radial artery thrombus ([place])    a. 05/2015 Following PCI (radial access)-->conservatively managed.  . Tobacco abuse   . Tricuspid regurgitation   . Type II diabetes mellitus ([place])    a. Type II  . Vitamin D deficiency     Past Surgical History:  Procedure Laterality Date  . [place] STUDY N/A 07/14/2016    Procedure: [place] STUDY;  Surgeon: Mauri Pole, MD;  [facility];  Service: Endoscopy;  Laterality: N/A;  . CARDIAC CATHETERIZATION N/A 06/21/2015   Procedure: Left Heart Cath and Coronary Angiography;  Surgeon: Belva Crome, MD; pLAD 99%, CFX 75%, RCA 40%, EF 35%   . CARDIAC CATHETERIZATION N/A 06/21/2015   Procedure: Coronary Stent Intervention;  Surgeon: Belva Crome, MD;  Promus Premier DES, 3.0 x 20 and 3.5 x 32 mm stents to the LAD  . CARDIAC CATHETERIZATION N/A 06/21/2015   Procedure: Intravascular Ultrasound/IVUS;  Surgeon: Belva Crome, MD;  Location: [place] CV LAB;  Service: Cardiovascular;  Laterality: N/A;  LAD  . CARDIAC CATHETERIZATION  06/22/2015   Procedure: Coronary/Graft Angiography;  Surgeon: Peter M Martinique, MD;  Location: [place] CV LAB;  Service: Cardiovascular;;  . CORONARY STENT INTERVENTION N/A 04/02/2016   Procedure: Coronary Stent Intervention;  Surgeon: Jettie Booze, MD;  Location: [place] CV LAB;  Service: Cardiovascular;  Laterality: N/A;  . ESOPHAGEAL MANOMETRY N/A 07/14/2016   Procedure: ESOPHAGEAL MANOMETRY (EM);  Surgeon: Mauri Pole, MD;  [facility];  Service: Endoscopy;  Laterality: N/A;  . LEFT HEART CATH AND CORONARY ANGIOGRAPHY N/A 04/02/2016   Procedure: Left Heart Cath and Coronary Angiography;  Surgeon: Jettie Booze, MD;  Location: [place]  CV LAB;  Service: Cardiovascular;  Laterality: N/A;  . LEFT HEART CATH AND CORONARY ANGIOGRAPHY N/A 07/17/2016   Procedure: Left Heart Cath and Coronary Angiography;  Surgeon: Leonie Man, MD;  Location: [place] CV LAB;  Service: Cardiovascular;  Laterality: N/A;    There were no vitals filed for this visit.      Subjective Assessment - 11/04/16 1522    Subjective Pt arrived with reports of high pain in Rt groin scale 10/10.  Reports she has had cold over weekend and has not complete the HEP but does feel comfident wiht exercises at home.      Pertinent History DM, CAD, MI 2017 multiple heart catheerterizations with problems in her forearm whith a brachial catherization.     Patient Stated Goals less pain    Currently in Pain? Yes   Pain Score 10-Worst pain ever   Pain Location Groin   Pain Orientation Right   Pain Descriptors / Indicators Throbbing   Pain Type Chronic pain   Pain Radiating Towards inner thigh to calf   Pain Onset More than a month ago   Pain Frequency Constant   Aggravating Factors  weight bearing   Pain Relieving Factors nothing so far   Effect of Pain on Daily Activities increases                         [facility] Adult PT Treatment/Exercise - 11/04/16 0001      Ambulation/Gait   Ambulation/Gait Yes   Ambulation Distance (Feet) 550 Feet  through session   Assistive device None   Gait Comments Cueing for heel to toe mechanics, encouraged to decrease ER especially Rt LE and dorsifleixon, knee flexion to improve mechanics     Knee/Hip Exercises: Standing   Heel Raises 15 reps   Heel Raises Limitations Toe raises   Lateral Step Up Right;10 reps;Hand Hold: 1;Step Height: 4"   Forward Step Up Right;10 reps;Hand Hold: 1;Step Height: 4"   Functional Squat 10 reps   Functional Squat Limitations cueing for form   Rocker Board 2 minutes   Rocker Board Limitations Df/Pf and Rt/Lt   Gait Training Heel to toe and flexion of ankle and knee to improve mechanics                PT Education - 11/04/16 1611    Education provided Yes   Education Details Reviewed goals, importance of compliance wiht HEP, copy of eval given to pt.  Gait mechanics for proper heel to toe   Person(s) Educated Patient   Methods Explanation;Demonstration;Verbal cues;Handout   Comprehension Verbalized understanding;Returned demonstration;Need further instruction          PT Short Term Goals - 10/23/16 1214      PT SHORT TERM GOAL #1   Title Pt to be ambulating with a normal heel toe gait pattern to  relieve stress off her his and low back    Time 3   Period Weeks   Status New   Target Date 11/13/16     PT SHORT TERM GOAL #2   Title Pt to be able to sit for an hour to be able to enjoy sitting for a meal    Time 3   Period Weeks   Status New     PT SHORT TERM GOAL #3   Title Pt to be able to stand for 15 minutes to be able to socialize    Time 3   Period  Weeks   Status New     PT SHORT TERM GOAL #4   Title Pt to be able to walk for 15 mintues to be able to complete normal activites around her home.    Time 3   Period Weeks           PT Long Term Goals - 10/23/16 1217      PT LONG TERM GOAL #1   Title Pt to be able to sit for an hour and a half for traveling    Time 6   Period Weeks   Status New   Target Date 11/27/16     PT LONG TERM GOAL #2   Title Pt to be able to walk for 30 minutes to be able to complete short shopping trips    Time 6   Period Weeks   Status New     PT LONG TERM GOAL #3   Title Pt to be able to single leg stance on her right leg for 10 seconds to reduce risk of falling    Time 6   Period Weeks   Status New     PT LONG TERM GOAL #4   Title Pt Rt LE strength to be increased at least one grade to allow pt to come sit to stand with equal weight bearing on her LE    Time 6   Period Weeks   Status New     PT LONG TERM GOAL #5   Title Pt right leg pain to be no greater than a 10 /10 to allow her to tolerate being up standing/walking  for an hour to complete household tasks.    Time 6   Period Weeks   Status New               Plan - 11/04/16 1540    Clinical Impression Statement Reviewed goals and copy of eval given.  Educated pt on importance of compliance wiht HEP for maximal benefits.  Session focus on improving gait mechanics with moderate cueing to improve mechanics especially with Rt LE as tendency to drag and keep knee straight with vast improvements following cueing.  Therex focus on functional strengthening with min  cueing for technique.  EOS pt reoprts no pain in thigh though does continue to c/o groin pain 10/10.     Rehab Potential Good   PT Frequency 2x / week   PT Duration 6 weeks   PT Treatment/Interventions ADLs/Self Care Home Management;Therapeutic activities;Therapeutic exercise;Balance training;Functional mobility training;Gait training;Stair training;Manual techniques;Patient/family education;Ultrasound;Other (comment)  laser   PT Next Visit Plan F/U with complaince iwth HEP.  Continue with gait training and progress CKC with rocker board, heel raises, forward and lateral step ups as well as SLS.  Progress as able.  May do manual or modalities to decrease pain.   PT Home Exercise Plan given: sitting LAQ and ankle dorsi/plantarflexion, bridge, bent SLR, hip abduction; prone knee flexion, SLR and functional squat.       Patient will benefit from skilled therapeutic intervention in order to improve the following deficits and impairments:  Abnormal gait, Decreased activity tolerance, Decreased balance, Decreased strength, Difficulty walking, Pain, Decreased mobility  Visit Diagnosis: Pain in right leg  Difficulty in walking, not elsewhere classified  Other abnormalities of gait and mobility     Problem List Patient Active Problem List   Diagnosis Date Noted  . Right sided weakness 10/13/2016  . Gait abnormality 10/13/2016  . Esophageal dysphagia   .  Former smoker 07/15/2016  . Abnormal PFT 07/15/2016  . Vitamin D deficiency 07/15/2016  . Gastroesophageal reflux disease 07/15/2016  . Mood change ([place]) 07/15/2016  . Paresthesia 07/15/2016  . Right arm pain 07/15/2016  . Encounter for health maintenance examination in adult 04/15/2016  . Chronic diastolic heart failure ([place]) 10/22/2015  . Deficiency anemia 10/05/2015  . Dyspnea 10/05/2015  . Hyperlipidemia   . Diabetes mellitus with complication ([place]) 52/17/4715  . Coronary artery disease involving native coronary artery with angina  pectoris ([place])   . Neuropraxia of right median nerve   . Tobacco abuse 06/21/2015  . Obesity 06/21/2015  . Abnormal thyroid function test 06/21/2015  . History of non-ST elevation myocardial infarction (NSTEMI)    Ihor Austin, LPTA; CBIS [phone]  Aldona Lento 11/04/2016, [place], [place], [address] Phone: [phone]   Fax:  [phone]  Name: LIZETH BENCOSME MRN: [id number] Date of Birth: 05/21/1980

## 2016-11-06 ENCOUNTER — Ambulatory Visit (HOSPITAL_COMMUNITY): Payer: 59 | Admitting: Physical Therapy

## 2016-11-06 DIAGNOSIS — M79604 Pain in right leg: Secondary | ICD-10-CM

## 2016-11-06 DIAGNOSIS — R262 Difficulty in walking, not elsewhere classified: Secondary | ICD-10-CM | POA: Diagnosis not present

## 2016-11-06 DIAGNOSIS — R2689 Other abnormalities of gait and mobility: Secondary | ICD-10-CM

## 2016-11-06 NOTE — Patient Instructions (Signed)
Functional Quadriceps: Sit to Stand    Sit on edge of chair, feet flat on floor. Stand upright, extending knees fully. Repeat _10___ times per set. Do _2___ sessions per day.    

## 2016-11-06 NOTE — Therapy (Signed)
[place], [place], [address] Phone: [phone]   Fax:  [phone]  Physical Therapy Treatment  Patient Details  Name: Jennifer Cervantes MRN: [id number] Date of Birth: [birth year] Referring Provider: Marcial Pacas  Encounter Date: 11/06/2016      PT End of Session - 11/06/16 1555    Visit Number 3   Number of Visits 12   Date for PT Re-Evaluation 11/22/16   Authorization Type UMR   Authorization - Visit Number 3   Authorization - Number of Visits 10   PT Start Time 3785   PT Stop Time 1554   PT Time Calculation (min) 39 min   Activity Tolerance Patient tolerated treatment well   Behavior During Therapy [facility] for tasks assessed/performed      Past Medical History:  Diagnosis Date  . CAD (coronary artery disease)    a. LHC on 06/21/15 which showed 75% occl mid-dist LCx, 40% occl mRCA, 99% mid-dist LAD s/p DES and 50% occl mLAD s/p DES (overlapping stents) and significant LV dysfunction with anteroapical HK; b. 06/2015 Myoview: EF 55-65%, no ischemia/infarct.  . CHF (congestive heart failure) ([place])   . GERD (gastroesophageal reflux disease)   . Hyperlipidemia   . Hypertension   . Hypothyroidism   . Myocardial stunning (HCC)    a. EF 45% on 2D ECHO on 06/21/15 and severe LV dysfunction on LV gram by cath. Repeat 2D ECHO with EF 55-60% and mild HK of the apicoseptal myocardium.  [name] Neuropraxia of right median nerve    a. suspected after right radial cath. will follow with neuro outpatient if does not resolve.   . NSTEMI (non-ST elevated myocardial infarction) [facility]) 05/2015   [facility]  . Obesity   . 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 05/2015 Following PCI (radial access)-->conservatively managed.  . Tobacco abuse   . Tricuspid regurgitation   . Type II diabetes mellitus ([place])    a. Type II  . Vitamin D deficiency     Past Surgical History:  Procedure Laterality Date  . [place] STUDY N/A 07/14/2016    Procedure: [place] STUDY;  Surgeon: Mauri Pole, MD;  [facility];  Service: Endoscopy;  Laterality: N/A;  . CARDIAC CATHETERIZATION N/A 06/21/2015   Procedure: Left Heart Cath and Coronary Angiography;  Surgeon: Belva Crome, MD; pLAD 99%, CFX 75%, RCA 40%, EF 35%   . CARDIAC CATHETERIZATION N/A 06/21/2015   Procedure: Coronary Stent Intervention;  Surgeon: Belva Crome, MD;  Promus Premier DES, 3.0 x 20 and 3.5 x 32 mm stents to the LAD  . CARDIAC CATHETERIZATION N/A 06/21/2015   Procedure: Intravascular Ultrasound/IVUS;  Surgeon: Belva Crome, MD;  Location: [place] CV LAB;  Service: Cardiovascular;  Laterality: N/A;  LAD  . CARDIAC CATHETERIZATION  06/22/2015   Procedure: Coronary/Graft Angiography;  Surgeon: Peter M Martinique, MD;  Location: [place] CV LAB;  Service: Cardiovascular;;  . CORONARY STENT INTERVENTION N/A 04/02/2016   Procedure: Coronary Stent Intervention;  Surgeon: Jettie Booze, MD;  Location: [place] CV LAB;  Service: Cardiovascular;  Laterality: N/A;  . ESOPHAGEAL MANOMETRY N/A 07/14/2016   Procedure: ESOPHAGEAL MANOMETRY (EM);  Surgeon: Mauri Pole, MD;  [facility];  Service: Endoscopy;  Laterality: N/A;  . LEFT HEART CATH AND CORONARY ANGIOGRAPHY N/A 04/02/2016   Procedure: Left Heart Cath and Coronary Angiography;  Surgeon: Jettie Booze, MD;  Location: [place]  CV LAB;  Service: Cardiovascular;  Laterality: N/A;  . LEFT HEART CATH AND CORONARY ANGIOGRAPHY N/A 07/17/2016   Procedure: Left Heart Cath and Coronary Angiography;  Surgeon: Leonie Man, MD;  Location: [place] CV LAB;  Service: Cardiovascular;  Laterality: N/A;    There were no vitals filed for this visit.                       [place] Adult PT Treatment/Exercise - 11/06/16 0001      Knee/Hip Exercises: Stretches   Hip Flexor Stretch Right;2 reps;20 seconds   Hip Flexor Stretch Limitations and groin stretch using 14"  step     Knee/Hip Exercises: Standing   Heel Raises 15 reps   Heel Raises Limitations Toe raises   Lateral Step Up Right;15 reps;Step Height: 4";Hand Hold: 1   Forward Step Up Right;15 reps;Step Height: 4";Hand Hold: 1   Functional Squat 15 reps   Functional Squat Limitations cueing for form   SLS 30" x 2 each without UE assist   Gait Training Heel to toe and flexion of ankle and knee to improve mechanics     Knee/Hip Exercises: Seated   Sit to Sand 10 reps;without UE support     Knee/Hip Exercises: Supine   Bridges 15 reps   Straight Leg Raises Right;15 reps                  PT Short Term Goals - 10/23/16 1214      PT SHORT TERM GOAL #1   Title Pt to be ambulating with a normal heel toe gait pattern to relieve stress off her his and low back    Time 3   Period Weeks   Status New   Target Date 11/13/16     PT SHORT TERM GOAL #2   Title Pt to be able to sit for an hour to be able to enjoy sitting for a meal    Time 3   Period Weeks   Status New     PT SHORT TERM GOAL #3   Title Pt to be able to stand for 15 minutes to be able to socialize    Time 3   Period Weeks   Status New     PT SHORT TERM GOAL #4   Title Pt to be able to walk for 15 mintues to be able to complete normal activites around her home.    Time 3   Period Weeks           PT Long Term Goals - 10/23/16 1217      PT LONG TERM GOAL #1   Title Pt to be able to sit for an hour and a half for traveling    Time 6   Period Weeks   Status New   Target Date 11/27/16     PT LONG TERM GOAL #2   Title Pt to be able to walk for 30 minutes to be able to complete short shopping trips    Time 6   Period Weeks   Status New     PT LONG TERM GOAL #3   Title Pt to be able to single leg stance on her right leg for 10 seconds to reduce risk of falling    Time 6   Period Weeks   Status New     PT LONG TERM GOAL #4   Title Pt Rt LE strength to be increased at least one grade  to allow pt to come  sit to stand with equal weight bearing on her LE    Time 6   Period Weeks   Status New     PT LONG TERM GOAL #5   Title Pt right leg pain to be no greater than a 10 /10 to allow her to tolerate being up standing/walking  for an hour to complete household tasks.    Time 6   Period Weeks   Status New               Plan - 11/06/16 1556    Clinical Impression Statement Pt with same complaints with added soreness in calfs.  Completed established therex with cues for form.  Added standing groin and hip flexor stretches with positive reaction from patinent, decreasing pain 2 levels after completing.  sit to stand and SLS added with ability to maintain for 30" but with noted muscullar fatique.   Attempted manual to Rt groin, however patient refused treatment as fear of being too painful.  Encouraged pateint to continue HEP with added stretches.     Rehab Potential Good   PT Frequency 2x / week   PT Duration 6 weeks   PT Treatment/Interventions ADLs/Self Care Home Management;Therapeutic activities;Therapeutic exercise;Balance training;Functional mobility training;Gait training;Stair training;Manual techniques;Patient/family education;Ultrasound;Other (comment)  laser   PT Next Visit Plan Continue with gait training and progress CKC.  Next session add vector stance.  Revisit manual or modalities to decrease pain if needed..   PT Home Exercise Plan given: sitting LAQ and ankle dorsi/plantarflexion, bridge, bent SLR, hip abduction; prone knee flexion, SLR and functional squat.  9/13: groin stretch, hip flexor stretch, sit to stand      Patient will benefit from skilled therapeutic intervention in order to improve the following deficits and impairments:  Abnormal gait, Decreased activity tolerance, Decreased balance, Decreased strength, Difficulty walking, Pain, Decreased mobility  Visit Diagnosis: Pain in right leg  Difficulty in walking, not elsewhere classified  Other abnormalities of  gait and mobility     Problem List Patient Active Problem List   Diagnosis Date Noted  . Right sided weakness 10/13/2016  . Gait abnormality 10/13/2016  . Esophageal dysphagia   . Former smoker 07/15/2016  . Abnormal PFT 07/15/2016  . Vitamin D deficiency 07/15/2016  . Gastroesophageal reflux disease 07/15/2016  . Mood change ([place]) 07/15/2016  . Paresthesia 07/15/2016  . Right arm pain 07/15/2016  . Encounter for health maintenance examination in adult 04/15/2016  . Chronic diastolic heart failure ([place]) 10/22/2015  . Deficiency anemia 10/05/2015  . Dyspnea 10/05/2015  . Hyperlipidemia   . Diabetes mellitus with complication ([place]) 16/11/9602  . Coronary artery disease involving native coronary artery with angina pectoris ([place])   . Neuropraxia of right median nerve   . Tobacco abuse 06/21/2015  . Obesity 06/21/2015  . Abnormal thyroid function test 06/21/2015  . History of non-ST elevation myocardial infarction (NSTEMI)    Teena Irani, PTA/CLT [phone]  Teena Irani 11/06/2016, 4:00 PM  [place] [address] [place], [place], [address] Phone: [phone]   Fax:  [phone]  Name: Jennifer Cervantes MRN: [id number] Date of Birth: [birth year]

## 2016-11-07 ENCOUNTER — Encounter (HOSPITAL_COMMUNITY): Payer: Self-pay | Admitting: Emergency Medicine

## 2016-11-07 ENCOUNTER — Emergency Department (HOSPITAL_COMMUNITY)
Admission: EM | Admit: 2016-11-07 | Discharge: 2016-11-07 | Disposition: A | Discharge status: Home / Self Care | Payer: 59 | Attending: Physician Assistant | Admitting: Physician Assistant

## 2016-11-07 DIAGNOSIS — Z794 Long term (current) use of insulin: Secondary | ICD-10-CM | POA: Insufficient documentation

## 2016-11-07 DIAGNOSIS — I5032 Chronic diastolic (congestive) heart failure: Secondary | ICD-10-CM | POA: Insufficient documentation

## 2016-11-07 DIAGNOSIS — Z7902 Long term (current) use of antithrombotics/antiplatelets: Secondary | ICD-10-CM | POA: Diagnosis not present

## 2016-11-07 DIAGNOSIS — Z87891 Personal history of nicotine dependence: Secondary | ICD-10-CM | POA: Insufficient documentation

## 2016-11-07 DIAGNOSIS — M542 Cervicalgia: Secondary | ICD-10-CM | POA: Diagnosis present

## 2016-11-07 DIAGNOSIS — I2510 Atherosclerotic heart disease of native coronary artery without angina pectoris: Secondary | ICD-10-CM | POA: Diagnosis not present

## 2016-11-07 DIAGNOSIS — Z79899 Other long term (current) drug therapy: Secondary | ICD-10-CM | POA: Diagnosis not present

## 2016-11-07 DIAGNOSIS — E119 Type 2 diabetes mellitus without complications: Secondary | ICD-10-CM | POA: Diagnosis not present

## 2016-11-07 DIAGNOSIS — Z7982 Long term (current) use of aspirin: Secondary | ICD-10-CM | POA: Insufficient documentation

## 2016-11-07 DIAGNOSIS — I110 Hypertensive heart disease with heart failure: Secondary | ICD-10-CM | POA: Diagnosis not present

## 2016-11-07 DIAGNOSIS — M436 Torticollis: Secondary | ICD-10-CM | POA: Diagnosis not present

## 2016-11-07 DIAGNOSIS — E039 Hypothyroidism, unspecified: Secondary | ICD-10-CM | POA: Insufficient documentation

## 2016-11-07 MED ORDER — PROCHLORPERAZINE EDISYLATE 5 MG/ML IJ SOLN
10.0000 mg | Freq: Once | INTRAMUSCULAR | Status: AC
  Administered 2016-11-07: 10 mg via INTRAVENOUS
  Filled 2016-11-07: qty 2

## 2016-11-07 MED ORDER — DIPHENHYDRAMINE HCL 50 MG/ML IJ SOLN
25.0000 mg | Freq: Once | INTRAMUSCULAR | Status: AC
  Administered 2016-11-07: 25 mg via INTRAVENOUS
  Filled 2016-11-07: qty 1

## 2016-11-07 MED ORDER — SODIUM CHLORIDE 0.9 % IV BOLUS (SEPSIS)
1000.0000 mL | Freq: Once | INTRAVENOUS | Status: AC
  Administered 2016-11-07: 1000 mL via INTRAVENOUS

## 2016-11-07 MED ORDER — CYCLOBENZAPRINE HCL 10 MG PO TABS: 10.0000 mg | ORAL_TABLET | Freq: Two times a day (BID) | ORAL | 0 refills | Status: DC | PRN

## 2016-11-07 NOTE — ED Triage Notes (Signed)
Pt reports she began to have a HA with forehead pain 2 days ago. Pain is worsened by light and noise. Pt has had n/v at home, but this is not new.  Pt also reports L lateral neck pain. Denies any recent injuries.

## 2016-11-07 NOTE — ED Notes (Signed)
Wendall Mola, RN at bedside attempt US guided IV.

## 2016-11-07 NOTE — ED Provider Notes (Signed)
[facility] DEPT Provider Note   CSN: [id number] Arrival date & time: 11/07/16  1008     History   Chief Complaint Chief Complaint  Patient presents with  . Headache  . Neck Pain    HPI Jennifer Cervantes is a 36 y.o. female.  HPI   Patient is a 36 year old female presenting with headache and left neck pain. Patient reports that patient had mild headache. She also woke up and unable to move her neck to left. She is able to move it to the right. Patient has not had any fever. No feelings of illness. Patient reports that noise and light makes the headache worse.  Patient is A complicated past medical history for her age including CAD, stents, hypertension, hyperlipidemia, diabetes.  Past Medical History:  Diagnosis Date  . CAD (coronary artery disease)    a. LHC on 06/21/15 which showed 75% occl mid-dist LCx, 40% occl mRCA, 99% mid-dist LAD s/p DES and 50% occl mLAD s/p DES (overlapping stents) and significant LV dysfunction with anteroapical HK; b. 06/2015 Myoview: EF 55-65%, no ischemia/infarct.  . CHF (congestive heart failure) ([place])   . GERD (gastroesophageal reflux disease)   . Hyperlipidemia   . Hypertension   . Hypothyroidism   . Myocardial stunning (HCC)    a. EF 45% on 2D ECHO on 06/21/15 and severe LV dysfunction on LV gram by cath. Repeat 2D ECHO with EF 55-60% and mild HK of the apicoseptal myocardium.  [name] Neuropraxia of right median nerve    a. suspected after right radial cath. will follow with neuro outpatient if does not resolve.   . NSTEMI (non-ST elevated myocardial infarction) [facility]) 05/2015   [facility]  . Obesity   . 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 05/2015 Following PCI (radial access)-->conservatively managed.  . Tobacco abuse   . Tricuspid regurgitation   . Type II diabetes mellitus ([place])    a. Type II  . Vitamin D deficiency     Patient Active Problem List   Diagnosis Date Noted  . Right sided weakness 10/13/2016  . Gait abnormality  10/13/2016  . Esophageal dysphagia   . Former smoker 07/15/2016  . Abnormal PFT 07/15/2016  . Vitamin D deficiency 07/15/2016  . Gastroesophageal reflux disease 07/15/2016  . Mood change ([place]) 07/15/2016  . Paresthesia 07/15/2016  . Right arm pain 07/15/2016  . Encounter for health maintenance examination in adult 04/15/2016  . Chronic diastolic heart failure ([place]) 10/22/2015  . Deficiency anemia 10/05/2015  . Dyspnea 10/05/2015  . Hyperlipidemia   . Diabetes mellitus with complication ([place]) 64/33/2951  . Coronary artery disease involving native coronary artery with angina pectoris ([place])   . Neuropraxia of right median nerve   . Tobacco abuse 06/21/2015  . Obesity 06/21/2015  . Abnormal thyroid function test 06/21/2015  . History of non-ST elevation myocardial infarction (NSTEMI)     Past Surgical History:  Procedure Laterality Date  . [place] STUDY N/A 07/14/2016   Procedure: [place] STUDY;  Surgeon: Mauri Pole, MD;  [facility];  Service: Endoscopy;  Laterality: N/A;  . CARDIAC CATHETERIZATION N/A 06/21/2015   Procedure: Left Heart Cath and Coronary Angiography;  Surgeon: Belva Crome, MD; pLAD 99%, CFX 75%, RCA 40%, EF 35%   . CARDIAC CATHETERIZATION N/A 06/21/2015   Procedure: Coronary Stent Intervention;  Surgeon: Belva Crome, MD;  Promus Premier DES, 3.0 x 20 and 3.5 x 32 mm stents to the LAD  .  CARDIAC CATHETERIZATION N/A 06/21/2015   Procedure: Intravascular Ultrasound/IVUS;  Surgeon: Belva Crome, MD;  Location: [place] CV LAB;  Service: Cardiovascular;  Laterality: N/A;  LAD  . CARDIAC CATHETERIZATION  06/22/2015   Procedure: Coronary/Graft Angiography;  Surgeon: Peter M Martinique, MD;  Location: [place] [facility] LAB;  Service: Cardiovascular;;  . CORONARY STENT INTERVENTION N/A 04/02/2016   Procedure: Coronary Stent Intervention;  Surgeon: Jettie Booze, MD;  Location: [place] [facility];  Service: Cardiovascular;  Laterality: N/A;  .  ESOPHAGEAL MANOMETRY N/A 07/14/2016   Procedure: ESOPHAGEAL MANOMETRY (EM);  Surgeon: Mauri Pole, MD;  [facility];  Service: Endoscopy;  Laterality: N/A;  . LEFT HEART CATH AND CORONARY ANGIOGRAPHY N/A 04/02/2016   Procedure: Left Heart Cath and Coronary Angiography;  Surgeon: Jettie Booze, MD;  Location: [place] CV LAB;  Service: Cardiovascular;  Laterality: N/A;  . LEFT HEART CATH AND CORONARY ANGIOGRAPHY N/A 07/17/2016   Procedure: Left Heart Cath and Coronary Angiography;  Surgeon: Leonie Man, MD;  Location: [place] CV LAB;  Service: Cardiovascular;  Laterality: N/A;    OB History    No data available       Home Medications    Prior to Admission medications   Medication Sig Start Date End Date Taking? Authorizing Provider  acetaminophen (TYLENOL) 500 MG tablet Take 500 mg by mouth every 6 (six) hours as needed for headache (pain).   Yes [provider]  aspirin 81 MG EC tablet Take 1 tablet (81 mg total) by mouth daily. 04/16/16  Yes Tysinger, Camelia Eng, PA-C  atorvastatin (LIPITOR) 40 MG tablet Take 1 tablet (40 mg total) by mouth daily at 6 PM. 04/16/16  Yes Tysinger, Camelia Eng, PA-C  Cholecalciferol (VITAMIN D) 2000 units tablet Take 1 tablet (2,000 Units total) by mouth daily. 07/15/16  Yes Tysinger, Camelia Eng, PA-C  clopidogrel (PLAVIX) 75 MG tablet Take 75 mg by mouth 2 (two) times daily.   Yes [provider]  ferrous gluconate (FERGON) 324 MG tablet Take 1 tablet (324 mg total) by mouth daily with breakfast. 04/16/16  Yes Tysinger, Camelia Eng, PA-C  fluticasone furoate-vilanterol (BREO ELLIPTA) 200-25 MCG/INH AEPB Inhale 1 puff into the lungs daily. 07/15/16  Yes Tysinger, Camelia Eng, PA-C  Insulin Glargine (LANTUS SOLOSTAR) 100 UNIT/ML Solostar Pen Inject 10 Units into the skin daily at 10 pm. 04/16/16  Yes Tysinger, Camelia Eng, PA-C  linaclotide [facility]) 145 MCG CAPS capsule Take 1 capsule (145 mcg total) by mouth daily before breakfast.  09/03/16  Yes Armbruster, Carlota Raspberry, MD  lisinopril-hydrochlorothiazide (PRINZIDE,ZESTORETIC) 20-25 MG tablet Take 0.5 tablets by mouth daily. 10/06/16  Yes Belva Crome, MD  metFORMIN (GLUCOPHAGE) 1000 MG tablet Take 1 tablet (1,000 mg total) by mouth 2 (two) times daily with a meal. 04/16/16  Yes Tysinger, Camelia Eng, PA-C  metoprolol tartrate (LOPRESSOR) 100 MG tablet Take 1 tablet (100 mg total) by mouth 2 (two) times daily. Patient taking differently: Take 50 mg by mouth 2 (two) times daily.  09/29/16 12/28/16 Yes Weaver, Scott T, PA-C  nicotine (NICODERM CQ - DOSED IN MG/24 HOURS) 21 mg/24hr patch Place 1 patch (21 mg total) onto the skin daily. 08/16/15  Yes Tysinger, Camelia Eng, PA-C  nitroGLYCERIN (NITROSTAT) 0.4 MG SL tablet Place 1 tablet (0.4 mg total) under the tongue every 5 (five) minutes as needed for chest pain. 06/04/16  Yes Belva Crome, MD  ondansetron (ZOFRAN) 4 MG tablet Take 4 mg by mouth  every 6 (six) hours as needed for nausea.  07/28/16  Yes [provider]  oxyCODONE (OXY IR/ROXICODONE) 5 MG immediate release tablet Take 5 mg by mouth every 4 (four) hours as needed for breakthrough pain.   Yes [provider]  pantoprazole (PROTONIX) 40 MG tablet Take 40 mg by mouth 2 (two) times daily.   Yes [provider]  pregabalin (LYRICA) 50 MG capsule Take 1 capsule (50 mg total) by mouth 3 (three) times daily. 10/31/16  Yes Marcial Pacas, MD  promethazine (PHENERGAN) 12.5 MG tablet Take 1 tablet (12.5 mg total) by mouth every 6 (six) hours as needed for nausea or vomiting. 08/06/16  Yes Tysinger, Camelia Eng, PA-C  gabapentin (NEURONTIN) 300 MG capsule Take 1 capsule (300 mg total) by mouth 3 (three) times daily. Patient not taking: Reported on 11/07/2016 10/13/16   Marcial Pacas, MD    Family History Family History  Problem Relation Age of Onset  . Diabetes Mother   . Healthy Father   . Healthy Sister   . Healthy Brother   . Diabetes Maternal Grandmother   . Heart disease  Maternal Grandmother   . Diabetes Maternal Aunt        x 2  . Heart attack Neg Hx   . Heart failure Neg Hx   . Stroke Neg Hx   . Hyperlipidemia Neg Hx   . Hypertension Neg Hx   . Stomach cancer Neg Hx   . Colon cancer Neg Hx     Social History Social History  Substance Use Topics  . Smoking status: Former Smoker    Packs/day: 0.50    Years: 23.00    Types: Cigarettes    Quit date: 04/01/2016  . Smokeless tobacco: Never Used  . Alcohol use No     Allergies   Patient has no known allergies.   Review of Systems Review of Systems  Constitutional: Negative for activity change, fatigue and fever.  HENT: Negative for congestion, ear discharge, sinus pain and sinus pressure.   Respiratory: Negative for shortness of breath.   Cardiovascular: Negative for chest pain.  Gastrointestinal: Negative for abdominal pain.     Physical Exam Updated Vital Signs BP (!) 144/100 (BP Location: Left Arm)   Pulse (!) 106   Temp 97.7 F (36.5 C) (Oral)   Resp 17   SpO2 100%   Physical Exam  Constitutional: She is oriented to person, place, and time. She appears well-developed and well-nourished.  Very well-appearing.  HENT:  Head: Normocephalic and atraumatic.  Eyes: Right eye exhibits no discharge. Left eye exhibits no discharge.  Neck:  The patient draining from left trapezius up and to where the insertion of the trapezius is on occiput. No pain to the right side. No pain posteriorly. Patient has no pain in the neck when raising either legs.  Cardiovascular: Normal rate, regular rhythm and normal heart sounds.   No murmur heard. Pulmonary/Chest: Effort normal and breath sounds normal. No stridor. She has no wheezes. She has no rales.  Abdominal: Soft. She exhibits no distension. There is no tenderness.  Neurological: She is oriented to person, place, and time.  Skin: Skin is warm and dry. She is not diaphoretic.  Psychiatric: She has a normal mood and affect.  Nursing note and  vitals reviewed.    ED Treatments / Results  Labs (all labs ordered are listed, but only abnormal results are displayed) Labs Reviewed - No data to display  EKG  EKG Interpretation None  Radiology No results found.  Procedures Procedures (including critical care time)  Medications Ordered in ED Medications  sodium chloride 0.9 % bolus 1,000 mL (not administered)  prochlorperazine (COMPAZINE) injection 10 mg (not administered)  diphenhydrAMINE (BENADRYL) injection 25 mg (not administered)     Initial Impression / Assessment and Plan / ED Course  I have reviewed the triage vital signs and the nursing notes.  Pertinent labs & imaging results that were available during my care of the patient were reviewed by me and considered in my medical decision making (see chart for details).     Patient is a 36 year old female presenting with headache and left neck pain. Patient reports that patient had mild headache. She also woke up and unable to move her neck to left. She is able to move it to the right. Patient has not had any fever. No feelings of illness. Patient reports that noise and light makes the headache worse.  Patient is A complicated past medical history for her age including CAD, stents, hypertension, hyperlipidemia, diabetes.  11:28 AM Patient's very well-appearing, will treat for migraine headache and give patient muscle relaxants take home. This is torticollis with headache. Do not suspect meningitis given she is very well-appearing, afebrile. She in fact here because her mother is being seen in the other in the hospital.  Give her the option of by mouth medication versus IV. Patient will attempt IV 1 and then would like to just oral . 12:23 PM Patient got IV doses but now her vomiting [facility] of patient like to leave. We'll let her leave. We discussed the risks of leaving before care is complete. She moving her neck with no problem now.  Vitals  normal, patient ambulatory and  taking by mouth at  discharge.  Final Clinical Impressions(s) / ED Diagnoses   Final diagnoses:  None    New Prescriptions New Prescriptions   No medications on file     Macarthur Critchley, MD 11/07/16 1224

## 2016-11-07 NOTE — Discharge Instructions (Signed)
USe muscle relaxants, heat and ice to help the muscle spasm. Return to PCP in the next 2 days if not completely improved. Return with fever, illness, or other concerns.

## 2016-11-07 NOTE — ED Notes (Signed)
Went to assess pt pain post intervention. Visualized bloody guaze, band aid wrapper on bed, and gown on bed. Pt not seen throughout department. Per Lanelle Bal RN pt verbalizes needing to leave to get her mother. This writer attempted to call pt. Pt did not answer. Manuela Schwartz charge RN aware.  Pt left without discharge instructions.

## 2016-11-10 ENCOUNTER — Telehealth: Payer: Self-pay | Admitting: Neurology

## 2016-11-10 NOTE — Telephone Encounter (Signed)
Patient has pending appt on 11/12/16.  States her symptoms are improving with Lyrica and PT.  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

## 2016-11-10 NOTE — Telephone Encounter (Signed)
Patient calling to discuss when she can be released to go back to work.

## 2016-11-11 ENCOUNTER — Ambulatory Visit (HOSPITAL_COMMUNITY): Payer: 59

## 2016-11-11 DIAGNOSIS — R262 Difficulty in walking, not elsewhere classified: Secondary | ICD-10-CM | POA: Diagnosis not present

## 2016-11-11 DIAGNOSIS — R2689 Other abnormalities of gait and mobility: Secondary | ICD-10-CM

## 2016-11-11 DIAGNOSIS — M79604 Pain in right leg: Secondary | ICD-10-CM

## 2016-11-11 NOTE — Therapy (Addendum)
[facility], [place], [address] Phone: [phone]   Fax:  [phone]  Physical Therapy Treatment  Patient Details  Name: Jennifer Cervantes MRN: [id number] Date of Birth: [birth year] Referring Provider: Judieth Keens  Encounter Date: 11/11/2016      PT End of Session - 11/11/16 1522    Visit Number 4   Number of Visits 12   Date for PT Re-Evaluation 11/22/16   Authorization Type UMR   Authorization - Visit Number 4   Authorization - Number of Visits 10   PT Start Time 1518   PT Stop Time 1558   PT Time Calculation (min) 40 min   Activity Tolerance Patient tolerated treatment well;Patient limited by pain   Behavior During Therapy [facility] for tasks assessed/performed      Past Medical History:  Diagnosis Date  . CAD (coronary artery disease)    a. LHC on 06/21/15 which showed 75% occl mid-dist LCx, 40% occl mRCA, 99% mid-dist LAD s/p DES and 50% occl mLAD s/p DES (overlapping stents) and significant LV dysfunction with anteroapical HK; b. 06/2015 Myoview: EF 55-65%, no ischemia/infarct.  . CHF (congestive heart failure) ([place])   . GERD (gastroesophageal reflux disease)   . Hyperlipidemia   . Hypertension   . Hypothyroidism   . Myocardial stunning (HCC)    a. EF 45% on 2D ECHO on 06/21/15 and severe LV dysfunction on LV gram by cath. Repeat 2D ECHO with EF 55-60% and mild HK of the apicoseptal myocardium.  [name] Neuropraxia of right median nerve    a. suspected after right radial cath. will follow with neuro outpatient if does not resolve.   . NSTEMI (non-ST elevated myocardial infarction) [facility]) 05/2015   [facility]  . Obesity   . 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 05/2015 Following PCI (radial access)-->conservatively managed.  . Tobacco abuse   . Tricuspid regurgitation   . Type II diabetes mellitus ([place])    a. Type II  . Vitamin D deficiency     Past Surgical History:  Procedure Laterality Date  . [place] STUDY N/A 07/14/2016   Procedure: [place] STUDY;  Surgeon: Mauri Pole, MD;  [facility];  Service: Endoscopy;  Laterality: N/A;  . CARDIAC CATHETERIZATION N/A 06/21/2015   Procedure: Left Heart Cath and Coronary Angiography;  Surgeon: Belva Crome, MD; pLAD 99%, CFX 75%, RCA 40%, EF 35%   . CARDIAC CATHETERIZATION N/A 06/21/2015   Procedure: Coronary Stent Intervention;  Surgeon: Belva Crome, MD;  Promus Premier DES, 3.0 x 20 and 3.5 x 32 mm stents to the LAD  . CARDIAC CATHETERIZATION N/A 06/21/2015   Procedure: Intravascular Ultrasound/IVUS;  Surgeon: Belva Crome, MD;  Location: [place] [facility] LAB;  Service: Cardiovascular;  Laterality: N/A;  LAD  . CARDIAC CATHETERIZATION  06/22/2015   Procedure: Coronary/Graft Angiography;  Surgeon: Peter M Martinique, MD;  Location: [place] [facility];  Service: Cardiovascular;;  . CORONARY STENT INTERVENTION N/A 04/02/2016   Procedure: Coronary Stent Intervention;  Surgeon: Jettie Booze, MD;  Location: [place] [facility] LAB;  Service: Cardiovascular;  Laterality: N/A;  . ESOPHAGEAL MANOMETRY N/A 07/14/2016   Procedure: ESOPHAGEAL MANOMETRY (EM);  Surgeon: Mauri Pole, MD;  [facility];  Service: Endoscopy;  Laterality: N/A;  . LEFT HEART CATH AND CORONARY ANGIOGRAPHY N/A 04/02/2016   Procedure: Left Heart Cath and Coronary Angiography;  Surgeon: Jettie Booze, MD;  Location: [place] [facility] LAB;  Service: Cardiovascular;  Laterality: N/A;  . LEFT HEART CATH AND CORONARY ANGIOGRAPHY N/A 07/17/2016   Procedure: Left Heart Cath and Coronary Angiography;  Surgeon: Leonie Man, MD;  Location: [place] [facility] LAB;  Service: Cardiovascular;  Laterality: N/A;    There were no vitals filed for this visit.      Subjective Assessment - 11/11/16 1514    Subjective Pt continues to report high Rt groin are, pain scale 8/10.  Reports compliance with HEP   Pertinent History DM, CAD, MI 2017 multiple heart  catheerterizations with problems in her forearm whith a brachial catherization.     Patient Stated Goals less pain    Currently in Pain? Yes   Pain Score 8    Pain Location Groin   Pain Orientation Right   Pain Descriptors / Indicators Sore;Aching;Throbbing   Pain Type Chronic pain   Pain Onset More than a month ago   Pain Frequency Constant   Aggravating Factors  weight bearing    Pain Relieving Factors nothing so far   Effect of Pain on Daily Activities increases            [facility] PT Assessment - 11/11/16 0001      Assessment   Medical Diagnosis Rt inguinal pain s/p cardiac cath   Referring Provider Judieth Keens   Onset Date/Surgical Date 07/17/16   Next MD Visit 11/02/2016   Prior Therapy none     Precautions   Precautions None                     [facility] Adult PT Treatment/Exercise - 11/11/16 0001      Knee/Hip Exercises: Stretches   Hip Flexor Stretch Right;3 reps;30 seconds   Hip Flexor Stretch Limitations supine Thomas stretch     Knee/Hip Exercises: Standing   Heel Raises 20 reps   Heel Raises Limitations Toe raises   Lateral Step Up Right;15 reps;Hand Hold: 1;Step Height: 6"   Forward Step Up Right;15 reps;Hand Hold: 1;Step Height: 6"   Functional Squat 15 reps   Functional Squat Limitations cueing for form   Rocker Board 2 minutes   Rocker Board Limitations Df/Pf and Rt/Lt   SLS Lt 60", Rt 38" max of 3   SLS with Vectors 3x5"     Knee/Hip Exercises: Seated   Sit to Sand 10 reps;without UE support     Manual Therapy   Manual Therapy Soft tissue mobilization   Manual therapy comments Manual complete separate than rest of tx   Soft tissue mobilization Supine position with LE elevated, STM to Rt quad and anterior hip                  PT Short Term Goals - 10/23/16 1214      PT SHORT TERM GOAL #1   Title Pt to be ambulating with a normal heel toe gait pattern to relieve stress off her his and low back    Time 3   Period Weeks    Status New   [place] Date 11/13/16     PT SHORT TERM GOAL #2   Title Pt to be able to sit for an hour to be able to enjoy sitting for a meal    Time 3   Period Weeks   Status New     PT SHORT TERM GOAL #3   Title Pt to be able to stand for 15 minutes to be able to socialize  Time 3   Period Weeks   Status New     PT SHORT TERM GOAL #4   Title Pt to be able to walk for 15 mintues to be able to complete normal activites around her home.    Time 3   Period Weeks           PT Long Term Goals - 10/23/16 1217      PT LONG TERM GOAL #1   Title Pt to be able to sit for an hour and a half for traveling    Time 6   Period Weeks   Status New   [place] Date 11/27/16     PT LONG TERM GOAL #2   Title Pt to be able to walk for 30 minutes to be able to complete short shopping trips    Time 6   Period Weeks   Status New     PT LONG TERM GOAL #3   Title Pt to be able to single leg stance on her right leg for 10 seconds to reduce risk of falling    Time 6   Period Weeks   Status New     PT LONG TERM GOAL #4   Title Pt Rt LE strength to be increased at least one grade to allow pt to come sit to stand with equal weight bearing on her LE    Time 6   Period Weeks   Status New     PT LONG TERM GOAL #5   Title Pt right leg pain to be no greater than a 10 /10 to allow her to tolerate being up standing/walking  for an hour to complete household tasks.    Time 6   Period Weeks   Status New               Plan - 11/11/16 1602    Clinical Impression Statement Pt presents with improved gait mechanics with minimal cueing reuqired though does continue to ambulate stand with excessive ER Bil hips.  Session focus on functional strengthening and stretches to address pain control.  Added vector stance to improve stability.  Pt reports pain reduced folllowing stretches.  EOS with manual soft tissue mobilization to address tightness in quad and anterior hip.   Rehab Potential Good   PT  Frequency 2x / week   PT Duration 6 weeks   PT Treatment/Interventions ADLs/Self Care Home Management;Therapeutic activities;Therapeutic exercise;Balance training;Functional mobility training;Gait training;Stair training;Manual techniques;Patient/family education;Ultrasound;Other (comment)   PT Next Visit Plan F/u on relief following manual this session.  Continue with gait training and stretches for pain control.  Continue progressing CKC as able.     PT Home Exercise Plan given: sitting LAQ and ankle dorsi/plantarflexion, bridge, bent SLR, hip abduction; prone knee flexion, SLR and functional squat.  9/13: groin stretch, hip flexor stretch, sit to stand      Patient will benefit from skilled therapeutic intervention in order to improve the following deficits and impairments:  Abnormal gait, Decreased activity tolerance, Decreased balance, Decreased strength, Difficulty walking, Pain, Decreased mobility  Visit Diagnosis: Pain in right leg  Difficulty in walking, not elsewhere classified  Other abnormalities of gait and mobility     Problem List Patient Active Problem List   Diagnosis Date Noted  . Right sided weakness 10/13/2016  . Gait abnormality 10/13/2016  . Esophageal dysphagia   . Former smoker 07/15/2016  . Abnormal PFT 07/15/2016  . Vitamin D deficiency 07/15/2016  . Gastroesophageal reflux disease  07/15/2016  . Mood change ([place]) 07/15/2016  . Paresthesia 07/15/2016  . Right arm pain 07/15/2016  . Encounter for health maintenance examination in adult 04/15/2016  . Chronic diastolic heart failure ([place]) 10/22/2015  . Deficiency anemia 10/05/2015  . Dyspnea 10/05/2015  . Hyperlipidemia   . Diabetes mellitus with complication ([place]) 68/12/5724  . Coronary artery disease involving native coronary artery with angina pectoris ([place])   . Neuropraxia of right median nerve   . Tobacco abuse 06/21/2015  . Obesity 06/21/2015  . Abnormal thyroid function test 06/21/2015  .  History of non-ST elevation myocardial infarction (NSTEMI)    Ihor Austin, LPTA; CBIS [phone]  Aldona Lento 11/11/2016, 4:30 PM  [facility] [address] [place], [place], [address] Phone: [phone]   Fax:  [phone]  Name: FAYLINN SCHWENN MRN: [id number] Date of Birth: Jul 09, 1980    11/24/2017  PHYSICAL THERAPY DISCHARGE SUMMARY  Visits from Start of Care: 4  Current functional level related to goals / functional outcomes: Limited improvement    Remaining deficits: High pain level    Education / Equipment: HEP Plan: Patient agrees to discharge.  Patient goals were not met. Patient is being discharged due to not returning since the last visit.  ?????    Rayetta Humphrey, [place] CLT [phone]

## 2016-11-12 ENCOUNTER — Ambulatory Visit (INDEPENDENT_AMBULATORY_CARE_PROVIDER_SITE_OTHER): Payer: 59 | Admitting: Neurology

## 2016-11-12 ENCOUNTER — Encounter: Payer: Self-pay | Admitting: Neurology

## 2016-11-12 VITALS — BP 111/70 | HR 88 | Ht 69.0 in | Wt 227.0 lb

## 2016-11-12 DIAGNOSIS — R202 Paresthesia of skin: Secondary | ICD-10-CM | POA: Diagnosis not present

## 2016-11-12 NOTE — Progress Notes (Signed)
PATIENT: Jennifer Cervantes DOB: 1980-09-28  Chief Complaint  Patient presents with  . Gait Abnormality    Feels her symptoms are improving with Lyrica and PT.  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     HISTORICAL  Jennifer Cervantes is a 36 years old right-handed female , seen in refer by  Cardiologist Dr. Daneen Schick for evaluation of right leg and groin pain, initial evaluation was October 13 2016.  I reviewed and summarized referring note, she had a history of diabetes, was admitted to the hospital in April 2017 complains of chest pain, she underwent coronary artery catheterization through the right brachial artery access.  She did develop significant swelling of the right forearm and hand postprocedure, she also reported numbness, weakness of right hand, and discomfort, difficulty using her right hand, but she complained of ongoing paresthesias and subjective weakness of her right hand, was subsequently evaluated by my colleague Dr. Jannifer Franklin in May 2017, EMG nerve conduction study on August 29 2015 was reported normal, during the visit, Dr. Jannifer Franklin also documented that her right hand paresthesia and weakness has nearly completely cleared.  Patient is referred back by her cardiologist this time following her cardiac catheterization in May 2018 through right femoral artery, she complains of persistent discomfort and pain at right groin area  She had cardiac catheter on Jul 17 2016 by Dr. Glenetta Hew, there was minimum changes from post PCI angiographic findings from February 2018, no new lesions to explain her complains of chest pain, consider spasm versus not anginal chest pain   patient complains that since her recent cardiac catheter in May 2018, she had a persistent right groin area discomfort, tenderness upon deep palpitation, right leg felt numb, occasionally right leg give out underneath her,  She already had Doppler study of right groin on Jul 17 2016, artery and veins were  patent, there were noted multiple enlarged lympha nodes.  She also complains of passing out episode, she described lightheadedness when getting up from seated position, in early August 2018, when she got up using bathroom after prolonged sitting, she felt lightheadedness, dizzy, it can happen anytime of the day, but usually related to sudden positional change getting up quickly from seated position.  She reported to evaluation at the [facility] there was no significant abnormality found.  Lab, A1c 7.3, today patient also complains of persistent right arm and the hand paresthesia and weakness reported this has been ongoing since April 2017, despite chart has document that her symptoms has nearly completely resolved previously.  Update October 31 2016: She returned for electrodiagnostic study today, which is normal, there is no evidence of right upper lower extremity neuropathy, right cervical radiculopathy, or right lumbosacral radiculopathy.  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 acetaminophen (TYLENOL) 500 MG tablet Take 500 mg by mouth  every 6 (six) hours as needed for headache (pain).    [name] aspirin 81 MG EC tablet Take 1 tablet (81 mg total) by mouth daily. 90 tablet 3  . atorvastatin (LIPITOR) 40 MG tablet  Take 1 tablet (40 mg total) by mouth daily at 6 PM. 90 tablet 3  . Cholecalciferol (VITAMIN D) 2000 units tablet Take 1 tablet (2,000 Units total) by mouth daily. 90 tablet 3  . clopidogrel (PLAVIX) 75 MG tablet Take 75 mg by mouth 2 (two) times daily.    . cyclobenzaprine (FLEXERIL) 10 MG tablet Take 1 tablet (10 mg total) by mouth 2 (two) times daily as needed for muscle spasms. 10 tablet 0  . ferrous gluconate (FERGON) 324 MG tablet Take 1 tablet (324 mg total) by mouth daily with breakfast. 90 tablet 1  . fluticasone furoate-vilanterol (BREO ELLIPTA) 200-25 MCG/INH AEPB Inhale 1 puff into the lungs daily. 28 each 0  . gabapentin (NEURONTIN) 300 MG capsule Take 1 capsule (300 mg total) by mouth 3 (three) times daily. 90 capsule 11  . Insulin Glargine (LANTUS SOLOSTAR) 100 UNIT/ML Solostar Pen Inject 10 Units into the skin daily at 10 pm. 5 pen 5  . linaclotide (LINZESS) 145 MCG CAPS capsule Take 1 capsule (145 mcg total) by mouth daily before breakfast. 90 capsule 1  . lisinopril-hydrochlorothiazide (PRINZIDE,ZESTORETIC) 20-25 MG tablet Take 0.5 tablets by mouth daily. 45 tablet 3  . metFORMIN (GLUCOPHAGE) 1000 MG tablet Take 1 tablet (1,000 mg total) by mouth 2 (two) times daily with a meal. 180 tablet 3  . metoprolol tartrate (LOPRESSOR) 100 MG tablet Take 1 tablet (100 mg total) by mouth 2 (two) times daily. (Patient taking differently: Take 50 mg by mouth 2 (two) times daily. ) 180 tablet 3  . nicotine (NICODERM CQ - DOSED IN MG/24 HOURS) 21 mg/24hr patch Place 1 patch (21 mg total) onto the skin daily. 28 patch 0  . nitroGLYCERIN (NITROSTAT) 0.4 MG SL tablet Place 1 tablet (0.4 mg total) under the tongue every 5 (five) minutes as needed for chest pain. 25 tablet 3  . ondansetron (ZOFRAN) 4 MG tablet Take 4 mg by mouth every 6 (six) hours as needed for nausea.   0  . oxyCODONE (OXY IR/ROXICODONE) 5 MG immediate release tablet Take 5 mg by mouth every 4 (four) hours as needed for breakthrough  pain.    . pantoprazole (PROTONIX) 40 MG tablet Take 40 mg by mouth 2 (two) times daily.    . pregabalin (LYRICA) 50 MG capsule Take 1 capsule (50 mg total) by mouth 3 (three) times daily. 90 capsule 5  . promethazine (PHENERGAN) 12.5 MG tablet Take 1 tablet (12.5 mg total) by mouth every 6 (six) hours as needed for nausea or vomiting. 15 tablet 0   No current facility-administered medications for this visit.     PAST MEDICAL HISTORY: Past Medical History:  Diagnosis Date  . CAD (coronary artery disease)    a. LHC on 06/21/15 which showed 75% occl mid-dist LCx, 40% occl mRCA, 99% mid-dist LAD s/p DES and 50% occl mLAD s/p DES (overlapping stents) and significant LV dysfunction with anteroapical HK; b. 06/2015 Myoview: EF 55-65%, no ischemia/infarct.  . CHF (congestive heart failure) ([place])   . GERD (gastroesophageal reflux disease)   . Hyperlipidemia   . Hypertension   . Hypothyroidism   . Myocardial stunning (HCC)    a. EF 45% on 2D ECHO on 06/21/15 and severe LV dysfunction on LV gram by cath. Repeat  2D ECHO with EF 55-60% and mild HK of the apicoseptal myocardium.  [name] Neuropraxia of right median nerve    a. suspected after right radial cath. will follow with neuro outpatient if does not resolve.   . NSTEMI (non-ST elevated myocardial infarction) [facility]) 05/2015   [facility]  . Obesity   . 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 05/2015 Following PCI (radial access)-->conservatively managed.  . Tobacco abuse   . Tricuspid regurgitation   . Type II diabetes mellitus ([place])    a. Type II  . Vitamin D deficiency     PAST SURGICAL HISTORY: Past Surgical History:  Procedure Laterality Date  . [place] STUDY N/A 07/14/2016   Procedure: [employer] STUDY;  Surgeon: Mauri Pole, MD;  Location: [facility];  Service: Endoscopy;  Laterality: N/A;  . CARDIAC CATHETERIZATION N/A 06/21/2015   Procedure: Left Heart Cath and Coronary Angiography;  Surgeon: Belva Crome, MD; pLAD 99%,  CFX 75%, RCA 40%, EF 35%   . CARDIAC CATHETERIZATION N/A 06/21/2015   Procedure: Coronary Stent Intervention;  Surgeon: Belva Crome, MD;  Promus Premier DES, 3.0 x 20 and 3.5 x 32 mm stents to the LAD  . CARDIAC CATHETERIZATION N/A 06/21/2015   Procedure: Intravascular Ultrasound/IVUS;  Surgeon: Belva Crome, MD;  Location: [place] CV LAB;  Service: Cardiovascular;  Laterality: N/A;  LAD  . CARDIAC CATHETERIZATION  06/22/2015   Procedure: Coronary/Graft Angiography;  Surgeon: Peter M Martinique, MD;  Location: [place] [facility];  Service: Cardiovascular;;  . CORONARY STENT INTERVENTION N/A 04/02/2016   Procedure: Coronary Stent Intervention;  Surgeon: Jettie Booze, MD;  Location: [place] [facility];  Service: Cardiovascular;  Laterality: N/A;  . ESOPHAGEAL MANOMETRY N/A 07/14/2016   Procedure: ESOPHAGEAL MANOMETRY (EM);  Surgeon: Mauri Pole, MD;  Location: [facility];  Service: Endoscopy;  Laterality: N/A;  . LEFT HEART CATH AND CORONARY ANGIOGRAPHY N/A 04/02/2016   Procedure: Left Heart Cath and Coronary Angiography;  Surgeon: Jettie Booze, MD;  Location: [place] [facility];  Service: Cardiovascular;  Laterality: N/A;  . LEFT HEART CATH AND CORONARY ANGIOGRAPHY N/A 07/17/2016   Procedure: Left Heart Cath and Coronary Angiography;  Surgeon: Leonie Man, MD;  Location: [place] [facility];  Service: Cardiovascular;  Laterality: N/A;    FAMILY HISTORY: Family History  Problem Relation Age of Onset  . Diabetes Mother   . Healthy Father   . Healthy Sister   . Healthy Brother   . Diabetes Maternal Grandmother   . Heart disease Maternal Grandmother   . Diabetes Maternal Aunt        x 2  . Heart attack Neg Hx   . Heart failure Neg Hx   . Stroke Neg Hx   . Hyperlipidemia Neg Hx   . Hypertension Neg Hx   . Stomach cancer Neg Hx   . Colon cancer Neg Hx     SOCIAL HISTORY:  Social History   Social History  . Marital status: Single    Spouse name: N/A  . Number  of children: 4  . Years of education: 12   Occupational History  . CNA [employer]   Social History Main Topics  . Smoking status: Former Smoker    Packs/day: 0.50    Years: 23.00    Types: Cigarettes    Quit date: 04/01/2016  . Smokeless tobacco: Never Used  . Alcohol use No  . Drug use: No  .  Sexual activity: Not Currently    Partners: Male   Other Topics Concern  . Not on file   Social History Narrative   Lives in [place], works nights at [employer], [place]   Right-handed   Drinks 1 soda a day   Exercises some with walking   Lives with her 48 yo son   Likes her space, doesn't want a significant other   03/2016     PHYSICAL EXAM   Vitals:   11/12/16 0754  BP: 111/70  Pulse: 88  Weight: 227 lb (103 kg)  Height: 5\' 9"  (1.753 m)    Not recorded      Body mass index is 33.52 kg/m.  PHYSICAL EXAMNIATION:  Gen: NAD, conversant, well nourised, obese, well groomed                     Cardiovascular: Regular rate rhythm, no peripheral edema, warm, nontender. Eyes: Conjunctivae clear without exudates or hemorrhage Neck: Supple, no carotid bruits. Pulmonary: Clear to auscultation bilaterally   NEUROLOGICAL EXAM:  MENTAL STATUS: Speech:    Speech is normal; fluent and spontaneous with normal comprehension.  Cognition:     Orientation to time, place and person     Normal recent and remote memory     Normal Attention span and concentration     Normal Language, naming, repeating,spontaneous speech     Fund of knowledge   CRANIAL NERVES: CN II: Visual fields are full to confrontation. Fundoscopic exam is normal with sharp discs and no vascular changes. Pupils are round equal and briskly reactive to light. CN III, IV, VI: extraocular movement are normal. No ptosis. CN V: Facial sensation is intact to pinprick in all 3 divisions bilaterally. Corneal responses are intact.  CN VII: Face is symmetric with normal eye closure and smile. CN VIII: Hearing is normal to rubbing  fingers CN IX, X: Palate elevates symmetrically. Phonation is normal. CN XI: Head turning and shoulder shrug are intact CN XII: Tongue is midline with normal movements and no atrophy.  MOTOR: Variable effort on motor examination, fixation of right arm up on rapid rotating movement, but I do not see any significant muscle weakness,  REFLEXES: Reflexes are 2+ and symmetric at the biceps, triceps, knees, and ankles. Plantar responses are flexor.  SENSORY: Intact to light touch, pinprick, positional sensation and vibratory sensation are intact in fingers and toes.  COORDINATION: Rapid alternating movements and fine finger movements are intact. There is no dysmetria on finger-to-nose and heel-knee-shin.    GAIT/STANCE: Steady, able to stand up on tiptoe and heels, tends to drag her right leg across the floor while ambulating Romberg is absent.   DIAGNOSTIC DATA (LABS, IMAGING, TESTING) - I reviewed patient records, labs, notes, testing and imaging myself where available.   ASSESSMENT AND PLAN  KATALENA MALVEAUX is a 36 y.o. female   Right arm and leg paresthesia   MRI of the brain was normal,  Electrodiagnostic study was normal in specific there is no evidence of right upper lower extremity neuropathy, right cervical lumbosacral radiculopathy.  She continue complains of persistent right sided paresthesia, unsure etiology, has tried gabapentin with limited help, continue Lyrica 50 mg 3 times a day   Continue physical therapy  Marcial Pacas, M.D. Ph.D.  [facility] [address] [place], [place] [address] Ph: [phone] Fax: [phone]  CC: Referring Provider

## 2016-11-13 ENCOUNTER — Telehealth (HOSPITAL_COMMUNITY): Payer: Self-pay

## 2016-11-13 ENCOUNTER — Ambulatory Visit (HOSPITAL_COMMUNITY): Payer: 59

## 2016-11-13 NOTE — Telephone Encounter (Signed)
No show, called and left message concerning missed apt today.  Included next apt date and time with contact info given.    Rosa Gambale, LPTA; [facility] [phone]  

## 2016-11-18 ENCOUNTER — Ambulatory Visit (HOSPITAL_COMMUNITY): Payer: 59

## 2016-11-18 ENCOUNTER — Telehealth (HOSPITAL_COMMUNITY): Payer: Self-pay | Admitting: Medical

## 2016-11-18 NOTE — Telephone Encounter (Signed)
11/18/16 pt cx but no reason was given

## 2016-11-20 ENCOUNTER — Ambulatory Visit (HOSPITAL_COMMUNITY): Payer: 59

## 2016-11-20 ENCOUNTER — Telehealth (HOSPITAL_COMMUNITY): Payer: Self-pay

## 2016-11-20 NOTE — Telephone Encounter (Signed)
No show, #2 called and left message concerning missed apt.  Included contact info to schedule further apts.    [address] [place]; [facility] [phone]

## 2017-01-27 ENCOUNTER — Ambulatory Visit (HOSPITAL_COMMUNITY)
Admission: EM | Admit: 2017-01-27 | Discharge: 2017-01-27 | Disposition: A | Discharge status: Home / Self Care | Payer: 59 | Attending: Internal Medicine | Admitting: Internal Medicine

## 2017-01-27 ENCOUNTER — Encounter (HOSPITAL_COMMUNITY): Payer: Self-pay | Admitting: Family Medicine

## 2017-01-27 DIAGNOSIS — E785 Hyperlipidemia, unspecified: Secondary | ICD-10-CM | POA: Insufficient documentation

## 2017-01-27 DIAGNOSIS — Z955 Presence of coronary angioplasty implant and graft: Secondary | ICD-10-CM | POA: Diagnosis not present

## 2017-01-27 DIAGNOSIS — Z79899 Other long term (current) drug therapy: Secondary | ICD-10-CM | POA: Diagnosis not present

## 2017-01-27 DIAGNOSIS — S5411XA Injury of median nerve at forearm level, right arm, initial encounter: Secondary | ICD-10-CM | POA: Diagnosis not present

## 2017-01-27 DIAGNOSIS — Y939 Activity, unspecified: Secondary | ICD-10-CM | POA: Insufficient documentation

## 2017-01-27 DIAGNOSIS — K219 Gastro-esophageal reflux disease without esophagitis: Secondary | ICD-10-CM | POA: Insufficient documentation

## 2017-01-27 DIAGNOSIS — Z7982 Long term (current) use of aspirin: Secondary | ICD-10-CM | POA: Insufficient documentation

## 2017-01-27 DIAGNOSIS — M25579 Pain in unspecified ankle and joints of unspecified foot: Secondary | ICD-10-CM | POA: Insufficient documentation

## 2017-01-27 DIAGNOSIS — Z833 Family history of diabetes mellitus: Secondary | ICD-10-CM | POA: Diagnosis not present

## 2017-01-27 DIAGNOSIS — M25511 Pain in right shoulder: Secondary | ICD-10-CM | POA: Diagnosis not present

## 2017-01-27 DIAGNOSIS — X58XXXA Exposure to other specified factors, initial encounter: Secondary | ICD-10-CM | POA: Diagnosis not present

## 2017-01-27 DIAGNOSIS — E119 Type 2 diabetes mellitus without complications: Secondary | ICD-10-CM | POA: Insufficient documentation

## 2017-01-27 DIAGNOSIS — Z7901 Long term (current) use of anticoagulants: Secondary | ICD-10-CM | POA: Insufficient documentation

## 2017-01-27 DIAGNOSIS — R1310 Dysphagia, unspecified: Secondary | ICD-10-CM | POA: Diagnosis not present

## 2017-01-27 DIAGNOSIS — I110 Hypertensive heart disease with heart failure: Secondary | ICD-10-CM | POA: Insufficient documentation

## 2017-01-27 DIAGNOSIS — R202 Paresthesia of skin: Secondary | ICD-10-CM | POA: Insufficient documentation

## 2017-01-27 DIAGNOSIS — Z87891 Personal history of nicotine dependence: Secondary | ICD-10-CM | POA: Insufficient documentation

## 2017-01-27 DIAGNOSIS — Z8249 Family history of ischemic heart disease and other diseases of the circulatory system: Secondary | ICD-10-CM | POA: Insufficient documentation

## 2017-01-27 DIAGNOSIS — I071 Rheumatic tricuspid insufficiency: Secondary | ICD-10-CM | POA: Diagnosis not present

## 2017-01-27 DIAGNOSIS — E039 Hypothyroidism, unspecified: Secondary | ICD-10-CM | POA: Insufficient documentation

## 2017-01-27 DIAGNOSIS — M25571 Pain in right ankle and joints of right foot: Secondary | ICD-10-CM

## 2017-01-27 DIAGNOSIS — I509 Heart failure, unspecified: Secondary | ICD-10-CM | POA: Diagnosis not present

## 2017-01-27 DIAGNOSIS — R269 Unspecified abnormalities of gait and mobility: Secondary | ICD-10-CM | POA: Diagnosis not present

## 2017-01-27 DIAGNOSIS — Z86718 Personal history of other venous thrombosis and embolism: Secondary | ICD-10-CM | POA: Insufficient documentation

## 2017-01-27 MED ORDER — COLCHICINE 0.6 MG PO TABS: 0.6000 mg | ORAL_TABLET | Freq: Every day | ORAL | 0 refills | Status: DC

## 2017-01-27 MED FILL — COLCHICINE 0.6 MG TABS: 0.6 | 15 days supply | Qty: 15 | Fill #0

## 2017-01-27 NOTE — ED Triage Notes (Signed)
Pt here for right shoulder, chest and arm pain x 3 weeks. Right ankle pain x 1 week. Denies injury. Denies heavy lifting.

## 2017-01-27 NOTE — Discharge Instructions (Addendum)
Right shoulder pain could have many causes.  Exam suggests rotator cuff/impingement pain.  Prescription for colchicine sent to the pharmacy, to try to help with pain and inflammation in the right shoulder.  Ice for 5-10 minutes several times daily and physical therapy may also be helpful for managing pain.  If pain is not improving in a few days, would recheck and consider assessing for other causes.  ECG at the urgent care today was normal.  Heart and lung exam were also unremarkable.

## 2017-01-27 NOTE — ED Provider Notes (Signed)
[facility]    CSN: [id number] Arrival date & time: 01/27/17  1159     History   Chief Complaint Chief Complaint  Patient presents with  . Shoulder Pain  . Ankle Pain    HPI Jennifer Cervantes is a 36 y.o. female.   She has premature coronary disease and had coronary stenting about 18 months ago.  She is taking Plavix for this.  She had a complication, thrombosis of the right radial artery, and has some subsequent residual neuropathy and pain in the right arm.   For the last 3 weeks she has had pain with movement of the right shoulder, no injury or new activities recalled.  She says that it hurts most of the time but really bad when she moves her shoulder. Also has some slight puffiness of the right foot/ankle, and discomfort with weightbearing and with movement of the ankle for about the last week. No dyspnea, no cough, no upper respiratory symptoms.  No GI distress, not vomiting, no change in bowel movements.  She is not achy.      HPI  Past Medical History:  Diagnosis Date  . CAD (coronary artery disease)    a. LHC on 06/21/15 which showed 75% occl mid-dist LCx, 40% occl mRCA, 99% mid-dist LAD s/p DES and 50% occl mLAD s/p DES (overlapping stents) and significant LV dysfunction with anteroapical HK; b. 06/2015 Myoview: EF 55-65%, no ischemia/infarct.  . CHF (congestive heart failure) ([place])   . GERD (gastroesophageal reflux disease)   . Hyperlipidemia   . Hypertension   . Hypothyroidism   . Myocardial stunning (HCC)    a. EF 45% on 2D ECHO on 06/21/15 and severe LV dysfunction on LV gram by cath. Repeat 2D ECHO with EF 55-60% and mild HK of the apicoseptal myocardium.  [name] Neuropraxia of right median nerve    a. suspected after right radial cath. will follow with neuro outpatient if does not resolve.   . NSTEMI (non-ST elevated myocardial infarction) [facility]) 05/2015   [facility]  . Obesity   . 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 05/2015 Following PCI (radial  access)-->conservatively managed.  . Tobacco abuse   . Tricuspid regurgitation   . Type II diabetes mellitus ([place])    a. Type II  . Vitamin D deficiency     Patient Active Problem List   Diagnosis Date Noted  . Right sided weakness 10/13/2016  . Gait abnormality 10/13/2016  . Esophageal dysphagia   . Former smoker 07/15/2016  . Abnormal PFT 07/15/2016  . Vitamin D deficiency 07/15/2016  . Gastroesophageal reflux disease 07/15/2016  . Mood change 07/15/2016  . Paresthesia 07/15/2016  . Right arm pain 07/15/2016  . Encounter for health maintenance examination in adult 04/15/2016  . Chronic diastolic heart failure ([place]) 10/22/2015  . Deficiency anemia 10/05/2015  . Dyspnea 10/05/2015  . Hyperlipidemia   . Diabetes mellitus with complication ([place]) 50/53/9767  . Coronary artery disease involving native coronary artery with angina pectoris ([place])   . Neuropraxia of right median nerve   . Tobacco abuse 06/21/2015  . Obesity 06/21/2015  . Abnormal thyroid function test 06/21/2015  . History of non-ST elevation myocardial infarction (NSTEMI)     Past Surgical History:  Procedure Laterality Date  . [place] STUDY N/A 07/14/2016   Procedure: [place] STUDY;  Surgeon: Mauri Pole, MD;  Location: [facility] ENDOSCOPY;  Service: Endoscopy;  Laterality: N/A;  . CARDIAC CATHETERIZATION N/A 06/21/2015  Procedure: Left Heart Cath and Coronary Angiography;  Surgeon: Belva Crome, MD; pLAD 99%, CFX 75%, RCA 40%, EF 35%   . CARDIAC CATHETERIZATION N/A 06/21/2015   Procedure: Coronary Stent Intervention;  Surgeon: Belva Crome, MD;  Promus Premier DES, 3.0 x 20 and 3.5 x 32 mm stents to the LAD  . CARDIAC CATHETERIZATION N/A 06/21/2015   Procedure: Intravascular Ultrasound/IVUS;  Surgeon: Belva Crome, MD;  Location: [facility] CV LAB;  Service: Cardiovascular;  Laterality: N/A;  LAD  . CARDIAC CATHETERIZATION  06/22/2015   Procedure: Coronary/Graft Angiography;  Surgeon: Peter M Martinique,  MD;  Location: [facility] CV LAB;  Service: Cardiovascular;;  . CORONARY STENT INTERVENTION N/A 04/02/2016   Procedure: Coronary Stent Intervention;  Surgeon: Jettie Booze, MD;  Location: [facility] CV LAB;  Service: Cardiovascular;  Laterality: N/A;  . ESOPHAGEAL MANOMETRY N/A 07/14/2016   Procedure: ESOPHAGEAL MANOMETRY (EM);  Surgeon: Mauri Pole, MD;  Location: [facility] ENDOSCOPY;  Service: Endoscopy;  Laterality: N/A;  . LEFT HEART CATH AND CORONARY ANGIOGRAPHY N/A 04/02/2016   Procedure: Left Heart Cath and Coronary Angiography;  Surgeon: Jettie Booze, MD;  Location: [place] CV LAB;  Service: Cardiovascular;  Laterality: N/A;  . LEFT HEART CATH AND CORONARY ANGIOGRAPHY N/A 07/17/2016   Procedure: Left Heart Cath and Coronary Angiography;  Surgeon: Leonie Man, MD;  Location: [facility] CV LAB;  Service: Cardiovascular;  Laterality: N/A;     Home Medications    Prior to Admission medications   Medication Sig Start Date End Date Taking? Authorizing Provider  acetaminophen (TYLENOL) 500 MG tablet Take 500 mg by mouth every 6 (six) hours as needed for headache (pain).    [provider]  aspirin 81 MG EC tablet Take 1 tablet (81 mg total) by mouth daily. 04/16/16   Tysinger, Camelia Eng, PA-C  atorvastatin (LIPITOR) 40 MG tablet Take 1 tablet (40 mg total) by mouth daily at 6 PM. 04/16/16   Tysinger, Camelia Eng, PA-C  Cholecalciferol (VITAMIN D) 2000 units tablet Take 1 tablet (2,000 Units total) by mouth daily. 07/15/16   Tysinger, Camelia Eng, PA-C  clopidogrel (PLAVIX) 75 MG tablet Take 75 mg by mouth 2 (two) times daily.    [provider]  colchicine 0.6 MG tablet Take 1 tablet (0.6 mg total) by mouth daily for 15 days. On day 1, take 1 tab twice daily, then 1 tab daily starting on day 2. 01/27/17 02/11/17  Sherlene Shams, MD  cyclobenzaprine (FLEXERIL) 10 MG tablet Take 1 tablet (10 mg total) by mouth 2 (two) times daily as needed for muscle spasms. 11/07/16    Mackuen, Courteney Lyn, MD  ferrous gluconate (FERGON) 324 MG tablet Take 1 tablet (324 mg total) by mouth daily with breakfast. 04/16/16   Tysinger, Camelia Eng, PA-C  fluticasone furoate-vilanterol (BREO ELLIPTA) 200-25 MCG/INH AEPB Inhale 1 puff into the lungs daily. 07/15/16   Tysinger, Camelia Eng, PA-C  gabapentin (NEURONTIN) 300 MG capsule Take 1 capsule (300 mg total) by mouth 3 (three) times daily. 10/13/16   Marcial Pacas, MD  Insulin Glargine (LANTUS SOLOSTAR) 100 UNIT/ML Solostar Pen Inject 10 Units into the skin daily at 10 pm. 04/16/16   Tysinger, Camelia Eng, PA-C  linaclotide [facility]) 145 MCG CAPS capsule Take 1 capsule (145 mcg total) by mouth daily before breakfast. 09/03/16   Armbruster, Carlota Raspberry, MD  lisinopril-hydrochlorothiazide (PRINZIDE,ZESTORETIC) 20-25 MG tablet Take 0.5 tablets by mouth daily. 10/06/16   Belva Crome, MD  metFORMIN (GLUCOPHAGE) 1000 MG tablet Take 1 tablet (1,000 mg total) by mouth 2 (two) times daily with a meal. 04/16/16   Tysinger, Camelia Eng, PA-C  metoprolol tartrate (LOPRESSOR) 100 MG tablet Take 1 tablet (100 mg total) by mouth 2 (two) times daily. Patient taking differently: Take 50 mg by mouth 2 (two) times daily.  09/29/16 12/28/16  Richardson Dopp T, PA-C  nicotine (NICODERM CQ - DOSED IN MG/24 HOURS) 21 mg/24hr patch Place 1 patch (21 mg total) onto the skin daily. 08/16/15   Tysinger, Camelia Eng, PA-C  nitroGLYCERIN (NITROSTAT) 0.4 MG SL tablet Place 1 tablet (0.4 mg total) under the tongue every 5 (five) minutes as needed for chest pain. 06/04/16   Belva Crome, MD  ondansetron (ZOFRAN) 4 MG tablet Take 4 mg by mouth every 6 (six) hours as needed for nausea.  07/28/16   [provider]  oxyCODONE (OXY IR/ROXICODONE) 5 MG immediate release tablet Take 5 mg by mouth every 4 (four) hours as needed for breakthrough pain.    [provider]  pantoprazole (PROTONIX) 40 MG tablet Take 40 mg by mouth 2 (two) times daily.    [provider]  pregabalin  (LYRICA) 50 MG capsule Take 1 capsule (50 mg total) by mouth 3 (three) times daily. 10/31/16   Marcial Pacas, MD  promethazine (PHENERGAN) 12.5 MG tablet Take 1 tablet (12.5 mg total) by mouth every 6 (six) hours as needed for nausea or vomiting. 08/06/16   Tysinger, Camelia Eng, PA-C    Family History Family History  Problem Relation Age of Onset  . Diabetes Mother   . Healthy Father   . Healthy Sister   . Healthy Brother   . Diabetes Maternal Grandmother   . Heart disease Maternal Grandmother   . Diabetes Maternal Aunt        x 2  . Heart attack Neg Hx   . Heart failure Neg Hx   . Stroke Neg Hx   . Hyperlipidemia Neg Hx   . Hypertension Neg Hx   . Stomach cancer Neg Hx   . Colon cancer Neg Hx     Social History Social History   Tobacco Use  . Smoking status: Former Smoker    Packs/day: 0.50    Years: 23.00    Pack years: 11.50    Types: Cigarettes    Last attempt to quit: 04/01/2016    Years since quitting: 0.8  . Smokeless tobacco: Never Used  Substance Use Topics  . Alcohol use: No    Alcohol/week: 0.0 oz  . Drug use: No     Allergies   Patient has no known allergies.   Review of Systems Review of Systems  All other systems reviewed and are negative.    Physical Exam Triage Vital Signs ED Triage Vitals  Enc Vitals Group     BP 01/27/17 1233 (!) 155/97     Pulse Rate 01/27/17 1233 79     Resp 01/27/17 1233 18     Temp 01/27/17 1233 98.7 F (37.1 C)     Temp src --      SpO2 01/27/17 1233 100 %     Weight --      Height --      Pain Score 01/27/17 1231 7     Pain Loc --    Updated Vital Signs BP (!) 155/97   Pulse 79   Temp 98.7 F (37.1 C)   Resp 18   SpO2 100%  Physical Exam Constitutional: She is oriented to person, place, and time. No distress.  HENT:  Head: Atraumatic.  Eyes:  Conjugate gaze observed, no eye redness/discharge  Neck: Neck supple.  Cardiovascular: Normal rate and regular rhythm.  Pulmonary/Chest: No respiratory distress.  She has no wheezes. She has no rales.  Abdominal: She exhibits no distension.  Musculoskeletal: Normal range of motion.  Patient is able to raise her right arm at the shoulder overhead and externally rotate but it is very painful, and obviously harder to raise the right arm than the left.  She is able to internally rotate with less discomfort.  She is diffusely tender to palpation over the anterior shoulder, and this extends onto the right pectoral chest and into the right trapezius. Right dorsal foot is slightly puffy compared to the left, good range of motion at the ankle, but it is uncomfortable to flex and extend the ankle and it is increased with weightbearing.  Neurological: She is alert and oriented to person, place, and time.  Skin: Skin is warm and dry.  Nursing note and vitals reviewed.   UC Treatments / Results   Procedures Procedures (including critical care time) ECG today at the urgent care was normal, no acute ST/T wave changes  Final Clinical Impressions(s) / UC Diagnoses   Final diagnoses:  Acute pain of right shoulder  Acute right ankle pain   Right shoulder pain could have many causes.  Exam suggests rotator cuff/impingement pain.  Prescription for colchicine sent to the pharmacy, to try to help with pain and inflammation in the right shoulder.  Ice for 5-10 minutes several times daily and physical therapy may also be helpful for managing pain.  If pain is not improving in a few days, would recheck and consider assessing for other causes.  ECG at the urgent care today was normal.  Heart and lung exam were also unremarkable.  ED Discharge Orders        Ordered    colchicine 0.6 MG tablet  Daily     01/27/17 1313       Controlled Substance Prescriptions [place] Controlled Substance Registry consulted? No   Sherlene Shams, MD 01/28/17 [phone]

## 2017-02-02 ENCOUNTER — Other Ambulatory Visit: Payer: Self-pay | Admitting: *Deleted

## 2017-02-02 NOTE — Patient Outreach (Signed)
Jennifer Cervantes transitioned from the [employer] [facility] program to the [employer] on 04/17/16 for Type II diabetes self-management assistance so will close case to the diabetes [facility] program due to delegation of disease management services to [employer] from [employer] for [employer] members in 2019. Barrington Ellison RN,CCM,CDE [facility] Management Coordinator [facility] and [employer] [phone] Office Fax [phone]

## 2017-02-06 ENCOUNTER — Encounter: Payer: Self-pay | Admitting: Interventional Cardiology

## 2017-02-06 ENCOUNTER — Ambulatory Visit (INDEPENDENT_AMBULATORY_CARE_PROVIDER_SITE_OTHER): Payer: 59 | Admitting: Interventional Cardiology

## 2017-02-06 VITALS — BP 152/90 | HR 79 | Ht 69.0 in | Wt 236.2 lb

## 2017-02-06 DIAGNOSIS — Z720 Tobacco use: Secondary | ICD-10-CM

## 2017-02-06 DIAGNOSIS — E118 Type 2 diabetes mellitus with unspecified complications: Secondary | ICD-10-CM | POA: Diagnosis not present

## 2017-02-06 DIAGNOSIS — S5411XS Injury of median nerve at forearm level, right arm, sequela: Secondary | ICD-10-CM | POA: Diagnosis not present

## 2017-02-06 DIAGNOSIS — I5032 Chronic diastolic (congestive) heart failure: Secondary | ICD-10-CM | POA: Diagnosis not present

## 2017-02-06 DIAGNOSIS — I25119 Atherosclerotic heart disease of native coronary artery with unspecified angina pectoris: Secondary | ICD-10-CM | POA: Diagnosis not present

## 2017-02-06 DIAGNOSIS — R202 Paresthesia of skin: Secondary | ICD-10-CM

## 2017-02-06 DIAGNOSIS — M79601 Pain in right arm: Secondary | ICD-10-CM

## 2017-02-06 DIAGNOSIS — I10 Essential (primary) hypertension: Secondary | ICD-10-CM | POA: Diagnosis not present

## 2017-02-06 DIAGNOSIS — Z6841 Body Mass Index (BMI) 40.0 and over, adult: Secondary | ICD-10-CM | POA: Diagnosis not present

## 2017-02-06 DIAGNOSIS — E66813 Obesity, class 3: Secondary | ICD-10-CM

## 2017-02-06 LAB — COMPREHENSIVE METABOLIC PANEL WITH GFR
ALT: 16 [IU]/L (ref 0–32)
AST: 22 [IU]/L (ref 0–40)
Albumin/Globulin Ratio: 1.4 (ref 1.2–2.2)
Albumin: 4.5 g/dL (ref 3.5–5.5)
Alkaline Phosphatase: 82 [IU]/L (ref 39–117)
BUN/Creatinine Ratio: 9 (ref 9–23)
BUN: 8 mg/dL (ref 6–20)
Bilirubin Total: 0.5 mg/dL (ref 0.0–1.2)
CO2: 17 mmol/L — ABNORMAL LOW (ref 20–29)
Calcium: 9.4 mg/dL (ref 8.7–10.2)
Chloride: 106 mmol/L (ref 96–106)
Creatinine, Ser: 0.87 mg/dL (ref 0.57–1.00)
GFR calc Af Amer: 98 mL/min/{1.73_m2}
GFR calc non Af Amer: 85 mL/min/{1.73_m2}
Globulin, Total: 3.3 g/dL (ref 1.5–4.5)
Glucose: 132 mg/dL — ABNORMAL HIGH (ref 65–99)
Potassium: 4.2 mmol/L (ref 3.5–5.2)
Sodium: 141 mmol/L (ref 134–144)
Total Protein: 7.8 g/dL (ref 6.0–8.5)

## 2017-02-06 LAB — HEPATIC FUNCTION PANEL: Bilirubin, Direct: 0.09 mg/dL (ref 0.00–0.40)

## 2017-02-06 MED ORDER — AMLODIPINE BESYLATE 5 MG PO TABS: 5.0000 mg | ORAL_TABLET | Freq: Every day | ORAL | 3 refills | Status: DC

## 2017-02-06 MED FILL — AMLODIPINE BESYLATE 5 MG TA: 5 | 90 days supply | Qty: 90 | Fill #0

## 2017-02-06 NOTE — Progress Notes (Signed)
Cardiology Office Note    Date:  02/06/2017   ID:  [name], DOB 06/05/1980, MRN [id number]  PCP:  Jennifer Hurl, PA-C  Cardiologist: Jennifer Grooms, MD   Chief Complaint  Patient presents with  . Coronary Artery Disease  . Congestive Heart Failure  . Numbness    Right arm and right leg    History of Present Illness:  Jennifer Cervantes is a 36 y.o. female long-standing diabetes mellitus, hypertension, hyperlipidemia, and CAD with multiple stents LAD during acute coronary syndrome, and multiple heart catheterizations from the right radial approach and subsequently the femoral approach during recurring episodes of pain.  Since her second heart catheterization clinical course has been complicated by paresthesias and pain in the right arm and right leg.  A neuro diagnosis of neuro praxis of the right arm has been made but no evidence of central nervous system or peripheral abnormalities on neurological testing.  She is now back at work.  Says she has weakness and pain in her right arm including the hand and shoulder with movement.  She does have an absent right radial pulse but a bounding ulnar pulse.  She also complains of pain with ambulation of the right leg, and ankle pain.  The arm and leg have been reevaluated by neurology who could find no objective evidence of nerve injury or neurological syndrome.  Physical therapy has been recommended.  She was not compliant with physical therapy.  This is documented in her records.  Cardiac wise she has some exertional dyspnea.  Rare chest discomfort occurs.  No nitroglycerin use.  She denies orthopnea, PND, and ankle edema.   Past Medical History:  Diagnosis Date  . CAD (coronary artery disease)    a. LHC on 06/21/15 which showed 75% occl mid-dist LCx, 40% occl mRCA, 99% mid-dist LAD s/p DES and 50% occl mLAD s/p DES (overlapping stents) and significant LV dysfunction with anteroapical HK; b. 06/2015 Myoview: EF 55-65%, no  ischemia/infarct.  . CHF (congestive heart failure) ([place])   . GERD (gastroesophageal reflux disease)   . Hyperlipidemia   . Hypertension   . Hypothyroidism   . Myocardial stunning (HCC)    a. EF 45% on 2D ECHO on 06/21/15 and severe LV dysfunction on LV gram by cath. Repeat 2D ECHO with EF 55-60% and mild HK of the apicoseptal myocardium.  [name] Neuropraxia of right median nerve    a. suspected after right radial cath. will follow with neuro outpatient if does not resolve.   . NSTEMI (non-ST elevated myocardial infarction) [facility]) 05/2015   [facility]  . Obesity   . 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 05/2015 Following PCI (radial access)-->conservatively managed.  . Tobacco abuse   . Tricuspid regurgitation   . Type II diabetes mellitus ([place])    a. Type II  . Vitamin D deficiency     Past Surgical History:  Procedure Laterality Date  . [place] STUDY N/A 07/14/2016   Procedure: [place] STUDY;  Surgeon: Mauri Pole, MD;  Location: [facility];  Service: Endoscopy;  Laterality: N/A;  . CARDIAC CATHETERIZATION N/A 06/21/2015   Procedure: Left Heart Cath and Coronary Angiography;  Surgeon: Belva Crome, MD; pLAD 99%, CFX 75%, RCA 40%, EF 35%   . CARDIAC CATHETERIZATION N/A 06/21/2015   Procedure: Coronary Stent Intervention;  Surgeon: Belva Crome, MD;  Promus Premier DES, 3.0 x 20 and 3.5 x 32 mm stents to  the LAD  . CARDIAC CATHETERIZATION N/A 06/21/2015   Procedure: Intravascular Ultrasound/IVUS;  Surgeon: Belva Crome, MD;  Location: [place] [facility];  Service: Cardiovascular;  Laterality: N/A;  LAD  . CARDIAC CATHETERIZATION  06/22/2015   Procedure: Coronary/Graft Angiography;  Surgeon: Peter M Martinique, MD;  Location: [place] [facility];  Service: Cardiovascular;;  . CORONARY STENT INTERVENTION N/A 04/02/2016   Procedure: Coronary Stent Intervention;  Surgeon: Jettie Booze, MD;  Location: [place] [facility];  Service: Cardiovascular;  Laterality: N/A;  .  ESOPHAGEAL MANOMETRY N/A 07/14/2016   Procedure: ESOPHAGEAL MANOMETRY (EM);  Surgeon: Mauri Pole, MD;  Location: [facility];  Service: Endoscopy;  Laterality: N/A;  . LEFT HEART CATH AND CORONARY ANGIOGRAPHY N/A 04/02/2016   Procedure: Left Heart Cath and Coronary Angiography;  Surgeon: Jettie Booze, MD;  Location: [place] CV LAB;  Service: Cardiovascular;  Laterality: N/A;  . LEFT HEART CATH AND CORONARY ANGIOGRAPHY N/A 07/17/2016   Procedure: Left Heart Cath and Coronary Angiography;  Surgeon: Leonie Man, MD;  Location: [place] CV LAB;  Service: Cardiovascular;  Laterality: N/A;    Current Medications: Outpatient Medications Prior to Visit  Medication Sig Dispense Refill  . acetaminophen (TYLENOL) 500 MG tablet Take 500 mg by mouth every 6 (six) hours as needed for headache (pain).    . Albuterol Sulfate (PROVENTIL HFA IN) Inhale 2 puffs into the lungs 4 (four) times daily as needed for shortness of breath or wheezing.    [name] aspirin 81 MG EC tablet Take 1 tablet (81 mg total) by mouth daily. 90 tablet 3  . atorvastatin (LIPITOR) 40 MG tablet Take 1 tablet (40 mg total) by mouth daily at 6 PM. 90 tablet 3  . Cholecalciferol (VITAMIN D) 2000 units tablet Take 1 tablet (2,000 Units total) by mouth daily. 90 tablet 3  . clopidogrel (PLAVIX) 75 MG tablet Take 75 mg by mouth 2 (two) times daily.    . colchicine 0.6 MG tablet Take 1 tablet (0.6 mg total) by mouth daily for 15 days. On day 1, take 1 tab twice daily, then 1 tab daily starting on day 2. 15 tablet 0  . cyclobenzaprine (FLEXERIL) 10 MG tablet Take 1 tablet (10 mg total) by mouth 2 (two) times daily as needed for muscle spasms. 10 tablet 0  . ferrous gluconate (FERGON) 324 MG tablet Take 1 tablet (324 mg total) by mouth daily with breakfast. 90 tablet 1  . fluticasone furoate-vilanterol (BREO ELLIPTA) 200-25 MCG/INH AEPB Inhale 1 puff into the lungs daily. 28 each 0  . gabapentin (NEURONTIN) 300 MG capsule Take 1  capsule (300 mg total) by mouth 3 (three) times daily. 90 capsule 11  . Insulin Glargine (LANTUS SOLOSTAR) 100 UNIT/ML Solostar Pen Inject 10 Units into the skin daily at 10 pm. 5 pen 5  . linaclotide (LINZESS) 145 MCG CAPS capsule Take 1 capsule (145 mcg total) by mouth daily before breakfast. 90 capsule 1  . lisinopril-hydrochlorothiazide (PRINZIDE,ZESTORETIC) 20-25 MG tablet Take 0.5 tablets by mouth daily. 45 tablet 3  . metFORMIN (GLUCOPHAGE) 1000 MG tablet Take 1 tablet (1,000 mg total) by mouth 2 (two) times daily with a meal. 180 tablet 3  . metoprolol tartrate (LOPRESSOR) 100 MG tablet Take 50 mg by mouth 2 (two) times daily.    . nicotine (NICODERM CQ - DOSED IN MG/24 HOURS) 21 mg/24hr patch Place 1 patch (21 mg total) onto the skin daily. 28 patch 0  . nitroGLYCERIN (NITROSTAT)  0.4 MG SL tablet Place 1 tablet (0.4 mg total) under the tongue every 5 (five) minutes as needed for chest pain. 25 tablet 3  . ondansetron (ZOFRAN) 4 MG tablet Take 4 mg by mouth every 6 (six) hours as needed for nausea.   0  . oxyCODONE (OXY IR/ROXICODONE) 5 MG immediate release tablet Take 5 mg by mouth every 4 (four) hours as needed for breakthrough pain.    . pantoprazole (PROTONIX) 40 MG tablet Take 40 mg by mouth 2 (two) times daily.    . pregabalin (LYRICA) 50 MG capsule Take 1 capsule (50 mg total) by mouth 3 (three) times daily. 90 capsule 5  . promethazine (PHENERGAN) 12.5 MG tablet Take 1 tablet (12.5 mg total) by mouth every 6 (six) hours as needed for nausea or vomiting. 15 tablet 0  . metoprolol tartrate (LOPRESSOR) 100 MG tablet Take 1 tablet (100 mg total) by mouth 2 (two) times daily. (Patient taking differently: Take 50 mg by mouth 2 (two) times daily. ) 180 tablet 3   No facility-administered medications prior to visit.      Allergies:   Patient has no known allergies.   Social History   Socioeconomic History  . Marital status: Single    Spouse name: None  . Number of children: 4  .  Years of education: 72  . Highest education level: None  Social Needs  . Financial resource strain: None  . Food insecurity - worry: None  . Food insecurity - inability: None  . Transportation needs - medical: None  . Transportation needs - non-medical: None  Occupational History  . Occupation: Forensic psychologist: [employer]  Tobacco Use  . Smoking status: Former Smoker    Packs/day: 0.50    Years: 23.00    Pack years: 11.50    Types: Cigarettes    Last attempt to quit: 04/01/2016    Years since quitting: 0.8  . Smokeless tobacco: Never Used  Substance and Sexual Activity  . Alcohol use: No    Alcohol/week: 0.0 oz  . Drug use: No  . Sexual activity: Not Currently    Partners: Male  Other Topics Concern  . None  Social History Narrative   Lives in [place], works nights at [employer], [place]   Right-handed   [place] 1 soda a day   Exercises some with walking   Lives with her 27 yo son   Likes her space, doesn't want a significant other   03/2016     Family History:  The patient's family history includes Diabetes in her maternal aunt, maternal grandmother, and mother; Healthy in her brother, father, and sister; Heart disease in her maternal grandmother.   ROS:   Please see the history of present illness.    Headaches, joint swelling, leg pain, ankle pain, leg swelling, right arm pain, right leg pain, and anxiety. All other systems reviewed and are negative.   PHYSICAL EXAM:   VS:  BP (!) 152/90   Pulse 79   Ht '5\' 9"'  (1.753 m)   Wt 236 lb 3.2 oz (107.1 kg)   BMI 34.88 kg/m    GEN: Well nourished, well developed, in no acute distress  HEENT: normal  Neck: no JVD, carotid bruits, or masses Cardiac: RRR; no murmurs, rubs, or gallops,no edema  Respiratory:  clear to auscultation bilaterally, normal work of breathing GI: soft, nontender, nondistended, + BS MS: no deformity or atrophy  Skin: warm and dry, no rash Neuro:  Alert and Oriented x 3, Strength and sensation are  intact Psych: euthymic mood, full affect  Wt Readings from Last 3 Encounters:  02/06/17 236 lb 3.2 oz (107.1 kg)  11/12/16 227 lb (103 kg)  10/13/16 232 lb (105.2 kg)      Studies/Labs Reviewed:   EKG:  EKG not repeated  Recent Labs: 04/15/2016: TSH 0.56 07/29/2016: NT-Pro BNP 19 08/15/2016: BUN 8; Creatinine, Ser 0.84; Hemoglobin 12.8; Platelets 300; Potassium 4.3; Sodium 141   Lipid Panel    Component Value Date/Time   CHOL 140 01/25/2016 1015   TRIG 63 01/25/2016 1015   HDL 43 (L) 01/25/2016 1015   CHOLHDL 3.3 01/25/2016 1015   VLDL 13 01/25/2016 1015   LDLCALC 84 01/25/2016 1015    Additional studies/ records that were reviewed today include:  No new data.  Reviewed the neurology assessment from earlier this fall.    ASSESSMENT:    1. Coronary artery disease involving native coronary artery of native heart with angina pectoris ([place])   2. Chronic diastolic heart failure ([place])   3. Diabetes mellitus with complication ([place])   4. Essential hypertension   5. Tobacco abuse   6. Class 3 severe obesity due to excess calories with serious comorbidity and body mass index (BMI) of 40.0 to 44.9 in adult ([place])   7. Right arm pain   8. Paresthesia   9. Neurapraxia of right median nerve, sequela      PLAN:  In order of problems listed above:  1. Stable with minimal symptoms.  Continue current therapy. 2. No evidence of volume overload. 3. A1c target is less than 7.  Most recent evaluation from February was 6.8. 4. Blood pressure is too high.  Target 130/85 mmHg or less.  Amlodipine 5 mg/day is added.  Record blood pressure weekly for the next 4 weeks and call results. 5. No longer smoking. 6. Aerobic activity prescribed 7. Physical therapy and activity to avoid atrophy is discussed. 8. Uncertain etiology for arm and leg paresthesias.  Not clearly related to arterial puncture for catheterization at radial and femoral artery sites. 9. Diagnosis made by neurology.  But nerve  conduction studies unremarkable.  I am not sure this diagnosis exists.  Amlodipine added for blood pressure.  Call blood pressure results in 1 month.  She did not complete physical therapy.  I have encouraged her to consider return and at the very least perform the activities that they recommended.  C met and lipid panel will be done today.  Clinical follow-up in 9 months.    Medication Adjustments/Labs and Tests Ordered: Current medicines are reviewed at length with the patient today.  Concerns regarding medicines are outlined above.  Medication changes, Labs and Tests ordered today are listed in the Patient Instructions below. Patient Instructions  Medication Instructions:  Your physician has recommended you make the following change in your medication:   1) START amlodipine 5 mg by mouth daily.  Labwork: Your physician recommends that you get lab work today CMET and Lipid today.   Testing/Procedures: None   Follow-Up: Your physician wants you to follow-up in: 6-9 months with Dr. Tamala Julian. You will receive a reminder letter in the mail two months in advance. If you don't receive a letter, please call our office to schedule the follow-up appointment.    Any Other Special Instructions Will Be Listed Below (If Applicable).  Monitor BP for 1 month and call in results.  Monitor at least once a week and make sure  it is about 2 hours after medication. Target Blood pressure 130/85 or less.  Please call sooner if your blood pressure is consistently higher than target.   If you need a refill on your cardiac medications before your next appointment, please call your pharmacy.      Signed, Jennifer Grooms, MD  02/06/2017 10:05 AM    [address], [place], [place]  [address] Phone: [phone]; Fax: [phone]

## 2017-02-06 NOTE — Patient Instructions (Addendum)
Medication Instructions:  Your physician has recommended you make the following change in your medication:   1) START amlodipine 5 mg by mouth daily.  Labwork: Your physician recommends that you get lab work today CMET and Lipid today.   Testing/Procedures: None   Follow-Up: Your physician wants you to follow-up in: 6-9 months with Dr. Tamala Julian. You will receive a reminder letter in the mail two months in advance. If you don't receive a letter, please call our office to schedule the follow-up appointment.    Any Other Special Instructions Will Be Listed Below (If Applicable).  Monitor BP for 1 month and call in results.  Monitor at least once a week and make sure it is about 2 hours after medication. Target Blood pressure 130/85 or less.  Please call sooner if your blood pressure is consistently higher than target.   If you need a refill on your cardiac medications before your next appointment, please call your pharmacy.

## 2017-02-09 ENCOUNTER — Encounter: Payer: Self-pay | Admitting: Interventional Cardiology

## 2017-02-10 ENCOUNTER — Encounter: Payer: Self-pay | Admitting: Interventional Cardiology

## 2017-02-10 ENCOUNTER — Other Ambulatory Visit: Payer: Self-pay | Admitting: Medical

## 2017-02-10 MED FILL — FERROUS GLUCONATE 324 MG TA: 324 (38 FE) | 90 days supply | Qty: 90 | Fill #1

## 2017-02-10 MED FILL — CLOPIDOGREL 75 MG TABLET: 75 | 90 days supply | Qty: 90 | Fill #2

## 2017-02-10 MED FILL — METOPROLOL TARTRATE 50 MG T: 50 | 60 days supply | Qty: 120 | Fill #3

## 2017-02-10 MED FILL — LANTUS SOLOSTAR 100 UNITS/M: 100 | 84 days supply | Qty: 9 | Fill #1

## 2017-02-10 MED FILL — LISINOPRIL-HCTZ 10-12.5 MG: 10-12.5 | 90 days supply | Qty: 90 | Fill #1

## 2017-02-10 MED FILL — ATORVASTATIN 40 MG TABLET: 40 | 90 days supply | Qty: 90 | Fill #4

## 2017-02-11 ENCOUNTER — Ambulatory Visit (INDEPENDENT_AMBULATORY_CARE_PROVIDER_SITE_OTHER): Payer: 59 | Admitting: Medical

## 2017-02-11 ENCOUNTER — Encounter: Payer: Self-pay | Admitting: Medical

## 2017-02-11 VITALS — BP 124/76 | HR 80 | Wt 233.4 lb

## 2017-02-11 DIAGNOSIS — E088 Diabetes mellitus due to underlying condition with unspecified complications: Secondary | ICD-10-CM | POA: Diagnosis not present

## 2017-02-11 DIAGNOSIS — R200 Anesthesia of skin: Secondary | ICD-10-CM | POA: Diagnosis not present

## 2017-02-11 DIAGNOSIS — E669 Obesity, unspecified: Secondary | ICD-10-CM | POA: Diagnosis not present

## 2017-02-11 DIAGNOSIS — E119 Type 2 diabetes mellitus without complications: Secondary | ICD-10-CM

## 2017-02-11 DIAGNOSIS — D539 Nutritional anemia, unspecified: Secondary | ICD-10-CM | POA: Diagnosis not present

## 2017-02-11 DIAGNOSIS — I25119 Atherosclerotic heart disease of native coronary artery with unspecified angina pectoris: Secondary | ICD-10-CM

## 2017-02-11 DIAGNOSIS — Z87891 Personal history of nicotine dependence: Secondary | ICD-10-CM

## 2017-02-11 DIAGNOSIS — E559 Vitamin D deficiency, unspecified: Secondary | ICD-10-CM | POA: Diagnosis not present

## 2017-02-11 DIAGNOSIS — E7849 Other hyperlipidemia: Secondary | ICD-10-CM | POA: Diagnosis not present

## 2017-02-11 DIAGNOSIS — M25571 Pain in right ankle and joints of right foot: Secondary | ICD-10-CM | POA: Diagnosis not present

## 2017-02-11 DIAGNOSIS — E1169 Type 2 diabetes mellitus with other specified complication: Secondary | ICD-10-CM

## 2017-02-11 MED FILL — UNIFINE PENTIPS 32GX5/32": 32G X 4 MM | 90 days supply | Qty: 100 | Fill #0

## 2017-02-11 MED FILL — UNIFINE PENTIPS 32GX5/32: 32G X 4 MM | 90 days supply | Qty: 100 | Fill #0

## 2017-02-11 NOTE — Progress Notes (Signed)
Subjective: Chief Complaint  Patient presents with  . Follow-up    follow up    Here for routine follow-up in my request  She has a history of diabetes, hypertension, hyperlipidemia, coronary artery disease, multiple stents, multiple heart catheterizations, chronic heart failure, former smoker, vitamin D deficiency, obesity, and history of noncompliance at times.  She was seen recently by cardiology, Dr. Tamala Julian, and amlodipine was added to get blood pressure to goal.  She wants referral to cardiology at [facility].   Lives in [place], works at [employer].  Exercise - not exercising.    When standing up heart racing, and she continues to have right arm and right ankle pain.  She has hx/o right artery thrombus in the right arm 2017. Since then has continues to have numbness in the right hand and distal arm.  Had nerve tests a few months ago showing no nerve damage  She was seen in the ED in recent weeks for right ankle pain.   Denies particular injury, fall or trauma.   Using nothing for symptoms.    Diabetes - compliant with medication.   Trying to eat healthy.  HTN, hyperlipidemia - compliant with medications.    Past Medical History:  Diagnosis Date  . CAD (coronary artery disease)    a. LHC on 06/21/15 which showed 75% occl mid-dist LCx, 40% occl mRCA, 99% mid-dist LAD s/p DES and 50% occl mLAD s/p DES (overlapping stents) and significant LV dysfunction with anteroapical HK; b. 06/2015 Myoview: EF 55-65%, no ischemia/infarct.  . CHF (congestive heart failure) ([place])   . GERD (gastroesophageal reflux disease)   . Hyperlipidemia   . Hypertension   . Hypothyroidism   . Myocardial stunning (HCC)    a. EF 45% on 2D ECHO on 06/21/15 and severe LV dysfunction on LV gram by cath. Repeat 2D ECHO with EF 55-60% and mild HK of the apicoseptal myocardium.  [name] Neuropraxia of right median nerve    a. suspected after right radial cath. will follow with neuro outpatient if does not resolve.   . NSTEMI  (non-ST elevated myocardial infarction) [facility]) 05/2015   [facility]  . Obesity   . 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 05/2015 Following PCI (radial access)-->conservatively managed.  . Tobacco abuse   . Tricuspid regurgitation   . Type II diabetes mellitus ([place])    a. Type II  . Vitamin D deficiency    Current Outpatient Medications on File Prior to Visit  Medication Sig Dispense Refill  . acetaminophen (TYLENOL) 500 MG tablet Take 500 mg by mouth every 6 (six) hours as needed for headache (pain).    [name] amLODipine (NORVASC) 5 MG tablet Take 1 tablet (5 mg total) by mouth daily. 90 tablet 3  . aspirin 81 MG EC tablet Take 1 tablet (81 mg total) by mouth daily. 90 tablet 3  . atorvastatin (LIPITOR) 40 MG tablet Take 1 tablet (40 mg total) by mouth daily at 6 PM. 90 tablet 3  . Cholecalciferol (VITAMIN D) 2000 units tablet Take 1 tablet (2,000 Units total) by mouth daily. 90 tablet 3  . clopidogrel (PLAVIX) 75 MG tablet Take 75 mg by mouth 2 (two) times daily.    . colchicine 0.6 MG tablet Take 1 tablet (0.6 mg total) by mouth daily for 15 days. On day 1, take 1 tab twice daily, then 1 tab daily starting on day 2. 15 tablet 0  . cyclobenzaprine (FLEXERIL) 10 MG tablet Take 1  tablet (10 mg total) by mouth 2 (two) times daily as needed for muscle spasms. 10 tablet 0  . ferrous gluconate (FERGON) 324 MG tablet Take 1 tablet (324 mg total) by mouth daily with breakfast. 90 tablet 1  . fluticasone furoate-vilanterol (BREO ELLIPTA) 200-25 MCG/INH AEPB Inhale 1 puff into the lungs daily. 28 each 0  . Insulin Glargine (LANTUS SOLOSTAR) 100 UNIT/ML Solostar Pen Inject 10 Units into the skin daily at 10 pm. 5 pen 5  . linaclotide (LINZESS) 145 MCG CAPS capsule Take 1 capsule (145 mcg total) by mouth daily before breakfast. 90 capsule 1  . lisinopril-hydrochlorothiazide (PRINZIDE,ZESTORETIC) 20-25 MG tablet Take 0.5 tablets by mouth daily. 45 tablet 3  . metFORMIN (GLUCOPHAGE) 1000 MG tablet Take 1  tablet (1,000 mg total) by mouth 2 (two) times daily with a meal. 180 tablet 3  . metoprolol tartrate (LOPRESSOR) 100 MG tablet Take 50 mg by mouth 2 (two) times daily.    . nitroGLYCERIN (NITROSTAT) 0.4 MG SL tablet Place 1 tablet (0.4 mg total) under the tongue every 5 (five) minutes as needed for chest pain. 25 tablet 3  . oxyCODONE (OXY IR/ROXICODONE) 5 MG immediate release tablet Take 5 mg by mouth every 4 (four) hours as needed for breakthrough pain.    . pantoprazole (PROTONIX) 40 MG tablet Take 40 mg by mouth 2 (two) times daily.    . pregabalin (LYRICA) 50 MG capsule Take 1 capsule (50 mg total) by mouth 3 (three) times daily. 90 capsule 5  . promethazine (PHENERGAN) 12.5 MG tablet Take 1 tablet (12.5 mg total) by mouth every 6 (six) hours as needed for nausea or vomiting. 15 tablet 0  . UNIFINE PENTIPS 32G X 4 MM MISC USE AS DIRECTED (USE WITH LANTUS PEN) 100 each 5   No current facility-administered medications on file prior to visit.    ROS as in subjective  Objective BP 124/76   Pulse 80   Wt 233 lb 6.4 oz (105.9 kg)   SpO2 99%   BMI 34.47 kg/m   Gen; wd, wn, nad 1+ decreased pulses UE and LE No swelling of UE or LE Heart RRR, normal s1, s2, no murmurs Lungs clear Ext: no edema Tender over right anterior ankle, pain in anterior ankle with ROM which is mildly reduced, no ankle or foot swelling    Assessment: Encounter Diagnoses  Name Primary?  . Diabetes mellitus due to underlying condition with complication, without long-term current use of insulin ([place]) Yes  . Hand numbness   . Coronary artery disease involving native coronary artery of native heart with angina pectoris ([place])   . Deficiency anemia   . Other hyperlipidemia   . Former smoker   . Vitamin D deficiency   . Acute right ankle pain      Plan: Diabetes - c/t current medication, labs today, needs to consider glucose monitoring  Hand numbness - consider differential, possible vascular issue, vitamin  deficiency.  reviewed NCV 9/18 showing no obvious defect.   Consider vascular consult or ABIs, lab today  CAD - reviewed recent cardiology notes in chart.    Anemia - lab today  Hyperlipidemia - c/t same medication, c/t labs today  Right ankle sprain - discussed symptoms, findings.  Advised strengthening and home exercises to use to help stretching and resolve the strain/sprain injury.  F/u in a few weeks    Jennifer Cervantes was seen today for follow-up.  Diagnoses and all orders for this visit:  Diabetes mellitus due  to underlying condition with complication, without long-term current use of insulin (HCC) -     Hemoglobin A1c  Hand numbness -     Folate -     Iron -     Vitamin B12  Coronary artery disease involving native coronary artery of native heart with angina pectoris (HCC) -     Lipid panel -     Hemoglobin A1c  Deficiency anemia -     Folate -     Iron -     Vitamin B12  Other hyperlipidemia  Former smoker  Vitamin D deficiency  Acute right ankle pain

## 2017-02-12 ENCOUNTER — Other Ambulatory Visit: Payer: Self-pay | Admitting: Medical

## 2017-02-12 DIAGNOSIS — E1169 Type 2 diabetes mellitus with other specified complication: Secondary | ICD-10-CM | POA: Insufficient documentation

## 2017-02-12 DIAGNOSIS — E669 Obesity, unspecified: Secondary | ICD-10-CM | POA: Insufficient documentation

## 2017-02-12 LAB — FOLATE: Folate: 5.3 ng/mL — ABNORMAL LOW

## 2017-02-12 LAB — LIPID PANEL
CHOL/HDL RATIO: 4.3 (calc) (ref <5.0)
Cholesterol: 205 mg/dL — ABNORMAL HIGH (ref <200)
HDL: 48 mg/dL — ABNORMAL LOW (ref >50)
LDL CHOLESTEROL (CALC): 134 mg/dL — AB
NON-HDL CHOLESTEROL (CALC): 157 mg/dL — AB (ref <130)
TRIGLYCERIDES: 122 mg/dL (ref <150)

## 2017-02-12 LAB — HEMOGLOBIN A1C
HEMOGLOBIN A1C: 7.3 %{Hb} — AB (ref <5.7)
Mean Plasma Glucose: 163 (calc)
eAG (mmol/L): 9 (calc)

## 2017-02-12 LAB — VITAMIN B12: VITAMIN B 12: 237 pg/mL (ref 200–1100)

## 2017-02-12 LAB — IRON: Iron: 73 ug/dL (ref 40–190)

## 2017-02-12 MED ORDER — METFORMIN HCL 1000 MG PO TABS: 1000.0000 mg | ORAL_TABLET | Freq: Two times a day (BID) | ORAL | 3 refills | Status: DC

## 2017-02-12 MED ORDER — FERROUS GLUCONATE 324 (38 FE) MG PO TABS: 324.0000 mg | ORAL_TABLET | Freq: Every day | ORAL | 1 refills | Status: DC

## 2017-02-12 MED ORDER — INSULIN GLARGINE 100 UNIT/ML SOLOSTAR PEN: 20.0000 [IU] | PEN_INJECTOR | Freq: Every day | SUBCUTANEOUS | 5 refills | Status: DC

## 2017-02-12 MED ORDER — FOLIC ACID 1 MG PO TABS: 1.0000 mg | ORAL_TABLET | Freq: Every day | ORAL | 1 refills | Status: DC

## 2017-02-12 MED ORDER — ROSUVASTATIN CALCIUM 20 MG PO TABS: 20.0000 mg | ORAL_TABLET | Freq: Every day | ORAL | 3 refills | Status: DC

## 2017-02-12 MED ORDER — VITAMIN B-12 1000 MCG PO TABS: 1000.0000 ug | ORAL_TABLET | Freq: Every day | ORAL | 1 refills | Status: DC

## 2017-02-12 MED FILL — FOLIC ACID 1 MG TABLET: 1 | 90 days supply | Qty: 90 | Fill #0

## 2017-02-12 MED FILL — ROSUVASTATIN CALCIUM 20 MG: 20 | 90 days supply | Qty: 90 | Fill #0

## 2017-02-12 MED FILL — metFORMIN HCL 1000 MG TABS: 1000 | 90 days supply | Qty: 180 | Fill #0

## 2017-03-23 DIAGNOSIS — R0789 Other chest pain: Secondary | ICD-10-CM | POA: Diagnosis not present

## 2017-03-23 DIAGNOSIS — Z955 Presence of coronary angioplasty implant and graft: Secondary | ICD-10-CM | POA: Diagnosis not present

## 2017-03-23 DIAGNOSIS — I2510 Atherosclerotic heart disease of native coronary artery without angina pectoris: Secondary | ICD-10-CM | POA: Diagnosis not present

## 2017-03-23 DIAGNOSIS — F17200 Nicotine dependence, unspecified, uncomplicated: Secondary | ICD-10-CM | POA: Diagnosis not present

## 2017-03-23 DIAGNOSIS — I10 Essential (primary) hypertension: Secondary | ICD-10-CM | POA: Diagnosis not present

## 2017-03-23 DIAGNOSIS — I252 Old myocardial infarction: Secondary | ICD-10-CM | POA: Diagnosis not present

## 2017-03-23 DIAGNOSIS — E1165 Type 2 diabetes mellitus with hyperglycemia: Secondary | ICD-10-CM | POA: Diagnosis not present

## 2017-03-23 DIAGNOSIS — J449 Chronic obstructive pulmonary disease, unspecified: Secondary | ICD-10-CM | POA: Diagnosis not present

## 2017-03-23 DIAGNOSIS — E7800 Pure hypercholesterolemia, unspecified: Secondary | ICD-10-CM | POA: Diagnosis not present

## 2017-03-23 DIAGNOSIS — R079 Chest pain, unspecified: Secondary | ICD-10-CM | POA: Diagnosis not present

## 2017-03-26 ENCOUNTER — Encounter: Payer: Self-pay | Admitting: *Deleted

## 2017-03-26 ENCOUNTER — Other Ambulatory Visit: Payer: Self-pay | Admitting: *Deleted

## 2017-03-26 NOTE — Patient Outreach (Addendum)
Spoke with [facility] via her mobile number and completed transition of care assessment. She was admitted to the [facility] in [place] on 03/23/17 with chest pain and received the admitting diagnosis of Atherosclerotic heart disease of native coronary artery without angina pectoris. She was discharged on 03/25/17 to home. Please see the transition of care template for details.  Jennifer Cervantes says her chest pain/chest tightness was not due to a heart attack and she was told to follow up with her cardiologist. She says she is changing cardiologist from Dr. Tamala Julian to Dr. Bronson Ing in [place] due to more convenient office location but she says she cannot be seen until 04/28/17 because she is considered a new patient. She says she told them she was just discharged from the hospital when she called to make the appointment but was told they still could not see her before 04/28/17. Per Epic ( [facility] record) she has a My Chart office visit scheduled with her primary care provider on 03/27/17 at 11:30 am. Jennifer Cervantes is also enrolled in the [facility] digital platform for Type II diabetes management but she says the app doesn't work on her phone. Jennifer Cervantes that she will be withdrawn from the [facility] and a referral will be made to [facility] for ongoing disease management services with the ongoing pharmacy benefits that she was receiving in the [facility] and then [employer].  No ongoing case management needs identified so will close case to [facility] services. Barrington Ellison RN,CCM,CDE [place] Management Coordinator Office Phone [phone] Office Fax [phone]

## 2017-03-27 ENCOUNTER — Encounter: Payer: Self-pay | Admitting: Medical

## 2017-03-27 ENCOUNTER — Ambulatory Visit: Payer: 59 | Admitting: Medical

## 2017-03-27 VITALS — BP 146/82 | HR 82 | Wt 240.0 lb

## 2017-03-27 DIAGNOSIS — I5032 Chronic diastolic (congestive) heart failure: Secondary | ICD-10-CM

## 2017-03-27 DIAGNOSIS — E7849 Other hyperlipidemia: Secondary | ICD-10-CM

## 2017-03-27 DIAGNOSIS — R5383 Other fatigue: Secondary | ICD-10-CM | POA: Diagnosis not present

## 2017-03-27 DIAGNOSIS — I25119 Atherosclerotic heart disease of native coronary artery with unspecified angina pectoris: Secondary | ICD-10-CM

## 2017-03-27 DIAGNOSIS — E088 Diabetes mellitus due to underlying condition with unspecified complications: Secondary | ICD-10-CM

## 2017-03-27 DIAGNOSIS — M79604 Pain in right leg: Secondary | ICD-10-CM

## 2017-03-27 DIAGNOSIS — R0683 Snoring: Secondary | ICD-10-CM | POA: Diagnosis not present

## 2017-03-27 DIAGNOSIS — R942 Abnormal results of pulmonary function studies: Secondary | ICD-10-CM

## 2017-03-27 DIAGNOSIS — E538 Deficiency of other specified B group vitamins: Secondary | ICD-10-CM

## 2017-03-27 DIAGNOSIS — M79601 Pain in right arm: Secondary | ICD-10-CM | POA: Diagnosis not present

## 2017-03-27 DIAGNOSIS — D539 Nutritional anemia, unspecified: Secondary | ICD-10-CM | POA: Diagnosis not present

## 2017-03-27 DIAGNOSIS — R400 Somnolence: Secondary | ICD-10-CM | POA: Insufficient documentation

## 2017-03-27 DIAGNOSIS — E669 Obesity, unspecified: Secondary | ICD-10-CM

## 2017-03-27 DIAGNOSIS — E1169 Type 2 diabetes mellitus with other specified complication: Secondary | ICD-10-CM

## 2017-03-27 DIAGNOSIS — E66813 Obesity, class 3: Secondary | ICD-10-CM

## 2017-03-27 DIAGNOSIS — Z6841 Body Mass Index (BMI) 40.0 and over, adult: Secondary | ICD-10-CM

## 2017-03-27 DIAGNOSIS — R946 Abnormal results of thyroid function studies: Secondary | ICD-10-CM

## 2017-03-27 DIAGNOSIS — S5411XS Injury of median nerve at forearm level, right arm, sequela: Secondary | ICD-10-CM

## 2017-03-27 DIAGNOSIS — E559 Vitamin D deficiency, unspecified: Secondary | ICD-10-CM

## 2017-03-27 NOTE — Progress Notes (Addendum)
Subjective Chief Complaint  Patient presents with  . Follow-up    follow up 1 month  having pain in right leg x 2 months    She reports today with numerous concerns.  She has a history of diabetes, hypertension, hyperlipidemia, coronary artery disease, multiple stents, multiple heart catheterizations, chronic heart failure, former smoker, vitamin D deficiency, obesity, and history of noncompliance at times.   At her last visit in December she was found to have low folate and relatively low B12.  She has a history of anemia.  She is taking iron, she is taking the folate started last visit, taking B12 started last visit.  She has a history of hyperlipidemia, heart disease, CHF-last visit we changed her from Lipitor to Crestor for better efficacy.  She is fasting today for labs.  Diabetes-she is using metformin and 12 units of Lantus daily.  Sugars are running a little high in the 150s-160s.  Last visit she asked to be referred to [facility] cardiology instead of cardiology here in [place].  She says she never heard back about referral  She thinks she may have sleep apnea.  She does snore, there has been concern witnessed apnea, she is fatigued all the time, sleepy during working hours.  She continues to complain of right arm pain throughout, right leg pain throughout, weakness in her right side She has hx/o right artery thrombus in the right arm 2017 reportedly after procedure in 05/2015.  Since then has continues to have numbness in the right hand and distal arm.  Had nerve tests a few months ago showing no nerve damage  She notes she did complete 2-3 months of cardiac rehab this past year.  Past Medical History:  Diagnosis Date  . CAD (coronary artery disease)    a. LHC on 06/21/15 which showed 75% occl mid-dist LCx, 40% occl mRCA, 99% mid-dist LAD s/p DES and 50% occl mLAD s/p DES (overlapping stents) and significant LV dysfunction with anteroapical HK; b. 06/2015 Myoview: EF 55-65%, no  ischemia/infarct.  . CHF (congestive heart failure) ([place])   . GERD (gastroesophageal reflux disease)   . Hyperlipidemia   . Hypertension   . Hypothyroidism   . Myocardial stunning (HCC)    a. EF 45% on 2D ECHO on 06/21/15 and severe LV dysfunction on LV gram by cath. Repeat 2D ECHO with EF 55-60% and mild HK of the apicoseptal myocardium.  [name] Neuropraxia of right median nerve    a. suspected after right radial cath. will follow with neuro outpatient if does not resolve.   . NSTEMI (non-ST elevated myocardial infarction) [facility]) 05/2015   [facility]  . Obesity   . 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 05/2015 Following PCI (radial access)-->conservatively managed.  . Tobacco abuse   . Tricuspid regurgitation   . Type II diabetes mellitus ([place])    a. Type II  . Vitamin D deficiency    Current Outpatient Medications on File Prior to Visit  Medication Sig Dispense Refill  . acetaminophen (TYLENOL) 500 MG tablet Take 500 mg by mouth every 6 (six) hours as needed for headache (pain).    [name] amLODipine (NORVASC) 5 MG tablet Take 1 tablet (5 mg total) by mouth daily. 90 tablet 3  . aspirin 81 MG EC tablet Take 1 tablet (81 mg total) by mouth daily. 90 tablet 3  . Cholecalciferol (VITAMIN D) 2000 units tablet Take 1 tablet (2,000 Units total) by mouth daily. 90 tablet 3  .  clopidogrel (PLAVIX) 75 MG tablet Take 75 mg by mouth 2 (two) times daily.    . ferrous gluconate (FERGON) 324 MG tablet Take 1 tablet (324 mg total) by mouth daily with breakfast. 90 tablet 1  . fluticasone furoate-vilanterol (BREO ELLIPTA) 200-25 MCG/INH AEPB Inhale 1 puff into the lungs daily. 28 each 0  . folic acid (FOLVITE) 1 MG tablet Take 1 tablet (1 mg total) by mouth daily. 90 tablet 1  . Insulin Glargine (LANTUS SOLOSTAR) 100 UNIT/ML Solostar Pen Inject 20 Units into the skin daily at 10 pm. 5 pen 5  . linaclotide (LINZESS) 145 MCG CAPS capsule Take 1 capsule (145 mcg total) by mouth daily before breakfast. 90  capsule 1  . lisinopril-hydrochlorothiazide (PRINZIDE,ZESTORETIC) 20-25 MG tablet Take 0.5 tablets by mouth daily. 45 tablet 3  . metFORMIN (GLUCOPHAGE) 1000 MG tablet Take 1 tablet (1,000 mg total) by mouth 2 (two) times daily with a meal. 180 tablet 3  . metoprolol tartrate (LOPRESSOR) 100 MG tablet Take 50 mg by mouth 2 (two) times daily.    [name] oxyCODONE (OXY IR/ROXICODONE) 5 MG immediate release tablet Take 5 mg by mouth every 4 (four) hours as needed for breakthrough pain.    . pregabalin (LYRICA) 50 MG capsule Take 1 capsule (50 mg total) by mouth 3 (three) times daily. 90 capsule 5  . promethazine (PHENERGAN) 12.5 MG tablet Take 1 tablet (12.5 mg total) by mouth every 6 (six) hours as needed for nausea or vomiting. 15 tablet 0  . rosuvastatin (CRESTOR) 20 MG tablet Take 1 tablet (20 mg total) by mouth at bedtime. 90 tablet 3  . UNIFINE PENTIPS 32G X 4 MM MISC USE AS DIRECTED (USE WITH LANTUS PEN) 100 each 5  . vitamin B-12 (CYANOCOBALAMIN) 1000 MCG tablet Take 1 tablet (1,000 mcg total) by mouth daily. 90 tablet 1  . colchicine 0.6 MG tablet Take 1 tablet (0.6 mg total) by mouth daily for 15 days. On day 1, take 1 tab twice daily, then 1 tab daily starting on day 2. 15 tablet 0  . nitroGLYCERIN (NITROSTAT) 0.4 MG SL tablet Place 1 tablet (0.4 mg total) under the tongue every 5 (five) minutes as needed for chest pain. (Patient not taking: Reported on 03/27/2017) 25 tablet 3   No current facility-administered medications on file prior to visit.    Past Surgical History:  Procedure Laterality Date  . [place] STUDY N/A 07/14/2016   Procedure: [place] STUDY;  Surgeon: Mauri Pole, MD;  Location: [facility];  Service: Endoscopy;  Laterality: N/A;  . CARDIAC CATHETERIZATION N/A 06/21/2015   Procedure: Left Heart Cath and Coronary Angiography;  Surgeon: Belva Crome, MD; pLAD 99%, CFX 75%, RCA 40%, EF 35%   . CARDIAC CATHETERIZATION N/A 06/21/2015   Procedure: Coronary Stent  Intervention;  Surgeon: Belva Crome, MD;  Promus Premier DES, 3.0 x 20 and 3.5 x 32 mm stents to the LAD  . CARDIAC CATHETERIZATION N/A 06/21/2015   Procedure: Intravascular Ultrasound/IVUS;  Surgeon: Belva Crome, MD;  Location: [place] CV [facility];  Service: Cardiovascular;  Laterality: N/A;  LAD  . CARDIAC CATHETERIZATION  06/22/2015   Procedure: Coronary/Graft Angiography;  Surgeon: Peter M Martinique, MD;  Location: [facility];  Service: Cardiovascular;;  . CORONARY STENT INTERVENTION N/A 04/02/2016   Procedure: Coronary Stent Intervention;  Surgeon: Jettie Booze, MD;  Location: [place] CV LAB;  Service: Cardiovascular;  Laterality: N/A;  . ESOPHAGEAL MANOMETRY N/A  07/14/2016   Procedure: ESOPHAGEAL MANOMETRY (EM);  Surgeon: Mauri Pole, MD;  Location: [facility];  Service: Endoscopy;  Laterality: N/A;  . LEFT HEART CATH AND CORONARY ANGIOGRAPHY N/A 04/02/2016   Procedure: Left Heart Cath and Coronary Angiography;  Surgeon: Jettie Booze, MD;  Location: [place] [facility] LAB;  Service: Cardiovascular;  Laterality: N/A;  . LEFT HEART CATH AND CORONARY ANGIOGRAPHY N/A 07/17/2016   Procedure: Left Heart Cath and Coronary Angiography;  Surgeon: Leonie Man, MD;  Location: [place] CV LAB;  Service: Cardiovascular;  Laterality: N/A;   ROS as in subjective   Objective: BP (!) 146/82   Pulse 82   Wt 240 lb (108.9 kg)   SpO2 99%   BMI 35.44 kg/m   Gen; wd, wn, nad 1+ decreased pulses UE and LE No swelling of UE or LE, there are some mild varicosities palpable of right calve region Heart RRR, normal s1, s2, no murmurs Lungs clear Ext: no edema Tender over right forearm, tender right thigh, otherwise UE and LE nontender, no other deformity    Assessment: Encounter Diagnoses  Name Primary?  . Fatigue, unspecified type Yes  . Abnormal thyroid function test   . Deficiency anemia   . Other hyperlipidemia   . Right arm pain   . Right leg pain   .  Snoring   . Daytime somnolence   . Chronic diastolic heart failure ([place])   . Coronary artery disease involving native coronary artery of native heart with angina pectoris ([place])   . Diabetes mellitus due to underlying condition with complication, without long-term current use of insulin ([place])   . Diabetes mellitus type 2 in obese ([place])   . Neurapraxia of right median nerve, sequela   . Abnormal PFT   . Class 3 severe obesity due to excess calories with serious comorbidity and body mass index (BMI) of 40.0 to 44.9 in adult [facility])   . Vitamin D deficiency   . Folate deficiency     Plan: Very complicated case, and too many issues to try and cram in today's visit which was suppose to just be f/u on labs and med changes from last visit .  History of anemia, folate deficiency-labs as below, continue supplemental iron, folate, vitamin D, B12  Fatigue-discussed the long list of possible causes including known factors she has such as heart disease, likely COPD versus asthma, history of abnormal thyroid labs but not specifically known hypothyroidism, anemia, vitamin D deficiency, obesity, possibly sleep apnea snoring,  Fatigue, daytime somnolence-consider sleep study  Obesity- continue to work on efforts with diet and exercise to lose weight  Hyperlipidemia-labs today, last visit we changed from Lipitor to Crestor and she is compliant with this  Abnormal PFT- she had questions about her diagnosis whether or not she has COPD.  She is a former smoker, prior baseline basic PFT performed a couple years ago was abnormal, chest x-ray last year was unremarkable in regards to the lungs.   she currently is taking medications Breo and albuterol, but not sure she sees a benefit of this.  Given her long list of concerns today, it may be helpful to send her to pulmonology for consult to give her for sure diagnosis since we do not have the capability of a full spirometry here.  Follow-up pending call back on  labs  Heart disease-referral again to cardiology in [place] at her request  Diabetes-continue same medication except advised that she can increase the Lantus 2 units/week  until morning sugars running under 130.  Chronic right arm pain, chronic right leg pain- I reviewed the 10/31/16 nerve conduction studies of the right upper extremity I reviewed the 07/28/16 venous Doppler which was negative of the right lower extremity I reviewed the notes from the 08/06/16 vascular ultrasound arterial evaluation of the right lower extremity I reviewed the 11/12/16 neurology consult regarding her right arm and right leg Consider orthopedic referral or chronic pain referral  Jennifer Cervantes was seen today for follow-up.  Diagnoses and all orders for this visit:  Fatigue, unspecified type  Abnormal thyroid function test -     TSH -     T4, free  Deficiency anemia -     Folate  Other hyperlipidemia -     Lipid panel -     ALT  Right arm pain  Right leg pain  Snoring  Daytime somnolence  Chronic diastolic heart failure (HCC)  Coronary artery disease involving native coronary artery of native heart with angina pectoris (HCC)  Diabetes mellitus due to underlying condition with complication, without long-term current use of insulin (HCC)  Diabetes mellitus type 2 in obese (HCC)  Neurapraxia of right median nerve, sequela  Abnormal PFT  Class 3 severe obesity due to excess calories with serious comorbidity and body mass index (BMI) of 40.0 to 44.9 in adult [facility])  Vitamin D deficiency  Folate deficiency

## 2017-03-28 LAB — LIPID PANEL
CHOLESTEROL TOTAL: 146 mg/dL (ref 100–199)
Chol/HDL Ratio: 2.8 ratio (ref 0.0–4.4)
HDL: 52 mg/dL (ref >39)
LDL CALC: 77 mg/dL (ref 0–99)
Triglycerides: 85 mg/dL (ref 0–149)
VLDL CHOLESTEROL CAL: 17 mg/dL (ref 5–40)

## 2017-03-28 LAB — TSH: TSH: 0.475 u[IU]/mL (ref 0.450–4.500)

## 2017-03-28 LAB — FOLATE: Folate: 6.1 ng/mL (ref >3.0)

## 2017-03-28 LAB — ALT: ALT: 23 IU/L (ref 0–32)

## 2017-03-28 LAB — T4, FREE: Free T4: 1.19 ng/dL (ref 0.82–1.77)

## 2017-04-01 ENCOUNTER — Encounter: Payer: Self-pay | Admitting: Medical

## 2017-04-01 NOTE — Addendum Note (Signed)
Addended by: Tyrone Apple on: 04/01/2017 04:53 PM   Modules accepted: Orders

## 2017-04-16 ENCOUNTER — Encounter: Payer: Self-pay | Admitting: Internal Medicine

## 2017-04-16 ENCOUNTER — Ambulatory Visit (INDEPENDENT_AMBULATORY_CARE_PROVIDER_SITE_OTHER)
Admission: RE | Admit: 2017-04-16 | Discharge: 2017-04-16 | Disposition: A | Discharge status: Home / Self Care | Payer: 59 | Source: Ambulatory Visit | Attending: Internal Medicine | Admitting: Internal Medicine

## 2017-04-16 ENCOUNTER — Ambulatory Visit (INDEPENDENT_AMBULATORY_CARE_PROVIDER_SITE_OTHER): Payer: 59 | Admitting: Internal Medicine

## 2017-04-16 VITALS — BP 114/72 | HR 94 | Ht 69.0 in | Wt 239.0 lb

## 2017-04-16 DIAGNOSIS — I10 Essential (primary) hypertension: Secondary | ICD-10-CM | POA: Diagnosis not present

## 2017-04-16 DIAGNOSIS — R0602 Shortness of breath: Secondary | ICD-10-CM | POA: Diagnosis not present

## 2017-04-16 DIAGNOSIS — R0609 Other forms of dyspnea: Secondary | ICD-10-CM

## 2017-04-16 DIAGNOSIS — R0600 Dyspnea, unspecified: Secondary | ICD-10-CM

## 2017-04-16 NOTE — Progress Notes (Signed)
Subjective:     Patient ID: Jennifer Cervantes, female   DOB: 05-05-1980,   MRN: [id number]  HPI  21 yobf quit smoking 2017 and healthy most of her life x issues with wt and at  last IUP 2004 after lost all the excess baby wt still weighed around 330 then dx dm 2011 but still enjoyed good activity tol then bad sweats /vomiting 06/21/2015 > ER > dx IHD>> Stents done  and then says w/in an hour or two of cath developed cp ?>  Repeat cath next day = 06/22/15 no lesions  > sweating and vomiting completey resolved but cp continued and has persisted daily ever since then so referred to pulmonary clinic 04/16/2017 by Jennifer   Glade Cervantes   04/16/2017 1st [facility] office visit/ Allahna Cervantes   Chief Complaint  Patient presents with  . Pulmonary Consult    Referred by Jennifer Cervantes. Pt c/o SOB off and on since had cardiac stent placed in 2017. She states she occ gets SOB at rest but mainly with walking a long distance. She has CP every that she states is constant and both sharp and dull.   sob and cp have persisted ever daysince the stents placed which I think correlates exactly with rx with ACEi Cp is diffuse / ant L > R does not radiate / sometimes with nausea - note she really hasn't focused on this during her cards eval the latest of which 02/06/17 states "Cardiac wise she has some exertional dyspnea.  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 No unusual exposure hx or h/o childhood pna/ asthma or knowledge of premature birth.  Sleeping ok flat without nocturnal  or early am exacerbation   of respiratory  c/o's or need for noct saba. Also denies any obvious fluctuation of symptoms with weather or environmental changes or other aggravating or alleviating factors except as outlined above   Current Allergies, Complete Past Medical History, Past Surgical History, Family History, and Social History were reviewed in [employer] record.  ROS  The following are not active complaints unless bolded Hoarseness, sore throat, dysphagia, dental problems, itching, sneezing,  nasal congestion or discharge of excess mucus or purulent secretions, ear ache,   fever, chills, sweats, unintended wt loss or wt gain, classically pleuritic or exertional cp,  orthopnea pnd or leg swelling, presyncope, palpitations, abdominal pain, anorexia, nausea, vomiting, diarrhea  or change in bowel habits or change in bladder habits, change in stools or change in urine, dysuria, hematuria,  rash, arthralgias, visual complaints, headache, numbness, weakness or ataxia or problems with walking or coordination,  change in mood/affect or memory.        Current Meds  Medication Sig  . acetaminophen (TYLENOL) 500 MG tablet Take 500 mg by mouth every 6 (six) hours as needed for headache (pain).  [name] amLODipine (NORVASC) 5 MG tablet Take 1 tablet (5 mg total) by mouth daily.  [name] aspirin 81 MG EC tablet Take 1 tablet (81 mg total) by  mouth daily.  . Cholecalciferol (VITAMIN D) 2000 units tablet Take 1 tablet (2,000 Units total) by mouth daily.  . clopidogrel (PLAVIX) 75 MG tablet Take 75 mg by mouth 2 (two) times daily.  . ferrous gluconate (FERGON) 324 MG tablet Take 1 tablet (324 mg total) by mouth daily with breakfast.  . folic acid (FOLVITE) 1 MG tablet Take 1 tablet (1 mg total) by mouth daily.  . Insulin Glargine (LANTUS SOLOSTAR) 100 UNIT/ML Solostar Pen Inject 20 Units into the skin daily at 10 pm.  . linaclotide (LINZESS) 145 MCG CAPS capsule Take 1 capsule (145 mcg total) by mouth daily before  breakfast.  . metFORMIN (GLUCOPHAGE) 1000 MG tablet Take 1 tablet (1,000 mg total) by mouth 2 (two) times daily with a meal.  . metoprolol tartrate (LOPRESSOR) 100 MG tablet Take 50 mg by mouth 2 (two) times daily.  . nitroGLYCERIN (NITROSTAT) 0.4 MG SL tablet Place 1 tablet (0.4 mg total) under the tongue every 5 (five) minutes as needed for chest pain.  [name] oxyCODONE (OXY IR/ROXICODONE) 5 MG immediate release tablet Take 5 mg by mouth every 4 (four) hours as needed for breakthrough pain.  . pregabalin (LYRICA) 50 MG capsule Take 1 capsule (50 mg total) by mouth 3 (three) times daily.  . promethazine (PHENERGAN) 12.5 MG tablet Take 1 tablet (12.5 mg total) by mouth every 6 (six) hours as needed for nausea or vomiting.  . rosuvastatin (CRESTOR) 20 MG tablet Take 1 tablet (20 mg total) by mouth at bedtime.  [name] UNIFINE PENTIPS 32G X 4 MM MISC USE AS DIRECTED (USE WITH LANTUS PEN)  . vitamin B-12 (CYANOCOBALAMIN) 1000 MCG tablet Take 1 tablet (1,000 mcg total) by mouth daily.  . [DISCONTINUED] lisinopril-hydrochlorothiazide (PRINZIDE,ZESTORETIC) 20-25 MG tablet Take 0.5 tablets by mouth daily.            Review of Systems     Objective:   Physical Exam Obese bm ? Belle affect   Wt Readings from Last 3 Encounters:  04/16/17 239 lb (108.4 kg)  03/27/17 240 lb (108.9 kg)  02/11/17 233 lb 6.4 oz (105.9 kg)     Vital signs reviewed - Note on arrival 02 sats  97% on RA      HEENT: nl dentition, turbinates bilaterally, and oropharynx. Nl external ear canals without cough reflex   NECK :  without JVD/Nodes/TM/ nl carotid upstrokes bilaterally   LUNGS: no acc muscle use,  Nl contour chest which is clear to A and P bilaterally without cough on insp or exp maneuvers   CV:  RRR  no s3 or murmur or increase in P2, and no edema   ABD:  Quite obese but soft and nontender with nl inspiratory excursion in the supine position. No bruits or organomegaly appreciated, bowel sounds nl  MS:  Nl  gait/ ext warm without deformities, calf tenderness, cyanosis or clubbing No obvious joint restrictions   SKIN: warm and dry without lesions    NEURO:  alert, approp, nl sensorium with  no motor or cerebellar deficits apparent.      CXR Cervantes and Lateral:   04/16/2017 :    I personally reviewed images and agree with radiology impression as follows:   No active cardiopulmonary disease.     Assessment:

## 2017-04-16 NOTE — Patient Instructions (Addendum)
Stop lisinopril   Start losartan 100-25 mg one daily   Continue protonix but Take 30- 60 min before your first and last meals of the day   GERD (REFLUX)  is an extremely common cause of respiratory symptoms just like yours , many times with no obvious heartburn at all.    It can be treated with medication, but also with lifestyle changes including elevation of the head of your bed (ideally with 6 inch  bed blocks),  Smoking cessation, avoidance of late meals, excessive alcohol, and avoid fatty foods, chocolate, peppermint, colas, red wine, and acidic juices such as orange juice.  NO MINT OR MENTHOL PRODUCTS SO NO COUGH DROPS   USE SUGARLESS CANDY INSTEAD (Jolley ranchers or Stover's or Life Savers) or even ice chips will also do - the key is to swallow to prevent all throat clearing. NO OIL BASED VITAMINS - use powdered substitutes.   Please remember to go to the  x-ray department downstairs in the basement  for your tests - we will call you with the results when they are available.       Please schedule a follow up office visit in 6 weeks, call sooner if needed

## 2017-04-17 NOTE — Progress Notes (Signed)
Was able to talk to the patient regarding their results.  They verbalized an understanding of what was discussed. No further questions at this time. 

## 2017-04-18 ENCOUNTER — Encounter: Payer: Self-pay | Admitting: Internal Medicine

## 2017-04-18 DIAGNOSIS — I10 Essential (primary) hypertension: Secondary | ICD-10-CM | POA: Insufficient documentation

## 2017-04-18 MED ORDER — LOSARTAN POTASSIUM-HCTZ 100-25 MG PO TABS: 1.0000 | ORAL_TABLET | Freq: Every day | ORAL | 11 refills | Status: DC

## 2017-04-18 NOTE — Assessment & Plan Note (Signed)
In the best review of chronic cough to date ( NEJM 2016 375 [phone]) ,  ACEi are now felt to cause cough in up to  20% of pts which is a 4 fold increase from previous reports and does not include the variety of non-specific complaints we see in pulmonary clinic in pts on ACEi but previously attributed to another dx like  Copd/asthma and  include PNDS, throat and chest congestion, "bronchitis", unexplained dyspnea and noct "strangling" sensations, and hoarseness, but also  atypical /refractory GERD symptoms like dysphagia and "bad heartburn" despite max gerd rx   The only way I know  to prove this is not an "ACEi Case" is a trial off ACEi x a minimum of 6 weeks then regroup.   Try losartan 100-25 and f/u in 6 weeks

## 2017-04-18 NOTE — Assessment & Plan Note (Signed)
04/16/2017  Walked RA x 3 laps @ 185 ft each stopped due to  End of study, mild doe at fast pace, no desat, some sensation of chest tightness - Spirometry 04/16/2017  Nl with only abn = early (effort dep) portion of f/v loop  - trial off acei 04/16/2017   Symptoms are markedly disproportionate to objective findings and not clear this is actually much of a  lung problem but pt does appear to have difficult to sort out respiratory symptoms of unknown origin for which  DDX  = almost all start with A and  include Adherence, Ace Inhibitors, Acid Reflux, Active Sinus Disease, Alpha 1 Antitripsin deficiency, Anxiety masquerading as Airways dz,  ABPA,  Allergy(esp in young), Aspiration (esp in elderly), Adverse effects of meds,  Active smokers, A bunch of PE's (a small clot burden can't cause this syndrome unless there is already severe underlying pulm or vascular dz with poor reserve) plus two Bs  = Bronchiectasis and Beta blocker use..and one C= CHF     Adherence is always the initial "prime suspect" and is a multilayered concern that requires a "trust but verify" approach in every patient - starting with knowing how to use medications, especially inhalers, correctly, keeping up with refills and understanding the fundamental difference between maintenance and prns vs those medications only taken for a very short course and then stopped and not refilled.   ACEi adverse effects at the  top of the usual list of suspects and the only way to rule it out is a trial off > see a/p    ? Acid (or non-acid) GERD > always difficult to exclude as up to 75% of pts in some series report no assoc GI/ Heartburn symptoms> rec max (24h)  acid suppression and diet restrictions/ reviewed and instructions given in writing.    ? Anxiety /depression > usually at the bottom of this list of usual suspects but should be much higher on this pt's based on H and P and note already on psychotropics .   ? Chf/ ihd > multiple neg w/u's since  MI, f/u by cards actively    Total time devoted to counseling  > 50 % of initial 60 min office visit:  review case with pt/ discussion of options/alternatives/ personally creating written customized instructions  in presence of pt  then going over those specific  Instructions directly with the pt including how to use all of the meds but in particular covering each new medication in detail and the difference between the maintenance= "automatic" meds and the prns using an action plan format for the latter (If this problem/symptom => do that organization reading Left to right).  Please see AVS from this visit for a full list of these instructions which I personally wrote for this pt and  are unique to this visit.

## 2017-04-22 IMAGING — DX DG CHEST 2V
2 series · 2 of 2 positions shown · non-contrast
Comparison: Radiographs and CT 06/21/2015

CLINICAL DATA: Central chest pain.

EXAM:
CHEST  2 VIEW

[chest pa]
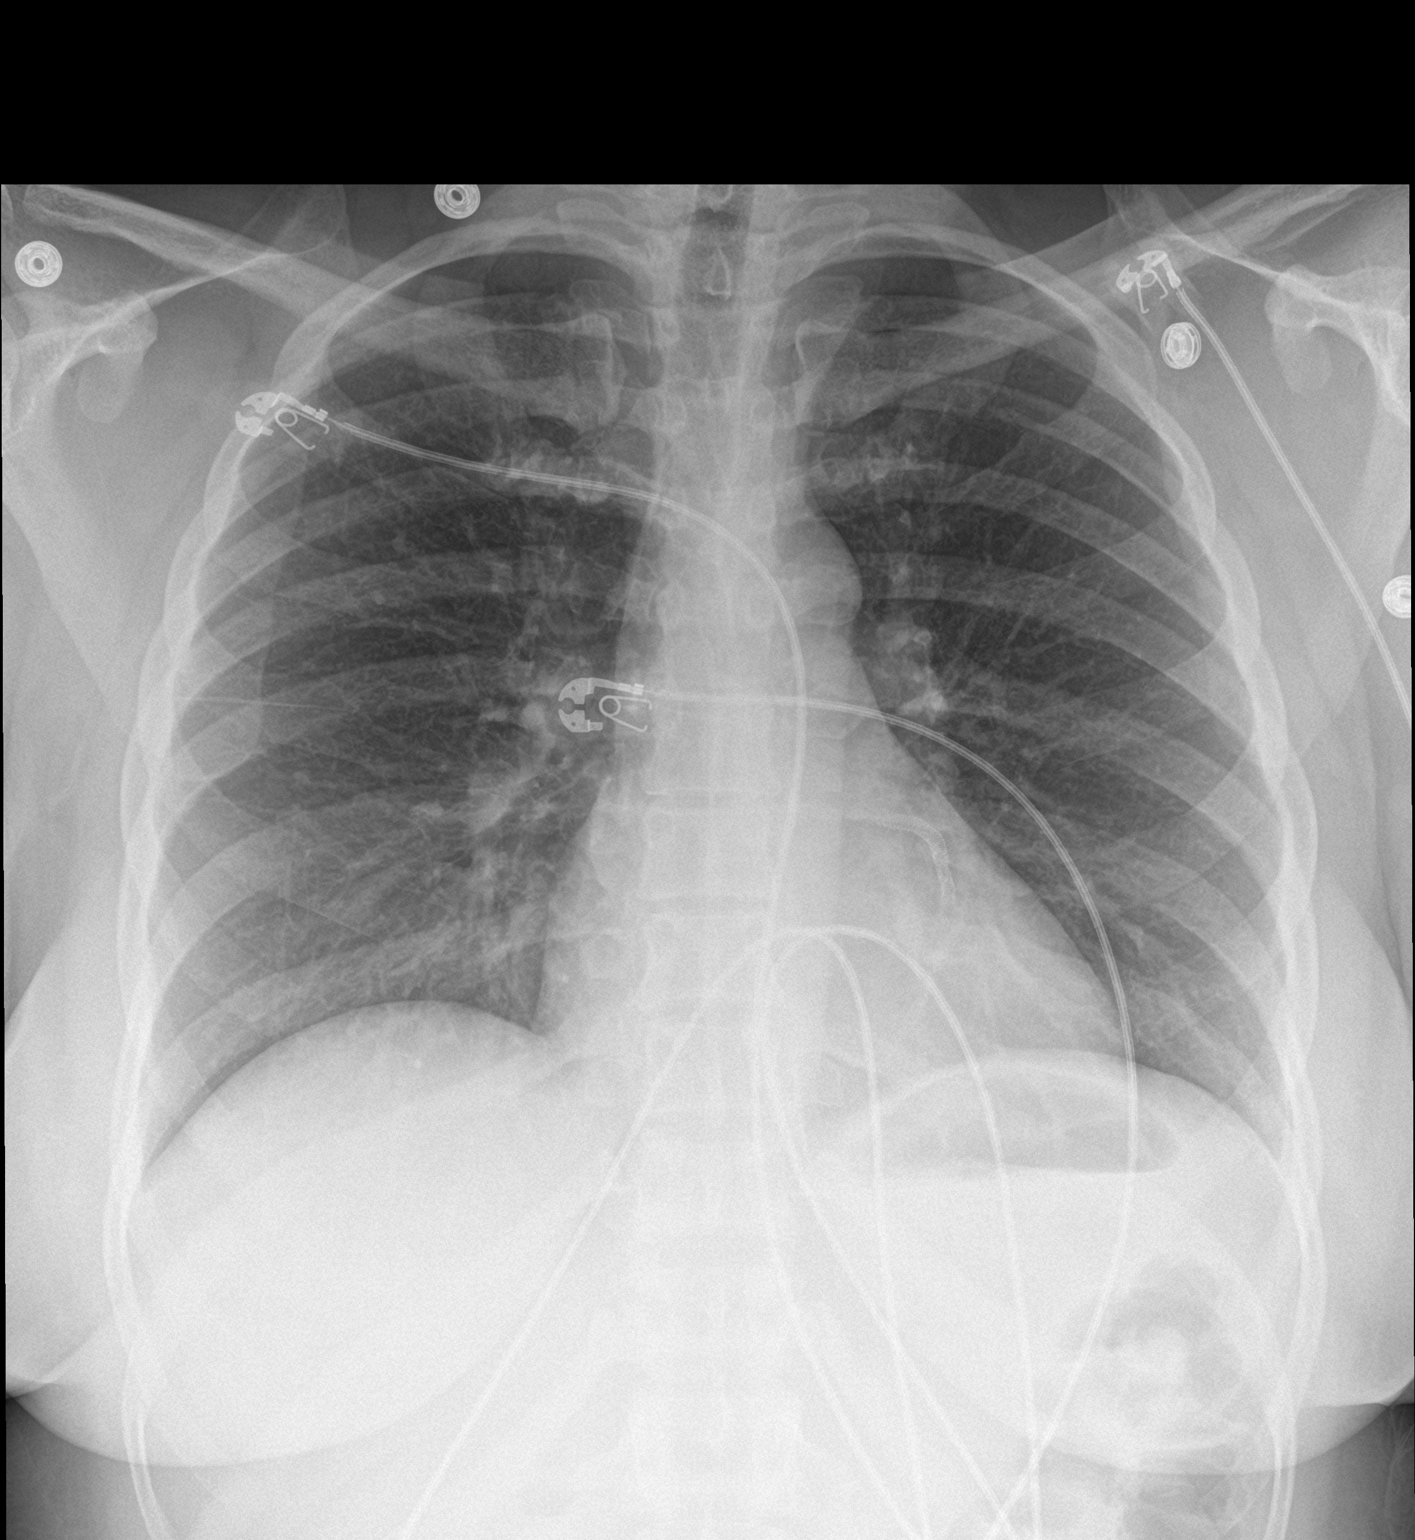

[chest lat]
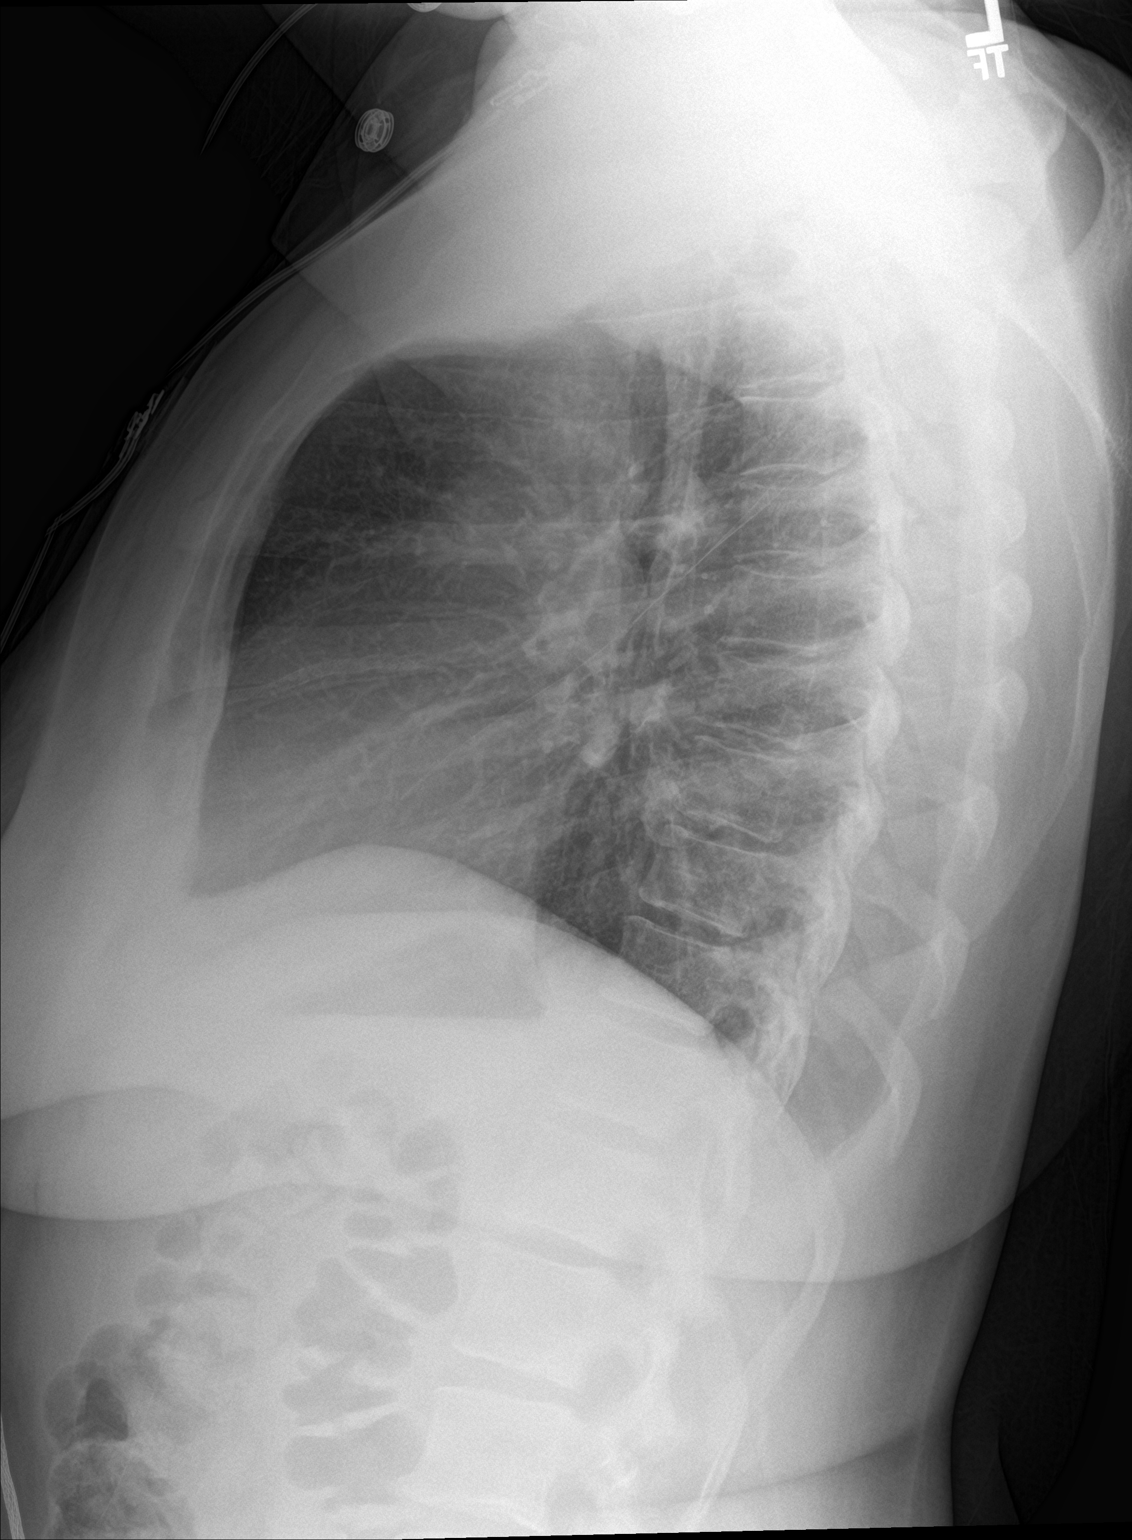

[2 of 2 positions shown; findings below may reference images not displayed]

FINDINGS: The cardiomediastinal contours are normal. Coronary stent in place.
The lungs are clear. Pulmonary vasculature is normal. No
consolidation, pleural effusion, or pneumothorax. No acute osseous
abnormalities are seen.
IMPRESSION: No acute pulmonary process.  Coronary stent in place.

## 2017-04-24 MED FILL — FREESTYLE LIBRE 14 DAY SENS: 28 days supply | Qty: 2 | Fill #0

## 2017-04-24 MED FILL — FREESTYLE LIBRE 14 DAY READ: 1 days supply | Qty: 1 | Fill #0

## 2017-04-28 ENCOUNTER — Encounter: Payer: Self-pay | Admitting: Cardiovascular Disease

## 2017-04-28 ENCOUNTER — Ambulatory Visit (INDEPENDENT_AMBULATORY_CARE_PROVIDER_SITE_OTHER): Payer: 59 | Admitting: Cardiovascular Disease

## 2017-04-28 VITALS — BP 112/72 | HR 74 | Ht 69.0 in | Wt 236.0 lb

## 2017-04-28 DIAGNOSIS — I5032 Chronic diastolic (congestive) heart failure: Secondary | ICD-10-CM

## 2017-04-28 DIAGNOSIS — E785 Hyperlipidemia, unspecified: Secondary | ICD-10-CM

## 2017-04-28 DIAGNOSIS — R002 Palpitations: Secondary | ICD-10-CM | POA: Diagnosis not present

## 2017-04-28 DIAGNOSIS — I10 Essential (primary) hypertension: Secondary | ICD-10-CM | POA: Diagnosis not present

## 2017-04-28 DIAGNOSIS — I25119 Atherosclerotic heart disease of native coronary artery with unspecified angina pectoris: Secondary | ICD-10-CM

## 2017-04-28 DIAGNOSIS — R0601 Orthopnea: Secondary | ICD-10-CM | POA: Diagnosis not present

## 2017-04-28 NOTE — Patient Instructions (Addendum)
Your physician wants you to follow-up in: 2 months with Dr Bronson Ing     Your physician has requested that you have an echocardiogram. Echocardiography is a painless test that uses sound waves to create images of your heart. It provides your doctor with information about the size and shape of your heart and how well your heart's chambers and valves are working. This procedure takes approximately one hour. There are no restrictions for this procedure.    Your physician recommends that you continue on your current medications as directed. Please refer to the Current Medication list given to you today.   If you need a refill on your cardiac medications before your next appointment, please call your pharmacy.      No lab work ordered today     Thank you for choosing [facility] !

## 2017-04-28 NOTE — Progress Notes (Signed)
SUBJECTIVE: Patient presents for follow-up of coronary artery disease.  She underwent overlapping drug-eluting stent placement to the proximal, mid, and distal LAD.  Initial overlapping stents to the proximal and mid LAD were placed in April 2017.  He distal LAD stent was placed in February 2018.  She has 3 stents altogether.   Coronary angiography on 07/17/16 showed minimal in-stent restenosis.  There is 50% stenosis of the mid to distal circumflex and there is a proximal 30% LAD stenosis proximal to a long stented segment.  Left ventricular end-diastolic pressure and function were normal, LVEF 55-65%. I initially consulted on her in April 2017.  Additional past medical history includes type 2 diabetes mellitus, hypertension, and hyperlipidemia.  She has been following with Dr. Tamala Julian but now presents to the [place] office for feasibility.  She has undergone several cardiac investigations and I will refer you to Dr. Thompson Caul very detailed notes.  She underwent an event monitor for syncope from 04/15/16-05/14/16.  It was normal with no arrhythmias found.  Most recent echocardiogram was performed in February 2018 and demonstrated normal left ventricular systolic function, LVEF 76-16%, mild LVH, and grade 2 diastolic dysfunction.  She had been on Imdur in the past but was discontinued in the summer 2018 due to near syncope.  She said she has been struggling with dizziness after standing for 5 minutes.  She also complains of palpitations when standing for longer than 5 minutes.  She has occasional chest tightness associated shortness of breath which can occur both with rest and with exertion.  She said she uses 6 pillows to sleep.    Review of Systems: As per "subjective", otherwise negative.  No Known Allergies  Current Outpatient Medications  Medication Sig Dispense Refill  . acetaminophen (TYLENOL) 500 MG tablet Take 500 mg by mouth every 6 (six) hours as needed for headache (pain).     [name] amLODipine (NORVASC) 5 MG tablet Take 1 tablet (5 mg total) by mouth daily. 90 tablet 3  . aspirin 81 MG EC tablet Take 1 tablet (81 mg total) by mouth daily. 90 tablet 3  . Cholecalciferol (VITAMIN D) 2000 units tablet Take 1 tablet (2,000 Units total) by mouth daily. 90 tablet 3  . clopidogrel (PLAVIX) 75 MG tablet Take 75 mg by mouth 2 (two) times daily.    . ferrous gluconate (FERGON) 324 MG tablet Take 1 tablet (324 mg total) by mouth daily with breakfast. 90 tablet 1  . folic acid (FOLVITE) 1 MG tablet Take 1 tablet (1 mg total) by mouth daily. 90 tablet 1  . Insulin Glargine (LANTUS SOLOSTAR) 100 UNIT/ML Solostar Pen Inject 20 Units into the skin daily at 10 pm. 5 pen 5  . linaclotide (LINZESS) 145 MCG CAPS capsule Take 1 capsule (145 mcg total) by mouth daily before breakfast. 90 capsule 1  . losartan-hydrochlorothiazide (HYZAAR) 100-25 MG tablet Take 1 tablet by mouth daily. 30 tablet 11  . metFORMIN (GLUCOPHAGE) 1000 MG tablet Take 1 tablet (1,000 mg total) by mouth 2 (two) times daily with a meal. 180 tablet 3  . metoprolol tartrate (LOPRESSOR) 100 MG tablet Take 50 mg by mouth 2 (two) times daily.    . nitroGLYCERIN (NITROSTAT) 0.4 MG SL tablet Place 1 tablet (0.4 mg total) under the tongue every 5 (five) minutes as needed for chest pain. 25 tablet 3  . oxyCODONE (OXY IR/ROXICODONE) 5 MG immediate release tablet Take 5 mg by mouth every 4 (four) hours as needed for  breakthrough pain.    . pregabalin (LYRICA) 50 MG capsule Take 1 capsule (50 mg total) by mouth 3 (three) times daily. 90 capsule 5  . promethazine (PHENERGAN) 12.5 MG tablet Take 1 tablet (12.5 mg total) by mouth every 6 (six) hours as needed for nausea or vomiting. 15 tablet 0  . rosuvastatin (CRESTOR) 20 MG tablet Take 1 tablet (20 mg total) by mouth at bedtime. 90 tablet 3  . UNIFINE PENTIPS 32G X 4 MM MISC USE AS DIRECTED (USE WITH LANTUS PEN) 100 each 5  . vitamin B-12 (CYANOCOBALAMIN) 1000 MCG tablet Take 1 tablet  (1,000 mcg total) by mouth daily. 90 tablet 1   No current facility-administered medications for this visit.     Past Medical History:  Diagnosis Date  . CAD (coronary artery disease)    a. LHC on 06/21/15 which showed 75% occl mid-dist LCx, 40% occl mRCA, 99% mid-dist LAD s/p DES and 50% occl mLAD s/p DES (overlapping stents) and significant LV dysfunction with anteroapical HK; b. 06/2015 Myoview: EF 55-65%, no ischemia/infarct.  . CHF (congestive heart failure) ([place])   . GERD (gastroesophageal reflux disease)   . Hyperlipidemia   . Hypertension   . Hypothyroidism   . Myocardial stunning (HCC)    a. EF 45% on 2D ECHO on 06/21/15 and severe LV dysfunction on LV gram by cath. Repeat 2D ECHO with EF 55-60% and mild HK of the apicoseptal myocardium.  [name] Neuropraxia of right median nerve    a. suspected after right radial cath. will follow with neuro outpatient if does not resolve.   . NSTEMI (non-ST elevated myocardial infarction) [facility]) 05/2015   [facility]  . Obesity   . 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 05/2015 Following PCI (radial access)-->conservatively managed.  . Tobacco abuse   . Tricuspid regurgitation   . Type II diabetes mellitus ([place])    a. Type II  . Vitamin D deficiency     Past Surgical History:  Procedure Laterality Date  . [place] STUDY N/A 07/14/2016   Procedure: [place] STUDY;  Surgeon: Mauri Pole, MD;  Location: [facility];  Service: Endoscopy;  Laterality: N/A;  . CARDIAC CATHETERIZATION N/A 06/21/2015   Procedure: Left Heart Cath and Coronary Angiography;  Surgeon: Belva Crome, MD; pLAD 99%, CFX 75%, RCA 40%, EF 35%   . CARDIAC CATHETERIZATION N/A 06/21/2015   Procedure: Coronary Stent Intervention;  Surgeon: Belva Crome, MD;  Promus Premier DES, 3.0 x 20 and 3.5 x 32 mm stents to the LAD  . CARDIAC CATHETERIZATION N/A 06/21/2015   Procedure: Intravascular Ultrasound/IVUS;  Surgeon: Belva Crome, MD;  Location: [place] [facility];   Service: Cardiovascular;  Laterality: N/A;  LAD  . CARDIAC CATHETERIZATION  06/22/2015   Procedure: Coronary/Graft Angiography;  Surgeon: Peter M Martinique, MD;  Location: [place] [facility];  Service: Cardiovascular;;  . CORONARY STENT INTERVENTION N/A 04/02/2016   Procedure: Coronary Stent Intervention;  Surgeon: Jettie Booze, MD;  Location: [place] [facility];  Service: Cardiovascular;  Laterality: N/A;  . ESOPHAGEAL MANOMETRY N/A 07/14/2016   Procedure: ESOPHAGEAL MANOMETRY (EM);  Surgeon: Mauri Pole, MD;  Location: [facility];  Service: Endoscopy;  Laterality: N/A;  . LEFT HEART CATH AND CORONARY ANGIOGRAPHY N/A 04/02/2016   Procedure: Left Heart Cath and Coronary Angiography;  Surgeon: Jettie Booze, MD;  Location: [place] [facility];  Service: Cardiovascular;  Laterality: N/A;  . LEFT HEART CATH  AND CORONARY ANGIOGRAPHY N/A 07/17/2016   Procedure: Left Heart Cath and Coronary Angiography;  Surgeon: Leonie Man, MD;  Location: [place] [facility];  Service: Cardiovascular;  Laterality: N/A;    Social History   Socioeconomic History  . Marital status: Single    Spouse name: Not on file  . Number of children: 4  . Years of education: 74  . Highest education level: Not on file  Social Needs  . Financial resource strain: Not on file  . Food insecurity - worry: Not on file  . Food insecurity - inability: Not on file  . Transportation needs - medical: Not on file  . Transportation needs - non-medical: Not on file  Occupational History  . Occupation: Forensic psychologist: [employer]  Tobacco Use  . Smoking status: Former Smoker    Packs/day: 0.50    Years: 23.00    Pack years: 11.50    Types: Cigarettes    Last attempt to quit: 04/02/2015    Years since quitting: 2.0  . Smokeless tobacco: Never Used  Substance and Sexual Activity  . Alcohol use: No    Alcohol/week: 0.0 oz  . Drug use: No  . Sexual activity: Not Currently    Partners: Male  Other Topics Concern    . Not on file  Social History Narrative   Lives in [place], works nights at [employer], [place]   Right-handed   Drinks 1 soda a day   Exercises some with walking   Lives with her 22 yo son   Likes her space, doesn't want a significant other   03/2016     Vitals:   04/28/17 1022  BP: 112/72  Pulse: 74  SpO2: 99%  Weight: 236 lb (107 kg)  Height: 5\' 9"  (1.753 m)    Wt Readings from Last 3 Encounters:  04/28/17 236 lb (107 kg)  04/16/17 239 lb (108.4 kg)  03/27/17 240 lb (108.9 kg)     PHYSICAL EXAM General: NAD HEENT: Normal. Neck: No JVD, no thyromegaly. Lungs: Clear to auscultation bilaterally with normal respiratory effort. CV: Regular rate and rhythm, normal S1/S2, no S3/S4, no murmur. No pretibial or periankle edema.  No carotid bruit.   Abdomen: Soft, nontender, no distention.  Neurologic: Alert and oriented.  Psych: Normal affect. Skin: Normal. Musculoskeletal: No gross deformities.    ECG: Most recent ECG reviewed.   Labs: Lab Results  Component Value Date/Time   K 4.2 02/06/2017 10:09 AM   BUN 8 02/06/2017 10:09 AM   CREATININE 0.87 02/06/2017 10:09 AM   CREATININE 0.72 11/22/2015 08:01 AM   ALT 23 03/27/2017 01:53 PM   TSH 0.475 03/27/2017 01:53 PM   HGB 12.8 08/15/2016 02:15 PM     Lipids: Lab Results  Component Value Date/Time   LDLCALC 77 03/27/2017 01:53 PM   CHOL 146 03/27/2017 01:53 PM   TRIG 85 03/27/2017 01:53 PM   HDL 52 03/27/2017 01:53 PM       ASSESSMENT AND PLAN:  1.  Coronary disease with 3 drug-eluting stents in the LAD: Symptomatically stable.  Currently on aspirin, Plavix, metoprolol, and Crestor.  I may consider stopping Plavix and starting Xarelto 2.5 mg twice daily at her next visit.  2.  Chronic hypertension: Blood pressure is normal.  No changes to therapy.  3.  Hyperlipidemia: Continue Crestor 20 mg.  4.  Chronic diastolic heart failure with complaints of orthopnea: She had grade 2 diastolic dysfunction in February  2018.  I will obtain a follow-up echocardiogram to assess for interval changes in cardiac function.  She has apparently tried Lasix in the past without success.  She is currently on hydrochlorthiazide 25 mg daily and appears euvolemic.  5.  Palpitations: Event monitoring was previously normal about a year ago.  Unclear if she is experiencing orthostatic changes.  Antihypertensive therapy was recently changed by her pulmonologist.  She is on amlodipine, metoprolol, losartan, and hydrochlorothiazide.  No further investigations at this time.   Disposition: Follow up 2 months.  Time spent: 40 minutes, of which greater than 50% was spent reviewing symptoms, relevant blood tests and studies, and discussing management plan with the patient.    Kate Sable, M.D., F.A.C.C.

## 2017-04-29 ENCOUNTER — Other Ambulatory Visit: Payer: Self-pay | Admitting: Cardiovascular Disease

## 2017-04-29 DIAGNOSIS — R0601 Orthopnea: Secondary | ICD-10-CM

## 2017-04-29 DIAGNOSIS — R0600 Dyspnea, unspecified: Secondary | ICD-10-CM

## 2017-04-29 DIAGNOSIS — R0609 Other forms of dyspnea: Secondary | ICD-10-CM

## 2017-04-29 DIAGNOSIS — I2510 Atherosclerotic heart disease of native coronary artery without angina pectoris: Secondary | ICD-10-CM

## 2017-05-08 NOTE — Research (Signed)
OPTIMIZE Informed Consent   Subject Name: Jennifer Cervantes  Subject met inclusion and exclusion criteria.  The informed consent form, study requirements and expectations were reviewed with the subject and questions and concerns were addressed prior to the signing of the consent form.  The subject verbalized understanding of the trail requirements.  The subject agreed to participate in the OPTIMIZE trial and signed the informed consent.  The informed consent was obtained prior to performance of any protocol-specific procedures for the subject.  A copy of the signed informed consent was given to the subject and a copy was placed in the subject's medical record.  [place]  07/17/2016, 10:10

## 2017-05-09 ENCOUNTER — Telehealth: Payer: Self-pay | Admitting: Medical

## 2017-05-09 NOTE — Telephone Encounter (Signed)
P.A. [place] 14 DAY SENSOR was completed and approved, pt informed

## 2017-05-14 ENCOUNTER — Ambulatory Visit (INDEPENDENT_AMBULATORY_CARE_PROVIDER_SITE_OTHER): Payer: 59

## 2017-05-14 ENCOUNTER — Other Ambulatory Visit: Payer: Self-pay

## 2017-05-14 DIAGNOSIS — I2510 Atherosclerotic heart disease of native coronary artery without angina pectoris: Secondary | ICD-10-CM

## 2017-05-14 DIAGNOSIS — R0601 Orthopnea: Secondary | ICD-10-CM

## 2017-05-14 DIAGNOSIS — R0609 Other forms of dyspnea: Secondary | ICD-10-CM

## 2017-05-14 DIAGNOSIS — R0600 Dyspnea, unspecified: Secondary | ICD-10-CM

## 2017-05-19 ENCOUNTER — Telehealth: Payer: Self-pay | Admitting: Cardiovascular Disease

## 2017-05-19 ENCOUNTER — Encounter: Payer: Self-pay | Admitting: Cardiovascular Disease

## 2017-05-19 DIAGNOSIS — I10 Essential (primary) hypertension: Secondary | ICD-10-CM

## 2017-05-19 NOTE — Telephone Encounter (Signed)
Echo was normal. Can try torsemide 20 mg daily with KCl 10 meq daily. Obtain BMET in 3 days.

## 2017-05-19 NOTE — Telephone Encounter (Signed)
Pt says for the last week legs/feet/ankles have been swollen - doesn't know if she has gained any weight - denies any worsening CP/SOB/dizziness since LOV - doesn't know HR/BP

## 2017-05-19 NOTE — Telephone Encounter (Signed)
Legs have been swollen since last week and she can hardly walk

## 2017-05-20 MED ORDER — POTASSIUM CHLORIDE ER 10 MEQ PO TBCR: 10.0000 meq | EXTENDED_RELEASE_TABLET | Freq: Every day | ORAL | 1 refills | Status: DC

## 2017-05-20 MED ORDER — TORSEMIDE 20 MG PO TABS: 20.0000 mg | ORAL_TABLET | Freq: Every day | ORAL | 1 refills | Status: DC

## 2017-05-20 MED FILL — POTASSIUM CL 10 MEQ TAB SA: 10 | 90 days supply | Qty: 90 | Fill #0

## 2017-05-20 MED FILL — TORSEMIDE 20 MG TABLET: 20 | 90 days supply | Qty: 90 | Fill #0

## 2017-05-20 NOTE — Telephone Encounter (Signed)
LM to return call.

## 2017-05-20 NOTE — Telephone Encounter (Signed)
Pt aware and voiced understanding - medication sent to [facility] outpatient pharmacy as requested - will mail lab orders.

## 2017-05-22 ENCOUNTER — Other Ambulatory Visit: Payer: Self-pay | Admitting: Neurology

## 2017-05-25 ENCOUNTER — Emergency Department (HOSPITAL_COMMUNITY): Payer: 59

## 2017-05-25 ENCOUNTER — Emergency Department (HOSPITAL_COMMUNITY)
Admission: EM | Admit: 2017-05-25 | Discharge: 2017-05-25 | Disposition: A | Discharge status: Home / Self Care | Payer: 59 | Attending: Emergency Medicine | Admitting: Emergency Medicine

## 2017-05-25 ENCOUNTER — Other Ambulatory Visit: Payer: Self-pay

## 2017-05-25 ENCOUNTER — Encounter (HOSPITAL_COMMUNITY): Payer: Self-pay | Admitting: *Deleted

## 2017-05-25 DIAGNOSIS — I5032 Chronic diastolic (congestive) heart failure: Secondary | ICD-10-CM | POA: Diagnosis not present

## 2017-05-25 DIAGNOSIS — Z7982 Long term (current) use of aspirin: Secondary | ICD-10-CM | POA: Insufficient documentation

## 2017-05-25 DIAGNOSIS — Z7902 Long term (current) use of antithrombotics/antiplatelets: Secondary | ICD-10-CM | POA: Diagnosis not present

## 2017-05-25 DIAGNOSIS — Z794 Long term (current) use of insulin: Secondary | ICD-10-CM | POA: Diagnosis not present

## 2017-05-25 DIAGNOSIS — I2510 Atherosclerotic heart disease of native coronary artery without angina pectoris: Secondary | ICD-10-CM | POA: Insufficient documentation

## 2017-05-25 DIAGNOSIS — R4182 Altered mental status, unspecified: Secondary | ICD-10-CM | POA: Diagnosis not present

## 2017-05-25 DIAGNOSIS — Z87891 Personal history of nicotine dependence: Secondary | ICD-10-CM | POA: Diagnosis not present

## 2017-05-25 DIAGNOSIS — E039 Hypothyroidism, unspecified: Secondary | ICD-10-CM | POA: Diagnosis not present

## 2017-05-25 DIAGNOSIS — R531 Weakness: Secondary | ICD-10-CM | POA: Diagnosis not present

## 2017-05-25 DIAGNOSIS — Z79899 Other long term (current) drug therapy: Secondary | ICD-10-CM | POA: Diagnosis not present

## 2017-05-25 DIAGNOSIS — I10 Essential (primary) hypertension: Secondary | ICD-10-CM | POA: Diagnosis not present

## 2017-05-25 DIAGNOSIS — F29 Unspecified psychosis not due to a substance or known physiological condition: Secondary | ICD-10-CM | POA: Diagnosis not present

## 2017-05-25 DIAGNOSIS — I110 Hypertensive heart disease with heart failure: Secondary | ICD-10-CM | POA: Diagnosis not present

## 2017-05-25 DIAGNOSIS — R400 Somnolence: Secondary | ICD-10-CM | POA: Diagnosis not present

## 2017-05-25 DIAGNOSIS — F23 Brief psychotic disorder: Secondary | ICD-10-CM | POA: Diagnosis not present

## 2017-05-25 LAB — ETHANOL: Alcohol, Ethyl (B): <10 mg/dL (ref <10)

## 2017-05-25 LAB — BASIC METABOLIC PANEL
ANION GAP: 18 — AB (ref 5–15)
BUN: 29 mg/dL — ABNORMAL HIGH (ref 6–20)
CALCIUM: 10.3 mg/dL (ref 8.9–10.3)
CO2: 21 mmol/L — ABNORMAL LOW (ref 22–32)
Chloride: 98 mmol/L — ABNORMAL LOW (ref 101–111)
Creatinine, Ser: 1.7 mg/dL — ABNORMAL HIGH (ref 0.44–1.00)
GFR, EST AFRICAN AMERICAN: 43 mL/min — AB (ref >60)
GFR, EST NON AFRICAN AMERICAN: 37 mL/min — AB (ref >60)
GLUCOSE: 148 mg/dL — AB (ref 65–99)
POTASSIUM: 3.9 mmol/L (ref 3.5–5.1)
SODIUM: 137 mmol/L (ref 135–145)

## 2017-05-25 LAB — CBC WITH DIFFERENTIAL/PLATELET
BASOS ABS: 0 10*3/uL (ref 0.0–0.1)
BASOS PCT: 0 %
EOS ABS: 0.2 10*3/uL (ref 0.0–0.7)
EOS PCT: 1 %
HCT: 49.6 % — ABNORMAL HIGH (ref 36.0–46.0)
Hemoglobin: 16.6 g/dL — ABNORMAL HIGH (ref 12.0–15.0)
LYMPHS PCT: 27 %
Lymphs Abs: 3.2 10*3/uL (ref 0.7–4.0)
MCH: 31.3 pg (ref 26.0–34.0)
MCHC: 33.5 g/dL (ref 30.0–36.0)
MCV: 93.4 fL (ref 78.0–100.0)
MONO ABS: 0.5 10*3/uL (ref 0.1–1.0)
Monocytes Relative: 4 %
Neutro Abs: 8 10*3/uL — ABNORMAL HIGH (ref 1.7–7.7)
Neutrophils Relative %: 68 %
PLATELETS: 287 10*3/uL (ref 150–400)
RBC: 5.31 MIL/uL — AB (ref 3.87–5.11)
RDW: 13.9 % (ref 11.5–15.5)
WBC: 11.9 10*3/uL — ABNORMAL HIGH (ref 4.0–10.5)

## 2017-05-25 LAB — D-DIMER, QUANTITATIVE (NOT AT ARMC): D DIMER QUANT: 0.48 ug{FEU}/mL (ref 0.00–0.50)

## 2017-05-25 LAB — TROPONIN I

## 2017-05-25 NOTE — Discharge Instructions (Addendum)
Your current diuretic has increased your creatinine.  Do not take torsemide tomorrow.  Then resume at one half dose.  We will up with your cardiologist within the next week to recheck your creatinine  Return to ER with any new or worsening symptoms

## 2017-05-25 NOTE — ED Triage Notes (Addendum)
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 Pt states that she feels tired and has pain in right lower leg, swelling in bilateral legs that worse over the past few days, was recently placed on lasix and another medication but she is not sure this past Friday. Pt states that she was recently in the hospital for 7 days due to heart issues.

## 2017-05-25 NOTE — ED Provider Notes (Signed)
[facility] EMERGENCY DEPARTMENT Provider Note   CSN: [id number] Arrival date & time: 05/25/17  1913     History   Chief Complaint Chief Complaint  Patient presents with  . Weakness    HPI Jennifer Cervantes is a 37 y.o. female.  Chief complaint is "I was hard to wake up"  HPI: 37 year old female.  She works third shift.  She last worked 36 hours ago.  After work she stayed up most of the day yesterday.  She did not go to sleep until last night.  Playing with her "grandbaby" yesterday afternoon before going to bed yesterday evening.  This afternoon apparently she was hard to wake up according to her son and another family member.  They brought her to the hospital.  She states she feels well but is "concerned because this is never happened before".  History of CHF.  History of N STEMI.  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  Has some pain in the right leg.  No leg swelling.  Is newly on diuretic.  Take.  No neck pain.  No fever.  No cough.  No acute abdominal complaints.  Past Medical History:  Diagnosis Date  . CAD (coronary artery disease)    a. LHC on 06/21/15 which showed 75% occl mid-dist LCx, 40% occl mRCA, 99% mid-dist LAD s/p DES and 50% occl mLAD s/p DES (overlapping stents) and significant LV dysfunction with anteroapical HK; b. 06/2015 Myoview: EF 55-65%, no ischemia/infarct.  . CHF (congestive heart failure) ([place])   . GERD (gastroesophageal reflux disease)   . Hyperlipidemia   . Hypertension   . Hypothyroidism   . Myocardial stunning (HCC)    a. EF 45% on 2D ECHO on 06/21/15 and severe LV dysfunction on LV gram by cath. Repeat 2D ECHO with EF 55-60% and mild HK of the apicoseptal myocardium.  [name] Neuropraxia of right median nerve    a. suspected after right radial cath. will follow with neuro outpatient if does not resolve.   . NSTEMI (non-ST elevated myocardial infarction) [facility]) 05/2015   [facility]  . Obesity   . 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 05/2015 Following PCI (radial access)-->conservatively managed.  . Tobacco abuse   . Tricuspid regurgitation   . Type II diabetes mellitus ([place])    a. Type II  . Vitamin D deficiency     Patient Active Problem List   Diagnosis Date Noted  . Essential hypertension 04/18/2017  . Fatigue 03/27/2017  . Folate deficiency 03/27/2017  . Daytime somnolence 03/27/2017  . Snoring 03/27/2017  . Right leg pain 03/27/2017  . Diabetes mellitus type 2 in obese ([place]) 02/12/2017  . Acute right ankle pain 02/11/2017  . Right sided weakness 10/13/2016  . Gait abnormality 10/13/2016  . Esophageal dysphagia   . Former smoker 07/15/2016  . Abnormal PFT 07/15/2016  . Vitamin D deficiency 07/15/2016  . Gastroesophageal reflux disease 07/15/2016  . Mood change 07/15/2016  . Paresthesia 07/15/2016  . Right arm pain 07/15/2016  . Encounter for health maintenance examination in adult 04/15/2016  . Chronic diastolic heart failure ([place]) 10/22/2015  . Deficiency anemia 10/05/2015  . Dyspnea on exertion 10/05/2015  . Hyperlipidemia   . Diabetes mellitus due to underlying condition with complication, without long-term current use of insulin ([place]) 06/29/2015  . Coronary artery disease involving native coronary artery with angina pectoris ([place])   . Neuropraxia of right median nerve   .  Obesity 06/21/2015  . Abnormal thyroid function test 06/21/2015  . History of non-ST elevation myocardial infarction (NSTEMI)     Past Surgical History:  Procedure Laterality Date  . [place] STUDY N/A 07/14/2016   Procedure: [place] STUDY;  Surgeon: Mauri Pole, MD;  Location: [facility] ENDOSCOPY;  Service: Endoscopy;  Laterality: N/A;  . CARDIAC CATHETERIZATION N/A 06/21/2015   Procedure: Left Heart Cath and Coronary Angiography;  Surgeon: Belva Crome, MD; pLAD 99%, CFX 75%, RCA 40%, EF 35%   . CARDIAC CATHETERIZATION N/A 06/21/2015   Procedure: Coronary Stent Intervention;   Surgeon: Belva Crome, MD;  Promus Premier DES, 3.0 x 20 and 3.5 x 32 mm stents to the LAD  . CARDIAC CATHETERIZATION N/A 06/21/2015   Procedure: Intravascular Ultrasound/IVUS;  Surgeon: Belva Crome, MD;  Location: [place] CV LAB;  Service: Cardiovascular;  Laterality: N/A;  LAD  . CARDIAC CATHETERIZATION  06/22/2015   Procedure: Coronary/Graft Angiography;  Surgeon: Peter M Martinique, MD;  Location: [place] CV LAB;  Service: Cardiovascular;;  . CORONARY STENT INTERVENTION N/A 04/02/2016   Procedure: Coronary Stent Intervention;  Surgeon: Jettie Booze, MD;  Location: [place] CV LAB;  Service: Cardiovascular;  Laterality: N/A;  . ESOPHAGEAL MANOMETRY N/A 07/14/2016   Procedure: ESOPHAGEAL MANOMETRY (EM);  Surgeon: Mauri Pole, MD;  Location: [facility] ENDOSCOPY;  Service: Endoscopy;  Laterality: N/A;  . LEFT HEART CATH AND CORONARY ANGIOGRAPHY N/A 04/02/2016   Procedure: Left Heart Cath and Coronary Angiography;  Surgeon: Jettie Booze, MD;  Location: [place] CV LAB;  Service: Cardiovascular;  Laterality: N/A;  . LEFT HEART CATH AND CORONARY ANGIOGRAPHY N/A 07/17/2016   Procedure: Left Heart Cath and Coronary Angiography;  Surgeon: Leonie Man, MD;  Location: [place] CV LAB;  Service: Cardiovascular;  Laterality: N/A;     OB History   None      Home Medications    Prior to Admission medications   Medication Sig Start Date End Date Taking? Authorizing Provider  acetaminophen (TYLENOL) 500 MG tablet Take 500 mg by mouth every 6 (six) hours as needed for headache (pain).   Yes [provider]  amLODipine (NORVASC) 5 MG tablet Take 1 tablet (5 mg total) by mouth daily. 02/06/17 02/01/18 Yes Belva Crome, MD  aspirin 81 MG EC tablet Take 1 tablet (81 mg total) by mouth daily. 04/16/16  Yes Tysinger, Camelia Eng, PA-C  Cholecalciferol (VITAMIN D) 2000 units tablet Take 1 tablet (2,000 Units total) by mouth daily. 07/15/16  Yes Tysinger, Camelia Eng, PA-C    clopidogrel (PLAVIX) 75 MG tablet Take 75 mg by mouth daily.   Yes [provider]  ferrous gluconate (FERGON) 324 MG tablet Take 1 tablet (324 mg total) by mouth daily with breakfast. 02/12/17  Yes Tysinger, Camelia Eng, PA-C  folic acid (FOLVITE) 1 MG tablet Take 1 tablet (1 mg total) by mouth daily. 02/12/17 02/12/18 Yes Tysinger, Camelia Eng, PA-C  Insulin Glargine (LANTUS SOLOSTAR) 100 UNIT/ML Solostar Pen Inject 20 Units into the skin daily at 10 pm. 02/12/17  Yes Tysinger, Camelia Eng, PA-C  linaclotide [facility]) 145 MCG CAPS capsule Take 1 capsule (145 mcg total) by mouth daily before breakfast. 09/03/16  Yes Armbruster, Carlota Raspberry, MD  losartan-hydrochlorothiazide (HYZAAR) 100-25 MG tablet Take 1 tablet by mouth daily. 04/18/17  Yes Tanda Rockers, MD  metFORMIN (GLUCOPHAGE) 1000 MG tablet Take 1 tablet (1,000 mg total) by mouth 2 (two) times daily with a  meal. 02/12/17  Yes Tysinger, Camelia Eng, PA-C  metoprolol tartrate (LOPRESSOR) 100 MG tablet Take 50 mg by mouth 2 (two) times daily.   Yes [provider]  nitroGLYCERIN (NITROSTAT) 0.4 MG SL tablet Place 1 tablet (0.4 mg total) under the tongue every 5 (five) minutes as needed for chest pain. 06/04/16  Yes Belva Crome, MD  oxyCODONE (OXY IR/ROXICODONE) 5 MG immediate release tablet Take 5 mg by mouth every 4 (four) hours as needed for breakthrough pain.   Yes [provider]  potassium chloride (K-DUR) 10 MEQ tablet Take 1 tablet (10 mEq total) by mouth daily. 05/20/17 08/18/17 Yes Herminio Commons, MD  pregabalin (LYRICA) 50 MG capsule Take one capsule TID.  Please call [phone] to schedule follow up or may request PCP to take over refills. Patient taking differently: Take 50 mg by mouth 3 (three) times daily.  05/25/17  Yes Marcial Pacas, MD  promethazine (PHENERGAN) 12.5 MG tablet Take 1 tablet (12.5 mg total) by mouth every 6 (six) hours as needed for nausea or vomiting. 08/06/16  Yes Tysinger, Camelia Eng, PA-C  rosuvastatin  (CRESTOR) 20 MG tablet Take 1 tablet (20 mg total) by mouth at bedtime. 02/12/17 02/12/18 Yes Tysinger, Camelia Eng, PA-C  torsemide (DEMADEX) 20 MG tablet Take 1 tablet (20 mg total) by mouth daily. 05/20/17 08/18/17 Yes Herminio Commons, MD  vitamin B-12 (CYANOCOBALAMIN) 1000 MCG tablet Take 1 tablet (1,000 mcg total) by mouth daily. 02/12/17  Yes Tysinger, Camelia Eng, PA-C  UNIFINE PENTIPS 32G X 4 MM MISC USE AS DIRECTED (USE WITH LANTUS PEN) 02/11/17   Tysinger, Camelia Eng, PA-C    Family History Family History  Problem Relation Age of Onset  . Diabetes Mother   . Healthy Father   . Healthy Sister   . Healthy Brother   . Diabetes Maternal Grandmother   . Heart disease Maternal Grandmother   . Diabetes Maternal Aunt        x 2  . Heart attack Neg Hx   . Heart failure Neg Hx   . Stroke Neg Hx   . Hyperlipidemia Neg Hx   . Hypertension Neg Hx   . Stomach cancer Neg Hx   . Colon cancer Neg Hx     Social History Social History   Tobacco Use  . Smoking status: Former Smoker    Packs/day: 0.50    Years: 23.00    Pack years: 11.50    Types: Cigarettes    Last attempt to quit: 04/02/2015    Years since quitting: 2.1  . Smokeless tobacco: Never Used  Substance Use Topics  . Alcohol use: No    Alcohol/week: 0.0 oz  . Drug use: No     Allergies   Patient has no known allergies.   Review of Systems Review of Systems  Constitutional: Negative for appetite change, chills, diaphoresis, fatigue and fever.  HENT: Negative for mouth sores, sore throat and trouble swallowing.   Eyes: Negative for visual disturbance.  Respiratory: Negative for cough, chest tightness, shortness of breath and wheezing.   Cardiovascular: Negative for chest pain.  Gastrointestinal: Negative for abdominal distention, abdominal pain, diarrhea, nausea and vomiting.  Endocrine: Negative for polydipsia, polyphagia and polyuria.  Genitourinary: Negative for dysuria, frequency and hematuria.  Musculoskeletal:  Negative for gait problem.  Skin: Negative for color change, pallor and rash.  Neurological: Negative for dizziness, syncope, light-headedness and headaches.  Hematological: Does not bruise/bleed easily.  Psychiatric/Behavioral: Positive for sleep disturbance. Negative  for behavioral problems and confusion.     Physical Exam Updated Vital Signs BP 108/83   Pulse 82   Temp 98.1 F (36.7 C) (Oral)   Resp 18   Ht 5\' 9"  (1.753 m)   Wt 107 kg (236 lb)   SpO2 100%   BMI 34.85 kg/m   Physical Exam  Constitutional: She is oriented to person, place, and time. She appears well-developed and well-nourished. No distress.  HENT:  Head: Normocephalic.  Eyes: Pupils are equal, round, and reactive to light. Conjunctivae are normal. No scleral icterus.  Neck: Normal range of motion. Neck supple. No thyromegaly present.  Cardiovascular: Normal rate and regular rhythm. Exam reveals no gallop and no friction rub.  No murmur heard. Pulmonary/Chest: Effort normal and breath sounds normal. No respiratory distress. She has no wheezes. She has no rales.  Abdominal: Soft. Bowel sounds are normal. She exhibits no distension. There is no tenderness. There is no rebound.  Musculoskeletal: Normal range of motion.  Neurological: She is alert and oriented to person, place, and time.  Normal cranial nerves.  Normal interaction.  Decreased right hand sensation but no change per patient.  Normal sensation and strength to the lower extremities.  No cording, swelling, or asymmetry.  Skin: Skin is warm and dry. No rash noted.  Psychiatric: She has a normal mood and affect. Her behavior is normal.     ED Treatments / Results  Labs (all labs ordered are listed, but only abnormal results are displayed) Labs Reviewed  CBC WITH DIFFERENTIAL/PLATELET - Abnormal; Notable for the following components:      Result Value   WBC 11.9 (*)    RBC 5.31 (*)    Hemoglobin 16.6 (*)    HCT 49.6 (*)    Neutro Abs 8.0 (*)     All other components within normal limits  BASIC METABOLIC PANEL - Abnormal; Notable for the following components:   Chloride 98 (*)    CO2 21 (*)    Glucose, Bld 148 (*)    BUN 29 (*)    Creatinine, Ser 1.70 (*)    GFR calc non Af Amer 37 (*)    GFR calc Af Amer 43 (*)    Anion gap 18 (*)    All other components within normal limits  TROPONIN I  ETHANOL  D-DIMER, QUANTITATIVE (NOT AT [facility])    EKG None  Radiology Ct Head Wo Contrast  Result Date: 05/25/2017 CLINICAL DATA:  Altered mental status, drowsiness, confusion EXAM: CT HEAD WITHOUT CONTRAST TECHNIQUE: Contiguous axial images were obtained from the base of the skull through the vertex without intravenous contrast. COMPARISON:  MRI brain dated 10/25/2016 FINDINGS: Brain: No evidence of acute infarction, hemorrhage, hydrocephalus, extra-axial collection or mass lesion/mass effect. Vascular: No hyperdense vessel or unexpected calcification. Skull: Normal. Negative for fracture or focal lesion. Sinuses/Orbits: The visualized paranasal sinuses are essentially clear. The mastoid air cells are unopacified. Other: None. IMPRESSION: Normal head CT. Electronically Signed   By: Julian Hy M.D.   On: 05/25/2017 21:59    Procedures Procedures (including critical care time)  Medications Ordered in ED Medications - No data to display   Initial Impression / Assessment and Plan / ED Course  I have reviewed the triage vital signs and the nursing notes.  Pertinent labs & imaging results that were available during my care of the patient were reviewed by me and considered in my medical decision making (see chart for details).   This may linearly  be sleep deprivation and somnolence.  CT normal.  Bump in her creatinine from her new diuretic use.  We will ask her to decrease her dosage and follow-up with her cardiologist.  Comfortable with her being discharged home.  Return with recurrence.    Final Clinical Impressions(s) / ED Diagnoses    Final diagnoses:  Somnolence    ED Discharge Orders    None       Tanna Furry, MD 05/25/17 2305

## 2017-05-27 ENCOUNTER — Telehealth: Payer: Self-pay | Admitting: *Deleted

## 2017-05-27 NOTE — Telephone Encounter (Signed)
Notes recorded by Laurine Blazer, LPN on 2/0/0379 at 4:44 PM EDT Patient notified. Copy to pmd. Follow up scheduled for 07/03/17 with Dr. Bronson Ing. ------  Notes recorded by Herminio Commons, MD on 05/18/2017 at 3:59 PM EDT Normal

## 2017-05-29 ENCOUNTER — Ambulatory Visit: Payer: 59 | Admitting: Internal Medicine

## 2017-06-11 ENCOUNTER — Encounter: Payer: Self-pay | Admitting: Internal Medicine

## 2017-06-11 ENCOUNTER — Ambulatory Visit (INDEPENDENT_AMBULATORY_CARE_PROVIDER_SITE_OTHER): Payer: 59 | Admitting: Internal Medicine

## 2017-06-11 VITALS — BP 120/82 | HR 85 | Ht 69.0 in | Wt 236.0 lb

## 2017-06-11 DIAGNOSIS — I10 Essential (primary) hypertension: Secondary | ICD-10-CM

## 2017-06-11 DIAGNOSIS — R0609 Other forms of dyspnea: Secondary | ICD-10-CM

## 2017-06-11 DIAGNOSIS — R0600 Dyspnea, unspecified: Secondary | ICD-10-CM

## 2017-06-11 LAB — NITRIC OXIDE: Nitric Oxide: 27

## 2017-06-11 NOTE — Patient Instructions (Addendum)
Only use your albuterol as a rescue medication to be used if you can't catch your breath by resting or doing a relaxed purse lip breathing pattern.  - The less you use it, the better it will work when you need it. - Ok to use up to 2 puffs  every 4 hours if you must but call for immediate appointment if use goes up over your usual need - Don't leave home without it !!  (think of it like the spare tire for your car)    Please see patient coordinator before you leave today  to schedule Methacholine challenge testing and I will call you with results   Please schedule a follow up office visit in 6 weeks, call sooner if needed with all medications /inhalers/ solutions in hand so we can verify exactly what you are taking. This includes all medications from all doctors and over the counters

## 2017-06-11 NOTE — Progress Notes (Signed)
Subjective:     Patient ID: Jennifer Cervantes, female   DOB: 1980-06-28,   MRN: [id number]    Brief patient profile:  57 yobf quit smoking 2017 and healthy most of her life x issues with wt and at  last IUP 2004 after lost all the excess baby wt still weighed around 330 then dx dm 2011 but still enjoyed good activity tol then bad sweats /vomiting 06/21/2015 > ER > dx IHD>> Stents done  and then says w/in an hour or two of cath developed cp ?>  Repeat cath next day = 06/22/15 no lesions  > sweating and vomiting completey resolved but cp continued and has persisted daily ever since then so referred to pulmonary clinic 04/16/2017 by Dr   Glade Lloyd   W/u by Havery Moros for chronic dysphagia (while on ACEi) with final note 09/03/16 Functional heartburn / Dyspepsia / Dysphagia - symptoms persist on protonix 40mg  BID. H pylori negative. Barium swallow without any pathology. Esophageal manometry normal. PH testing shows normal Demeester score, no significant nonacid reflux. We discussed that with her persistent symptoms, EGD is indicated, but we had hoped to avoid that given her recent cardiac history. I offered her a diagnostic EGD while on antiplatelet therapy if cardiology cleared her, but she declined - main thing to rule out would be EoE which could cause this. Otherwise, I suspect she more than likely has functional heartburn / dysphagia given her workup to date. I offered her a trial of TCA to help treat this (desipramine) and discussed risks / benefits. Following a discussion of this, she declined, she doesn't want to add any further medications to her regimen at this time. Up to her if she wishes to continue PPI at this time, if she feels worse stopping it, she can continue it as needed.      History of Present Illness  04/16/2017 1st [place] [facility] office visit/ Jennifer Cervantes   Chief Complaint  Patient presents with  . Pulmonary Consult    Referred by Chana Bode, PA. Pt c/o SOB off and on since had  cardiac stent placed in 2017. She states she occ gets SOB at rest but mainly with walking a long distance. She has CP every that she states is constant and both sharp and dull.   sob and cp have persisted every day since the stents placed which I think correlates exactly with rx with ACEi Cp is diffuse / ant L > R does not radiate / sometimes with nausea - note she really hasn't focused on this during her cards eval the latest of which 02/06/17 states "Cardiac wise she has some exertional dyspnea.  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 Off acei x 2 months/ did not bring meds but checked them all off our list and added ventolin to it and no gerd rx listed  Chief Complaint  Patient presents with  . Follow-up  Breathing has improved slightly. Her CP is unchanged. No new co's. She is using her ventolin 6 x per day on average.   Dyspnea:  No change:  Sometimes at rest/ every time walk nl speed gives out due to sob  Cough: not now/ no  longer dyphagia Sleep: choking immediately if lies flat / on 5 pillows does fine  SABA use: using  6 x daily even though it doesn't correct any of her symptoms convincingly and if anything "only works in the first min or two then wears off"  Cp same pattern x 2 y no change since last ov   No obvious day to day or daytime variability or assoc excess/ purulent sputum or mucus plugs or hemoptysis or chest tightness, subjective wheeze or overt sinus or hb symptoms. No unusual exposure hx or h/o  childhood pna/ asthma or knowledge of premature birth.  Sleeping  "Ok on 5 pillows"  without nocturnal  or early am exacerbation  of respiratory  c/o's or need for noct saba. Also denies any obvious fluctuation of symptoms with weather or environmental changes or other aggravating or alleviating factors except as outlined above   Current Allergies, Complete Past Medical History, Past Surgical History, Family History, and Social History were reviewed in [employer] record.  ROS  The following are not active complaints unless bolded Hoarseness, sore throat, dysphagia, dental problems, itching, sneezing,  nasal congestion or discharge of excess mucus or purulent secretions, ear ache,   fever, chills, sweats, unintended wt loss or wt gain, classically pleuritic or exertional cp,  orthopnea pnd or arm/hand swelling  or leg swelling, presyncope, palpitations, abdominal pain, anorexia, nausea, vomiting, diarrhea  or change in bowel habits or change in bladder habits, change in stools or change in urine, dysuria, hematuria,  rash, arthralgias, visual complaints, headache, numbness, weakness or ataxia or problems with walking or coordination,  change in mood or  memory.        Current Meds  Medication Sig  . acetaminophen (TYLENOL) 500 MG tablet Take 500 mg by mouth every 6 (six) hours as needed for headache (pain).  [name] albuterol (PROVENTIL HFA) 108 (90 Base) MCG/ACT inhaler Inhale 2 puffs into the lungs every 4 (four) hours as needed.  [name] amLODipine (NORVASC) 5 MG tablet Take 1 tablet (5 mg total) by mouth daily.  [name] aspirin 81 MG EC tablet Take 1 tablet (81 mg total) by mouth daily.  . Cholecalciferol (VITAMIN D) 2000 units tablet Take 1 tablet (2,000 Units total) by mouth daily.  . clopidogrel (PLAVIX) 75 MG tablet Take 75 mg by mouth daily.  . ferrous gluconate (FERGON) 324 MG tablet Take 1 tablet (324 mg total) by mouth daily with breakfast.  . folic acid (FOLVITE) 1 MG tablet Take  1 tablet (1 mg total) by mouth daily.  . Insulin Glargine (LANTUS SOLOSTAR) 100 UNIT/ML Solostar Pen Inject 20 Units into the skin daily at 10 pm.  . linaclotide (LINZESS) 145 MCG CAPS capsule Take 1 capsule (145 mcg total) by mouth daily before breakfast.  . losartan-hydrochlorothiazide (HYZAAR) 100-25 MG tablet Take 1 tablet by mouth daily.  . metFORMIN (GLUCOPHAGE) 1000 MG tablet Take 1 tablet (1,000 mg total) by mouth 2 (two) times daily with a meal.  . metoprolol tartrate (LOPRESSOR) 100 MG tablet Take 50 mg by mouth 2 (two) times daily.  . nitroGLYCERIN (NITROSTAT) 0.4 MG SL tablet Place 1 tablet (0.4 mg total) under the tongue every 5 (five) minutes as needed  for chest pain.  [name] oxyCODONE (OXY IR/ROXICODONE) 5 MG immediate release tablet Take 5 mg by mouth every 4 (four) hours as needed for breakthrough pain.  . potassium chloride (K-DUR) 10 MEQ tablet Take 1 tablet (10 mEq total) by mouth daily.  . pregabalin (LYRICA) 50 MG capsule Take one capsule TID.  Please call [phone] to schedule follow up or may request PCP to take over refills. (Patient taking differently: Take 50 mg by mouth 3 (three) times daily. )  . promethazine (PHENERGAN) 12.5 MG tablet Take 1 tablet (12.5 mg total) by mouth every 6 (six) hours as needed for nausea or vomiting.  . rosuvastatin (CRESTOR) 20 MG tablet Take 1 tablet (20 mg total) by mouth at bedtime.  . torsemide (DEMADEX) 20 MG tablet Take 1 tablet (20 mg total) by mouth daily.  [name] UNIFINE PENTIPS 32G X 4 MM MISC USE AS DIRECTED (USE WITH LANTUS PEN)  . vitamin B-12 (CYANOCOBALAMIN) 1000 MCG tablet Take 1 tablet (1,000 mcg total) by mouth daily.                 Objective:   Physical Exam   Still very obvious belle affect   06/11/2017       236   04/16/17 239 lb (108.4 kg)  03/27/17 240 lb (108.9 kg)  02/11/17 233 lb 6.4 oz (105.9 kg)    Vital signs reviewed - Note on arrival 02 sats  99% on RA  And BP 120/82       HEENT: nl dentition, turbinates  bilaterally, and oropharynx. Nl external ear canals without cough reflex   NECK :  without JVD/Nodes/TM/ nl carotid upstrokes bilaterally   LUNGS: no acc muscle use,  Nl contour chest which is clear to A and P bilaterally without cough on insp or exp maneuvers   CV:  RRR  no s3 or murmur or increase in P2, and no edema   ABD:  soft and nontender with nl inspiratory excursion in the supine position. No bruits or organomegaly appreciated, bowel sounds nl  MS:  Nl gait/ ext warm without deformities, calf tenderness, cyanosis or clubbing No obvious joint restrictions   SKIN: warm and dry without lesions    NEURO:  alert, approp, nl sensorium with  no motor or cerebellar deficits apparent.             Assessment:

## 2017-06-13 ENCOUNTER — Encounter: Payer: Self-pay | Admitting: Internal Medicine

## 2017-06-13 NOTE — Assessment & Plan Note (Addendum)
04/16/2017  Walked RA x 3 laps @ 185 ft each stopped due to  End of study, mild doe at fast pace, no desat, some sensation of chest tightness - Spirometry 04/16/2017  Nl with only abn = early (effort dep) portion of f/v loop  - trial off acei 04/16/2017 > 06/11/2017  dysphagia resolved but not cp or sob or choking at hs so rec continue max gerd rx and schedule MCT    Very strongly doubt asthma here and note now taking albuterol up to 6 x daily despite nl exam strongly suggesting anxiety/panic disorder here > best way to sort out is MCT next.  I had an extended discussion with the patient reviewing all relevant studies completed to date and  lasting 15 to 20 minutes of a 25 minute visit    Each maintenance medication was reviewed in detail including most importantly the difference between maintenance and prns and under what circumstances the prns are to be triggered using an action plan format that is not reflected in the computer generated alphabetically organized AVS.    Please see AVS for specific instructions unique to this visit that I personally wrote and verbalized to the the pt in detail and then reviewed with pt  by my nurse highlighting any  changes in therapy recommended at today's visit to their plan of care.

## 2017-06-13 NOTE — Assessment & Plan Note (Addendum)
Trial off acei 04/16/2017 due to atypical cp/ sob>  Dysphagia resolved  06/11/2017 and no longer on gerd rx (if MAR correct)    Although even in retrospect it may not be clear the ACEi contributed to the pt's symptoms,  Pt improved off them (at least the chronicsense of  dysphagia did)  and adding them back at this point or in the future would risk confusion in interpretation of non-specific respiratory symptoms to which this patient is prone  ie  Better not to muddy the waters here. Since Adequate control on present rx, reviewed in detail with pt > no change in rx needed    Return with all meds in hand using a trust but verify approach to confirm accurate Medication  Reconciliation The principal here is that until we are certain that the  patients are doing what we've asked, it makes no sense to ask them to do more.

## 2017-06-18 ENCOUNTER — Ambulatory Visit (HOSPITAL_COMMUNITY)
Admission: RE | Admit: 2017-06-18 | Discharge: 2017-06-18 | Disposition: A | Discharge status: Home / Self Care | Payer: 59 | Source: Ambulatory Visit | Attending: Internal Medicine | Admitting: Internal Medicine

## 2017-06-18 DIAGNOSIS — J449 Chronic obstructive pulmonary disease, unspecified: Secondary | ICD-10-CM | POA: Insufficient documentation

## 2017-06-18 DIAGNOSIS — R0609 Other forms of dyspnea: Secondary | ICD-10-CM | POA: Diagnosis not present

## 2017-06-18 DIAGNOSIS — Z87891 Personal history of nicotine dependence: Secondary | ICD-10-CM | POA: Diagnosis not present

## 2017-06-18 DIAGNOSIS — R0600 Dyspnea, unspecified: Secondary | ICD-10-CM

## 2017-06-18 LAB — PULMONARY FUNCTION TEST
FEF 25-75 Post: 2.66 L/s
FEF 25-75 Pre: 2.67 L/s
FEF2575-%Change-Post: 0 %
FEF2575-%Pred-Post: 81 %
FEF2575-%Pred-Pre: 82 %
FEV1-%Change-Post: 0 %
FEV1-%Pred-Post: 92 %
FEV1-%Pred-Pre: 92 %
FEV1-Post: 2.81 L
FEV1-Pre: 2.81 L
FEV1FVC-%Change-Post: 3 %
FEV1FVC-%Pred-Pre: 94 %
FEV6-%Change-Post: -4 %
FEV6-%Pred-Post: 93 %
FEV6-%Pred-Pre: 97 %
FEV6-Post: 3.39 L
FEV6-Pre: 3.54 L
FEV6FVC-%Change-Post: 0 %
FEV6FVC-%Pred-Post: 101 %
FEV6FVC-%Pred-Pre: 102 %
FVC-%Change-Post: -3 %
FVC-%Pred-Post: 92 %
FVC-%Pred-Pre: 95 %
FVC-Post: 3.4 L
FVC-Pre: 3.54 L
Post FEV1/FVC ratio: 83 %
Post FEV6/FVC ratio: 100 %
Pre FEV1/FVC ratio: 79 %
Pre FEV6/FVC Ratio: 100 %

## 2017-06-18 MED ORDER — METHACHOLINE 0.0625 MG/ML NEB SOLN
2.0000 mL | Freq: Once | RESPIRATORY_TRACT | Status: AC
  Administered 2017-06-18: 0.125 mg via RESPIRATORY_TRACT
  Filled 2017-06-18: qty 2

## 2017-06-18 MED ORDER — METHACHOLINE 0.25 MG/ML NEB SOLN
2.0000 mL | Freq: Once | RESPIRATORY_TRACT | Status: AC
  Administered 2017-06-18: 0.5 mg via RESPIRATORY_TRACT
  Filled 2017-06-18: qty 2

## 2017-06-18 MED ORDER — SODIUM CHLORIDE 0.9 % IN NEBU
3.0000 mL | INHALATION_SOLUTION | Freq: Once | RESPIRATORY_TRACT | Status: AC
  Administered 2017-06-18: 3 mL via RESPIRATORY_TRACT
  Filled 2017-06-18: qty 3

## 2017-06-18 MED ORDER — METHACHOLINE 4 MG/ML NEB SOLN
2.0000 mL | Freq: Once | RESPIRATORY_TRACT | Status: AC
  Administered 2017-06-18: 8 mg via RESPIRATORY_TRACT
  Filled 2017-06-18: qty 2

## 2017-06-18 MED ORDER — METHACHOLINE 1 MG/ML NEB SOLN
2.0000 mL | Freq: Once | RESPIRATORY_TRACT | Status: AC
  Administered 2017-06-18: 2 mg via RESPIRATORY_TRACT
  Filled 2017-06-18: qty 2

## 2017-06-18 MED ORDER — METHACHOLINE 16 MG/ML NEB SOLN
2.0000 mL | Freq: Once | RESPIRATORY_TRACT | Status: AC
  Administered 2017-06-18: 32 mg via RESPIRATORY_TRACT
  Filled 2017-06-18: qty 2

## 2017-06-18 MED ORDER — ALBUTEROL SULFATE (2.5 MG/3ML) 0.083% IN NEBU
2.5000 mg | INHALATION_SOLUTION | Freq: Once | RESPIRATORY_TRACT | Status: AC
  Administered 2017-06-18: 2.5 mg via RESPIRATORY_TRACT

## 2017-06-19 NOTE — Progress Notes (Signed)
LMTCB

## 2017-06-22 ENCOUNTER — Telehealth: Payer: Self-pay | Admitting: Internal Medicine

## 2017-06-22 NOTE — Telephone Encounter (Signed)
Called patient, unable to reach. Left message to give us a call back.  

## 2017-06-23 NOTE — Telephone Encounter (Signed)
Pt returning call. Cb [phone].

## 2017-06-23 NOTE — Telephone Encounter (Signed)
ATC pt, no answer. Left message for pt to call back.    Notes recorded by Tanda Rockers, MD on 06/19/2017 at 5:12 AM EDT Call patient : Study is neg for asthma > no change rx for now Be sure patient has f/u ov so we can go over all the details of this study and get a plan together moving forward - ok to move up f/u if not feeling better and wants to be seen sooner

## 2017-06-23 NOTE — Telephone Encounter (Signed)
Called and spoke with patient, she is aware and verbalized understanding. Nothing further needed.  

## 2017-07-03 ENCOUNTER — Encounter: Payer: Self-pay | Admitting: Cardiovascular Disease

## 2017-07-03 ENCOUNTER — Ambulatory Visit (INDEPENDENT_AMBULATORY_CARE_PROVIDER_SITE_OTHER): Payer: 59 | Admitting: Cardiovascular Disease

## 2017-07-03 VITALS — BP 122/72 | HR 76 | Ht 69.0 in | Wt 241.0 lb

## 2017-07-03 DIAGNOSIS — I10 Essential (primary) hypertension: Secondary | ICD-10-CM

## 2017-07-03 DIAGNOSIS — E785 Hyperlipidemia, unspecified: Secondary | ICD-10-CM | POA: Diagnosis not present

## 2017-07-03 DIAGNOSIS — N179 Acute kidney failure, unspecified: Secondary | ICD-10-CM

## 2017-07-03 DIAGNOSIS — I5032 Chronic diastolic (congestive) heart failure: Secondary | ICD-10-CM | POA: Diagnosis not present

## 2017-07-03 DIAGNOSIS — I25118 Atherosclerotic heart disease of native coronary artery with other forms of angina pectoris: Secondary | ICD-10-CM | POA: Diagnosis not present

## 2017-07-03 DIAGNOSIS — R002 Palpitations: Secondary | ICD-10-CM | POA: Diagnosis not present

## 2017-07-03 MED ORDER — RIVAROXABAN 2.5 MG PO TABS: 2.5000 mg | ORAL_TABLET | Freq: Two times a day (BID) | ORAL | 6 refills | Status: DC

## 2017-07-03 MED FILL — XARELTO 2.5 MG TABS: 2.5 | 30 days supply | Qty: 60 | Fill #0

## 2017-07-03 NOTE — Progress Notes (Signed)
SUBJECTIVE: Patient presents for routine follow-up of coronary artery disease.  Echocardiogram 05/14/2017 demonstrated normal left ventricular systolic and diastolic function with normal regional wall motion, LVEF 60 to 65%.  She did not respond to Lasix in the past so I prescribed torsemide.  BUN 29, creatinine 1.7 on 05/25/2017.  She has chronic left upper sided chest pains which are not associated with exertion.  She has a varicose vein she pointed out to me just below the right knee.  She complains of feet swelling.  She has questions about intercourse that she is scared.    Review of Systems: As per "subjective", otherwise negative.  No Known Allergies  Current Outpatient Medications  Medication Sig Dispense Refill  . acetaminophen (TYLENOL) 500 MG tablet Take 500 mg by mouth every 6 (six) hours as needed for headache (pain).    [name] albuterol (PROVENTIL HFA) 108 (90 Base) MCG/ACT inhaler Inhale 2 puffs into the lungs every 4 (four) hours as needed.    [name] aspirin 81 MG EC tablet Take 1 tablet (81 mg total) by mouth daily. 90 tablet 3  . Cholecalciferol (VITAMIN D) 2000 units tablet Take 1 tablet (2,000 Units total) by mouth daily. 90 tablet 3  . clopidogrel (PLAVIX) 75 MG tablet Take 75 mg by mouth daily.    . ferrous gluconate (FERGON) 324 MG tablet Take 1 tablet (324 mg total) by mouth daily with breakfast. 90 tablet 1  . folic acid (FOLVITE) 1 MG tablet Take 1 tablet (1 mg total) by mouth daily. 90 tablet 1  . Insulin Glargine (LANTUS SOLOSTAR) 100 UNIT/ML Solostar Pen Inject 20 Units into the skin daily at 10 pm. 5 pen 5  . linaclotide (LINZESS) 145 MCG CAPS capsule Take 1 capsule (145 mcg total) by mouth daily before breakfast. 90 capsule 1  . losartan-hydrochlorothiazide (HYZAAR) 100-25 MG tablet Take 1 tablet by mouth daily. 30 tablet 11  . metFORMIN (GLUCOPHAGE) 1000 MG tablet Take 1 tablet (1,000 mg total) by mouth 2 (two) times daily with a meal. 180 tablet 3  .  metoprolol tartrate (LOPRESSOR) 100 MG tablet Take 50 mg by mouth 2 (two) times daily.    . nitroGLYCERIN (NITROSTAT) 0.4 MG SL tablet Place 1 tablet (0.4 mg total) under the tongue every 5 (five) minutes as needed for chest pain. 25 tablet 3  . oxyCODONE (OXY IR/ROXICODONE) 5 MG immediate release tablet Take 5 mg by mouth every 4 (four) hours as needed for breakthrough pain.    . potassium chloride (K-DUR) 10 MEQ tablet Take 1 tablet (10 mEq total) by mouth daily. 90 tablet 1  . pregabalin (LYRICA) 50 MG capsule Take one capsule TID.  Please call [phone] to schedule follow up or may request PCP to take over refills. (Patient taking differently: Take 50 mg by mouth 3 (three) times daily. ) 90 capsule 5  . promethazine (PHENERGAN) 12.5 MG tablet Take 1 tablet (12.5 mg total) by mouth every 6 (six) hours as needed for nausea or vomiting. 15 tablet 0  . rosuvastatin (CRESTOR) 20 MG tablet Take 1 tablet (20 mg total) by mouth at bedtime. 90 tablet 3  . torsemide (DEMADEX) 20 MG tablet Take 1 tablet (20 mg total) by mouth daily. 90 tablet 1  . UNIFINE PENTIPS 32G X 4 MM MISC USE AS DIRECTED (USE WITH LANTUS PEN) 100 each 5  . vitamin B-12 (CYANOCOBALAMIN) 1000 MCG tablet Take 1 tablet (1,000 mcg total) by mouth daily. 90 tablet 1  No current facility-administered medications for this visit.     Past Medical History:  Diagnosis Date  . CAD (coronary artery disease)    a. LHC on 06/21/15 which showed 75% occl mid-dist LCx, 40% occl mRCA, 99% mid-dist LAD s/p DES and 50% occl mLAD s/p DES (overlapping stents) and significant LV dysfunction with anteroapical HK; b. 06/2015 Myoview: EF 55-65%, no ischemia/infarct.  . CHF (congestive heart failure) ([employer])   . GERD (gastroesophageal reflux disease)   . Hyperlipidemia   . Hypertension   . Hypothyroidism   . Myocardial stunning (HCC)    a. EF 45% on 2D ECHO on 06/21/15 and severe LV dysfunction on LV gram by cath. Repeat 2D ECHO with EF 55-60% and mild HK of  the apicoseptal myocardium.  [name] Neuropraxia of right median nerve    a. suspected after right radial cath. will follow with neuro outpatient if does not resolve.   . NSTEMI (non-ST elevated myocardial infarction) [facility]) 05/2015   [facility]  . Obesity   . 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 05/2015 Following PCI (radial access)-->conservatively managed.  . Tobacco abuse   . Tricuspid regurgitation   . Type II diabetes mellitus ([place])    a. Type II  . Vitamin D deficiency     Past Surgical History:  Procedure Laterality Date  . [place] STUDY N/A 07/14/2016   Procedure: [place] STUDY;  Surgeon: Mauri Pole, MD;  Location: [facility];  Service: Endoscopy;  Laterality: N/A;  . CARDIAC CATHETERIZATION N/A 06/21/2015   Procedure: Left Heart Cath and Coronary Angiography;  Surgeon: Belva Crome, MD; pLAD 99%, CFX 75%, RCA 40%, EF 35%   . CARDIAC CATHETERIZATION N/A 06/21/2015   Procedure: Coronary Stent Intervention;  Surgeon: Belva Crome, MD;  Promus Premier DES, 3.0 x 20 and 3.5 x 32 mm stents to the LAD  . CARDIAC CATHETERIZATION N/A 06/21/2015   Procedure: Intravascular Ultrasound/IVUS;  Surgeon: Belva Crome, MD;  Location: [place] [facility] LAB;  Service: Cardiovascular;  Laterality: N/A;  LAD  . CARDIAC CATHETERIZATION  06/22/2015   Procedure: Coronary/Graft Angiography;  Surgeon: Peter M Martinique, MD;  Location: [place] CV LAB;  Service: Cardiovascular;;  . CORONARY STENT INTERVENTION N/A 04/02/2016   Procedure: Coronary Stent Intervention;  Surgeon: Jettie Booze, MD;  Location: [place] CV LAB;  Service: Cardiovascular;  Laterality: N/A;  . ESOPHAGEAL MANOMETRY N/A 07/14/2016   Procedure: ESOPHAGEAL MANOMETRY (EM);  Surgeon: Mauri Pole, MD;  Location: [facility];  Service: Endoscopy;  Laterality: N/A;  . LEFT HEART CATH AND CORONARY ANGIOGRAPHY N/A 04/02/2016   Procedure: Left Heart Cath and Coronary Angiography;  Surgeon: Jettie Booze, MD;   Location: [place] CV LAB;  Service: Cardiovascular;  Laterality: N/A;  . LEFT HEART CATH AND CORONARY ANGIOGRAPHY N/A 07/17/2016   Procedure: Left Heart Cath and Coronary Angiography;  Surgeon: Leonie Man, MD;  Location: [place] CV LAB;  Service: Cardiovascular;  Laterality: N/A;    Social History   Socioeconomic History  . Marital status: Single    Spouse name: Not on file  . Number of children: 4  . Years of education: 80  . Highest education level: Not on file  Occupational History  . Occupation: Forensic psychologist: [employer]  . Financial resource strain: Not on file  . Food insecurity:    Worry: Not on file    Inability: Not on  file  . Transportation needs:    Medical: Not on file    Non-medical: Not on file  Tobacco Use  . Smoking status: Former Smoker    Packs/day: 0.50    Years: 23.00    Pack years: 11.50    Types: Cigarettes    Last attempt to quit: 04/02/2015    Years since quitting: 2.2  . Smokeless tobacco: Never Used  Substance and Sexual Activity  . Alcohol use: No    Alcohol/week: 0.0 oz  . Drug use: No  . Sexual activity: Not Currently    Partners: Male  Lifestyle  . Physical activity:    Days per week: Not on file    Minutes per session: Not on file  . Stress: Not on file  Relationships  . Social connections:    Talks on phone: Not on file    Gets together: Not on file    Attends religious service: Not on file    Active member of club or organization: Not on file    Attends meetings of clubs or organizations: Not on file    Relationship status: Not on file  . Intimate partner violence:    Fear of current or ex partner: Not on file    Emotionally abused: Not on file    Physically abused: Not on file    Forced sexual activity: Not on file  Other Topics Concern  . Not on file  Social History Narrative   Lives in [place], works nights at [employer], [place]   Right-handed   Drinks 1 soda a day   Exercises some with walking    Lives with her 46 yo son   Likes her space, doesn't want a significant other   03/2016     Vitals:   07/03/17 1450  BP: 122/72  Pulse: 76  SpO2: 97%  Weight: 241 lb (109.3 kg)  Height: 5\' 9"  (1.753 m)    Wt Readings from Last 3 Encounters:  07/03/17 241 lb (109.3 kg)  06/11/17 236 lb (107 kg)  05/25/17 236 lb (107 kg)     PHYSICAL EXAM General: NAD HEENT: Normal. Neck: No JVD, no thyromegaly. Lungs: Clear to auscultation bilaterally with normal respiratory effort. CV: Regular rate and rhythm, normal S1/S2, no S3/S4, no murmur. No pretibial or periankle edema.    Abdomen: Soft, nontender, no distention.  Neurologic: Alert and oriented.  Psych: Normal affect. Skin: Normal. Musculoskeletal: No gross deformities.    ECG: Most recent ECG reviewed.   Labs: Lab Results  Component Value Date/Time   K 3.9 05/25/2017 09:23 PM   BUN 29 (H) 05/25/2017 09:23 PM   BUN 8 02/06/2017 10:09 AM   CREATININE 1.70 (H) 05/25/2017 09:23 PM   CREATININE 0.72 11/22/2015 08:01 AM   ALT 23 03/27/2017 01:53 PM   TSH 0.475 03/27/2017 01:53 PM   HGB 16.6 (H) 05/25/2017 09:23 PM   HGB 12.8 08/15/2016 02:15 PM     Lipids: Lab Results  Component Value Date/Time   LDLCALC 77 03/27/2017 01:53 PM   LDLCALC 134 (H) 02/11/2017 08:54 AM   CHOL 146 03/27/2017 01:53 PM   TRIG 85 03/27/2017 01:53 PM   HDL 52 03/27/2017 01:53 PM       ASSESSMENT AND PLAN:  1.  Coronary disease with 3 drug-eluting stents in the LAD: Symptomatically stable.    Current chest pains are not consistent with ischemic pains.  Currently on aspirin, Plavix, metoprolol, and Crestor.  I will stop Plavix and start  Xarelto 2.5 mg twice daily.  2.  Chronic hypertension: Blood pressure is normal.  No changes to therapy.  3.  Hyperlipidemia: Continue Crestor 20 mg.  4.  Chronic diastolic heart failure with complaints of orthopnea: She had grade 2 diastolic dysfunction in February 3149 but diastolic function was normal  by most recent echocardiogram in March 2019. She has apparently tried Lasix in the past without success.  I have started torsemide.   BUN and creatinine were elevated as noted above on 05/25/2017.  I will check a basic metabolic panel.  She is also currently on hydrochlorthiazide 25 mg daily and appears euvolemic.  5.  Palpitations: Medically stable.  Event monitoring was previously normal.  No further investigations at this time.  6. Acute renal failure: BUN and creatinine were elevated as noted above on 05/25/2017.  I will check a basic metabolic panel. May need to stop torsemide altogether.   Disposition: Follow up 6 months   Kate Sable, M.D., F.A.C.C.

## 2017-07-03 NOTE — Patient Instructions (Signed)
Medication Instructions:  Your physician has recommended you make the following change in your medication:   STOP Plavix  START Xarelto 2.5 mg twice daily  Please continue all other medications as prescribed  Labwork: BMET Orders given today   Testing/Procedures: NONE  Follow-Up: Your physician wants you to follow-up in: [place] DR. Bronson Ing You will receive a reminder letter in the mail two months in advance. If you don't receive a letter, please call our office to schedule the follow-up appointment.  Any Other Special Instructions Will Be Listed Below (If Applicable).  If you need a refill on your cardiac medications before your next appointment, please call your pharmacy.

## 2017-07-07 DIAGNOSIS — I5032 Chronic diastolic (congestive) heart failure: Secondary | ICD-10-CM | POA: Diagnosis not present

## 2017-07-08 ENCOUNTER — Telehealth: Payer: Self-pay | Admitting: *Deleted

## 2017-07-08 NOTE — Telephone Encounter (Signed)
Pt not home. Left msg to call when home.

## 2017-07-08 NOTE — Telephone Encounter (Signed)
-----   Message from Herminio Commons, MD sent at 07/08/2017  9:56 AM EDT ----- good

## 2017-07-24 ENCOUNTER — Ambulatory Visit (INDEPENDENT_AMBULATORY_CARE_PROVIDER_SITE_OTHER): Payer: 59 | Admitting: Internal Medicine

## 2017-07-24 ENCOUNTER — Encounter: Payer: Self-pay | Admitting: Internal Medicine

## 2017-07-24 VITALS — BP 118/70 | HR 79 | Ht 69.0 in | Wt 231.4 lb

## 2017-07-24 DIAGNOSIS — R0609 Other forms of dyspnea: Secondary | ICD-10-CM

## 2017-07-24 DIAGNOSIS — I10 Essential (primary) hypertension: Secondary | ICD-10-CM | POA: Diagnosis not present

## 2017-07-24 DIAGNOSIS — R0600 Dyspnea, unspecified: Secondary | ICD-10-CM

## 2017-07-24 NOTE — Assessment & Plan Note (Signed)
Continues to be well controlled off acei and would not rechallenge given the previous refractory non-specific resp complaints she developed while on ACEi   Follow up per Primary Care planned

## 2017-07-24 NOTE — Progress Notes (Signed)
Subjective:     Patient ID: Jennifer Cervantes, female   DOB: 10-18-1980,   MRN: [id number]    Brief patient profile:  52 yobf quit smoking 2017 and healthy most of her life x issues with wt and at  last IUP 2004 after lost all the excess baby wt still weighed around 330 then dx dm 2011 but still enjoyed good activity tol then bad sweats /vomiting 06/21/2015 > ER > dx IHD>> Stents done  and then says w/in an hour or two of cath developed cp ?>  Repeat cath next day = 06/22/15 no lesions  > sweating and vomiting completey resolved but cp continued and has persisted daily ever since then so referred to pulmonary clinic 04/16/2017 by Dr   Glade Lloyd   W/u by Havery Moros for chronic dysphagia (while on ACEi) with final note 09/03/16 Functional heartburn / Dyspepsia / Dysphagia - symptoms persist on protonix 40mg  BID. H pylori negative. Barium swallow without any pathology. Esophageal manometry normal. PH testing shows normal Demeester score, no significant nonacid reflux. We discussed that with her persistent symptoms, EGD is indicated, but we had hoped to avoid that given her recent cardiac history. I offered her a diagnostic EGD while on antiplatelet therapy if cardiology cleared her, but she declined - main thing to rule out would be EoE which could cause this. Otherwise, I suspect she more than likely has functional heartburn / dysphagia given her workup to date. I offered her a trial of TCA to help treat this (desipramine) and discussed risks / benefits. Following a discussion of this, she declined, she doesn't want to add any further medications to her regimen at this time. Up to her if she wishes to continue PPI at this time, if she feels worse stopping it, she can continue it as needed.      History of Present Illness  04/16/2017 1st [facility] office visit/ Rula Keniston   Chief Complaint  Patient presents with  . Pulmonary Consult    Referred by Chana Bode, PA. Pt c/o SOB off and on since had  cardiac stent placed in 2017. She states she occ gets SOB at rest but mainly with walking a long distance. She has CP every that she states is constant and both sharp and dull.   sob and cp have persisted every day since the stents placed which I think correlates exactly with rx with ACEi Cp is diffuse / ant L > R does not radiate / sometimes with nausea - note she really hasn't focused on this during her cards eval the latest of which 02/06/17 states "Cardiac wise she has some exertional dyspnea.  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 Off acei x 2 months/ did not bring meds but checked them all off our list and added ventolin to it and no gerd rx listed  Chief Complaint  Patient presents with  . Follow-up  Breathing has improved slightly. Her CP is unchanged. No new co's. She is using her ventolin 6 x per day on average.   Dyspnea:  No change:  Sometimes at rest/ every time walk nl speed gives out due to sob  Cough: not now/ no  longer dyphagia Sleep: choking immediately if lies flat / on 5 pillows does fine  SABA use: using  6 x daily even though it doesn't correct any of her symptoms convincingly and if anything "only works in the first min or two then wears off"  Cp same pattern x 2 y no change since last ov rec Only use your albuterol as a rescue medication to be used if you can't catch your breath    MCT 06/18/17 > neg    07/24/2017  f/u ov/Goro Wenrick re: unexplained sob/ still using saba tid  Not prn / denies any cp  now off gerd rx  Chief Complaint  Patient presents with  . Follow-up    Breathing has improved back to her normal baseline. She is using her albuterol inhaler 3 x per day.   Dyspnea:  Activity tol has improved, not exercising but able to walk blocks at a time  Cough: no Sleep: ok / no choking hs now SABA use: as above    No obvious day to day or daytime variability or assoc excess/ purulent sputum or mucus plugs or hemoptysis or cp or chest tightness, subjective wheeze or overt sinus or hb symptoms. No unusual exposure hx or h/o childhood pna/ asthma or knowledge of premature birth.  Sleeping  Fine   without nocturnal  or early am exacerbation  of respiratory  c/o's or need for noct saba. Also denies any obvious fluctuation of symptoms with weather or environmental changes or other aggravating or alleviating factors except as outlined above   Current Allergies, Complete Past Medical History, Past Surgical History, Family History, and Social History were reviewed in [facility] record.  ROS  The following are not active complaints unless bolded Hoarseness, sore throat, dysphagia, dental problems, itching, sneezing,  nasal congestion or discharge of excess mucus or purulent secretions, ear ache,   fever, chills, sweats, unintended wt loss or wt gain, classically pleuritic or exertional cp,  orthopnea pnd or arm/hand swelling  or leg swelling, presyncope, palpitations, abdominal pain, anorexia, nausea, vomiting, diarrhea  or change in bowel habits or change in bladder habits, change in stools or change in urine, dysuria, hematuria,  rash, arthralgias, visual complaints, headache, numbness, weakness or ataxia or problems with walking or coordination,  change in mood or  memory.        Current Meds  Medication Sig  . acetaminophen (TYLENOL) 500 MG tablet Take 500 mg by mouth every 6 (six) hours as needed for headache (pain).  [name] albuterol (PROVENTIL HFA) 108 (90 Base) MCG/ACT  inhaler Inhale 2 puffs into the lungs every 4 (four) hours as needed.  [name] aspirin 81 MG EC tablet Take 1 tablet (81 mg total) by mouth daily.  . Cholecalciferol (VITAMIN D) 2000 units tablet Take 1 tablet (2,000 Units total) by mouth daily.  . ferrous gluconate (FERGON) 324 MG tablet Take 1 tablet (324 mg total) by mouth daily with breakfast.  . Insulin Glargine (LANTUS SOLOSTAR) 100 UNIT/ML Solostar Pen Inject 20 Units into the skin daily at 10 pm.  . linaclotide (LINZESS) 145 MCG CAPS capsule Take 1 capsule (145 mcg total) by mouth daily before breakfast. (Patient taking differently: Take 145 mcg by  mouth daily as needed. )  . losartan-hydrochlorothiazide (HYZAAR) 100-25 MG tablet Take 1 tablet by mouth daily.  . metFORMIN (GLUCOPHAGE) 1000 MG tablet Take 1 tablet (1,000 mg total) by mouth 2 (two) times daily with a meal.  . metoprolol tartrate (LOPRESSOR) 100 MG tablet Take 50 mg by mouth 2 (two) times daily.  . nitroGLYCERIN (NITROSTAT) 0.4 MG SL tablet Place 1 tablet (0.4 mg total) under the tongue every 5 (five) minutes as needed for chest pain.  [name] oxyCODONE (OXY IR/ROXICODONE) 5 MG immediate release tablet Take 5 mg by mouth every 4 (four) hours as needed for breakthrough pain.  . pregabalin (LYRICA) 50 MG capsule Take one capsule TID.  Please call [phone] to schedule follow up or may request PCP to take over refills. (Patient taking differently: Take 50 mg by mouth 3 (three) times daily. )  . promethazine (PHENERGAN) 12.5 MG tablet Take 1 tablet (12.5 mg total) by mouth every 6 (six) hours as needed for nausea or vomiting.  . rosuvastatin (CRESTOR) 20 MG tablet Take 1 tablet (20 mg total) by mouth at bedtime.  [name] UNIFINE PENTIPS 32G X 4 MM MISC USE AS DIRECTED (USE WITH LANTUS PEN)                 Objective:   Physical Exam   Still belle affect s change   07/24/2017       231  06/11/2017       236   04/16/17 239 lb (108.4 kg)  03/27/17 240 lb (108.9 kg)  02/11/17 233 lb 6.4 oz  (105.9 kg)    Vital signs reviewed - Note on arrival 02 sats  99% on RA  And bp 118/70  HEENT: nl dentition, turbinates bilaterally, and oropharynx. Nl external ear canals without cough reflex   NECK :  without JVD/Nodes/TM/ nl carotid upstrokes bilaterally   LUNGS: no acc muscle use,  Nl contour chest which is clear to A and P bilaterally without cough on insp or exp maneuvers   CV:  RRR  no s3 or murmur or increase in P2, and no edema   ABD:  soft and nontender with nl inspiratory excursion in the supine position. No bruits or organomegaly appreciated, bowel sounds nl  MS:  Nl gait/ ext warm without deformities, calf tenderness, cyanosis or clubbing No obvious joint restrictions   SKIN: warm and dry without lesions    NEURO:  alert, approp, nl sensorium with  no motor or cerebellar deficits apparent.                 Assessment:

## 2017-07-24 NOTE — Assessment & Plan Note (Signed)
04/16/2017  Walked RA x 3 laps @ 185 ft each stopped due to  End of study, mild doe at fast pace, no desat, some sensation of chest tightness - Spirometry 04/16/2017  Nl with only abn = early (effort dep) portion of f/v loop  - trial off acei 04/16/2017 > 06/11/2017  dysphagia resolved but not cp or sob or choking at hs so rec continue max gerd rx and schedule MCT while on GERD Rx - MCT 06/18/17 > neg    All better at this point on less meds so strongly suspect adverse drug effects or psychosomatic illness but clearly no asthma and should not be using saba tid   Each maintenance medication was reviewed in detail including most importantly the difference between maintenance and as needed and under what circumstances the prns are to be used.  Please see AVS for specific  Instructions which are unique to this visit and I personally typed out  which were reviewed in detail in writing with the patient and a copy provided.    Pulmonary f/u is prn

## 2017-07-24 NOTE — Patient Instructions (Signed)
Only use your albuterol as a rescue medication to be used if you can't catch your breath by resting or doing a relaxed purse lip breathing pattern.  - The less you use it, the better it will work when you need it. - Ok to use up to 2 puffs  every 4 hours if you must but call for immediate appointment if use goes up over your usual need - Don't leave home without it !!  (think of it like the spare tire for your car)     If you are satisfied with your treatment plan,  let your doctor know and he/she can either refill your medications or you can return here when your prescription runs out.     If in any way you are not 100% satisfied,  please tell us.  If 100% better, tell your friends!  Pulmonary follow up is as needed   

## 2017-09-17 ENCOUNTER — Telehealth: Payer: Self-pay | Admitting: Neurology

## 2017-09-17 ENCOUNTER — Other Ambulatory Visit: Payer: Self-pay | Admitting: Cardiology

## 2017-09-17 ENCOUNTER — Ambulatory Visit
Admission: RE | Admit: 2017-09-17 | Discharge: 2017-09-17 | Disposition: A | Discharge status: Home / Self Care | Payer: 59 | Source: Ambulatory Visit | Attending: Medical | Admitting: Medical

## 2017-09-17 ENCOUNTER — Ambulatory Visit (INDEPENDENT_AMBULATORY_CARE_PROVIDER_SITE_OTHER): Payer: 59 | Admitting: Neurology

## 2017-09-17 ENCOUNTER — Encounter: Payer: Self-pay | Admitting: Medical

## 2017-09-17 ENCOUNTER — Ambulatory Visit (INDEPENDENT_AMBULATORY_CARE_PROVIDER_SITE_OTHER): Payer: 59 | Admitting: Medical

## 2017-09-17 ENCOUNTER — Other Ambulatory Visit: Payer: Self-pay | Admitting: Interventional Cardiology

## 2017-09-17 ENCOUNTER — Encounter

## 2017-09-17 ENCOUNTER — Encounter: Payer: Self-pay | Admitting: Neurology

## 2017-09-17 VITALS — BP 136/84 | HR 69 | Temp 98.4°F | Wt 239.6 lb

## 2017-09-17 DIAGNOSIS — M7731 Calcaneal spur, right foot: Secondary | ICD-10-CM | POA: Diagnosis not present

## 2017-09-17 DIAGNOSIS — M25571 Pain in right ankle and joints of right foot: Secondary | ICD-10-CM

## 2017-09-17 DIAGNOSIS — I8390 Asymptomatic varicose veins of unspecified lower extremity: Secondary | ICD-10-CM | POA: Diagnosis not present

## 2017-09-17 DIAGNOSIS — M79621 Pain in right upper arm: Secondary | ICD-10-CM

## 2017-09-17 DIAGNOSIS — E088 Diabetes mellitus due to underlying condition with unspecified complications: Secondary | ICD-10-CM | POA: Diagnosis not present

## 2017-09-17 DIAGNOSIS — Z113 Encounter for screening for infections with a predominantly sexual mode of transmission: Secondary | ICD-10-CM | POA: Diagnosis not present

## 2017-09-17 DIAGNOSIS — G8929 Other chronic pain: Secondary | ICD-10-CM

## 2017-09-17 DIAGNOSIS — E7849 Other hyperlipidemia: Secondary | ICD-10-CM | POA: Diagnosis not present

## 2017-09-17 DIAGNOSIS — R0989 Other specified symptoms and signs involving the circulatory and respiratory systems: Secondary | ICD-10-CM

## 2017-09-17 DIAGNOSIS — M79609 Pain in unspecified limb: Secondary | ICD-10-CM | POA: Insufficient documentation

## 2017-09-17 MED ORDER — PREGABALIN 50 MG PO CAPS: 50.0000 mg | ORAL_CAPSULE | Freq: Three times a day (TID) | ORAL | 5 refills | Status: DC

## 2017-09-17 MED ORDER — DULOXETINE HCL 60 MG PO CPEP: 60.0000 mg | ORAL_CAPSULE | Freq: Every day | ORAL | 12 refills | Status: DC

## 2017-09-17 MED FILL — ROSUVASTATIN CALCIUM 20 MG: 20 | 90 days supply | Qty: 90 | Fill #1

## 2017-09-17 MED FILL — NITROGLYCERIN 0.4 MG TAB SL: 0.4 | 13 days supply | Qty: 25 | Fill #0

## 2017-09-17 MED FILL — XARELTO 2.5 MG TABS: 2.5 | 30 days supply | Qty: 60 | Fill #1

## 2017-09-17 MED FILL — METOPROLOL TARTRATE 50 MG T: 50 | 30 days supply | Qty: 60 | Fill #0

## 2017-09-17 MED FILL — DULoxetine HCL 60 MG CPEP: 60 | 30 days supply | Qty: 30 | Fill #0

## 2017-09-17 MED FILL — metFORMIN HCL 1000 MG TABS: 1000 | 90 days supply | Qty: 180 | Fill #1

## 2017-09-17 NOTE — Progress Notes (Signed)
Subjective: Chief Complaint  Patient presents with  . other    veins in right leg pain and sticking out , no numbness, no tingling    Here for several acute concerns.  Medical team Cardiology Dr. Adah Perl, [facility], gyn Dr. Krista Blue, neurology  Kay Shippy, Camelia Eng, PA-C here for primary care   She has ongoing right ankle pain.  Thinks she may have turned her ankle a few months ago but no specific injury.  She has tried some exercises with resistance bands but not helping.  Mostly outside of right ankle  She says that her varicose veins in her right leg give her pain achy type pain on a regular basis.  No redness no swelling no bruising no recent injury no trauma.  No leg swelling.  She does use over-the-counter compression hose  She  request labs today to check her kidney and liver as well as an HIV test  She denies any kind of symptoms concerning for STD, no new partners, just wants testing.   She has not scheduled for a med check but has not been back of her diabetes in several months  Regarding diabetes she says she only uses her insulin medication to 3 days/week if she has trouble pushing down the pen trigger and does not do well with this.  Wants to use some other type of medication.  Not checking sugars  Past Medical History:  Diagnosis Date  . CAD (coronary artery disease)    a. LHC on 06/21/15 which showed 75% occl mid-dist LCx, 40% occl mRCA, 99% mid-dist LAD s/p DES and 50% occl mLAD s/p DES (overlapping stents) and significant LV dysfunction with anteroapical HK; b. 06/2015 Myoview: EF 55-65%, no ischemia/infarct.  . CHF (congestive heart failure) ([place])   . GERD (gastroesophageal reflux disease)   . Hyperlipidemia   . Hypertension   . Hypothyroidism   . Myocardial stunning (HCC)    a. EF 45% on 2D ECHO on 06/21/15 and severe LV dysfunction on LV gram by cath. Repeat 2D ECHO with EF 55-60% and mild HK of the apicoseptal myocardium.  [name] Neuropraxia of right median nerve    a. suspected  after right radial cath. will follow with neuro outpatient if does not resolve.   . NSTEMI (non-ST elevated myocardial infarction) [facility]) 05/2015   [facility]  . Obesity   . 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 05/2015 Following PCI (radial access)-->conservatively managed.  . Tobacco abuse   . Tricuspid regurgitation   . Type II diabetes mellitus ([place])    a. Type II  . Vitamin D deficiency    Current Outpatient Medications on File Prior to Visit  Medication Sig Dispense Refill  . acetaminophen (TYLENOL) 500 MG tablet Take 500 mg by mouth every 6 (six) hours as needed for headache (pain).    [name] albuterol (PROVENTIL HFA) 108 (90 Base) MCG/ACT inhaler Inhale 2 puffs into the lungs every 4 (four) hours as needed.    . linaclotide (LINZESS) 145 MCG CAPS capsule Take 1 capsule (145 mcg total) by mouth daily before breakfast. (Patient taking differently: Take 145 mcg by mouth daily as needed. ) 90 capsule 1  . losartan-hydrochlorothiazide (HYZAAR) 100-25 MG tablet Take 1 tablet by mouth daily. 30 tablet 11  . metoprolol tartrate (LOPRESSOR) 100 MG tablet Take 50 mg by mouth 2 (two) times daily.    [name] oxyCODONE (OXY IR/ROXICODONE) 5 MG immediate release tablet Take 5 mg by mouth every 4 (four) hours as  needed for breakthrough pain.    . promethazine (PHENERGAN) 12.5 MG tablet Take 1 tablet (12.5 mg total) by mouth every 6 (six) hours as needed for nausea or vomiting. 15 tablet 0  . DULoxetine (CYMBALTA) 60 MG capsule Take 1 capsule (60 mg total) by mouth daily. 30 capsule 12  . pregabalin (LYRICA) 50 MG capsule Take 1 capsule (50 mg total) by mouth 3 (three) times daily. 90 capsule 5   No current facility-administered medications on file prior to visit.    ROS as in subjective  Objective: BP 136/84 (BP Location: Left Arm, Patient Position: Sitting)   Pulse 69   Temp 98.4 F (36.9 C)   Wt 239 lb 9.6 oz (108.7 kg)   SpO2 99%   BMI 35.38 kg/m   Gen: wd, wn, nad Skin:  Unremarkable Tender over anterior right ankle and ATFL region but no other tenderness, there is mild general pain with range of motion of the ankle, no obvious laxity rest of bilateral ankle foot exam is unremarkable Mild to moderate varicosities of the right lower leg with mild tenderness in the same area but no obvious thrombosed vein, no palpable cord, no asymmetry compared to the left leg Pulses are 1+ bilateral lower extremity no edema    Assessment: Encounter Diagnoses  Name Primary?  . Chronic pain of right ankle Yes  . Diabetes mellitus due to underlying condition with complication, without long-term current use of insulin ([place])   . Other hyperlipidemia   . Screen for STD (sexually transmitted disease)   . Varicose veins of lower extremity, unspecified laterality, unspecified whether complicated   . Decreased pulses in feet     Plan: Ankle pain-she will go for x-ray.  No specific injury reported.  If negative x-ray, refer to physical therapy  Diabetes- labs today, she has been noncompliant with follow-up and medication.  She is having trouble using pen device due to arm weakness and pain.  She has had chronic issues with her right arm.  Consider alternate medications  Hyperlipidemia-lab today  STD screen at her request today, counseled on safe sex and condom use  Varicose veins-pending labs consider prescription compression hose.  Discussed short-term use of 325 mg aspirin, heat, leg elevation.  She is exercising some, or referral to vein specialist.  I reviewed the 07/2016 venous Doppler ultrasound.  I do not see that she has had an ABI, may pursue this as well  Jaynee was seen today for other.  Diagnoses and all orders for this visit:  Chronic pain of right ankle -     DG Ankle Complete Right; Future -     Ambulatory referral to Physical Therapy  Diabetes mellitus due to underlying condition with complication, without long-term current use of insulin (HCC) -      Comprehensive metabolic panel -     CBC -     Hemoglobin A1c -     Lipid panel -     VAS [place] ABI WITH/WO TBI; Future  Other hyperlipidemia -     Comprehensive metabolic panel -     CBC -     Hemoglobin A1c -     Lipid panel  Screen for STD (sexually transmitted disease) -     HIV antibody -     GC/Chlamydia Probe Amp -     RPR  Varicose veins of lower extremity, unspecified laterality, unspecified whether complicated -     VAS [place] ABI WITH/WO TBI; Future  Decreased pulses in  feet -     VAS [place] ABI WITH/WO TBI; Future   F/u pending labs, Xray

## 2017-09-17 NOTE — Telephone Encounter (Signed)
This is Dr. Koneswaran's pt. °

## 2017-09-17 NOTE — Telephone Encounter (Signed)
[facility] UMR/medicaid order sent to GI. They will reach out the pt to schedule.

## 2017-09-17 NOTE — Progress Notes (Signed)
Jennifer Cervantes     PATIENTAdline Cervantes DOB: 01-06-1981  Chief Complaint  Patient presents with  . Numbness    She has continued to have right-sided numbness in her arm and leg.  She also has intermittent tingling in right hand/fingers.  Lyrica 50mg  TID is not as helpful as when she first started the medication,.      HISTORICAL  Jennifer Cervantes is a 37 years old right-handed female , seen in refer by  Cardiologist Dr. Daneen Schick for evaluation of right leg and groin pain, initial evaluation was October 13 2016.  I reviewed and summarized referring note, she had a history of diabetes, was admitted to the hospital in April 2017 complains of chest pain, she underwent coronary artery catheterization through the right brachial artery access.  She did develop significant swelling of the right forearm and hand postprocedure, she also reported numbness, weakness of right hand, and discomfort, difficulty using her right hand, but she complained of ongoing paresthesias and subjective weakness of her right hand, was subsequently evaluated by my colleague Dr. Jannifer Franklin in May 2017, EMG nerve conduction study on August 29 2015 was reported normal, during the visit, Dr. Jannifer Franklin also documented that her right hand paresthesia and weakness has nearly completely cleared.  Patient is referred back by her cardiologist this time following her cardiac catheterization in May 2018 through right femoral artery, she complains of persistent discomfort and pain at right groin area  She had cardiac catheter on Jul 17 2016 by Dr. Glenetta Hew, there was minimum changes from post PCI angiographic findings from February 2018, no new lesions to explain her complains of chest pain, consider spasm versus not anginal chest pain  Patient complains that since her recent cardiac catheter in May 2018, she had a persistent right groin area discomfort, tenderness upon deep palpitation, right leg felt numb, occasionally right leg give out  underneath her,  She already had Doppler study of right groin on Jul 17 2016, artery and veins were patent, there were noted multiple enlarged lympha nodes.  She also complains of passing out episode, she described lightheadedness when getting up from seated position, in early August 2018, when she got up using bathroom after prolonged sitting, she felt lightheadedness, dizzy, it can happen anytime of the day, but usually related to sudden positional change getting up quickly from seated position.  She reported to evaluation at the [facility] there was no significant abnormality found.  Lab, A1c 7.3, today patient also complains of persistent right arm and the hand paresthesia and weakness reported this has been ongoing since April 2017, despite chart has document that her symptoms has nearly completely resolved previously.  Update October 31 2016: She returned for electrodiagnostic study today, which is normal, there is no evidence of right upper lower extremity neuropathy, right cervical radiculopathy, or right lumbosacral radiculopathy.  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 acetaminophen (TYLENOL) 500 MG tablet Take 500 mg by mouth every 6 (six) hours as needed for headache (pain).    [name] albuterol (PROVENTIL HFA) 108 (90 Base) MCG/ACT inhaler Inhale 2 puffs into the lungs every 4 (four) hours as needed.    [name] aspirin 81 MG EC tablet Take 1 tablet (81 mg total) by mouth daily. 90 tablet 3  . Cholecalciferol (VITAMIN D) 2000 units tablet Take 1 tablet (2,000 Units total) by mouth daily. 90 tablet 3  . ferrous gluconate (FERGON) 324 MG tablet Take 1 tablet (324 mg total) by mouth daily with breakfast. 90 tablet 1  . Insulin Glargine (LANTUS SOLOSTAR) 100 UNIT/ML Solostar Pen Inject 20 Units into the skin daily at 10 pm. 5 pen 5  . linaclotide (LINZESS) 145 MCG CAPS capsule Take 1 capsule (145 mcg total) by mouth daily before breakfast. (Patient taking differently: Take 145 mcg by mouth daily as needed. ) 90 capsule 1  . losartan-hydrochlorothiazide (HYZAAR) 100-25 MG tablet Take 1 tablet by mouth daily. 30 tablet 11  . metFORMIN (GLUCOPHAGE) 1000 MG tablet Take 1 tablet (1,000 mg total) by mouth 2 (two) times daily with a meal. 180 tablet 3  . metoprolol tartrate (LOPRESSOR) 100 MG tablet Take 50 mg by mouth 2 (two) times daily.    [name] oxyCODONE (OXY IR/ROXICODONE) 5 MG immediate release tablet Take 5 mg by mouth every 4 (four) hours as needed for breakthrough pain.    . pregabalin (LYRICA) 50 MG capsule Take one capsule TID.  Please call [phone] to schedule follow up or may request PCP to take over refills. (Patient taking differently: Take 50 mg by mouth 3 (three) times daily. ) 90 capsule 5  . promethazine (PHENERGAN) 12.5 MG tablet Take 1 tablet (12.5 mg total) by mouth every 6 (six) hours as needed for nausea or vomiting. 15 tablet 0  . rosuvastatin (CRESTOR) 20 MG tablet Take 1 tablet (20 mg total) by mouth at bedtime. 90 tablet 3    . UNIFINE PENTIPS 32G X 4 MM MISC USE AS DIRECTED (USE WITH LANTUS PEN) 100 each 5  . metoprolol tartrate (LOPRESSOR) 50 MG tablet TAKE 1 TABLET BY MOUTH 2 TIMES DAILY. 60 tablet 6  . nitroGLYCERIN (NITROSTAT) 0.4 MG SL tablet PLACE 1 TABLET UNDER THE TONGUE EVERY 5 MINUTES AS NEEDED FOR CHEST PAIN. 25 tablet 3   No current facility-administered medications for this visit.     PAST MEDICAL HISTORY: Past Medical History:  Diagnosis Date  . CAD (coronary artery disease)    a. LHC on 06/21/15 which showed 75% occl mid-dist LCx, 40% occl mRCA, 99% mid-dist LAD s/p DES and 50% occl mLAD s/p DES (overlapping stents) and significant LV dysfunction with anteroapical HK; b. 06/2015 Myoview: EF 55-65%, no ischemia/infarct.  . CHF (congestive heart failure) ([place])   . GERD (gastroesophageal reflux disease)   . Hyperlipidemia   . Hypertension   . Hypothyroidism   . Myocardial stunning (HCC)    a. EF 45% on 2D ECHO on 06/21/15 and severe LV dysfunction on LV gram by cath. Repeat 2D ECHO with EF 55-60% and mild HK of the apicoseptal myocardium.  [name] Neuropraxia  of right median nerve    a. suspected after right radial cath. will follow with neuro outpatient if does not resolve.   . NSTEMI (non-ST elevated myocardial infarction) [facility]) 05/2015   [facility]  . Obesity   . 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 05/2015 Following PCI (radial access)-->conservatively managed.  . Tobacco abuse   . Tricuspid regurgitation   . Type II diabetes mellitus ([place])    a. Type II  . Vitamin D deficiency     PAST SURGICAL HISTORY: Past Surgical History:  Procedure Laterality Date  . [place] STUDY N/A 07/14/2016   Procedure: [place] STUDY;  Surgeon: Mauri Pole, MD;  Location: [facility];  Service: Endoscopy;  Laterality: N/A;  . CARDIAC CATHETERIZATION N/A 06/21/2015   Procedure: Left Heart Cath and Coronary Angiography;  Surgeon: Belva Crome, MD; pLAD 99%, CFX 75%, RCA 40%, EF 35%   . CARDIAC  CATHETERIZATION N/A 06/21/2015   Procedure: Coronary Stent Intervention;  Surgeon: Belva Crome, MD;  Promus Premier DES, 3.0 x 20 and 3.5 x 32 mm stents to the LAD  . CARDIAC CATHETERIZATION N/A 06/21/2015   Procedure: Intravascular Ultrasound/IVUS;  Surgeon: Belva Crome, MD;  Location: [place] [facility];  Service: Cardiovascular;  Laterality: N/A;  LAD  . CARDIAC CATHETERIZATION  06/22/2015   Procedure: Coronary/Graft Angiography;  Surgeon: Peter M Martinique, MD;  Location: [place] [facility];  Service: Cardiovascular;;  . CORONARY STENT INTERVENTION N/A 04/02/2016   Procedure: Coronary Stent Intervention;  Surgeon: Jettie Booze, MD;  Location: [place] [facility];  Service: Cardiovascular;  Laterality: N/A;  . ESOPHAGEAL MANOMETRY N/A 07/14/2016   Procedure: ESOPHAGEAL MANOMETRY (EM);  Surgeon: Mauri Pole, MD;  Location: [facility];  Service: Endoscopy;  Laterality: N/A;  . LEFT HEART CATH AND CORONARY ANGIOGRAPHY N/A 04/02/2016   Procedure: Left Heart Cath and Coronary Angiography;  Surgeon: Jettie Booze, MD;  Location: [place] [facility];  Service: Cardiovascular;  Laterality: N/A;  . LEFT HEART CATH AND CORONARY ANGIOGRAPHY N/A 07/17/2016   Procedure: Left Heart Cath and Coronary Angiography;  Surgeon: Leonie Man, MD;  Location: [place] [facility];  Service: Cardiovascular;  Laterality: N/A;    FAMILY HISTORY: Family History  Problem Relation Age of Onset  . Diabetes Mother   . Healthy Father   . Healthy Sister   . Healthy Brother   . Diabetes Maternal Grandmother   . Heart disease Maternal Grandmother   . Diabetes Maternal Aunt        x 2  . Heart attack Neg Hx   . Heart failure Neg Hx   . Stroke Neg Hx   . Hyperlipidemia Neg Hx   . Hypertension Neg Hx   . Stomach cancer Neg Hx   . Colon cancer Neg Hx     SOCIAL HISTORY:  Social History   Socioeconomic History  . Marital status: Single    Spouse name: Not on file  . Number of children: 4  .  Years of education: 23  . Highest education level: Not on file  Occupational History  . Occupation: Forensic psychologist: [employer]  . Financial resource strain: Not on file  . Food insecurity:    Worry: Not on file    Inability: Not on file  . Transportation needs:    Medical: Not on file    Non-medical: Not on file  Tobacco Use  .  Smoking status: Former Smoker    Packs/day: 0.50    Years: 23.00    Pack years: 11.50    Types: Cigarettes    Last attempt to quit: 04/02/2015    Years since quitting: 2.4  . Smokeless tobacco: Never Used  Substance and Sexual Activity  . Alcohol use: No    Alcohol/week: 0.0 oz  . Drug use: No  . Sexual activity: Not Currently    Partners: Male  Lifestyle  . Physical activity:    Days per week: Not on file    Minutes per session: Not on file  . Stress: Not on file  Relationships  . Social connections:    Talks on phone: Not on file    Gets together: Not on file    Attends religious service: Not on file    Active member of club or organization: Not on file    Attends meetings of clubs or organizations: Not on file    Relationship status: Not on file  . Intimate partner violence:    Fear of current or ex partner: Not on file    Emotionally abused: Not on file    Physically abused: Not on file    Forced sexual activity: Not on file  Other Topics Concern  . Not on file  Social History Narrative   Lives in [place], works nights at [employer], [place]   Right-handed   Drinks 1 soda a day   Exercises some with walking   Lives with her 33 yo son   Likes her space, doesn't want a significant other   03/2016     PHYSICAL EXAM   Vitals:   09/17/17 1332  BP: 118/72  Pulse: 69  Weight: 238 lb (108 kg)  Height: 5\' 9"  (1.753 m)    Not recorded      Body mass index is 35.15 kg/m.  PHYSICAL EXAMNIATION:  Gen: NAD, conversant, well nourised, obese, well groomed                     Cardiovascular: Regular rate rhythm, no  peripheral edema, warm, nontender. Eyes: Conjunctivae clear without exudates or hemorrhage Neck: Supple, no carotid bruits. Pulmonary: Clear to auscultation bilaterally   NEUROLOGICAL EXAM:  MENTAL STATUS: Speech:    Speech is normal; fluent and spontaneous with normal comprehension.  Cognition:     Orientation to time, place and person     Normal recent and remote memory     Normal Attention span and concentration     Normal Language, naming, repeating,spontaneous speech     Fund of knowledge   CRANIAL NERVES: CN II: Visual fields are full to confrontation. Fundoscopic exam is normal with sharp discs and no vascular changes. Pupils are round equal and briskly reactive to light. CN III, IV, VI: extraocular movement are normal. No ptosis. CN V: Facial sensation is intact to pinprick in all 3 divisions bilaterally. Corneal responses are intact.  CN VII: Face is symmetric with normal eye closure and smile. CN VIII: Hearing is normal to rubbing fingers CN IX, X: Palate elevates symmetrically. Phonation is normal. CN XI: Head turning and shoulder shrug are intact CN XII: Tongue is midline with normal movements and no atrophy.  MOTOR: Variable effort on motor examination, fixation of right arm up on rapid rotating movement, but I do not see any significant muscle weakness,  REFLEXES: Reflexes are 2+ and symmetric at the biceps, triceps, knees, and ankles. Plantar responses are flexor.  SENSORY:  Intact to light touch, pinprick, positional sensation and vibratory sensation are intact in fingers and toes.  COORDINATION: Rapid alternating movements and fine finger movements are intact. There is no dysmetria on finger-to-nose and heel-knee-shin.    GAIT/STANCE: Antalgic, variable effort while ambulate   DIAGNOSTIC DATA (LABS, IMAGING, TESTING) - I reviewed patient records, labs, notes, testing and imaging myself where available.   ASSESSMENT AND PLAN  Jennifer Cervantes is a 37  y.o. female   Right arm and leg paresthesia   MRI of the brain was normal,  Electrodiagnostic study was normal in specific there is no evidence of right upper lower extremity neuropathy, right cervical lumbosacral radiculopathy.  She continue complains of persistent right sided paresthesia, unsure etiology, has tried gabapentin with limited help, continue Lyrica 50 mg 3 times a day   And on Cymbalta 60 mg daily  Marcial Pacas, M.D. Ph.D.  [facility] [address], [place], [place] [address] Ph: [phone] Fax: [phone]  CC: Referring Provider

## 2017-09-18 ENCOUNTER — Other Ambulatory Visit: Payer: Self-pay | Admitting: Medical

## 2017-09-18 ENCOUNTER — Other Ambulatory Visit: Payer: Self-pay

## 2017-09-18 LAB — CBC
HEMOGLOBIN: 12.8 g/dL (ref 11.1–15.9)
Hematocrit: 38.7 % (ref 34.0–46.6)
MCH: 31.4 pg (ref 26.6–33.0)
MCHC: 33.1 g/dL (ref 31.5–35.7)
MCV: 95 fL (ref 79–97)
PLATELETS: 288 10*3/uL (ref 150–450)
RBC: 4.07 x10E6/uL (ref 3.77–5.28)
RDW: 13.8 % (ref 12.3–15.4)
WBC: 7.5 10*3/uL (ref 3.4–10.8)

## 2017-09-18 LAB — COMPREHENSIVE METABOLIC PANEL
A/G RATIO: 1.8 (ref 1.2–2.2)
ALK PHOS: 87 IU/L (ref 39–117)
ALT: 16 IU/L (ref 0–32)
AST: 17 IU/L (ref 0–40)
Albumin: 4.8 g/dL (ref 3.5–5.5)
BUN/Creatinine Ratio: 10 (ref 9–23)
BUN: 10 mg/dL (ref 6–20)
Bilirubin Total: 0.4 mg/dL (ref 0.0–1.2)
CALCIUM: 10.1 mg/dL (ref 8.7–10.2)
CO2: 22 mmol/L (ref 20–29)
Chloride: 100 mmol/L (ref 96–106)
Creatinine, Ser: 1.01 mg/dL — ABNORMAL HIGH (ref 0.57–1.00)
GFR calc Af Amer: 82 mL/min/{1.73_m2} (ref >59)
GFR, EST NON AFRICAN AMERICAN: 71 mL/min/{1.73_m2} (ref >59)
Globulin, Total: 2.7 g/dL (ref 1.5–4.5)
Glucose: 182 mg/dL — ABNORMAL HIGH (ref 65–99)
Potassium: 3.9 mmol/L (ref 3.5–5.2)
SODIUM: 140 mmol/L (ref 134–144)
Total Protein: 7.5 g/dL (ref 6.0–8.5)

## 2017-09-18 LAB — HEMOGLOBIN A1C
Est. average glucose Bld gHb Est-mCnc: 169 mg/dL
HEMOGLOBIN A1C: 7.5 % — AB (ref 4.8–5.6)

## 2017-09-18 LAB — HIV ANTIBODY (ROUTINE TESTING W REFLEX): HIV SCREEN 4TH GENERATION: NONREACTIVE

## 2017-09-18 LAB — LIPID PANEL
CHOLESTEROL TOTAL: 138 mg/dL (ref 100–199)
Chol/HDL Ratio: 2.8 ratio (ref 0.0–4.4)
HDL: 50 mg/dL (ref >39)
LDL Calculated: 70 mg/dL (ref 0–99)
Triglycerides: 91 mg/dL (ref 0–149)
VLDL Cholesterol Cal: 18 mg/dL (ref 5–40)

## 2017-09-18 LAB — RPR: RPR: NONREACTIVE

## 2017-09-18 MED ORDER — VITAMIN D 50 MCG (2000 UT) PO TABS: 2000.0000 [IU] | ORAL_TABLET | Freq: Every day | ORAL | 3 refills | Status: DC

## 2017-09-18 MED ORDER — ROSUVASTATIN CALCIUM 20 MG PO TABS: 20.0000 mg | ORAL_TABLET | Freq: Every day | ORAL | 3 refills | Status: DC

## 2017-09-18 MED ORDER — ASPIRIN 81 MG PO TBEC: 81.0000 mg | DELAYED_RELEASE_TABLET | Freq: Every day | ORAL | 3 refills | Status: DC

## 2017-09-18 MED ORDER — EMPAGLIFLOZIN-METFORMIN HCL 5-1000 MG PO TABS: 1.0000 | ORAL_TABLET | Freq: Two times a day (BID) | ORAL | 3 refills | Status: DC

## 2017-09-18 MED FILL — SYNJARDY 5-1,000 MG TABLET: 5-1000 | 30 days supply | Qty: 60 | Fill #0

## 2017-09-19 LAB — GC/CHLAMYDIA PROBE AMP
Chlamydia trachomatis, NAA: NEGATIVE
NEISSERIA GONORRHOEAE BY PCR: NEGATIVE

## 2017-09-22 ENCOUNTER — Other Ambulatory Visit: Payer: Self-pay

## 2017-09-23 DIAGNOSIS — Z01419 Encounter for gynecological examination (general) (routine) without abnormal findings: Secondary | ICD-10-CM | POA: Diagnosis not present

## 2017-09-23 DIAGNOSIS — N926 Irregular menstruation, unspecified: Secondary | ICD-10-CM | POA: Diagnosis not present

## 2017-09-23 DIAGNOSIS — Z6835 Body mass index (BMI) 35.0-35.9, adult: Secondary | ICD-10-CM | POA: Diagnosis not present

## 2017-09-30 ENCOUNTER — Encounter: Payer: Self-pay | Admitting: Neurology

## 2017-09-30 ENCOUNTER — Other Ambulatory Visit: Payer: Self-pay | Admitting: *Deleted

## 2017-09-30 ENCOUNTER — Telehealth: Payer: Self-pay | Admitting: *Deleted

## 2017-09-30 DIAGNOSIS — R202 Paresthesia of skin: Secondary | ICD-10-CM

## 2017-09-30 DIAGNOSIS — R531 Weakness: Secondary | ICD-10-CM

## 2017-09-30 NOTE — Telephone Encounter (Signed)
Received email bleow from patient:  Can we add my back to the MRI that I am having done this Fri.  Dr. Krista Blue has reviewed her chart and feels this is a reasonable request.  She has authorized orders for MRI lumbar spine wo to be placed.  The patient would like this scan to be scheduled at the same time as her MRI cervical spine wo.

## 2017-10-02 ENCOUNTER — Other Ambulatory Visit: Payer: 59

## 2017-10-07 ENCOUNTER — Emergency Department (HOSPITAL_COMMUNITY)
Admission: EM | Admit: 2017-10-07 | Discharge: 2017-10-08 | Disposition: A | Discharge status: Home / Self Care | Payer: 59 | Attending: Emergency Medicine | Admitting: Emergency Medicine

## 2017-10-07 ENCOUNTER — Other Ambulatory Visit: Payer: Self-pay | Admitting: Medical

## 2017-10-07 ENCOUNTER — Ambulatory Visit (INDEPENDENT_AMBULATORY_CARE_PROVIDER_SITE_OTHER): Payer: 59 | Admitting: Family Medicine

## 2017-10-07 ENCOUNTER — Telehealth: Payer: Self-pay | Admitting: *Deleted

## 2017-10-07 ENCOUNTER — Other Ambulatory Visit: Payer: Self-pay

## 2017-10-07 ENCOUNTER — Encounter (HOSPITAL_COMMUNITY): Payer: Self-pay | Admitting: Emergency Medicine

## 2017-10-07 ENCOUNTER — Emergency Department (HOSPITAL_COMMUNITY): Payer: 59

## 2017-10-07 ENCOUNTER — Encounter: Payer: Self-pay | Admitting: Family Medicine

## 2017-10-07 VITALS — BP 110/78 | HR 80 | Temp 98.3°F | Resp 16 | Wt 233.0 lb

## 2017-10-07 DIAGNOSIS — Z87891 Personal history of nicotine dependence: Secondary | ICD-10-CM | POA: Diagnosis not present

## 2017-10-07 DIAGNOSIS — E119 Type 2 diabetes mellitus without complications: Secondary | ICD-10-CM | POA: Insufficient documentation

## 2017-10-07 DIAGNOSIS — R52 Pain, unspecified: Secondary | ICD-10-CM

## 2017-10-07 DIAGNOSIS — I5032 Chronic diastolic (congestive) heart failure: Secondary | ICD-10-CM | POA: Diagnosis not present

## 2017-10-07 DIAGNOSIS — M79601 Pain in right arm: Secondary | ICD-10-CM

## 2017-10-07 DIAGNOSIS — R0989 Other specified symptoms and signs involving the circulatory and respiratory systems: Secondary | ICD-10-CM | POA: Diagnosis not present

## 2017-10-07 DIAGNOSIS — Z79899 Other long term (current) drug therapy: Secondary | ICD-10-CM | POA: Diagnosis not present

## 2017-10-07 DIAGNOSIS — I110 Hypertensive heart disease with heart failure: Secondary | ICD-10-CM | POA: Diagnosis not present

## 2017-10-07 DIAGNOSIS — M79621 Pain in right upper arm: Secondary | ICD-10-CM | POA: Diagnosis not present

## 2017-10-07 DIAGNOSIS — I25119 Atherosclerotic heart disease of native coronary artery with unspecified angina pectoris: Secondary | ICD-10-CM | POA: Diagnosis not present

## 2017-10-07 DIAGNOSIS — Z7982 Long term (current) use of aspirin: Secondary | ICD-10-CM | POA: Insufficient documentation

## 2017-10-07 DIAGNOSIS — R600 Localized edema: Secondary | ICD-10-CM | POA: Diagnosis not present

## 2017-10-07 LAB — CBC
HEMATOCRIT: 40.2 % (ref 36.0–46.0)
Hemoglobin: 13.7 g/dL (ref 12.0–15.0)
MCH: 31.9 pg (ref 26.0–34.0)
MCHC: 34.1 g/dL (ref 30.0–36.0)
MCV: 93.7 fL (ref 78.0–100.0)
Platelets: 252 10*3/uL (ref 150–400)
RBC: 4.29 MIL/uL (ref 3.87–5.11)
RDW: 12.6 % (ref 11.5–15.5)
WBC: 9.9 10*3/uL (ref 4.0–10.5)

## 2017-10-07 LAB — BASIC METABOLIC PANEL
ANION GAP: 9 (ref 5–15)
BUN: 18 mg/dL (ref 6–20)
CALCIUM: 9.5 mg/dL (ref 8.9–10.3)
CO2: 22 mmol/L (ref 22–32)
Chloride: 106 mmol/L (ref 98–111)
Creatinine, Ser: 1.38 mg/dL — ABNORMAL HIGH (ref 0.44–1.00)
GFR calc Af Amer: 55 mL/min — ABNORMAL LOW (ref >60)
GFR calc non Af Amer: 48 mL/min — ABNORMAL LOW (ref >60)
GLUCOSE: 162 mg/dL — AB (ref 70–99)
Potassium: 3.8 mmol/L (ref 3.5–5.1)
Sodium: 137 mmol/L (ref 135–145)

## 2017-10-07 LAB — I-STAT BETA HCG BLOOD, ED (MC, WL, AP ONLY): I-stat hCG, quantitative: <5 m[IU]/mL (ref <5)

## 2017-10-07 MED ORDER — IOHEXOL 300 MG/ML  SOLN
100.0000 mL | Freq: Once | INTRAMUSCULAR | Status: AC | PRN
  Administered 2017-10-07: 100 mL via INTRAVENOUS

## 2017-10-07 MED ORDER — ONDANSETRON HCL 4 MG PO TABS: 4.0000 mg | ORAL_TABLET | Freq: Every day | ORAL | 0 refills | Status: AC | PRN

## 2017-10-07 MED FILL — ONDANSETRON HCL 4 MG TABLET: 4 | 5 days supply | Qty: 20 | Fill #0

## 2017-10-07 NOTE — ED Notes (Signed)
Patient transported to CT 

## 2017-10-07 NOTE — Consult Note (Signed)
[facility]ist [facility] [place]  Patient name: Jennifer Cervantes MRN: [id number] DOB: 05-Jan-1981 Sex: female   HPI: Jennifer Cervantes is a 37 y.o. female seen in the [facility] emergency room for complaints of right arm pain.  She has a very complex past history.  She underwent coronary arteriography via right radial artery in April 2017 was found to have critical coronary disease and underwent stenting.  She had significant pain and numbness and weakness following this and was evaluated by Dr. Bridgett Larsson.  Ultrasounds that time suggested probable occlusion of her radial artery.  She had good full ulnar flow and an intact palmar arch and observation only was recommended.  She had had no evidence of ongoing ischemia but is continued to have severe pain and dysfunction associated with her right hand.  She is gone undergone an extensive work-up over the past 2-1/2 years to include nerve conduction studies on several occasions and also MRI of her brain.  She was most recently seen by neurology approximately 2 weeks ago with no new findings.  She did have a subsequent cardiac catheterization and re-intervention in 2018.  Had severe right groin pain and right leg weakness following arteriography and reports that she is just finally now recovering weakness in her right leg.  Work-up to include arterial and venous studies of her right groin showed no evidence of arterial or venous injury.  She presented to the [facility] ER this evening with worsening pain in her right arm.  This is mainly on the radial aspect from her elbow down into her hand.  She reports that this is much worse than when she was seen by neurology 2 weeks ago and actually this worsened on Sunday 3 days ago.  She is on chronic Xarelto anticoagulation.  She denies any trauma to her right arm.  She has had no new cardiac difficulties.  Past Medical History:  Diagnosis Date  . CAD (coronary artery  disease)    a. LHC on 06/21/15 which showed 75% occl mid-dist LCx, 40% occl mRCA, 99% mid-dist LAD s/p DES and 50% occl mLAD s/p DES (overlapping stents) and significant LV dysfunction with anteroapical HK; b. 06/2015 Myoview: EF 55-65%, no ischemia/infarct.  . CHF (congestive heart failure) ([place])   . GERD (gastroesophageal reflux disease)   . Hyperlipidemia   . Hypertension   . Hypothyroidism   . Myocardial stunning (HCC)    a. EF 45% on 2D ECHO on 06/21/15 and severe LV dysfunction on LV gram by cath. Repeat 2D ECHO with EF 55-60% and mild HK of the apicoseptal myocardium.  [name] Neuropraxia of right median nerve    a. suspected after right radial cath. will follow with neuro outpatient if does not resolve.   . NSTEMI (non-ST elevated myocardial infarction) [facility]) 05/2015   [facility]  . Obesity   . 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 05/2015 Following PCI (radial access)-->conservatively managed.  . Tobacco abuse   . Tricuspid regurgitation   . Type II diabetes mellitus ([place])    a. Type II  . Vitamin D deficiency     Family History  Problem Relation Age of Onset  . Diabetes Mother   . Healthy Father   . Healthy Sister   . Healthy Brother   . Diabetes Maternal Grandmother   . Heart disease Maternal Grandmother   . Diabetes Maternal Aunt        x 2  . Heart attack Neg  Hx   . Heart failure Neg Hx   . Stroke Neg Hx   . Hyperlipidemia Neg Hx   . Hypertension Neg Hx   . Stomach cancer Neg Hx   . Colon cancer Neg Hx     SOCIAL HISTORY: Social History   Tobacco Use  . Smoking status: Former Smoker    Packs/day: 0.50    Years: 23.00    Pack years: 11.50    Types: Cigarettes    Last attempt to quit: 04/02/2015    Years since quitting: 2.5  . Smokeless tobacco: Never Used  Substance Use Topics  . Alcohol use: No    Alcohol/week: 0.0 standard drinks    No Known Allergies  No current facility-administered medications for this encounter.    Current Outpatient  Medications  Medication Sig Dispense Refill  . acetaminophen (TYLENOL) 500 MG tablet Take 500 mg by mouth every 6 (six) hours as needed for headache (pain).    [name] albuterol (PROVENTIL HFA) 108 (90 Base) MCG/ACT inhaler Inhale 2 puffs into the lungs every 4 (four) hours as needed for wheezing or shortness of breath.     [name] aspirin 81 MG EC tablet Take 1 tablet (81 mg total) by mouth daily. 90 tablet 3  . Cholecalciferol (VITAMIN D) 2000 units tablet Take 1 tablet (2,000 Units total) by mouth daily. 90 tablet 3  . DULoxetine (CYMBALTA) 60 MG capsule Take 1 capsule (60 mg total) by mouth daily. 30 capsule 12  . Empagliflozin-metFORMIN HCl (SYNJARDY) 06-998 MG TABS Take 1 tablet by mouth 2 (two) times daily. 60 tablet 3  . linaclotide (LINZESS) 145 MCG CAPS capsule Take 1 capsule (145 mcg total) by mouth daily before breakfast. 90 capsule 1  . losartan-hydrochlorothiazide (HYZAAR) 100-25 MG tablet Take 1 tablet by mouth daily. 30 tablet 11  . metoprolol tartrate (LOPRESSOR) 50 MG tablet TAKE 1 TABLET BY MOUTH 2 TIMES DAILY. 60 tablet 6  . nitroGLYCERIN (NITROSTAT) 0.4 MG SL tablet PLACE 1 TABLET UNDER THE TONGUE EVERY 5 MINUTES AS NEEDED FOR CHEST PAIN. (Patient taking differently: Place 0.4 mg under the tongue every 5 (five) minutes as needed for chest pain. ) 25 tablet 3  . oxyCODONE (OXY IR/ROXICODONE) 5 MG immediate release tablet Take 5 mg by mouth every 4 (four) hours as needed for breakthrough pain.    . pregabalin (LYRICA) 50 MG capsule Take 1 capsule (50 mg total) by mouth 3 (three) times daily. 90 capsule 5  . promethazine (PHENERGAN) 12.5 MG tablet Take 1 tablet (12.5 mg total) by mouth every 6 (six) hours as needed for nausea or vomiting. 15 tablet 0  . rosuvastatin (CRESTOR) 20 MG tablet Take 1 tablet (20 mg total) by mouth at bedtime. 90 tablet 3  . XARELTO 2.5 MG TABS tablet Take 2.5 mg by mouth 2 (two) times daily.  6  . ondansetron (ZOFRAN) 4 MG tablet Take 1 tablet (4 mg total) by mouth  daily as needed for nausea or vomiting. 20 tablet 0    REVIEW OF SYSTEMS:  [X]  denotes positive finding, [ ]  denotes negative finding Cardiac  Comments:  Chest pain or chest pressure:    Shortness of breath upon exertion:    Short of breath when lying flat:    Irregular heart rhythm:        Vascular    Pain in calf, thigh, or hip brought on by ambulation:    Pain in feet at night that wakes you up from your sleep:  Blood clot in your veins:    Leg swelling:           PHYSICAL EXAM: Vitals:   10/07/17 1750 10/07/17 2026 10/07/17 2136  BP: (!) 141/81 106/82 124/81  Pulse: 96 78 86  Resp: 19 18 18   Temp: 97.9 F (36.6 C) 98.1 F (36.7 C) 98.4 F (36.9 C)  TempSrc: Oral Oral Oral  SpO2: 100% 100% 98%  Weight:   105.7 kg  Height:   5\' 9"  (1.753 m)    GENERAL: The patient is a well-nourished female, in no acute distress. The vital signs are documented above. CARDIOVASCULAR: He has a normal radial and ulnar pulse on the left arm.  On the right she has 2-3+ ulnar and a 1+ radial pulse.  She does have biphasic Doppler flow at the radial artery on the right and also has Doppler flow in her palmar arch.  She does have exquisite tenderness over the entire radial aspect of her forearm.  There may be mild swelling compared to her left but this is not dramatic.  Her hand was reported to be cool at the [facility] the ER but I do not sense any temperature difference currently. PULMONARY: There is good air exchange  MUSCULOSKELETAL: There are no major deformities or cyanosis. NEUROLOGIC: No focal weakness or paresthesias are detected. SKIN: There are no ulcers or rashes noted. PSYCHIATRIC: The patient has a normal affect.  DATA:  Right arm venous duplex at [facility] negative for DVT or any other unusual findings  MEDICAL ISSUES: I discussed this at length with the patient and her family member present.  She does not have any evidence of new ischemia with normal flow into her hand  with normal ulnar and chronically diminished radial flow.  Normal Doppler flow into her palmar arch.  She has markedly diminished sensation in her hand but this is chronic.  Very difficult to assess which is new and old regarding her symptoms.  Seems to be an exacerbation of the difficulty she has been having for 2-1/2 years.  Certainly does not look ischemic.  She does not have any fullness in her upper arm or biceps area and no tenderness here.  She had a negative venous Doppler earlier today.  She is on chronic Xarelto this could possibly be bleeding into her forearm with Lillian Ballester compartment syndrome.  I suspect that this is unlikely but have recommended CT angiogram for further evaluation of her forearm specifically.  She does have mild chronic renal insufficiency.  Her creatinine 2 weeks ago was 1.0.  Today it is 1.38.  I did explain the slight risk of renal worsening with contrast but though this is the only way to determine if she is having some acute difficulty which would require treatment.  Will obtain CT angiogram of her right arm tonight.  Discussed with Dr. Eulis Foster, [place].    Rosetta Posner, MD FACS [facility] Tel [phone] Pager [phone]

## 2017-10-07 NOTE — ED Provider Notes (Addendum)
10:40 PM-patient has been seen by vascular surgery here who requested a right arm CT with contrast to evaluate for causes of pain and paresthesia.  Dr. Donnetta Hutching does not feel that her exam is consistent with acute ischemia.  He would like to be contacted after the CT returns.  US Venous Img Upper Uni Right  Result Date: 10/07/2017 CLINICAL DATA:  Right upper extremity pain for several days EXAM: RIGHT UPPER EXTREMITY VENOUS DOPPLER ULTRASOUND TECHNIQUE: Gray-scale sonography with graded compression, as well as color Doppler and duplex ultrasound were performed to evaluate the upper extremity deep venous system from the level of the subclavian vein and including the jugular, axillary, basilic, radial, ulnar and upper cephalic vein. Spectral Doppler was utilized to evaluate flow at rest and with distal augmentation maneuvers. COMPARISON:  None. FINDINGS: Contralateral Subclavian Vein: Respiratory phasicity is normal and symmetric with the symptomatic side. No evidence of thrombus. Normal compressibility. Internal Jugular Vein: No evidence of thrombus. Normal compressibility, respiratory phasicity and response to augmentation. Subclavian Vein: No evidence of thrombus. Normal compressibility, respiratory phasicity and response to augmentation. Axillary Vein: No evidence of thrombus. Normal compressibility, respiratory phasicity and response to augmentation. Cephalic Vein: No evidence of thrombus. Normal compressibility, respiratory phasicity and response to augmentation. Basilic Vein: No evidence of thrombus. Normal compressibility, respiratory phasicity and response to augmentation. Brachial Veins: No evidence of thrombus. Normal compressibility, respiratory phasicity and response to augmentation. Radial Veins: No evidence of thrombus. Normal compressibility, respiratory phasicity and response to augmentation. Ulnar Veins: No evidence of thrombus. Normal compressibility, respiratory phasicity and response to  augmentation. Venous Reflux:  None visualized. Other Findings:  None visualized. IMPRESSION: No evidence of DVT within the right upper extremity. Electronically Signed   By: Inez Catalina M.D.   On: 10/07/2017 19:57   Ct Extrem Up Entire Arm R W/cm  Result Date: 10/08/2017 CLINICAL DATA:  Right arm pain EXAM: CT OF THE UPPER RIGHT EXTREMITY WITH CONTRAST TECHNIQUE: Multidetector CT imaging of the upper right extremity was performed according to the standard protocol following intravenous contrast administration. COMPARISON:  None. CONTRAST:  168mL OMNIPAQUE IOHEXOL 300 MG/ML  SOLN FINDINGS: Bones/Joint/Cartilage No acute bony abnormality is noted. Ligaments Suboptimally assessed by CT. Muscles and Tendons Visualized musculature is within normal limits. No focal hematoma or mass lesion is noted. Soft tissues The right subclavian artery and axillary artery as well as the brachial artery are within normal limits without focal stenosis. The brachial artery bifurcation is noted although the radial artery is diminutive. It is poorly visualized beyond its origin. Interosseous artery is diminutive. The dominant flow into the right hand is via ulnar artery. No other soft tissue abnormality is noted. IMPRESSION: No bony or soft tissue abnormality is noted. Dominant runoff into the right hand is via the ulnar artery. The interosseous and radial arteries are diminutive and not well appreciated on this exam. Electronically Signed   By: Inez Catalina M.D.   On: 10/08/2017 00:38     Patient Vitals for the past 24 hrs:  BP Temp Temp src Pulse Resp SpO2 Height Weight  10/07/17 2230 (!) 112/91 - - 82 - 100 % - -  10/07/17 2215 127/86 - - 80 - 100 % - -  10/07/17 2136 124/81 98.4 F (36.9 C) Oral 86 18 98 % 5\' 9"  (1.753 m) 105.7 kg  10/07/17 2026 106/82 98.1 F (36.7 C) Oral 78 18 100 % - -  10/07/17 1750 (!) 141/81 97.9 F (36.6 C) Oral 96 19 100 % - -  Clinical Course as of Oct 08 100  Thu Oct 08, 2017  0102 Arm  sling ordered   [EW]    Clinical Course User Index [EW] Daleen Bo, MD    12:48 AM Reevaluation with update and discussion. After initial assessment and treatment, an updated evaluation reveals patient is fairly comfortable although still complains of right arm pain. 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 No known trauma. Right arm is diffusely tender, in the forearm primarily dorsal.  The compartments of the forearm are soft. Strength of right arm: elbow flexion//extension, internal and external rotation are normal. Fingers of right hand are warm and have capillary refill less than 3 seconds.  Patient has mild dysesthesia of the fingers of the right hand.  Patient states she does not want to go out of work but would like a light duty note.  Findings discussed and questions answered. Daleen Bo   Medical Decision Making: Nonspecific right arm pain.  Evaluation with venous and arterial imaging tonight does not indicate acute abnormalities.  Doubt compartment syndrome.  Doubt arterial obstruction.  Doubt fracture.  CRITICAL CARE-no Performed by: Daleen Bo   Nursing Notes Reviewed/ Care Coordinated Applicable Imaging Reviewed Interpretation of Laboratory Data incorporated into ED treatment  The patient appears reasonably screened and/or stabilized for discharge and I doubt any other medical condition or other [facility] requiring further screening, evaluation, or treatment in the ED at this time prior to discharge.  Plan: Home Medications-ibuprofen 3 times daily.; Home Treatments-arm sling, heat, elevation; return here if the recommended treatment, does not improve the symptoms; Recommended follow up-orthopedic hand surgery follow-up for further evaluation and treatment as soon as possible.  Return here if needed for worsening symptoms.  Light duty at work for 1 week.      Daleen Bo, MD 10/08/17 Justin Mend    Daleen Bo, MD 10/08/17  [phone]

## 2017-10-07 NOTE — Progress Notes (Signed)
   Subjective:    Patient ID: Jennifer Cervantes, female    DOB: [birth year], 37 y.o.   MRN: [id number]  HPI Chief Complaint  Patient presents with  . arm    arm pain. pain with lift arm since sunday. swelling with lower part of arm. hurts with moving arm   She is new to me, usually sees my colleague Dorothea Ogle, [place].  Presents with complaints of right arm swelling and pain for the past 4 days. States she "always has right arm pain" but her right hand and forearm hurt more than usual. She also reports having a history of decreased pulse in that arm after having a stent placement.  Denies injury.   She is taking Lyrica and Cymbalta for nerve pain. She started on Cymbalta one week ago. This was prescribed by her neurologist.   States she is taking Xarelto and has been on this for the past 2 months. Her cardologist put her on this. It is not on her medication list.   Denies fever, chills, dizziness, headache, vision changes, chest pain, palpitations, shortness of breath, leg or calf pain.   Reviewed allergies, medications, past medical, surgical, family, and social history.   Review of Systems Pertinent positives and negatives in the history of present illness.     Objective:   Physical Exam  Constitutional: She is oriented to person, place, and time. She appears well-developed and well-nourished. No distress.  HENT:  Mouth/Throat: Oropharynx is clear and moist.  Eyes: Pupils are equal, round, and reactive to light. Conjunctivae and EOM are normal.  Neck: Normal range of motion. Neck supple.  Cardiovascular: Normal rate and regular rhythm.  Pulmonary/Chest: Effort normal and breath sounds normal.  Musculoskeletal:       Right elbow: She exhibits decreased range of motion.  TTP even to light tough to right anterior forearm from elbow to fingertips. Mild edema present, right hand with decreased radial pulse and coolness increased compared to left. Limited ROM and decreased grip  strength.   Neurological: She is alert and oriented to person, place, and time.  Skin: Skin is warm and dry.   BP 110/78   Pulse 80   Temp 98.3 F (36.8 C) (Oral)   Resp 16   Wt 233 lb (105.7 kg)   SpO2 98%   BMI 34.41 kg/m       Assessment & Plan:  Arm edema - Plan: CANCELED: VAS [place] UPPER EXTREMITY VENOUS DUPLEX  Pain in right arm - Plan: CANCELED: VAS [place] UPPER EXTREMITY VENOUS DUPLEX  Decreased radial pulse - Plan: CANCELED: VAS [place] UPPER EXTREMITY VENOUS DUPLEX  Reports cardiologist started her on Xarelto 2 months ago but this is not on her medication list.  Concern for blood clot due to edema, increased pain, decreased radial pulse and coolness of right hand. She has a history of chronic right arm pain but this is apparently a different type of pain than her baseline.  Attempted to get an outpatient doppler but unsuccessful. Will send her to the ED for further evaluation. She is in agreement.

## 2017-10-07 NOTE — Telephone Encounter (Signed)
Per vo by Dr. Krista Blue, if she has visible swelling, she should see her PCP.  Dr. Krista Blue is evaluating her paresthesia.  Swelling can indicate other issues.  She should keep her pending appt for the MRI scans of her cervical and lumbar spine.  Dr. Krista Blue will review results, once available.  She should continue taking Lyrica 50mg  TID and duloxetine 60mg  daily.   Spoke to patient and she is in agreement with this plan.   Jennifer Cervantes  to Marcial Pacas, MD      10/07/17 12:05 PM  My right arm has been swollen since Sunday and it hurt to move

## 2017-10-07 NOTE — Patient Instructions (Signed)
Go to the emergency room to rule out a blood clot in your right arm. I am unable to get this scheduled as outpatient.

## 2017-10-07 NOTE — ED Triage Notes (Signed)
Pt /co right arm pain from elbow down x 3 days. Seen pcp today and advised to come for [place] to rule out clot. No obvious swelling or redness noted. Equal warmth noted to both arms. Nad. Radial pulse not as strong to right as is left. Very tender to touch.

## 2017-10-07 NOTE — ED Provider Notes (Signed)
[facility] EMERGENCY DEPARTMENT Provider Note   CSN: [id number] Arrival date & time: 10/07/17  1744     History   Chief Complaint Chief Complaint  Patient presents with  . Arm Pain    HPI Jennifer Cervantes is a 37 y.o. female.  HPI Patient presents emergency room for evaluation of pain from her elbow down to her hand.  Patient states her hand and arm are tender  to touch.  This started about 3 days ago.  He does not recall any injury.  She has not noticed any redness or swelling.  Any movement or palpation aggravates the pain.  She went to see her primary care doctor today who noticed that her radial pulse is weak and her fingers were cooler on the right side.  She recommended evaluation in the ED. Past Medical History:  Diagnosis Date  . CAD (coronary artery disease)    a. LHC on 06/21/15 which showed 75% occl mid-dist LCx, 40% occl mRCA, 99% mid-dist LAD s/p DES and 50% occl mLAD s/p DES (overlapping stents) and significant LV dysfunction with anteroapical HK; b. 06/2015 Myoview: EF 55-65%, no ischemia/infarct.  . CHF (congestive heart failure) ([place])   . GERD (gastroesophageal reflux disease)   . Hyperlipidemia   . Hypertension   . Hypothyroidism   . Myocardial stunning (HCC)    a. EF 45% on 2D ECHO on 06/21/15 and severe LV dysfunction on LV gram by cath. Repeat 2D ECHO with EF 55-60% and mild HK of the apicoseptal myocardium.  [name] Neuropraxia of right median nerve    a. suspected after right radial cath. will follow with neuro outpatient if does not resolve.   . NSTEMI (non-ST elevated myocardial infarction) [facility]) 05/2015   [facility]  . Obesity   . 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 05/2015 Following PCI (radial access)-->conservatively managed.  . Tobacco abuse   . Tricuspid regurgitation   . Type II diabetes mellitus ([place])    a. Type II  . Vitamin D deficiency     Patient Active Problem List   Diagnosis Date Noted  . Limb pain 09/17/2017  . Essential  hypertension 04/18/2017  . Fatigue 03/27/2017  . Folate deficiency 03/27/2017  . Daytime somnolence 03/27/2017  . Snoring 03/27/2017  . Right leg pain 03/27/2017  . Diabetes mellitus type 2 in obese ([place]) 02/12/2017  . Acute right ankle pain 02/11/2017  . Right sided weakness 10/13/2016  . Gait abnormality 10/13/2016  . Esophageal dysphagia   . Former smoker 07/15/2016  . Abnormal PFT 07/15/2016  . Vitamin D deficiency 07/15/2016  . Gastroesophageal reflux disease 07/15/2016  . Mood change 07/15/2016  . Paresthesia 07/15/2016  . Right arm pain 07/15/2016  . Encounter for health maintenance examination in adult 04/15/2016  . Chronic diastolic heart failure ([place]) 10/22/2015  . Deficiency anemia 10/05/2015  . Dyspnea on exertion 10/05/2015  . Hyperlipidemia   . Diabetes mellitus due to underlying condition with complication, without long-term current use of insulin ([place]) 06/29/2015  . Coronary artery disease involving native coronary artery with angina pectoris ([place])   . Neuropraxia of right median nerve   . Obesity 06/21/2015  . Abnormal thyroid function test 06/21/2015  . History of non-ST elevation myocardial infarction (NSTEMI)     Past Surgical History:  Procedure Laterality Date  . [place] STUDY N/A 07/14/2016   Procedure: [place] STUDY;  Surgeon: Mauri Pole, MD;  [facility];  Service: Endoscopy;  Laterality: N/A;  . CARDIAC CATHETERIZATION N/A 06/21/2015   Procedure: Left Heart Cath and Coronary Angiography;  Surgeon: Belva Crome, MD; pLAD 99%, CFX 75%, RCA 40%, EF 35%   . CARDIAC CATHETERIZATION N/A 06/21/2015   Procedure: Coronary Stent Intervention;  Surgeon: Belva Crome, MD;  Promus Premier DES, 3.0 x 20 and 3.5 x 32 mm stents to the LAD  . CARDIAC CATHETERIZATION N/A 06/21/2015   Procedure: Intravascular Ultrasound/IVUS;  Surgeon: Belva Crome, MD;  Location: [place] CV LAB;  Service: Cardiovascular;  Laterality: N/A;  LAD  . CARDIAC  CATHETERIZATION  06/22/2015   Procedure: Coronary/Graft Angiography;  Surgeon: Peter M Martinique, MD;  Location: [place] CV LAB;  Service: Cardiovascular;;  . CORONARY STENT INTERVENTION N/A 04/02/2016   Procedure: Coronary Stent Intervention;  Surgeon: Jettie Booze, MD;  Location: [place] CV LAB;  Service: Cardiovascular;  Laterality: N/A;  . ESOPHAGEAL MANOMETRY N/A 07/14/2016   Procedure: ESOPHAGEAL MANOMETRY (EM);  Surgeon: Mauri Pole, MD;  [facility];  Service: Endoscopy;  Laterality: N/A;  . LEFT HEART CATH AND CORONARY ANGIOGRAPHY N/A 04/02/2016   Procedure: Left Heart Cath and Coronary Angiography;  Surgeon: Jettie Booze, MD;  Location: [place] CV LAB;  Service: Cardiovascular;  Laterality: N/A;  . LEFT HEART CATH AND CORONARY ANGIOGRAPHY N/A 07/17/2016   Procedure: Left Heart Cath and Coronary Angiography;  Surgeon: Leonie Man, MD;  Location: [place] CV LAB;  Service: Cardiovascular;  Laterality: N/A;     OB History   None      Home Medications    Prior to Admission medications   Medication Sig Start Date End Date Taking? Authorizing Provider  acetaminophen (TYLENOL) 500 MG tablet Take 500 mg by mouth every 6 (six) hours as needed for headache (pain).    [provider]  albuterol (PROVENTIL HFA) 108 (90 Base) MCG/ACT inhaler Inhale 2 puffs into the lungs every 4 (four) hours as needed.    [provider]  aspirin 81 MG EC tablet Take 1 tablet (81 mg total) by mouth daily. 09/18/17   Tysinger, Camelia Eng, PA-C  Cholecalciferol (VITAMIN D) 2000 units tablet Take 1 tablet (2,000 Units total) by mouth daily. 09/18/17   Tysinger, Camelia Eng, PA-C  DULoxetine (CYMBALTA) 60 MG capsule Take 1 capsule (60 mg total) by mouth daily. 09/17/17   Marcial Pacas, MD  Empagliflozin-metFORMIN HCl (SYNJARDY) 06-998 MG TABS Take 1 tablet by mouth 2 (two) times daily. 09/18/17   Tysinger, Camelia Eng, PA-C  linaclotide (LINZESS) 145 MCG CAPS capsule  Take 1 capsule (145 mcg total) by mouth daily before breakfast. Patient taking differently: Take 145 mcg by mouth daily as needed.  09/03/16   Armbruster, Carlota Raspberry, MD  losartan-hydrochlorothiazide (HYZAAR) 100-25 MG tablet Take 1 tablet by mouth daily. 04/18/17   Tanda Rockers, MD  metoprolol tartrate (LOPRESSOR) 50 MG tablet TAKE 1 TABLET BY MOUTH 2 TIMES DAILY. 09/17/17   Herminio Commons, MD  nitroGLYCERIN (NITROSTAT) 0.4 MG SL tablet PLACE 1 TABLET UNDER THE TONGUE EVERY 5 MINUTES AS NEEDED FOR CHEST PAIN. 09/17/17   Herminio Commons, MD  ondansetron (ZOFRAN) 4 MG tablet Take 1 tablet (4 mg total) by mouth daily as needed for nausea or vomiting. 10/07/17 10/07/18  Tysinger, Camelia Eng, PA-C  oxyCODONE (OXY IR/ROXICODONE) 5 MG immediate release tablet Take 5 mg by mouth every 4 (four) hours as needed for breakthrough pain.    [provider]  pregabalin (LYRICA)  50 MG capsule Take 1 capsule (50 mg total) by mouth 3 (three) times daily. 09/17/17   Marcial Pacas, MD  promethazine (PHENERGAN) 12.5 MG tablet Take 1 tablet (12.5 mg total) by mouth every 6 (six) hours as needed for nausea or vomiting. 08/06/16   Tysinger, Camelia Eng, PA-C  rosuvastatin (CRESTOR) 20 MG tablet Take 1 tablet (20 mg total) by mouth at bedtime. 09/18/17 09/18/18  Tysinger, Camelia Eng, PA-C    Family History Family History  Problem Relation Age of Onset  . Diabetes Mother   . Healthy Father   . Healthy Sister   . Healthy Brother   . Diabetes Maternal Grandmother   . Heart disease Maternal Grandmother   . Diabetes Maternal Aunt        x 2  . Heart attack Neg Hx   . Heart failure Neg Hx   . Stroke Neg Hx   . Hyperlipidemia Neg Hx   . Hypertension Neg Hx   . Stomach cancer Neg Hx   . Colon cancer Neg Hx     Social History Social History   Tobacco Use  . Smoking status: Former Smoker    Packs/day: 0.50    Years: 23.00    Pack years: 11.50    Types: Cigarettes    Last attempt to quit: 04/02/2015    Years  since quitting: 2.5  . Smokeless tobacco: Never Used  Substance Use Topics  . Alcohol use: No    Alcohol/week: 0.0 standard drinks  . Drug use: No     Allergies   Patient has no known allergies.   Review of Systems Review of Systems  All other systems reviewed and are negative.    Physical Exam Updated Vital Signs BP 106/82 (BP Location: Left Arm)   Pulse 78   Temp 98.1 F (36.7 C) (Oral)   Resp 18   SpO2 100%   Physical Exam  Constitutional: She appears well-developed and well-nourished. No distress.  HENT:  Head: Normocephalic and atraumatic.  Right Ear: External ear normal.  Left Ear: External ear normal.  Eyes: Conjunctivae are normal. Right eye exhibits no discharge. Left eye exhibits no discharge. No scleral icterus.  Neck: Neck supple. No tracheal deviation present.  Cardiovascular: Normal rate, regular rhythm and normal heart sounds.  Pulmonary/Chest: Effort normal and breath sounds normal. No stridor. No respiratory distress.  Abdominal: She exhibits no distension.  Musculoskeletal: She exhibits no edema.  Weak radial pulse on the right wrist, strong ulnar pulse, sensation intact, hand and fingers are cool on the right side compared to the left, pain with range of motion of the left elbow  Neurological: She is alert. Cranial nerve deficit: no gross deficits.  Skin: Skin is warm and dry. No rash noted.  Psychiatric: She has a normal mood and affect.  Nursing note and vitals reviewed.    ED Treatments / Results  Labs (all labs ordered are listed, but only abnormal results are displayed) Labs Reviewed  CBC  BASIC METABOLIC PANEL    EKG None  Radiology US Venous Img Upper Uni Right  Result Date: 10/07/2017 CLINICAL DATA:  Right upper extremity pain for several days EXAM: RIGHT UPPER EXTREMITY VENOUS DOPPLER ULTRASOUND TECHNIQUE: Gray-scale sonography with graded compression, as well as color Doppler and duplex ultrasound were performed to evaluate  the upper extremity deep venous system from the level of the subclavian vein and including the jugular, axillary, basilic, radial, ulnar and upper cephalic vein. Spectral Doppler was utilized to evaluate  flow at rest and with distal augmentation maneuvers. COMPARISON:  None. FINDINGS: Contralateral Subclavian Vein: Respiratory phasicity is normal and symmetric with the symptomatic side. No evidence of thrombus. Normal compressibility. Internal Jugular Vein: No evidence of thrombus. Normal compressibility, respiratory phasicity and response to augmentation. Subclavian Vein: No evidence of thrombus. Normal compressibility, respiratory phasicity and response to augmentation. Axillary Vein: No evidence of thrombus. Normal compressibility, respiratory phasicity and response to augmentation. Cephalic Vein: No evidence of thrombus. Normal compressibility, respiratory phasicity and response to augmentation. Basilic Vein: No evidence of thrombus. Normal compressibility, respiratory phasicity and response to augmentation. Brachial Veins: No evidence of thrombus. Normal compressibility, respiratory phasicity and response to augmentation. Radial Veins: No evidence of thrombus. Normal compressibility, respiratory phasicity and response to augmentation. Ulnar Veins: No evidence of thrombus. Normal compressibility, respiratory phasicity and response to augmentation. Venous Reflux:  None visualized. Other Findings:  None visualized. IMPRESSION: No evidence of DVT within the right upper extremity. Electronically Signed   By: Inez Catalina M.D.   On: 10/07/2017 19:57    Procedures Procedures (including critical care time)  Medications Ordered in ED Medications - No data to display   Initial Impression / Assessment and Plan / ED Course  I have reviewed the triage vital signs and the nursing notes.  Pertinent labs & imaging results that were available during my care of the patient were reviewed by me and considered in my  medical decision making (see chart for details).   Presents to the emergency room for evaluation of possible DVT after seeing her primary care provider.  Patient had a venous Doppler study ordered at triage based on her primary care provider's concerns.  On my exam however I was concerned about the possibility of an arterial obstruction, ?RSD.  Patient's arm is cool compared to the left.  I am unable to palpate a radial pulse of those presently do palpate an ulnar pulse.  Discussed the case with Dr. early.  He will be happy to see the patient at [facility].  In order to expedite her transfer the patient will transfer by POV.  I also discussed with Dr Eulis Foster.  P  Final Clinical Impressions(s) / ED Diagnoses   Final diagnoses:  Pain  Absent radial pulse     Dorie Rank, MD 10/07/17 2035

## 2017-10-08 DIAGNOSIS — M79621 Pain in right upper arm: Secondary | ICD-10-CM | POA: Diagnosis not present

## 2017-10-08 NOTE — Discharge Instructions (Addendum)
The testing today is reassuring.  We are referring you to a hand surgeon, to be evaluated for problems that they may be able to help.  If your symptoms worsen or you feel that the pain is uncontrollable, return here for evaluation.  You can try taking ibuprofen 400 mg 3 times a day with meals for up to 1 week.  Also, use your oxycodone if needed for severe pain.  Try using heat on the right arm 3 or 4 times a day and use the arm sling as needed for comfort.  It may help to elevate your arm above your heart.

## 2017-10-09 DIAGNOSIS — M79601 Pain in right arm: Secondary | ICD-10-CM | POA: Diagnosis not present

## 2017-10-10 ENCOUNTER — Ambulatory Visit
Admission: RE | Admit: 2017-10-10 | Discharge: 2017-10-10 | Disposition: A | Discharge status: Home / Self Care | Payer: 59 | Source: Ambulatory Visit | Attending: Neurology | Admitting: Neurology

## 2017-10-10 DIAGNOSIS — R531 Weakness: Secondary | ICD-10-CM

## 2017-10-10 DIAGNOSIS — R202 Paresthesia of skin: Secondary | ICD-10-CM

## 2017-10-10 DIAGNOSIS — M79621 Pain in right upper arm: Secondary | ICD-10-CM

## 2017-10-12 ENCOUNTER — Telehealth: Payer: Self-pay | Admitting: Neurology

## 2017-10-12 ENCOUNTER — Other Ambulatory Visit: Payer: Self-pay | Admitting: Orthopedic Surgery

## 2017-10-12 NOTE — Telephone Encounter (Signed)
Left patient a detailed message, with results, on voicemail (ok per DPR).  Provided our number to call back with any questions.  

## 2017-10-12 NOTE — Telephone Encounter (Signed)
Please call patient, MRI cervical and lumbar showed mild degenerative changes but there was no evidence of spinal cord or spinal nerve compression  IMPRESSION:  This MRI of the cervical spine without contrast shows the following: 1.  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  Disc protrusion at C5-C6 that does not cause spinal stenosis or nerve root compression 3.  The spinal cord appears normal.  IMPRESSION: This MRI of the lumbar spine without contrast shows degenerative changes at L4-L5 and L5-S1 that do not lead to any nerve root compression or spinal stenosis.

## 2017-10-15 ENCOUNTER — Other Ambulatory Visit: Payer: Self-pay | Admitting: Orthopedic Surgery

## 2017-10-15 DIAGNOSIS — M79601 Pain in right arm: Secondary | ICD-10-CM

## 2017-11-02 MED FILL — XARELTO 2.5 MG TABS: 2.5 | 30 days supply | Qty: 60 | Fill #2

## 2017-11-02 MED FILL — SYNJARDY 5-1,000 MG TABLET: 5-1000 | 30 days supply | Qty: 60 | Fill #1

## 2017-12-14 MED FILL — SYNJARDY 5-1,000 MG TABLET: 5-1000 | 30 days supply | Qty: 60 | Fill #2

## 2018-01-11 ENCOUNTER — Ambulatory Visit: Payer: 59 | Admitting: Cardiovascular Disease

## 2018-02-03 MED FILL — ROSUVASTATIN CALCIUM 20 MG: 20 | 90 days supply | Qty: 90 | Fill #2

## 2018-02-03 MED FILL — XARELTO 2.5 MG TABS: 2.5 | 30 days supply | Qty: 60 | Fill #3

## 2018-02-26 MED FILL — LOSARTAN-HCTZ 100-25 MG TAB: 100-25 | 90 days supply | Qty: 90 | Fill #0

## 2018-03-04 MED FILL — SYNJARDY 5-1,000 MG TABLET: 5-1000 | 30 days supply | Qty: 60 | Fill #3

## 2018-04-06 ENCOUNTER — Encounter: Payer: Self-pay | Admitting: Cardiovascular Disease

## 2018-04-06 ENCOUNTER — Ambulatory Visit (INDEPENDENT_AMBULATORY_CARE_PROVIDER_SITE_OTHER): Payer: 59 | Admitting: Cardiovascular Disease

## 2018-04-06 VITALS — BP 110/72 | HR 80 | Ht 69.0 in | Wt 226.0 lb

## 2018-04-06 DIAGNOSIS — E785 Hyperlipidemia, unspecified: Secondary | ICD-10-CM | POA: Diagnosis not present

## 2018-04-06 DIAGNOSIS — I70208 Unspecified atherosclerosis of native arteries of extremities, other extremity: Secondary | ICD-10-CM

## 2018-04-06 DIAGNOSIS — I10 Essential (primary) hypertension: Secondary | ICD-10-CM

## 2018-04-06 DIAGNOSIS — I25118 Atherosclerotic heart disease of native coronary artery with other forms of angina pectoris: Secondary | ICD-10-CM | POA: Diagnosis not present

## 2018-04-06 DIAGNOSIS — Z955 Presence of coronary angioplasty implant and graft: Secondary | ICD-10-CM | POA: Diagnosis not present

## 2018-04-06 NOTE — Patient Instructions (Signed)
Your physician wants you to follow-up in: 6 MONTHS You will receive a reminder letter in the mail two months in advance. If you don't receive a letter, please call our office to schedule the follow-up appointment.  Your physician recommends that you continue on your current medications as directed. Please refer to the Current Medication list given to you today.  Thank you for choosing [facility]!!

## 2018-04-06 NOTE — Progress Notes (Signed)
SUBJECTIVE: The patient presents for routine follow-up.  She has chronic intermittent chest pains not deemed cardiac in etiology.  They last seconds and are nonexertional.  She has chronic right intermittent forearm pain and swelling.  She has been evaluated by vascular surgery.  She has a chronically occluded radial artery with adequate blood supply through the ulnar artery. She feels lightheaded if she stands up too quickly.  She tries to drink 3 bottles of water daily.  She does not exercise.  She tries to eat baked foods and vegetables.     Review of Systems: As per "subjective", otherwise negative.  No Known Allergies  Current Outpatient Medications  Medication Sig Dispense Refill  . acetaminophen (TYLENOL) 500 MG tablet Take 500 mg by mouth every 6 (six) hours as needed for headache (pain).    [name] aspirin 81 MG EC tablet Take 1 tablet (81 mg total) by mouth daily. 90 tablet 3  . Cholecalciferol (VITAMIN D) 2000 units tablet Take 1 tablet (2,000 Units total) by mouth daily. 90 tablet 3  . Empagliflozin-metFORMIN HCl (SYNJARDY) 06-998 MG TABS Take 1 tablet by mouth 2 (two) times daily. 60 tablet 3  . linaclotide (LINZESS) 145 MCG CAPS capsule Take 1 capsule (145 mcg total) by mouth daily before breakfast. 90 capsule 1  . metoprolol tartrate (LOPRESSOR) 50 MG tablet TAKE 1 TABLET BY MOUTH 2 TIMES DAILY. 60 tablet 6  . nitroGLYCERIN (NITROSTAT) 0.4 MG SL tablet PLACE 1 TABLET UNDER THE TONGUE EVERY 5 MINUTES AS NEEDED FOR CHEST PAIN. (Patient taking differently: Place 0.4 mg under the tongue every 5 (five) minutes as needed for chest pain. ) 25 tablet 3  . ondansetron (ZOFRAN) 4 MG tablet Take 1 tablet (4 mg total) by mouth daily as needed for nausea or vomiting. 20 tablet 0  . oxyCODONE (OXY IR/ROXICODONE) 5 MG immediate release tablet Take 5 mg by mouth every 4 (four) hours as needed for breakthrough pain.    . promethazine (PHENERGAN) 12.5 MG tablet Take 1 tablet (12.5 mg total) by  mouth every 6 (six) hours as needed for nausea or vomiting. 15 tablet 0  . rosuvastatin (CRESTOR) 20 MG tablet Take 1 tablet (20 mg total) by mouth at bedtime. 90 tablet 3  . XARELTO 2.5 MG TABS tablet Take 2.5 mg by mouth 2 (two) times daily.  6   No current facility-administered medications for this visit.     Past Medical History:  Diagnosis Date  . CAD (coronary artery disease)    a. LHC on 06/21/15 which showed 75% occl mid-dist LCx, 40% occl mRCA, 99% mid-dist LAD s/p DES and 50% occl mLAD s/p DES (overlapping stents) and significant LV dysfunction with anteroapical HK; b. 06/2015 Myoview: EF 55-65%, no ischemia/infarct.  . CHF (congestive heart failure) ([place])   . GERD (gastroesophageal reflux disease)   . Hyperlipidemia   . Hypertension   . Hypothyroidism   . Myocardial stunning (HCC)    a. EF 45% on 2D ECHO on 06/21/15 and severe LV dysfunction on LV gram by cath. Repeat 2D ECHO with EF 55-60% and mild HK of the apicoseptal myocardium.  [name] Neuropraxia of right median nerve    a. suspected after right radial cath. will follow with neuro outpatient if does not resolve.   . NSTEMI (non-ST elevated myocardial infarction) [facility]) 05/2015   [facility]  . Obesity   . 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 05/2015 Following PCI (radial access)-->conservatively managed.  [name]  Tobacco abuse   . Tricuspid regurgitation   . Type II diabetes mellitus ([place])    a. Type II  . Vitamin D deficiency     Past Surgical History:  Procedure Laterality Date  . [place] STUDY N/A 07/14/2016   Procedure: [place] STUDY;  Surgeon: Mauri Pole, MD;  [facility];  Service: Endoscopy;  Laterality: N/A;  . CARDIAC CATHETERIZATION N/A 06/21/2015   Procedure: Left Heart Cath and Coronary Angiography;  Surgeon: Belva Crome, MD; pLAD 99%, CFX 75%, RCA 40%, EF 35%   . CARDIAC CATHETERIZATION N/A 06/21/2015   Procedure: Coronary Stent Intervention;  Surgeon: Belva Crome, MD;  Promus  Premier DES, 3.0 x 20 and 3.5 x 32 mm stents to the LAD  . CARDIAC CATHETERIZATION N/A 06/21/2015   Procedure: Intravascular Ultrasound/IVUS;  Surgeon: Belva Crome, MD;  Location: [place] CV LAB;  Service: Cardiovascular;  Laterality: N/A;  LAD  . CARDIAC CATHETERIZATION  06/22/2015   Procedure: Coronary/Graft Angiography;  Surgeon: Peter M Martinique, MD;  Location: [place] CV LAB;  Service: Cardiovascular;;  . CORONARY STENT INTERVENTION N/A 04/02/2016   Procedure: Coronary Stent Intervention;  Surgeon: Jettie Booze, MD;  Location: [place] CV LAB;  Service: Cardiovascular;  Laterality: N/A;  . ESOPHAGEAL MANOMETRY N/A 07/14/2016   Procedure: ESOPHAGEAL MANOMETRY (EM);  Surgeon: Mauri Pole, MD;  [facility];  Service: Endoscopy;  Laterality: N/A;  . LEFT HEART CATH AND CORONARY ANGIOGRAPHY N/A 04/02/2016   Procedure: Left Heart Cath and Coronary Angiography;  Surgeon: Jettie Booze, MD;  Location: [place] CV LAB;  Service: Cardiovascular;  Laterality: N/A;  . LEFT HEART CATH AND CORONARY ANGIOGRAPHY N/A 07/17/2016   Procedure: Left Heart Cath and Coronary Angiography;  Surgeon: Leonie Man, MD;  Location: [place] CV LAB;  Service: Cardiovascular;  Laterality: N/A;    Social History   Socioeconomic History  . Marital status: Single    Spouse name: Not on file  . Number of children: 4  . Years of education: 84  . Highest education level: Not on file  Occupational History  . Occupation: Forensic psychologist: [employer]  . Financial resource strain: Not on file  . Food insecurity:    Worry: Not on file    Inability: Not on file  . Transportation needs:    Medical: Not on file    Non-medical: Not on file  Tobacco Use  . Smoking status: Current Every Day Smoker    Packs/day: 0.50    Years: 23.00    Pack years: 11.50    Types: Cigarettes    Last attempt to quit: 04/02/2015    Years since quitting: 3.0  . Smokeless tobacco:  Never Used  Substance and Sexual Activity  . Alcohol use: No    Alcohol/week: 0.0 standard drinks  . Drug use: No  . Sexual activity: Not on file  Lifestyle  . Physical activity:    Days per week: Not on file    Minutes per session: Not on file  . Stress: Not on file  Relationships  . Social connections:    Talks on phone: Not on file    Gets together: Not on file    Attends religious service: Not on file    Active member of club or organization: Not on file    Attends meetings of clubs or organizations: Not on file    Relationship status: Not  on file  . Intimate partner violence:    Fear of current or ex partner: Not on file    Emotionally abused: Not on file    Physically abused: Not on file    Forced sexual activity: Not on file  Other Topics Concern  . Not on file  Social History Narrative   Lives in [place], works nights at [employer], [place]   Right-handed   Drinks 1 soda a day   Exercises some with walking   Lives with her 89 yo son   Likes her space, doesn't want a significant other   03/2016     Vitals:   04/06/18 0806  BP: 110/72  Pulse: 80  Weight: 226 lb (102.5 kg)  Height: 5\' 9"  (1.753 m)    Wt Readings from Last 3 Encounters:  04/06/18 226 lb (102.5 kg)  10/07/17 233 lb (105.7 kg)  10/07/17 233 lb (105.7 kg)     PHYSICAL EXAM General: NAD HEENT: Normal. Neck: No JVD, no thyromegaly. Lungs: Clear to auscultation bilaterally with normal respiratory effort. CV: Regular rate and rhythm, normal S1/S2, no S3/S4, no murmur. No pretibial or periankle edema.  No carotid bruit.   Abdomen: Soft, nontender, no distention.  Neurologic: Alert and oriented.  Psych: Normal affect. Skin: Normal. Musculoskeletal: No gross deformities.    ECG: Reviewed above under Subjective   Labs: Lab Results  Component Value Date/Time   K 3.8 10/07/2017 08:38 PM   BUN 18 10/07/2017 08:38 PM   BUN 10 09/17/2017 12:30 PM   CREATININE 1.38 (H) 10/07/2017 08:38 PM    CREATININE 0.72 11/22/2015 08:01 AM   ALT 16 09/17/2017 12:30 PM   TSH 0.475 03/27/2017 01:53 PM   HGB 13.7 10/07/2017 08:38 PM   HGB 12.8 09/17/2017 12:30 PM     Lipids: Lab Results  Component Value Date/Time   LDLCALC 70 09/17/2017 12:30 PM   LDLCALC 134 (H) 02/11/2017 08:54 AM   CHOL 138 09/17/2017 12:30 PM   TRIG 91 09/17/2017 12:30 PM   HDL 50 09/17/2017 12:30 PM       ASSESSMENT AND PLAN: 1. Coronary disease with 3 drug-eluting stents in the LAD: Symptomatically stable. Currently on aspirin, Xarelto, metoprolol, and Crestor.   2. Chronic hypertension: Blood pressure is normal. No changes to therapy.  3. Hyperlipidemia: Continue Crestor 20 mg.  Lipid panel from July 2019 reviewed above with LDL of 70.  4.  Right forearm pain/occluded radial artery: No evidence of DVT by ultrasound on 10/07/2017.  CT on 10/07/2017 showed no bony or soft tissue abnormality with dominant runoff into the right hand via the ulnar artery.   Disposition: Follow up 6 months   Kate Sable, M.D., F.A.C.C.

## 2018-05-12 ENCOUNTER — Other Ambulatory Visit: Payer: Self-pay | Admitting: Medical

## 2018-05-12 MED FILL — SYNJARDY 5-1,000 MG TABLET: 5-1000 | 30 days supply | Qty: 60 | Fill #0

## 2018-05-12 MED FILL — XARELTO 2.5 MG TABS: 2.5 | 30 days supply | Qty: 60 | Fill #4

## 2018-05-12 NOTE — Telephone Encounter (Signed)
Is this ok to refill?  

## 2018-05-12 NOTE — Telephone Encounter (Signed)
Review note

## 2018-05-12 NOTE — Telephone Encounter (Signed)
Send #30 day supply and get in for diabetes med check

## 2018-05-12 NOTE — Telephone Encounter (Signed)
Patient states that she doesn't have insurance yet so she doesn't want to make appointment until insurance is in effect.  30 day RX sent to pharmacy.

## 2018-05-13 ENCOUNTER — Encounter: Payer: Self-pay | Admitting: Medical

## 2018-06-14 ENCOUNTER — Other Ambulatory Visit: Payer: Self-pay | Admitting: Medical

## 2018-06-14 NOTE — Telephone Encounter (Signed)
Is this ok to refill?  

## 2018-06-17 NOTE — Telephone Encounter (Signed)
Left message on voicemail for patient to call back to schedule med check virtual.

## 2018-06-17 NOTE — Telephone Encounter (Signed)
Set up for virtual encounter med check, mainly related to diabetes

## 2018-06-29 ENCOUNTER — Telehealth: Payer: Self-pay | Admitting: Medical

## 2018-06-29 NOTE — Telephone Encounter (Signed)
Ok thanks 

## 2018-06-29 NOTE — Telephone Encounter (Signed)
Jennifer Cervantes I scheduled this pt for diabetes check tomorrow, she is available to come in but wanted to see if you want to do virtual or in office, she has several health issues she says and doesn't know if it's safe for her to come into the office.  She has no symptoms of COVID on the screening I did.  She hasn't been checking her BS daily.  Please advise

## 2018-06-29 NOTE — Telephone Encounter (Signed)
No, lets do virtual

## 2018-06-30 ENCOUNTER — Other Ambulatory Visit: Payer: Self-pay

## 2018-06-30 ENCOUNTER — Encounter: Payer: Self-pay | Admitting: Medical

## 2018-06-30 ENCOUNTER — Telehealth: Payer: Self-pay | Admitting: Medical

## 2018-06-30 ENCOUNTER — Ambulatory Visit (INDEPENDENT_AMBULATORY_CARE_PROVIDER_SITE_OTHER): Payer: 59 | Admitting: Medical

## 2018-06-30 VITALS — Temp 98.9°F | Ht 69.0 in | Wt 213.0 lb

## 2018-06-30 DIAGNOSIS — I10 Essential (primary) hypertension: Secondary | ICD-10-CM | POA: Diagnosis not present

## 2018-06-30 DIAGNOSIS — E1169 Type 2 diabetes mellitus with other specified complication: Secondary | ICD-10-CM

## 2018-06-30 DIAGNOSIS — I252 Old myocardial infarction: Secondary | ICD-10-CM

## 2018-06-30 DIAGNOSIS — S5411XS Injury of median nerve at forearm level, right arm, sequela: Secondary | ICD-10-CM | POA: Diagnosis not present

## 2018-06-30 DIAGNOSIS — R531 Weakness: Secondary | ICD-10-CM

## 2018-06-30 DIAGNOSIS — Z129 Encounter for screening for malignant neoplasm, site unspecified: Secondary | ICD-10-CM

## 2018-06-30 DIAGNOSIS — K219 Gastro-esophageal reflux disease without esophagitis: Secondary | ICD-10-CM

## 2018-06-30 DIAGNOSIS — Z7185 Encounter for immunization safety counseling: Secondary | ICD-10-CM | POA: Insufficient documentation

## 2018-06-30 DIAGNOSIS — R946 Abnormal results of thyroid function studies: Secondary | ICD-10-CM

## 2018-06-30 DIAGNOSIS — I25119 Atherosclerotic heart disease of native coronary artery with unspecified angina pectoris: Secondary | ICD-10-CM

## 2018-06-30 DIAGNOSIS — I5032 Chronic diastolic (congestive) heart failure: Secondary | ICD-10-CM | POA: Diagnosis not present

## 2018-06-30 DIAGNOSIS — Z87891 Personal history of nicotine dependence: Secondary | ICD-10-CM

## 2018-06-30 DIAGNOSIS — Z7189 Other specified counseling: Secondary | ICD-10-CM

## 2018-06-30 DIAGNOSIS — E7849 Other hyperlipidemia: Secondary | ICD-10-CM

## 2018-06-30 DIAGNOSIS — M25512 Pain in left shoulder: Secondary | ICD-10-CM | POA: Insufficient documentation

## 2018-06-30 DIAGNOSIS — E669 Obesity, unspecified: Secondary | ICD-10-CM

## 2018-06-30 DIAGNOSIS — E559 Vitamin D deficiency, unspecified: Secondary | ICD-10-CM

## 2018-06-30 MED ORDER — LOSARTAN POTASSIUM 50 MG PO TABS: 50.0000 mg | ORAL_TABLET | Freq: Every day | ORAL | 3 refills | Status: DC

## 2018-06-30 MED ORDER — EMPAGLIFLOZIN-METFORMIN HCL 5-1000 MG PO TABS: 1.0000 | ORAL_TABLET | Freq: Two times a day (BID) | ORAL | 3 refills | Status: DC

## 2018-06-30 MED ORDER — VITAMIN D 50 MCG (2000 UT) PO TABS: 2000.0000 [IU] | ORAL_TABLET | Freq: Every day | ORAL | 3 refills | Status: DC

## 2018-06-30 MED ORDER — ROSUVASTATIN CALCIUM 20 MG PO TABS: 20.0000 mg | ORAL_TABLET | Freq: Every day | ORAL | 3 refills | Status: DC

## 2018-06-30 MED FILL — SYNJARDY 5-1,000 MG TABLET: 5-1000 | 30 days supply | Qty: 60 | Fill #0

## 2018-06-30 MED FILL — ROSUVASTATIN CALCIUM 20 MG: 20 | 90 days supply | Qty: 90 | Fill #0

## 2018-06-30 MED FILL — LOSARTAN POTASSIUM 50 MG TA: 50 | 30 days supply | Qty: 30 | Fill #0

## 2018-06-30 NOTE — Telephone Encounter (Signed)
Patient notified

## 2018-06-30 NOTE — Telephone Encounter (Signed)
Please call Jennifer Cervantes outpatient pharmacy.  Please refill or update prescription for freestyle libre testing supplies, checks daily. she apparently is out of her patches  Get copy of last Pap smear from Dr. Adah Perl in [place] last eye doctor exam if we don't have one up to date

## 2018-06-30 NOTE — Telephone Encounter (Signed)
Additionally - let her know to add back Losartan tablet daily (without the hydrochlorothiazide fluid pill) for kidney protection being diabetic.  Have her go for labs at work.  Orders are in as future orders in the system

## 2018-06-30 NOTE — Progress Notes (Addendum)
This visit type was conducted due to national recommendations for restrictions regarding the COVID-19 Pandemic (e.g. social distancing) in an effort to limit this patient's exposure and mitigate transmission in our community.  Due to their co-morbid illnesses, this patient is at least at moderate risk for complications without adequate follow up.  This format is felt to be most appropriate for this patient at this time.    Documentation for virtual audio and video telecommunications through Zoom encounter:  The patient was located at home. The provider was located in the office. The patient did consent to this visit and is aware of possible charges through their insurance for this visit.  The other persons participating in this telemedicine service were none. Time spent on call was 20 minutes and in review of previous records >25 minutes total.  This virtual service is not related to other E/M service within previous 7 days.  Subjective: Chief Complaint  Patient presents with  . med check    med check  non checking sugar   Virtual visit today for med check.  She has a history significant for coronary artery disease, history of myocardial infarction, neurapraxia of right median nerve, chronic arm numbness, diabetes, hypertension, hyperlipidemia, prior abnormal thyroid labs, obesity, former smoker.  Diabetes- she was using the freestyle libre but ran out of the patches.  She cannot use a regular encounter due to numbness, chronic problems with her right hand.  She notes some polyuria and polydipsia.  She is no weight changes.  No foot concerns.  Checking her feet.  She currently takes Synjardy 06/998 mg twice daily  Hypertension, coronary artery disease, history of heart attack-compliant with aspirin 81 mg daily, metoprolol 50 mg twice daily, rosuvastatin 20 mg daily, Xarelto 2.5 mg twice daily.  She saw cardiology earlier this year.  Home blood pressure readings range anywhere from normal to  as high as 160/99.  She denies any current chest pain, shortness of breath or edema.  She had been on losartan HCT but stopped this due to frequent urination  Gynecology-Pap smear coming up due.  Sees Dr. 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  She needs med refills to [facility].  She notes some pains in left shoulder.   For about the last week she has had pain in shoulder and upper back, hurts to move the shoulder, but can still lift over head.  No pain holding and object.  Denies specific injury or fall, no swelling, no bruising.  No pain in lateral or front of shoulder.  No other arm, neck or back concern.  ROS as in subjective  Past Medical History:  Diagnosis Date  . CAD (coronary artery disease)    a. LHC on 06/21/15 which showed 75% occl mid-dist LCx, 40% occl mRCA, 99% mid-dist LAD s/p DES and 50% occl mLAD s/p DES (overlapping stents) and significant LV dysfunction with anteroapical HK; b. 06/2015 Myoview: EF 55-65%, no ischemia/infarct.  . CHF (congestive heart failure) ([place])   . GERD (gastroesophageal reflux disease)   . Hyperlipidemia   . Hypertension   . Hypothyroidism   . Myocardial stunning (HCC)    a. EF 45% on 2D ECHO on 06/21/15 and severe LV dysfunction on LV gram by cath. Repeat 2D ECHO with EF 55-60% and mild HK of the  apicoseptal myocardium.  [name] Neuropraxia of right median nerve    a. suspected after right radial cath. will follow with neuro outpatient if does not resolve.   . NSTEMI (non-ST elevated myocardial infarction) [facility]) 05/2015   [facility]  . Obesity   . 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 05/2015 Following PCI (radial access)-->conservatively managed.  . Tobacco abuse   . Tricuspid regurgitation   . Type II diabetes mellitus ([place])    a. Type II  .  Vitamin D deficiency    Past Surgical History:  Procedure Laterality Date  . [place] STUDY N/A 07/14/2016   Procedure: [place] STUDY;  Surgeon: Mauri Pole, MD;  [facility];  Service: Endoscopy;  Laterality: N/A;  . CARDIAC CATHETERIZATION N/A 06/21/2015   Procedure: Left Heart Cath and Coronary Angiography;  Surgeon: Belva Crome, MD; pLAD 99%, CFX 75%, RCA 40%, EF 35%   . CARDIAC CATHETERIZATION N/A 06/21/2015   Procedure: Coronary Stent Intervention;  Surgeon: Belva Crome, MD;  Promus Premier DES, 3.0 x 20 and 3.5 x 32 mm stents to the LAD  . CARDIAC CATHETERIZATION N/A 06/21/2015   Procedure: Intravascular Ultrasound/IVUS;  Surgeon: Belva Crome, MD;  Location: [place] CV LAB;  Service: Cardiovascular;  Laterality: N/A;  LAD  . CARDIAC CATHETERIZATION  06/22/2015   Procedure: Coronary/Graft Angiography;  Surgeon: Peter M Martinique, MD;  Location: [place] CV LAB;  Service: Cardiovascular;;  . CORONARY STENT INTERVENTION N/A 04/02/2016   Procedure: Coronary Stent Intervention;  Surgeon: Jettie Booze, MD;  Location: [place] CV LAB;  Service: Cardiovascular;  Laterality: N/A;  . ESOPHAGEAL MANOMETRY N/A 07/14/2016   Procedure: ESOPHAGEAL MANOMETRY (EM);  Surgeon: Mauri Pole, MD;  [facility];  Service: Endoscopy;  Laterality: N/A;  . LEFT HEART CATH AND CORONARY ANGIOGRAPHY N/A 04/02/2016   Procedure: Left Heart Cath and Coronary Angiography;  Surgeon: Jettie Booze, MD;  Location: [place] CV LAB;  Service: Cardiovascular;  Laterality: N/A;  . LEFT HEART CATH AND CORONARY ANGIOGRAPHY N/A 07/17/2016   Procedure: Left Heart Cath and Coronary Angiography;  Surgeon: Leonie Man, MD;  Location: [place] CV LAB;  Service: Cardiovascular;  Laterality: N/A;   Objective: Temp 98.9 F (37.2 C) (Oral)   Ht 5\' 9"  (1.753 m)   Wt 213 lb (96.6 kg)   BMI 31.45 kg/m   Gen: nad No obvious bruising or erythema Left shoulder ROM  seems full Not examined in person due to coronavirus stay at home measures.    Assessment: Encounter Diagnoses  Name Primary?  . Essential hypertension Yes  . Coronary artery disease involving native coronary artery of native heart with angina pectoris ([place])   . Chronic diastolic heart failure ([place])   . Gastroesophageal reflux disease, esophagitis presence not specified   . Diabetes mellitus type 2 in obese ([place])   . Neurapraxia of right median nerve, sequela   . Right sided weakness   . Abnormal thyroid function test   . Former smoker   . Other hyperlipidemia   . Vitamin D deficiency   . History of non-ST elevation myocardial infarction (NSTEMI)   . Screening for cancer   . Vaccine counseling   . Acute pain of left shoulder       Plan: Reviewed medications, chart record, compliance.   She works anything hospital and will try to have her lab draw there.  I put orders in his future orders  Prior abnormal thyroid labs-recheck labs   Diabetes-we will check with pharmacy about updating her freestyle Elenor Legato supplies so she can do glucometer testing at home.  Discussed importance of daily foot checks, compliance to medications, healthy diet and exercise.  Advise yearly eye doctor and dentist follow-up  Vitamin D deficiency-continue vitamin D supplement  Hyperlipidemia-continue statin  Advised mammogram age 66, she will see gynecology soon to update Pap smear  I reviewed her cardiology notes from 04/06/18.  She has a history of heart failure, history of heart attack, coronary artery disease, hypertension.  She continues on Xarelto, aspirin, statin, beta-blocker  She has chronic problems with her right arm due to neuropraxia  Vaccine counseling- I recommend updated pneumococcal 23 vaccine.  She will check with health at work to see if she can get it there.  We want to limit her exposure currently given she is high risk if she were to be infected with a coronavirus  I discussed  coronavirus prevention in general given that she is still working 2 days a week and still doing her own shopping for groceries.  I recommend she try to get someone else to shop for her and limit her exposure to other people.  Discussed use of mask and gloves.   Left shoulder pain-discussed limitations of virtual visit.  Based on her symptoms it sounds like it could be musculoskeletal strain.  Advised heat, Tylenol, stretching.  If not improving the next week then call back for virtual visit recheck  Jennifer Cervantes was seen today for med check.  Diagnoses and all orders for this visit:  Essential hypertension -     Cancel: Comprehensive metabolic panel -     Cancel: Lipid panel -     Comprehensive metabolic panel; Future  Coronary artery disease involving native coronary artery of native heart with angina pectoris (HCC) -     Cancel: Lipid panel -     Comprehensive metabolic panel; Future -     Lipid panel; Future  Chronic diastolic heart failure (HCC)  Gastroesophageal reflux disease, esophagitis presence not specified  Diabetes mellitus type 2 in obese (HCC) -     Cancel: Hemoglobin A1c -     Hemoglobin A1c; Future -     HM Diabetes Foot Exam -     HM Diabetes Eye Exam  Neurapraxia of right median nerve, sequela  Right sided weakness  Abnormal thyroid function test -     Cancel: TSH -     TSH; Future  Former smoker  Other hyperlipidemia -     Lipid panel; Future  Vitamin D deficiency -     Cancel: VITAMIN D 25 Hydroxy (Vit-D Deficiency, Fractures)  History of non-ST elevation myocardial infarction (NSTEMI)  Screening for cancer  Vaccine counseling  Acute pain of left shoulder  Other orders -     Empagliflozin-metFORMIN HCl (SYNJARDY) 06-998 MG TABS; Take 1 tablet by mouth 2 (two) times daily. -     rosuvastatin (CRESTOR) 20 MG tablet; Take 1 tablet (20 mg total) by mouth at bedtime. -     Cholecalciferol (VITAMIN D) 50 MCG (2000 UT) tablet; Take 1 tablet (2,000 Units  total) by mouth daily. -     losartan (COZAAR) 50 MG tablet; Take 1 tablet (50 mg total) by mouth daily.

## 2018-07-05 ENCOUNTER — Other Ambulatory Visit: Payer: Self-pay

## 2018-07-05 ENCOUNTER — Other Ambulatory Visit (HOSPITAL_COMMUNITY)
Admission: RE | Admit: 2018-07-05 | Discharge: 2018-07-05 | Disposition: A | Discharge status: Home / Self Care | Payer: 59 | Source: Ambulatory Visit | Attending: Medical | Admitting: Medical

## 2018-07-05 DIAGNOSIS — I25119 Atherosclerotic heart disease of native coronary artery with unspecified angina pectoris: Secondary | ICD-10-CM | POA: Insufficient documentation

## 2018-07-05 LAB — HEMOGLOBIN A1C
Hgb A1c MFr Bld: 6.3 % — ABNORMAL HIGH (ref 4.8–5.6)
Mean Plasma Glucose: 134.11 mg/dL

## 2018-07-05 LAB — COMPREHENSIVE METABOLIC PANEL
ALT: 18 U/L (ref 0–44)
AST: 18 U/L (ref 15–41)
Albumin: 4.4 g/dL (ref 3.5–5.0)
Alkaline Phosphatase: 67 U/L (ref 38–126)
Anion gap: 11 (ref 5–15)
BUN: 17 mg/dL (ref 6–20)
CO2: 23 mmol/L (ref 22–32)
Calcium: 9.6 mg/dL (ref 8.9–10.3)
Chloride: 108 mmol/L (ref 98–111)
Creatinine, Ser: 1 mg/dL (ref 0.44–1.00)
GFR calc Af Amer: >60 mL/min (ref >60)
GFR calc non Af Amer: >60 mL/min (ref >60)
Glucose, Bld: 131 mg/dL — ABNORMAL HIGH (ref 70–99)
Potassium: 3.5 mmol/L (ref 3.5–5.1)
Sodium: 142 mmol/L (ref 135–145)
Total Bilirubin: 0.5 mg/dL (ref 0.3–1.2)
Total Protein: 8.6 g/dL — ABNORMAL HIGH (ref 6.5–8.1)

## 2018-07-05 LAB — LIPID PANEL
Cholesterol: 232 mg/dL — ABNORMAL HIGH (ref 0–200)
HDL: 52 mg/dL (ref >40)
LDL Cholesterol: 159 mg/dL — ABNORMAL HIGH (ref 0–99)
Total CHOL/HDL Ratio: 4.5 RATIO
Triglycerides: 107 mg/dL (ref <150)
VLDL: 21 mg/dL (ref 0–40)

## 2018-07-05 LAB — TSH: TSH: 0.586 u[IU]/mL (ref 0.350–4.500)

## 2018-07-06 ENCOUNTER — Other Ambulatory Visit: Payer: Self-pay | Admitting: Medical

## 2018-07-06 MED ORDER — ROSUVASTATIN CALCIUM 40 MG PO TABS: 40.0000 mg | ORAL_TABLET | Freq: Every day | ORAL | 0 refills | Status: DC

## 2018-07-06 MED FILL — ROSUVASTATIN CALCIUM 40 MG: 40 | 90 days supply | Qty: 90 | Fill #0

## 2018-07-06 MED FILL — FREESTYLE LITE TEST STRIP: 90 days supply | Qty: 100 | Fill #0

## 2018-07-10 ENCOUNTER — Telehealth: Payer: Self-pay | Admitting: Medical

## 2018-07-10 NOTE — Telephone Encounter (Signed)
P.A. Bradley

## 2018-07-16 NOTE — Telephone Encounter (Signed)
P.A. Isabelle Course, submitted appeal

## 2018-07-21 ENCOUNTER — Other Ambulatory Visit: Payer: Self-pay | Admitting: Medical

## 2018-07-21 MED FILL — FREESTYLE LIBRE 14 DAY SENS: 28 days supply | Qty: 2 | Fill #0

## 2018-07-21 NOTE — Telephone Encounter (Signed)
Is this ok to refill?  

## 2018-07-30 ENCOUNTER — Other Ambulatory Visit: Payer: Self-pay | Admitting: Cardiovascular Disease

## 2018-07-31 MED FILL — LOSARTAN POTASSIUM 50 MG TA: 50 | 30 days supply | Qty: 30 | Fill #1

## 2018-08-04 MED FILL — SYNJARDY 5-1,000 MG TABLET: 5-1000 | 30 days supply | Qty: 60 | Fill #0

## 2018-08-04 MED FILL — XARELTO 2.5 MG TABS: 2.5 | 30 days supply | Qty: 60 | Fill #0

## 2018-08-07 NOTE — Telephone Encounter (Signed)
Appeal approved til 5/24/2, pt informed, pharmacy informed

## 2018-10-22 MED FILL — XARELTO 2.5 MG TABS: 2.5 | 30 days supply | Qty: 60 | Fill #1

## 2018-10-22 MED FILL — LOSARTAN POTASSIUM 50 MG TA: 50 | 30 days supply | Qty: 30 | Fill #0

## 2018-12-09 ENCOUNTER — Other Ambulatory Visit: Payer: Self-pay | Admitting: Medical

## 2018-12-09 MED FILL — ROSUVASTATIN CALCIUM 40 MG: 40 | 30 days supply | Qty: 30 | Fill #0

## 2018-12-09 MED FILL — SYNJARDY 5-1,000 MG TABLET: 5-1000 | 30 days supply | Qty: 60 | Fill #1

## 2018-12-09 MED FILL — LOSARTAN POTASSIUM 50 MG TA: 50 | 30 days supply | Qty: 30 | Fill #1

## 2019-01-07 ENCOUNTER — Other Ambulatory Visit: Payer: Self-pay | Admitting: Medical

## 2019-01-07 MED FILL — ROSUVASTATIN CALCIUM 40 MG: 40 | 30 days supply | Qty: 30 | Fill #0

## 2019-01-18 MED FILL — LOSARTAN POTASSIUM 50 MG TA: 50 | 30 days supply | Qty: 30 | Fill #2

## 2019-03-03 MED FILL — ROSUVASTATIN CALCIUM 40 MG: 40 | 30 days supply | Qty: 30 | Fill #0

## 2019-03-04 MED FILL — LOSARTAN POTASSIUM 50 MG TA: 50 | 30 days supply | Qty: 30 | Fill #3

## 2019-03-04 MED FILL — XARELTO 2.5 MG TABS: 2.5 | 30 days supply | Qty: 60 | Fill #2

## 2019-03-04 MED FILL — SYNJARDY 5-1,000 MG TABLET: 5-1000 | 30 days supply | Qty: 60 | Fill #2

## 2019-04-11 MED FILL — LOSARTAN POTASSIUM 50 MG TA: 50 | 30 days supply | Qty: 30 | Fill #4

## 2019-04-11 MED FILL — ROSUVASTATIN CALCIUM 40 MG: 40 | 30 days supply | Qty: 30 | Fill #1

## 2019-04-20 ENCOUNTER — Telehealth: Payer: Self-pay | Admitting: Medical

## 2019-04-20 NOTE — Telephone Encounter (Signed)
Dismissal letter in guarantor snapshot  °

## 2019-04-25 LAB — HM PAP SMEAR: HM Pap smear: NEGATIVE

## 2019-05-04 DIAGNOSIS — Z01419 Encounter for gynecological examination (general) (routine) without abnormal findings: Secondary | ICD-10-CM | POA: Diagnosis not present

## 2019-05-04 DIAGNOSIS — Z6834 Body mass index (BMI) 34.0-34.9, adult: Secondary | ICD-10-CM | POA: Diagnosis not present

## 2019-06-03 ENCOUNTER — Ambulatory Visit: Payer: 59 | Admitting: Cardiovascular Disease

## 2019-06-07 ENCOUNTER — Encounter: Payer: Self-pay | Admitting: Cardiovascular Disease

## 2019-06-07 ENCOUNTER — Encounter: Payer: Self-pay | Admitting: *Deleted

## 2019-06-07 ENCOUNTER — Ambulatory Visit (INDEPENDENT_AMBULATORY_CARE_PROVIDER_SITE_OTHER): Payer: 59 | Admitting: Cardiovascular Disease

## 2019-06-07 ENCOUNTER — Other Ambulatory Visit: Payer: Self-pay

## 2019-06-07 VITALS — BP 124/84 | HR 81 | Ht 69.0 in | Wt 229.0 lb

## 2019-06-07 DIAGNOSIS — E785 Hyperlipidemia, unspecified: Secondary | ICD-10-CM

## 2019-06-07 DIAGNOSIS — R079 Chest pain, unspecified: Secondary | ICD-10-CM | POA: Diagnosis not present

## 2019-06-07 DIAGNOSIS — I70208 Unspecified atherosclerosis of native arteries of extremities, other extremity: Secondary | ICD-10-CM | POA: Diagnosis not present

## 2019-06-07 DIAGNOSIS — I25118 Atherosclerotic heart disease of native coronary artery with other forms of angina pectoris: Secondary | ICD-10-CM

## 2019-06-07 DIAGNOSIS — R072 Precordial pain: Secondary | ICD-10-CM | POA: Diagnosis not present

## 2019-06-07 DIAGNOSIS — Z955 Presence of coronary angioplasty implant and graft: Secondary | ICD-10-CM | POA: Diagnosis not present

## 2019-06-07 DIAGNOSIS — I10 Essential (primary) hypertension: Secondary | ICD-10-CM

## 2019-06-07 MED ORDER — METOPROLOL TARTRATE 25 MG PO TABS: 25.0000 mg | ORAL_TABLET | Freq: Two times a day (BID) | ORAL | 6 refills | Status: DC

## 2019-06-07 MED FILL — METOPROLOL TARTRATE 25 MG T: 25 | 30 days supply | Qty: 60 | Fill #0

## 2019-06-07 NOTE — Progress Notes (Signed)
SUBJECTIVE: The patient presents for routine follow-up.  She has chronic intermittent chest pains not deemed cardiac in etiology.  They last seconds and are nonexertional.  She has chronic right intermittent forearm pain and swelling.  She has been evaluated by vascular surgery.  She has a chronically occluded radial artery with adequate blood supply through the ulnar artery.  She has coronary artery disease and has 3 drug-eluting stents to the LAD.  She underwent a normal nuclear stress test on 07/24/2015, EF 55 to 65%.  She underwent an echocardiogram on 05/14/2017 which demonstrated normal LV systolic function, normal diastolic function, and normal regional wall motion, LVEF 60-65%.  There was trivial aortic regurgitation.  ECG performed in the office today which I ordered and personally interpreted demonstrates normal sinus rhythm with no ischemic ST segment or T-wave abnormalities, nor any arrhythmias.  She is no longer on a beta-blocker for unclear reasons.  She has been having intermittent left inframammary chest pains which can occur both at rest and with exertion.  She said they are different from her chronic pains.  She said she passed out due to chest pain about a month ago.  She has chronic exertional dyspnea which is stable.  She used to smoke half pack of cigarettes daily for 20 years but quit at the time of her MI.     Review of Systems: As per "subjective", otherwise negative.  No Known Allergies  Current Outpatient Medications  Medication Sig Dispense Refill  . acetaminophen (TYLENOL) 500 MG tablet Take 500 mg by mouth every 6 (six) hours as needed for headache (pain).    [name] aspirin 81 MG EC tablet Take 1 tablet (81 mg total) by mouth daily. 90 tablet 3  . Cholecalciferol (VITAMIN D) 50 MCG (2000 UT) tablet Take 1 tablet (2,000 Units total) by mouth daily. 90 tablet 3  . Empagliflozin-metFORMIN HCl (SYNJARDY) 06-998 MG TABS Take 1 tablet by mouth 2 (two) times  daily. 180 tablet 3  . linaclotide (LINZESS) 145 MCG CAPS capsule Take 1 capsule (145 mcg total) by mouth daily before breakfast. 90 capsule 1  . losartan (COZAAR) 50 MG tablet Take 1 tablet (50 mg total) by mouth daily. 90 tablet 3  . nitroGLYCERIN (NITROSTAT) 0.4 MG SL tablet PLACE 1 TABLET UNDER THE TONGUE EVERY 5 MINUTES AS NEEDED FOR CHEST PAIN. (Patient taking differently: Place 0.4 mg under the tongue every 5 (five) minutes as needed for chest pain. ) 25 tablet 3  . oxyCODONE (OXY IR/ROXICODONE) 5 MG immediate release tablet Take 5 mg by mouth every 4 (four) hours as needed for breakthrough pain.    . promethazine (PHENERGAN) 12.5 MG tablet Take 1 tablet (12.5 mg total) by mouth every 6 (six) hours as needed for nausea or vomiting. 15 tablet 0  . rosuvastatin (CRESTOR) 40 MG tablet TAKE 1 TABLET BY MOUTH DAILY. 30 tablet 2  . XARELTO 2.5 MG TABS tablet TAKE 1 TABLET BY MOUTH 2 TIMES DAILY. 60 tablet 6  . Continuous Blood Gluc Sensor (FREESTYLE LIBRE 14 DAY SENSOR) MISC USE AS DIRECTED 2 each 6   No current facility-administered medications for this visit.    Past Medical History:  Diagnosis Date  . CAD (coronary artery disease)    a. LHC on 06/21/15 which showed 75% occl mid-dist LCx, 40% occl mRCA, 99% mid-dist LAD s/p DES and 50% occl mLAD s/p DES (overlapping stents) and significant LV dysfunction with anteroapical HK; b. 06/2015 Myoview: EF 55-65%, no  ischemia/infarct.  . CHF (congestive heart failure) ([place])   . GERD (gastroesophageal reflux disease)   . Hyperlipidemia   . Hypertension   . Hypothyroidism   . Myocardial stunning    a. EF 45% on 2D ECHO on 06/21/15 and severe LV dysfunction on LV gram by cath. Repeat 2D ECHO with EF 55-60% and mild HK of the apicoseptal myocardium.  [name] Neuropraxia of right median nerve    a. suspected after right radial cath. will follow with neuro outpatient if does not resolve.   . NSTEMI (non-ST elevated myocardial infarction) [facility]) 05/2015   [facility]  . Obesity   . 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 05/2015 Following PCI (radial access)-->conservatively managed.  . Tobacco abuse   . Tricuspid regurgitation   . Type II diabetes mellitus ([place])    a. Type II  . Vitamin D deficiency     Past Surgical History:  Procedure Laterality Date  . [place] STUDY N/A 07/14/2016   Procedure: [place] STUDY;  Surgeon: Mauri Pole, MD;  [facility];  Service: Endoscopy;  Laterality: N/A;  . CARDIAC CATHETERIZATION N/A 06/21/2015   Procedure: Left Heart Cath and Coronary Angiography;  Surgeon: Belva Crome, MD; pLAD 99%, CFX 75%, RCA 40%, EF 35%   . CARDIAC CATHETERIZATION N/A 06/21/2015   Procedure: Coronary Stent Intervention;  Surgeon: Belva Crome, MD;  Promus Premier DES, 3.0 x 20 and 3.5 x 32 mm stents to the LAD  . CARDIAC CATHETERIZATION N/A 06/21/2015   Procedure: Intravascular Ultrasound/IVUS;  Surgeon: Belva Crome, MD;  Location: [place] CV LAB;  Service: Cardiovascular;  Laterality: N/A;  LAD  . CARDIAC CATHETERIZATION  06/22/2015   Procedure: Coronary/Graft Angiography;  Surgeon: Peter M Martinique, MD;  Location: [place] CV LAB;  Service: Cardiovascular;;  . CORONARY STENT INTERVENTION N/A 04/02/2016   Procedure: Coronary Stent Intervention;  Surgeon: Jettie Booze, MD;  Location: [place] CV LAB;  Service: Cardiovascular;  Laterality: N/A;  . ESOPHAGEAL MANOMETRY N/A 07/14/2016   Procedure: ESOPHAGEAL MANOMETRY (EM);  Surgeon: Mauri Pole, MD;  [facility];  Service: Endoscopy;  Laterality: N/A;  . LEFT HEART CATH AND CORONARY ANGIOGRAPHY N/A 04/02/2016   Procedure: Left Heart Cath and Coronary Angiography;  Surgeon: Jettie Booze, MD;  Location: [place] CV LAB;  Service: Cardiovascular;  Laterality: N/A;  . LEFT HEART CATH AND CORONARY ANGIOGRAPHY N/A 07/17/2016   Procedure: Left Heart Cath and Coronary Angiography;  Surgeon: Leonie Man, MD;  Location: [place] CV LAB;  Service: Cardiovascular;  Laterality: N/A;    Social History   Socioeconomic History  . Marital status: Single    Spouse name: Not on file  . Number of children: 4  . Years of education: 9  . Highest education level: Not on file  Occupational History  . Occupation: Forensic psychologist: [employer]  Tobacco Use  . Smoking status: Current Every Day Smoker    Packs/day: 0.50    Years: 23.00    Pack years: 11.50    Types: Cigarettes    Last attempt to quit: 04/02/2015    Years since quitting: 4.1  . Smokeless tobacco: Never Used  Substance and Sexual Activity  . Alcohol use: No    Alcohol/week: 0.0 standard drinks  . Drug use: No  . Sexual activity: Not on file  Other Topics Concern  . Not on file  Social History  Narrative   Lives in [place], works nights at [employer], [place]   Right-handed   Drinks 1 soda a day   Exercises some with walking   Lives with her 36 yo son   Likes her space, doesn't want a significant other   03/2016   Social Determinants of Health   Financial Resource Strain:   . Difficulty of Paying Living Expenses:   Food Insecurity:   . Worried About Charity fundraiser in the Last Year:   . Arboriculturist in the Last Year:   Transportation Needs:   . Film/video editor (Medical):   [name] Lack of Transportation (Non-Medical):   Physical Activity:   . Days of Exercise per Week:   . Minutes of Exercise per Session:   Stress:   . Feeling of Stress :   Social Connections:   . Frequency of Communication with Friends and Family:   . Frequency of Social Gatherings with Friends and Family:   . Attends Religious Services:   . Active Member of Clubs or Organizations:   . Attends Archivist Meetings:   [name] Marital Status:   Intimate Partner Violence:   . Fear of Current or Ex-Partner:   . Emotionally Abused:   [name] Physically Abused:   . Sexually Abused:     Orson Slick, LPN was present throughout the entirety of the  encounter.  Vitals:   06/07/19 1443  BP: 124/84  Pulse: 81  SpO2: 92%  Weight: 229 lb (103.9 kg)  Height: 5\' 9"  (1.753 m)    Wt Readings from Last 3 Encounters:  06/07/19 229 lb (103.9 kg)  06/30/18 213 lb (96.6 kg)  04/06/18 226 lb (102.5 kg)     PHYSICAL EXAM General: NAD HEENT: Normal. Neck: No JVD, no thyromegaly. Lungs: Clear to auscultation bilaterally with normal respiratory effort. CV: Regular rate and rhythm, normal S1/S2, no S3/S4, no murmur. No pretibial or periankle edema.  No carotid bruit.   Abdomen: Soft, nontender, no distention.  Neurologic: Alert and oriented.  Psych: Normal affect. Skin: Normal. Musculoskeletal: No gross deformities.      Labs: Lab Results  Component Value Date/Time   K 3.5 07/05/2018 08:14 AM   BUN 17 07/05/2018 08:14 AM   BUN 10 09/17/2017 12:30 PM   CREATININE 1.00 07/05/2018 08:14 AM   CREATININE 0.72 11/22/2015 08:01 AM   ALT 18 07/05/2018 08:14 AM   TSH 0.586 07/05/2018 08:15 AM   TSH 0.475 03/27/2017 01:53 PM   HGB 13.7 10/07/2017 08:38 PM   HGB 12.8 09/17/2017 12:30 PM     Lipids: Lab Results  Component Value Date/Time   LDLCALC 159 (H) 07/05/2018 08:14 AM   LDLCALC 70 09/17/2017 12:30 PM   LDLCALC 134 (H) 02/11/2017 08:54 AM   CHOL 232 (H) 07/05/2018 08:14 AM   CHOL 138 09/17/2017 12:30 PM   TRIG 107 07/05/2018 08:14 AM   HDL 52 07/05/2018 08:14 AM   HDL 50 09/17/2017 12:30 PM       ASSESSMENT AND PLAN:  1.  Coronary artery disease: History of 3 drug-eluting stents to the LAD.  She has been having episodic precordial pains.  I will obtain a Lexiscan Myoview.  Continue aspirin, Xarelto 2.5 mg twice daily, and rosuvastatin.  No longer on metoprolol.  I will resume metoprolol tartrate at 25 mg twice daily.  2.  Hypertension: Blood pressure is normal.  I will monitor given addition of metoprolol.  3.  Hyperlipidemia: LDL markedly elevated  on 07/05/2018.  I will repeat lipids.  Currently on rosuvastatin 40  mg.  4.  Right forearm pain/occluded radial artery: No evidence of DVT by ultrasound on 10/07/2017.  CT on 10/07/2017 showed no bony or soft tissue abnormality with dominant runoff into the right hand via the ulnar artery.  I recommended she follow-up with vascular surgery as she has been having some more right forearm pain and swelling.    Disposition: Follow up 3 months virtual visit   Kate Sable, M.D., F.A.C.C.

## 2019-06-07 NOTE — Patient Instructions (Addendum)
Medication Instructions:   Lopressor 25mg  twice a day.  Continue all other medications.    Labwork:  Lipids - order given today.   Reminder:  Nothing to eat or drink after 12 midnight prior to labs.  Testing/Procedures: Your physician has requested that you have a lexiscan myoview. For further information please visit [email or web address]. Please follow instruction sheet, as given.  Follow-Up: 3 month   Any Other Special Instructions Will Be Listed Below (If Applicable).  If you need a refill on your cardiac medications before your next appointment, please call your pharmacy.

## 2019-06-08 ENCOUNTER — Telehealth: Payer: Self-pay | Admitting: Cardiovascular Disease

## 2019-06-08 NOTE — Telephone Encounter (Signed)
Pre-cert Verification for the following procedure    LEXISCAN MYOVIEW    DATE:   06/16/2019  LOCATION:  [facility]

## 2019-06-13 ENCOUNTER — Ambulatory Visit (INDEPENDENT_AMBULATORY_CARE_PROVIDER_SITE_OTHER): Payer: 59 | Admitting: Medical

## 2019-06-13 ENCOUNTER — Encounter: Payer: Self-pay | Admitting: Medical

## 2019-06-13 ENCOUNTER — Other Ambulatory Visit: Payer: Self-pay

## 2019-06-13 VITALS — BP 134/76 | HR 80 | Temp 98.2°F | Ht 69.0 in | Wt 229.0 lb

## 2019-06-13 DIAGNOSIS — Z113 Encounter for screening for infections with a predominantly sexual mode of transmission: Secondary | ICD-10-CM

## 2019-06-13 DIAGNOSIS — E559 Vitamin D deficiency, unspecified: Secondary | ICD-10-CM

## 2019-06-13 DIAGNOSIS — Z2820 Immunization not carried out because of patient decision for unspecified reason: Secondary | ICD-10-CM

## 2019-06-13 DIAGNOSIS — K219 Gastro-esophageal reflux disease without esophagitis: Secondary | ICD-10-CM

## 2019-06-13 DIAGNOSIS — I252 Old myocardial infarction: Secondary | ICD-10-CM

## 2019-06-13 DIAGNOSIS — Z7185 Encounter for immunization safety counseling: Secondary | ICD-10-CM

## 2019-06-13 DIAGNOSIS — Z0000 Encounter for general adult medical examination without abnormal findings: Secondary | ICD-10-CM

## 2019-06-13 DIAGNOSIS — E1169 Type 2 diabetes mellitus with other specified complication: Secondary | ICD-10-CM | POA: Diagnosis not present

## 2019-06-13 DIAGNOSIS — I25119 Atherosclerotic heart disease of native coronary artery with unspecified angina pectoris: Secondary | ICD-10-CM

## 2019-06-13 DIAGNOSIS — E7849 Other hyperlipidemia: Secondary | ICD-10-CM | POA: Diagnosis not present

## 2019-06-13 DIAGNOSIS — I5032 Chronic diastolic (congestive) heart failure: Secondary | ICD-10-CM

## 2019-06-13 DIAGNOSIS — Z87891 Personal history of nicotine dependence: Secondary | ICD-10-CM

## 2019-06-13 DIAGNOSIS — E119 Type 2 diabetes mellitus without complications: Secondary | ICD-10-CM

## 2019-06-13 DIAGNOSIS — Z129 Encounter for screening for malignant neoplasm, site unspecified: Secondary | ICD-10-CM

## 2019-06-13 DIAGNOSIS — E669 Obesity, unspecified: Secondary | ICD-10-CM | POA: Diagnosis not present

## 2019-06-13 DIAGNOSIS — R6889 Other general symptoms and signs: Secondary | ICD-10-CM

## 2019-06-13 DIAGNOSIS — I10 Essential (primary) hypertension: Secondary | ICD-10-CM

## 2019-06-13 DIAGNOSIS — R202 Paresthesia of skin: Secondary | ICD-10-CM

## 2019-06-13 DIAGNOSIS — D539 Nutritional anemia, unspecified: Secondary | ICD-10-CM | POA: Diagnosis not present

## 2019-06-13 DIAGNOSIS — R531 Weakness: Secondary | ICD-10-CM

## 2019-06-13 DIAGNOSIS — R946 Abnormal results of thyroid function studies: Secondary | ICD-10-CM

## 2019-06-13 DIAGNOSIS — R0683 Snoring: Secondary | ICD-10-CM

## 2019-06-13 DIAGNOSIS — M79601 Pain in right arm: Secondary | ICD-10-CM

## 2019-06-13 DIAGNOSIS — R7989 Other specified abnormal findings of blood chemistry: Secondary | ICD-10-CM

## 2019-06-13 DIAGNOSIS — S5411XS Injury of median nerve at forearm level, right arm, sequela: Secondary | ICD-10-CM

## 2019-06-13 DIAGNOSIS — Z7189 Other specified counseling: Secondary | ICD-10-CM

## 2019-06-13 MED FILL — ROSUVASTATIN CALCIUM 40 MG: 40 | 30 days supply | Qty: 30 | Fill #2

## 2019-06-13 MED FILL — XARELTO 2.5 MG TABS: 2.5 | 30 days supply | Qty: 60 | Fill #3

## 2019-06-13 MED FILL — SYNJARDY 5-1,000 MG TABLET: 5-1000 | 30 days supply | Qty: 60 | Fill #3

## 2019-06-13 MED FILL — LOSARTAN POTASSIUM 50 MG TA: 50 | 30 days supply | Qty: 30 | Fill #5

## 2019-06-13 NOTE — Progress Notes (Signed)
Subjective:   HPI  Jennifer Cervantes is a 39 y.o. female who presents for Chief Complaint  Patient presents with  . Annual Exam    with fasting labs     Patient Care Team: [place], Camelia Eng, PA-C as PCP - General (Family Medicine) Herminio Commons, MD as PCP - Cardiology (Cardiology) Sees dentist Sees eye doctor Dr. Nat Math, gynecology [facility] Dr. Marcial Pacas and Noberto Retort, NP, neurology Dr. Christinia Gully, pulmonology   Concerns: Last visit here a while ago.  Just recently saw gynecology and cardiology  Been having some mid neck pain, posteriorly.  Getting some headaches.  Getting headache about 4 times in last 2 months.   Diabetes - Checking sugars, getting 100-150s.  Only taking synjardy once daily due to upset stomach  Has chronic constipation, if not using Linzess, can't have BM  Has ongoing pain and swelling right arm.  Prior abnormality, seeing vascular surgeon for consult soon.   Reviewed their medical, surgical, family, social, medication, and allergy history and updated chart as appropriate.  Past Medical History:  Diagnosis Date  . CAD (coronary artery disease)    a. LHC on 06/21/15 which showed 75% occl mid-dist LCx, 40% occl mRCA, 99% mid-dist LAD s/p DES and 50% occl mLAD s/p DES (overlapping stents) and significant LV dysfunction with anteroapical HK; b. 06/2015 Myoview: EF 55-65%, no ischemia/infarct.  . CHF (congestive heart failure) ([place])   . GERD (gastroesophageal reflux disease)   . Hyperlipidemia   . Hypertension   . Hypothyroidism   . Myocardial stunning    a. EF 45% on 2D ECHO on 06/21/15 and severe LV dysfunction on LV gram by cath. Repeat 2D ECHO with EF 55-60% and mild HK of the apicoseptal myocardium.  [name] Neuropraxia of right median nerve    a. suspected after right radial cath. will follow with neuro outpatient if does not resolve.   . NSTEMI (non-ST elevated myocardial infarction) [facility]) 05/2015   [facility]  .  Obesity   . 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 05/2015 Following PCI (radial access)-->conservatively managed.  . Tobacco abuse   . Tricuspid regurgitation   . Type II diabetes mellitus ([place])    a. Type II  . Vitamin D deficiency     Past Surgical History:  Procedure Laterality Date  . [place] STUDY N/A 07/14/2016   Procedure: [place] STUDY;  Surgeon: Mauri Pole, MD;  Location: [facility] ENDOSCOPY;  Service: Endoscopy;  Laterality: N/A;  . CARDIAC CATHETERIZATION N/A 06/21/2015   Procedure: Left Heart Cath and Coronary Angiography;  Surgeon: Belva Crome, MD; pLAD 99%, CFX 75%, RCA 40%, EF 35%   . CARDIAC CATHETERIZATION N/A 06/21/2015   Procedure: Coronary Stent Intervention;  Surgeon: Belva Crome, MD;  Promus Premier DES, 3.0 x 20 and 3.5 x 32 mm stents to the LAD  . CARDIAC CATHETERIZATION N/A 06/21/2015   Procedure: Intravascular Ultrasound/IVUS;  Surgeon: Belva Crome, MD;  Location: [place] CV LAB;  Service: Cardiovascular;  Laterality: N/A;  LAD  . CARDIAC CATHETERIZATION  06/22/2015   Procedure: Coronary/Graft Angiography;  Surgeon: Peter M Martinique, MD;  Location: [place] CV LAB;  Service: Cardiovascular;;  . CORONARY STENT INTERVENTION N/A 04/02/2016   Procedure: Coronary Stent Intervention;  Surgeon: Jettie Booze, MD;  Location: [place] [facility] LAB;  Service: Cardiovascular;  Laterality: N/A;  . ESOPHAGEAL MANOMETRY N/A 07/14/2016   Procedure: ESOPHAGEAL MANOMETRY (EM);  Surgeon:  Mauri Pole, MD;  Location: [facility];  Service: Endoscopy;  Laterality: N/A;  . LEFT HEART CATH AND CORONARY ANGIOGRAPHY N/A 04/02/2016   Procedure: Left Heart Cath and Coronary Angiography;  Surgeon: Jettie Booze, MD;  Location: [place] CV LAB;  Service: Cardiovascular;  Laterality: N/A;  . LEFT HEART CATH AND CORONARY ANGIOGRAPHY N/A 07/17/2016   Procedure: Left Heart Cath and Coronary Angiography;  Surgeon: Leonie Man, MD;  Location: [place] CV  LAB;  Service: Cardiovascular;  Laterality: N/A;    Social History   Socioeconomic History  . Marital status: Single    Spouse name: Not on file  . Number of children: 4  . Years of education: 15  . Highest education level: Not on file  Occupational History  . Occupation: Forensic psychologist: [employer]  Tobacco Use  . Smoking status: Current Every Day Smoker    Packs/day: 0.50    Years: 23.00    Pack years: 11.50    Types: Cigarettes    Last attempt to quit: 04/02/2015    Years since quitting: 4.2  . Smokeless tobacco: Never Used  Substance and Sexual Activity  . Alcohol use: No    Alcohol/week: 0.0 standard drinks  . Drug use: No  . Sexual activity: Not on file  Other Topics Concern  . Not on file  Social History Narrative   Lives in [place], works nights at [employer], [place]   Right-handed   Drinks 1 soda a day   Exercises some with walking   Lives with her son   Laverle Hobby her space, doesn't want a significant other   05/2019   Social Determinants of Health   Financial Resource Strain:   . Difficulty of Paying Living Expenses:   Food Insecurity:   . Worried About Charity fundraiser in the Last Year:   . Arboriculturist in the Last Year:   Transportation Needs:   . Film/video editor (Medical):   [name] Lack of Transportation (Non-Medical):   Physical Activity:   . Days of Exercise per Week:   . Minutes of Exercise per Session:   Stress:   . Feeling of Stress :   Social Connections:   . Frequency of Communication with Friends and Family:   . Frequency of Social Gatherings with Friends and Family:   . Attends Religious Services:   . Active Member of Clubs or Organizations:   . Attends Archivist Meetings:   [name] Marital Status:   Intimate Partner Violence:   . Fear of Current or Ex-Partner:   . Emotionally Abused:   [name] Physically Abused:   . Sexually Abused:     Family History  Problem Relation Age of Onset  . Diabetes Mother   . Healthy Father   .  Healthy Sister   . Healthy Brother   . Diabetes Maternal Grandmother   . Heart disease Maternal Grandmother   . Diabetes Maternal Aunt        x 2  . Heart attack Neg Hx   . Heart failure Neg Hx   . Stroke Neg Hx   . Hyperlipidemia Neg Hx   . Hypertension Neg Hx   . Stomach cancer Neg Hx   . Colon cancer Neg Hx      Current Outpatient Medications:  .  acetaminophen (TYLENOL) 500 MG tablet, Take 500 mg by mouth every 6 (six) hours as needed for headache (pain)., Disp: , Rfl:  .  aspirin 81 MG EC tablet, Take 1 tablet (81 mg total) by mouth daily., Disp: 90 tablet, Rfl: 3 .  Cholecalciferol (VITAMIN D) 50 MCG (2000 UT) tablet, Take 1 tablet (2,000 Units total) by mouth daily., Disp: 90 tablet, Rfl: 3 .  Continuous Blood Gluc Sensor (FREESTYLE LIBRE 14 DAY SENSOR) MISC, USE AS DIRECTED, Disp: 2 each, Rfl: 6 .  Empagliflozin-metFORMIN HCl (SYNJARDY) 06-998 MG TABS, Take 1 tablet by mouth 2 (two) times daily. (Patient taking differently: Take 1 tablet by mouth daily. ), Disp: 180 tablet, Rfl: 3 .  linaclotide (LINZESS) 145 MCG CAPS capsule, Take 1 capsule (145 mcg total) by mouth daily before breakfast., Disp: 90 capsule, Rfl: 1 .  losartan (COZAAR) 50 MG tablet, Take 1 tablet (50 mg total) by mouth daily., Disp: 90 tablet, Rfl: 3 .  metoprolol tartrate (LOPRESSOR) 25 MG tablet, Take 1 tablet (25 mg total) by mouth 2 (two) times daily., Disp: 60 tablet, Rfl: 6 .  nitroGLYCERIN (NITROSTAT) 0.4 MG SL tablet, PLACE 1 TABLET UNDER THE TONGUE EVERY 5 MINUTES AS NEEDED FOR CHEST PAIN. (Patient taking differently: Place 0.4 mg under the tongue every 5 (five) minutes as needed for chest pain. ), Disp: 25 tablet, Rfl: 3 .  oxyCODONE (OXY IR/ROXICODONE) 5 MG immediate release tablet, Take 5 mg by mouth every 4 (four) hours as needed for breakthrough pain., Disp: , Rfl:  .  promethazine (PHENERGAN) 12.5 MG tablet, Take 1 tablet (12.5 mg total) by mouth every 6 (six) hours as needed for nausea or vomiting.,  Disp: 15 tablet, Rfl: 0 .  rosuvastatin (CRESTOR) 40 MG tablet, TAKE 1 TABLET BY MOUTH DAILY., Disp: 30 tablet, Rfl: 2 .  XARELTO 2.5 MG TABS tablet, TAKE 1 TABLET BY MOUTH 2 TIMES DAILY., Disp: 60 tablet, Rfl: 6  No Known Allergies   Review of Systems Constitutional: -fever, -chills, -sweats, -unexpected weight change, -decreased appetite, -fatigue Allergy: -sneezing, -itching, -congestion Dermatology: -changing moles, --rash, -lumps ENT: -runny nose, -ear pain, -sore throat, -hoarseness, -sinus pain, -teeth pain, - ringing in ears, -hearing loss, -nosebleeds Cardiology: +chest pain, -palpitations, -swelling, -difficulty breathing when lying flat, -waking up short of breath Respiratory: -cough, -shortness of breath, -difficulty breathing with exercise or exertion, -wheezing, -coughing up blood Gastroenterology: -abdominal pain, -nausea, -vomiting, -diarrhea, -constipation, -blood in stool, -changes in bowel movement, -difficulty swallowing or eating Hematology: -bleeding, -bruising  Musculoskeletal: +joint aches, +muscle aches, -joint swelling, -back pain, +neck pain, -cramping, -changes in gait Ophthalmology: denies vision changes, eye redness, itching, discharge Urology: -burning with urination, -difficulty urinating, -blood in urine, -urinary frequency, -urgency, -incontinence Neurology: -headache, -weakness, -tingling, -numbness, -memory loss, -falls, -dizziness Psychology: -depressed mood, -agitation, -sleep problems Breast/gyn: -breast tendnerss, -discharge, -lumps, -vaginal discharge,- irregular periods, -heavy periods     Objective:  BP 134/76   Pulse 80   Temp 98.2 F (36.8 C)   Ht 5\' 9"  (1.753 m)   Wt 229 lb (103.9 kg)   SpO2 98%   BMI 33.82 kg/m   General appearance: alert, no distress, WD/WN, African American female Skin: no worrisome lesions Neck: supple, no lymphadenopathy, no thyromegaly, no masses, normal ROM, no bruits Chest: non tender, normal shape and  expansion Heart: RRR, normal S1, S2, no murmurs Lungs: CTA bilaterally, no wheezes, rhonchi, or rales Abdomen: +bs, soft, non tender, non distended, no masses, no hepatomegaly, no splenomegaly, no bruits Back: non tender, normal ROM, no scoliosis Musculoskeletal: Somewhat tender right upper arm, and proximal forearm on the right, otherwise upper extremities non tender,  no obvious deformity, normal ROM throughout, lower extremities non tender, no obvious deformity, normal ROM throughout Extremities: no edema, no cyanosis, no clubbing Pulses: Unable to palpate good right radial pulse, otherwise 2+ symmetric, upper and lower extremities, normal cap refill Neurological: alert, oriented x 3, CN2-12 intact, strength normal upper extremities and lower extremities, sensation normal throughout, DTRs 2+ throughout, no cerebellar signs, gait normal Psychiatric: normal affect, behavior normal, pleasant  Breast/gyn/rectal - deferred to gynecology    Diabetic Foot Exam - Simple   Simple Foot Form Diabetic Foot exam was performed with the following findings: Yes 06/13/2019  3:43 PM  Visual Inspection See comments: Yes Sensation Testing Intact to touch and monofilament testing bilaterally: Yes Pulse Check Posterior Tibialis and Dorsalis pulse intact bilaterally: Yes Comments Corn left lateral 5th toe, scar on left volar distal foot from prior razor blade injury      Assessment and Plan :   Encounter Diagnoses  Name Primary?  . Encounter for health maintenance examination in adult Yes  . History of non-ST elevation myocardial infarction (NSTEMI)   . Other hyperlipidemia   . Deficiency anemia   . Former smoker   . Vitamin D deficiency   . Vaccine counseling   . Coronary artery disease involving native coronary artery of native heart with angina pectoris ([place])   . Chronic diastolic heart failure ([place])   . Essential hypertension   . Gastroesophageal reflux disease, unspecified whether  esophagitis present   . Diabetes mellitus type 2 in obese ([place])   . Abnormal thyroid function test   . Right arm pain   . Paresthesia   . Right sided weakness   . Neurapraxia of right median nerve, sequela   . Snoring   . Vaccine refused by patient   . Screen for STD (sexually transmitted disease)   . Screening for cancer   . Cold sensitivity   . Abnormal thyroid blood test     Physical exam - discussed and counseled on healthy lifestyle, diet, exercise, preventative care, vaccinations, sick and well care, proper use of emergency dept and after hours care, and addressed their concerns.    Health screening: Advised they see their eye doctor yearly for routine vision care. Advised they see their dentist yearly for routine dental care including hygiene visits twice yearly. See your gynecologist yearly for routine gynecological care.  Discussed STD testing, discussed prevention, condom use, means of transmission   Vaccines: I recommend a pneumococcal 23 pneumonia vaccine once.  I recommend a yearly flu shot  Your tetanus booster is up to date  I recommend the covid vaccine.  She declines vaccines in general despite her risks   Cancer screening: I recommend a baseline mammogram  Counseled on self breast exams, mammograms, cervical cancer screening I recommend a monthly self breast exam and yearly breast exam in the office.   Is scheduling her first mammogram now.  I recommend pap smear every 1-3 years depending upon results.  She just had normal pap with gynecology last month in [place], [place]  Discussed skin surveillance.  Discussed routine labs  Colonoscopy:  Consider GI consult given constipation, hx/o anemia    Separate significant chronic issues discussed: Diabetes-past due for follow-up.  Labs today.  Discussed daily foot check, yearly eye doctor visit, glucose monitoring  Coronary artery disease, history of MI-I reviewed her recent cardiology notes from earlier this  month.  She does have a stress test scheduled for further evaluation no recent chest pain  Hyperlipidemia-routine labs  today  Right arm swelling chronic-she has follow-up with vascular surgery soon  Given her recent symptoms of feeling cold in her thighs and pain in her thighs as well as other generalized headaches and symptoms, labs today including thyroid  Chronic constipation- does ok on Linzess but not great.  Consider GI consult for updated colonoscopy  Jennifer Cervantes was seen today for annual exam.  Diagnoses and all orders for this visit:  Encounter for health maintenance examination in adult -     Comprehensive metabolic panel -     CBC with Differential/Platelet -     TSH -     Hemoglobin A1c -     Lipid panel -     Iron -     Microalbumin/Creatinine Ratio, Urine -     T4, free -     HIV Antibody (routine testing w rflx) -     RPR -     GC/Chlamydia Probe Amp  History of non-ST elevation myocardial infarction (NSTEMI) -     Lipid panel  Other hyperlipidemia -     Lipid panel  Deficiency anemia  Former smoker  Vitamin D deficiency  Vaccine counseling  Coronary artery disease involving native coronary artery of native heart with angina pectoris (HCC)  Chronic diastolic heart failure (HCC)  Essential hypertension  Gastroesophageal reflux disease, unspecified whether esophagitis present  Diabetes mellitus type 2 in obese (HCC) -     Comprehensive metabolic panel -     CBC with Differential/Platelet -     Hemoglobin A1c -     Microalbumin/Creatinine Ratio, Urine  Abnormal thyroid function test  Right arm pain  Paresthesia  Right sided weakness  Neurapraxia of right median nerve, sequela  Snoring  Vaccine refused by patient  Screen for STD (sexually transmitted disease) -     HIV Antibody (routine testing w rflx) -     RPR -     GC/Chlamydia Probe Amp  Screening for cancer  Cold sensitivity -     TSH -     T4, free  Abnormal thyroid blood  test -     TSH -     T4, free    Follow-up pending labs, yearly for physical

## 2019-06-14 LAB — COMPREHENSIVE METABOLIC PANEL
ALT: 15 IU/L (ref 0–32)
AST: 16 IU/L (ref 0–40)
Albumin/Globulin Ratio: 1.7 (ref 1.2–2.2)
Albumin: 5.4 g/dL — ABNORMAL HIGH (ref 3.8–4.8)
Alkaline Phosphatase: 89 IU/L (ref 39–117)
BUN/Creatinine Ratio: 10 (ref 9–23)
BUN: 9 mg/dL (ref 6–24)
Bilirubin Total: 0.4 mg/dL (ref 0.0–1.2)
CO2: 18 mmol/L — ABNORMAL LOW (ref 20–29)
Calcium: 10.5 mg/dL — ABNORMAL HIGH (ref 8.7–10.2)
Chloride: 107 mmol/L — ABNORMAL HIGH (ref 96–106)
Creatinine, Ser: 0.91 mg/dL (ref 0.57–1.00)
GFR calc Af Amer: 91 mL/min/{1.73_m2} (ref >59)
GFR calc non Af Amer: 79 mL/min/{1.73_m2} (ref >59)
Globulin, Total: 3.2 g/dL (ref 1.5–4.5)
Glucose: 102 mg/dL — ABNORMAL HIGH (ref 65–99)
Potassium: 3.9 mmol/L (ref 3.5–5.2)
Sodium: 145 mmol/L — ABNORMAL HIGH (ref 134–144)
Total Protein: 8.6 g/dL — ABNORMAL HIGH (ref 6.0–8.5)

## 2019-06-14 LAB — MICROALBUMIN / CREATININE URINE RATIO
Creatinine, Urine: 133.7 mg/dL
Microalb/Creat Ratio: 7 mg/g creat (ref 0–29)
Microalbumin, Urine: 8.8 ug/mL

## 2019-06-14 LAB — CBC WITH DIFFERENTIAL/PLATELET
Basophils Absolute: 0.1 10*3/uL (ref 0.0–0.2)
Basos: 1 %
EOS (ABSOLUTE): 0.2 10*3/uL (ref 0.0–0.4)
Eos: 2 %
Hematocrit: 46.3 % (ref 34.0–46.6)
Hemoglobin: 15.4 g/dL (ref 11.1–15.9)
Immature Grans (Abs): 0 10*3/uL (ref 0.0–0.1)
Immature Granulocytes: 0 %
Lymphocytes Absolute: 3.4 10*3/uL — ABNORMAL HIGH (ref 0.7–3.1)
Lymphs: 33 %
MCH: 31.6 pg (ref 26.6–33.0)
MCHC: 33.3 g/dL (ref 31.5–35.7)
MCV: 95 fL (ref 79–97)
Monocytes Absolute: 0.5 10*3/uL (ref 0.1–0.9)
Monocytes: 5 %
Neutrophils Absolute: 6.2 10*3/uL (ref 1.4–7.0)
Neutrophils: 59 %
Platelets: 243 10*3/uL (ref 150–450)
RBC: 4.88 x10E6/uL (ref 3.77–5.28)
RDW: 12.9 % (ref 11.7–15.4)
WBC: 10.3 10*3/uL (ref 3.4–10.8)

## 2019-06-14 LAB — HEMOGLOBIN A1C
Est. average glucose Bld gHb Est-mCnc: 148 mg/dL
Hgb A1c MFr Bld: 6.8 % — ABNORMAL HIGH (ref 4.8–5.6)

## 2019-06-14 LAB — HIV ANTIBODY (ROUTINE TESTING W REFLEX): HIV Screen 4th Generation wRfx: NONREACTIVE

## 2019-06-14 LAB — RPR: RPR Ser Ql: NONREACTIVE

## 2019-06-14 LAB — LIPID PANEL
Chol/HDL Ratio: 3 ratio (ref 0.0–4.4)
Cholesterol, Total: 162 mg/dL (ref 100–199)
HDL: 54 mg/dL (ref >39)
LDL Chol Calc (NIH): 93 mg/dL (ref 0–99)
Triglycerides: 79 mg/dL (ref 0–149)
VLDL Cholesterol Cal: 15 mg/dL (ref 5–40)

## 2019-06-14 LAB — IRON: Iron: 94 ug/dL (ref 27–159)

## 2019-06-14 LAB — GC/CHLAMYDIA PROBE AMP
Chlamydia trachomatis, NAA: NEGATIVE
Neisseria Gonorrhoeae by PCR: NEGATIVE

## 2019-06-14 LAB — T4, FREE: Free T4: 1.09 ng/dL (ref 0.82–1.77)

## 2019-06-14 LAB — TSH: TSH: 0.522 u[IU]/mL (ref 0.450–4.500)

## 2019-06-15 ENCOUNTER — Other Ambulatory Visit: Payer: Self-pay | Admitting: Medical

## 2019-06-15 MED ORDER — ASPIRIN 81 MG PO TBEC: 81.0000 mg | DELAYED_RELEASE_TABLET | Freq: Every day | ORAL | 3 refills | Status: DC

## 2019-06-15 MED ORDER — SYNJARDY 5-1000 MG PO TABS: 1.0000 | ORAL_TABLET | Freq: Every day | ORAL | 3 refills | Status: DC

## 2019-06-15 MED ORDER — ROSUVASTATIN CALCIUM 40 MG PO TABS: 40.0000 mg | ORAL_TABLET | Freq: Every day | ORAL | 3 refills | Status: DC

## 2019-06-15 MED ORDER — VITAMIN D 25 MCG (1000 UNIT) PO TABS: 1000.0000 [IU] | ORAL_TABLET | Freq: Every day | ORAL | 3 refills | Status: DC

## 2019-06-15 MED ORDER — LOSARTAN POTASSIUM 50 MG PO TABS: 50.0000 mg | ORAL_TABLET | Freq: Every day | ORAL | 3 refills | Status: DC

## 2019-06-15 MED FILL — ASPIRIN 81 MG TBEC: 81 | 90 days supply | Qty: 90 | Fill #0

## 2019-06-16 ENCOUNTER — Ambulatory Visit (HOSPITAL_COMMUNITY)
Admission: RE | Admit: 2019-06-16 | Discharge: 2019-06-16 | Disposition: A | Discharge status: Home / Self Care | Payer: 59 | Source: Ambulatory Visit | Attending: Cardiovascular Disease | Admitting: Cardiovascular Disease

## 2019-06-16 ENCOUNTER — Encounter (HOSPITAL_COMMUNITY)
Admission: RE | Admit: 2019-06-16 | Discharge: 2019-06-16 | Disposition: A | Discharge status: Home / Self Care | Payer: 59 | Source: Ambulatory Visit | Attending: Cardiovascular Disease | Admitting: Cardiovascular Disease

## 2019-06-16 ENCOUNTER — Other Ambulatory Visit: Payer: Self-pay

## 2019-06-16 DIAGNOSIS — I25118 Atherosclerotic heart disease of native coronary artery with other forms of angina pectoris: Secondary | ICD-10-CM | POA: Diagnosis not present

## 2019-06-16 DIAGNOSIS — R072 Precordial pain: Secondary | ICD-10-CM | POA: Insufficient documentation

## 2019-06-16 DIAGNOSIS — R079 Chest pain, unspecified: Secondary | ICD-10-CM | POA: Diagnosis not present

## 2019-06-16 LAB — NM MYOCAR MULTI W/SPECT W/WALL MOTION / EF
LV dias vol: 122 mL (ref 46–106)
LV sys vol: 39 mL
Peak HR: 103 {beats}/min
RATE: 0.7
Rest HR: 67 {beats}/min
SDS: 2
SRS: 0
SSS: 2
TID: 1.05

## 2019-06-16 MED ORDER — TECHNETIUM TC 99M TETROFOSMIN IV KIT
10.0000 | PACK | Freq: Once | INTRAVENOUS | Status: AC
  Administered 2019-06-16: 11 via INTRAVENOUS

## 2019-06-16 MED ORDER — REGADENOSON 0.4 MG/5ML IV SOLN
INTRAVENOUS | Status: AC
  Administered 2019-06-16: 0.4 mg via INTRAVENOUS
  Filled 2019-06-16: qty 5

## 2019-06-16 MED ORDER — TECHNETIUM TC 99M TETROFOSMIN IV KIT
30.0000 | PACK | Freq: Once | INTRAVENOUS | Status: AC | PRN
  Administered 2019-06-16: 32.1 via INTRAVENOUS

## 2019-06-16 MED ORDER — SODIUM CHLORIDE FLUSH 0.9 % IV SOLN
INTRAVENOUS | Status: AC
  Administered 2019-06-16: 10 mL via INTRAVENOUS
  Filled 2019-06-16: qty 10

## 2019-06-23 ENCOUNTER — Telehealth: Payer: Self-pay | Admitting: *Deleted

## 2019-06-23 NOTE — Telephone Encounter (Signed)
-----   Message from Herminio Commons, MD sent at 06/16/2019 11:55 AM EDT ----- No evidence of blockages

## 2019-06-23 NOTE — Telephone Encounter (Signed)
Jennifer Cervantes, [name]  [id number] PM EDT    Patient notified. Copy to pcp.

## 2019-07-27 MED FILL — LOSARTAN POTASSIUM 50 MG TA: 50 | 90 days supply | Qty: 90 | Fill #0

## 2019-08-22 ENCOUNTER — Other Ambulatory Visit: Payer: Self-pay | Admitting: Cardiovascular Disease

## 2019-08-22 ENCOUNTER — Other Ambulatory Visit: Payer: Self-pay | Admitting: Medical

## 2019-08-22 MED FILL — XARELTO 2.5 MG TABS: 2.5 | 30 days supply | Qty: 60 | Fill #0

## 2019-08-22 MED FILL — METOPROLOL TARTRATE 25 MG T: 25 | 30 days supply | Qty: 60 | Fill #1

## 2019-09-10 MED FILL — SYNJARDY 5-1,000 MG TABLET: 5-1000 | 90 days supply | Qty: 90 | Fill #0

## 2019-09-20 NOTE — Progress Notes (Signed)
CARDIOLOGY CONSULT NOTE       Patient ID: DEVON KINGDON MRN: [id number] DOB/AGE: 41-Aug-1982 41 y.o.  Admit date: (Not on file) Referring Physician: Bronson Ing Primary Physician: Carlena Hurl, PA-C Primary Cardiologist: new Reason for Consultation: CAD  Active Problems:   * No active hospital problems. *   HPI:  39 y.o. new to me Previously seen by DR Bronson Ing. 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 EF preserved 55-65% When last seen by Dr Raliegh Ip had more atypical pains Myovue 06/16/19 normal with no ischemia EF 68% She has had right radial occlusion after multiple caths and CTA 10/07/17 showed dominant ulnar run off in right hand She was started back on beta blocker in April She has seen [facility] Dr Biagio Quint and had multiple nerve condution studies and MRI in regard to right arm pain/swelling   She needs refill on nitro Has 3 older kids out of house and one 15 yo in [place] All other systems reviewed and negative except as noted above  Past Medical History:  Diagnosis Date  . CAD (coronary artery disease)    a. LHC on 06/21/15 which showed 75% occl mid-dist LCx, 40% occl mRCA, 99% mid-dist LAD s/p DES and 50% occl mLAD s/p DES (overlapping stents) and significant LV dysfunction with anteroapical HK; b. 06/2015 Myoview: EF 55-65%, no ischemia/infarct.  . CHF (congestive heart failure) ([place])   . GERD (gastroesophageal reflux disease)   . Hyperlipidemia   . Hypertension   . Hypothyroidism   . Myocardial stunning    a. EF 45% on 2D ECHO on 06/21/15 and severe LV dysfunction on LV gram by cath. Repeat 2D ECHO with EF 55-60% and mild HK of the apicoseptal myocardium.  [name] Neuropraxia of right median nerve    a. suspected after right radial cath. will follow with neuro outpatient if does not resolve.   . NSTEMI (non-ST  elevated myocardial infarction) [facility]) 05/2015   [facility]  . Obesity   . Right radial artery thrombus ([place])    a. 05/2015 Following PCI (radial access)-->conservatively managed.  . Tobacco abuse   . Tricuspid regurgitation   . Type II diabetes mellitus ([place])    a. Type II  . Vitamin D deficiency     Family History  Problem Relation Age of Onset  . Diabetes Mother   . Healthy Father   . Healthy Sister   . Healthy Brother   . Diabetes Maternal Grandmother   . Heart disease Maternal Grandmother   . Diabetes Maternal Aunt        x 2  . Heart attack Neg Hx   . Heart failure Neg Hx   . Stroke Neg Hx   . Hyperlipidemia Neg Hx   . Hypertension Neg Hx   . Stomach cancer Neg Hx   . Colon cancer Neg Hx     Social History   Socioeconomic History  . Marital status: Single    Spouse name: Not on file  . Number of children: 4  . Years of education: 70  . Highest education level: Not on file  Occupational History  . Occupation: Forensic psychologist: [employer]  Tobacco Use  . Smoking status: Current Every Day Smoker    Packs/day: 0.50    Years: 23.00    Pack years: 11.50    Types:  Cigarettes    Last attempt to quit: 04/02/2015    Years since quitting: 4.5  . Smokeless tobacco: Never Used  Vaping Use  . Vaping Use: Never used  Substance and Sexual Activity  . Alcohol use: No    Alcohol/week: 0.0 standard drinks  . Drug use: No  . Sexual activity: Not on file  Other Topics Concern  . Not on file  Social History Narrative   Lives in [place], works nights at [employer], [place]   Right-handed   Drinks 1 soda a day   Exercises some with walking   Lives with her son   Laverle Hobby her space, doesn't want a significant other   05/2019   Social Determinants of Health   Financial Resource Strain:   . Difficulty of Paying Living Expenses:   Food Insecurity:   . Worried About Charity fundraiser in the Last Year:   . Arboriculturist in the Last Year:   Transportation Needs:   . Lexicographer (Medical):   [name] Lack of Transportation (Non-Medical):   Physical Activity:   . Days of Exercise per Week:   . Minutes of Exercise per Session:   Stress:   . Feeling of Stress :   Social Connections:   . Frequency of Communication with Friends and Family:   . Frequency of Social Gatherings with Friends and Family:   . Attends Religious Services:   . Active Member of Clubs or Organizations:   . Attends Archivist Meetings:   [name] Marital Status:   Intimate Partner Violence:   . Fear of Current or Ex-Partner:   . Emotionally Abused:   [name] Physically Abused:   . Sexually Abused:     Past Surgical History:  Procedure Laterality Date  . [place] STUDY N/A 07/14/2016   Procedure: [place] STUDY;  Surgeon: Mauri Pole, MD;  [facility];  Service: Endoscopy;  Laterality: N/A;  . CARDIAC CATHETERIZATION N/A 06/21/2015   Procedure: Left Heart Cath and Coronary Angiography;  Surgeon: Belva Crome, MD; pLAD 99%, CFX 75%, RCA 40%, EF 35%   . CARDIAC CATHETERIZATION N/A 06/21/2015   Procedure: Coronary Stent Intervention;  Surgeon: Belva Crome, MD;  Promus Premier DES, 3.0 x 20 and 3.5 x 32 mm stents to the LAD  . CARDIAC CATHETERIZATION N/A 06/21/2015   Procedure: Intravascular Ultrasound/IVUS;  Surgeon: Belva Crome, MD;  Location: [place] CV LAB;  Service: Cardiovascular;  Laterality: N/A;  LAD  . CARDIAC CATHETERIZATION  06/22/2015   Procedure: Coronary/Graft Angiography;  Surgeon: Raivyn Kabler M Martinique, MD;  Location: [place] CV LAB;  Service: Cardiovascular;;  . CORONARY STENT INTERVENTION N/A 04/02/2016   Procedure: Coronary Stent Intervention;  Surgeon: Jettie Booze, MD;  Location: [place] CV LAB;  Service: Cardiovascular;  Laterality: N/A;  . ESOPHAGEAL MANOMETRY N/A 07/14/2016   Procedure: ESOPHAGEAL MANOMETRY (EM);  Surgeon: Mauri Pole, MD;  [facility];  Service: Endoscopy;  Laterality: N/A;  . LEFT HEART CATH AND  CORONARY ANGIOGRAPHY N/A 04/02/2016   Procedure: Left Heart Cath and Coronary Angiography;  Surgeon: Jettie Booze, MD;  Location: [place] CV LAB;  Service: Cardiovascular;  Laterality: N/A;  . LEFT HEART CATH AND CORONARY ANGIOGRAPHY N/A 07/17/2016   Procedure: Left Heart Cath and Coronary Angiography;  Surgeon: Leonie Man, MD;  Location: [place] CV LAB;  Service: Cardiovascular;  Laterality: N/A;      Current Outpatient  Medications:  .  acetaminophen (TYLENOL) 500 MG tablet, Take 500 mg by mouth every 6 (six) hours as needed for headache (pain)., Disp: , Rfl:  .  aspirin 81 MG EC tablet, Take 1 tablet (81 mg total) by mouth daily., Disp: 90 tablet, Rfl: 3 .  cholecalciferol (VITAMIN D3) 25 MCG (1000 UNIT) tablet, Take 1 tablet (1,000 Units total) by mouth daily., Disp: 90 tablet, Rfl: 3 .  Continuous Blood Gluc Sensor (FREESTYLE LIBRE 14 DAY SENSOR) MISC, USE AS DIRECTED, Disp: 2 each, Rfl: 6 .  Empagliflozin-metFORMIN HCl (SYNJARDY) 06-998 MG TABS, Take 1 tablet by mouth daily., Disp: 90 tablet, Rfl: 3 .  linaclotide (LINZESS) 145 MCG CAPS capsule, Take 1 capsule (145 mcg total) by mouth daily before breakfast., Disp: 90 capsule, Rfl: 1 .  losartan (COZAAR) 50 MG tablet, Take 1 tablet (50 mg total) by mouth daily., Disp: 90 tablet, Rfl: 3 .  metoprolol tartrate (LOPRESSOR) 25 MG tablet, Take 1 tablet (25 mg total) by mouth 2 (two) times daily., Disp: 60 tablet, Rfl: 6 .  nitroGLYCERIN (NITROSTAT) 0.4 MG SL tablet, PLACE 1 TABLET UNDER THE TONGUE EVERY 5 MINUTES AS NEEDED FOR CHEST PAIN., Disp: 25 tablet, Rfl: 3 .  oxyCODONE (OXY IR/ROXICODONE) 5 MG immediate release tablet, Take 5 mg by mouth every 4 (four) hours as needed for breakthrough pain., Disp: , Rfl:  .  promethazine (PHENERGAN) 12.5 MG tablet, Take 1 tablet (12.5 mg total) by mouth every 6 (six) hours as needed for nausea or vomiting., Disp: 15 tablet, Rfl: 0 .  rosuvastatin (CRESTOR) 40 MG tablet, Take 1 tablet (40  mg total) by mouth daily., Disp: 90 tablet, Rfl: 3 .  XARELTO 2.5 MG TABS tablet, TAKE 1 TABLET BY MOUTH 2 TIMES DAILY., Disp: 60 tablet, Rfl: 6    Physical Exam: Blood pressure 106/82, pulse 83, height 5\' 9"  (1.753 m), weight 230 lb 9.6 oz (104.6 kg), SpO2 98 %.    Affect appropriate Chronically ill black female  HEENT: normal Neck supple with no adenopathy JVP normal no bruits no thyromegaly Lungs clear with no wheezing and good diaphragmatic motion Heart:  S1/S2 no murmur, no rub, gallop or click PMI normal Abdomen: benighn, BS positve, no tenderness, no AAA no bruit.  No HSM or HJR Absent right radial pulse good ulnar with normal palmer perfusion  No edema Neuro decreased sensation right forearm/hand chronic  Skin warm and dry No muscular weakness   Labs:   Lab Results  Component Value Date   WBC 10.3 06/13/2019   HGB 15.4 06/13/2019   HCT 46.3 06/13/2019   MCV 95 06/13/2019   PLT 243 06/13/2019   No results for input(s): NA, K, CL, CO2, BUN, CREATININE, CALCIUM, PROT, BILITOT, ALKPHOS, ALT, AST, GLUCOSE in the last 168 hours.  Invalid input(s): LABALBU Lab Results  Component Value Date   TROPONINI <0.03 05/25/2017    Lab Results  Component Value Date   CHOL 162 06/13/2019   CHOL 232 (H) 07/05/2018   CHOL 138 09/17/2017   Lab Results  Component Value Date   HDL 54 06/13/2019   HDL 52 07/05/2018   HDL 50 09/17/2017   Lab Results  Component Value Date   LDLCALC 93 06/13/2019   LDLCALC 159 (H) 07/05/2018   LDLCALC 70 09/17/2017   Lab Results  Component Value Date   TRIG 79 06/13/2019   TRIG 107 07/05/2018   TRIG 91 09/17/2017   Lab Results  Component Value Date   CHOLHDL  3.0 06/13/2019   CHOLHDL 4.5 07/05/2018   CHOLHDL 2.8 09/17/2017   No results found for: LDLDIRECT    Radiology: No results found.  EKG: SR rate 89 normal 06/07/19    ASSESSMENT AND PLAN:   1. CAD: post multiple stents to LAD most recently 07/17/16 with normal myovue no  ischemia 06/16/19 continue medical Rx Avoid right arm for future catheterizations  2. Right Arm Pain: chronic perfused by ulnar f/u [facility] Early   3. HTN:  Well controlled.  Continue current medications and low sodium Dash type diet.    4. HLD on statin labs with primary   5. DM:  Discussed low carb diet.  Target hemoglobin A1c is 6.5 or less.  Continue current medications.  F/U in 6 months   Signed: Jenkins Rouge 10/04/2019, 11:06 AM

## 2019-09-21 ENCOUNTER — Ambulatory Visit: Payer: 59 | Admitting: Cardiovascular Disease

## 2019-10-04 ENCOUNTER — Ambulatory Visit: Payer: 59 | Admitting: Cardiovascular Disease

## 2019-10-04 ENCOUNTER — Other Ambulatory Visit: Payer: Self-pay

## 2019-10-04 ENCOUNTER — Encounter: Payer: Self-pay | Admitting: Cardiovascular Disease

## 2019-10-04 VITALS — BP 106/82 | HR 83 | Ht 69.0 in | Wt 230.6 lb

## 2019-10-04 DIAGNOSIS — I2510 Atherosclerotic heart disease of native coronary artery without angina pectoris: Secondary | ICD-10-CM | POA: Diagnosis not present

## 2019-10-04 MED ORDER — NITROGLYCERIN 0.4 MG SL SUBL: 0.4000 mg | SUBLINGUAL_TABLET | SUBLINGUAL | 6 refills | Status: DC | PRN

## 2019-10-04 MED FILL — NITROGLYCERIN 0.4 MG TAB SL: 0.4 | 5 days supply | Qty: 25 | Fill #0

## 2019-10-04 NOTE — Patient Instructions (Signed)
Medication Instructions:  Your physician recommends that you continue on your current medications as directed. Please refer to the Current Medication list given to you today.  *If you need a refill on your cardiac medications before your next appointment, please call your pharmacy*   Lab Work: None today If you have labs (blood work) drawn today and your tests are completely normal, you will receive your results only by: . MyChart Message (if you have MyChart) OR . A paper copy in the mail If you have any lab test that is abnormal or we need to change your treatment, we will call you to review the results.   Testing/Procedures: None today   Follow-Up: At [facility], you and your health needs are our priority.  As part of our continuing mission to provide you with exceptional heart care, we have created designated Provider Care Teams.  These Care Teams include your primary Cardiologist (physician) and Advanced Practice Providers (APPs -  Physician Assistants and Nurse Practitioners) who all work together to provide you with the care you need, when you need it.  We recommend signing up for the patient portal called "MyChart".  Sign up information is provided on this After Visit Summary.  MyChart is used to connect with patients for Virtual Visits (Telemedicine).  Patients are able to view lab/test results, encounter notes, upcoming appointments, etc.  Non-urgent messages can be sent to your provider as well.   To learn more about what you can do with MyChart, go to [email or web address].    Your next appointment:   6 month(s)  The format for your next appointment:   In Person  Provider:   Peter Nishan, MD   Other Instructions None      Thank you for choosing [facility] !         

## 2019-10-20 DIAGNOSIS — Z23 Encounter for immunization: Secondary | ICD-10-CM | POA: Diagnosis not present

## 2019-11-07 MED FILL — METOPROLOL TARTRATE 25 MG T: 25 | 30 days supply | Qty: 60 | Fill #2

## 2019-11-07 MED FILL — ASPIRIN LOW DOSE 81 MG TBEC: 81 | 90 days supply | Qty: 90 | Fill #1

## 2019-11-28 MED FILL — LOSARTAN POTASSIUM 50 MG TA: 50 | 90 days supply | Qty: 90 | Fill #1

## 2019-11-28 MED FILL — ROSUVASTATIN CALCIUM 40 MG: 40 | 90 days supply | Qty: 90 | Fill #1

## 2019-11-28 MED FILL — XARELTO 2.5 MG TABS: 2.5 | 30 days supply | Qty: 60 | Fill #1

## 2020-01-09 MED FILL — SYNJARDY 5-1,000 MG TABLET: 5-1000 | 90 days supply | Qty: 90 | Fill #1

## 2020-02-13 MED FILL — XARELTO 2.5 MG TABS: 2.5 | 30 days supply | Qty: 60 | Fill #2

## 2020-03-07 MED FILL — ROSUVASTATIN CALCIUM 40 MG: 40 | 90 days supply | Qty: 90 | Fill #2

## 2020-03-07 MED FILL — ASPIRIN LOW DOSE 81 MG TBEC: 81 | 90 days supply | Qty: 90 | Fill #2

## 2020-03-07 MED FILL — LOSARTAN POTASSIUM 50 MG TA: 50 | 30 days supply | Qty: 30 | Fill #2

## 2020-03-29 NOTE — Progress Notes (Signed)
CARDIOLOGY CONSULT NOTE       Patient ID: Jennifer Cervantes MRN: [id number] DOB/AGE: 08/21/1980 40 y.o.  Admit date: (Not on file) Referring Physician: Bronson Ing Primary Physician: Carlena Hurl, PA-C Primary Cardiologist: new Reason for Consultation: CAD  Active Problems:   * No active hospital problems. *   HPI:  40 y.o. new to me Previously seen by DR Bronson Ing. 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 CAD (coronary artery disease)    a. LHC on 06/21/15 which showed 75% occl mid-dist LCx, 40% occl mRCA, 99% mid-dist LAD s/p DES and 50% occl mLAD s/p DES (overlapping stents) and significant LV dysfunction with anteroapical HK; b. 06/2015 Myoview: EF 55-65%, no ischemia/infarct.  . CHF (congestive heart failure) ([place])   . GERD (gastroesophageal reflux disease)   . Hyperlipidemia   . Hypertension   . Hypothyroidism   . Myocardial stunning    a. EF 45% on 2D ECHO on 06/21/15 and severe LV dysfunction on LV gram by cath. Repeat 2D ECHO with EF 55-60% and mild HK of the apicoseptal myocardium.  [name] Neuropraxia of right median nerve    a. suspected after right radial cath. will follow with neuro  outpatient if does not resolve.   . NSTEMI (non-ST elevated myocardial infarction) [facility]) 05/2015   [facility]  . Obesity   . 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 05/2015 Following PCI (radial access)-->conservatively managed.  . Tobacco abuse   . Tricuspid regurgitation   . Type II diabetes mellitus ([place])    a. Type II  . Vitamin D deficiency     Family History  Problem Relation Age of Onset  . Diabetes Mother   . Healthy Father   . Healthy Sister   . Healthy Brother   . Diabetes Maternal Grandmother   . Heart disease Maternal Grandmother   . Diabetes Maternal Aunt        x 2  . Heart attack Neg Hx   . Heart failure Neg Hx   . Stroke Neg Hx   . Hyperlipidemia Neg Hx   . Hypertension Neg Hx   . Stomach cancer Neg Hx   . Colon cancer Neg Hx     Social History   Socioeconomic History  . Marital status: Single    Spouse name: Not on file  . Number of children: 4  . Years of education: 71  . Highest education level: Not on file  Occupational History  . Occupation: Forensic psychologist: [employer]  Tobacco Use  . Smoking status: Current Every Day Smoker    Packs/day: 0.50    Years: 23.00  Pack years: 11.50    Types: Cigarettes    Last attempt to quit: 04/02/2015    Years since quitting: 5.0  . Smokeless tobacco: Never Used  Vaping Use  . Vaping Use: Never used  Substance and Sexual Activity  . Alcohol use: No    Alcohol/week: 0.0 standard drinks  . Drug use: No  . Sexual activity: Not on file  Other Topics Concern  . Not on file  Social History Narrative   Lives in [place], works nights at [employer], [place]   Right-handed   Drinks 1 soda a day   Exercises some with walking   Lives with her son   Laverle Hobby her space, doesn't want a significant other   05/2019   Social Determinants of Health   Financial Resource Strain: Not on file  Food Insecurity: Not on file  Transportation Needs: Not on file  Physical Activity: Not on file  Stress: Not on file  Social  Connections: Not on file  Intimate Partner Violence: Not on file    Past Surgical History:  Procedure Laterality Date  . [place] STUDY N/A 07/14/2016   Procedure: [place] STUDY;  Surgeon: Mauri Pole, MD;  [facility];  Service: Endoscopy;  Laterality: N/A;  . CARDIAC CATHETERIZATION N/A 06/21/2015   Procedure: Left Heart Cath and Coronary Angiography;  Surgeon: Belva Crome, MD; pLAD 99%, CFX 75%, RCA 40%, EF 35%   . CARDIAC CATHETERIZATION N/A 06/21/2015   Procedure: Coronary Stent Intervention;  Surgeon: Belva Crome, MD;  Promus Premier DES, 3.0 x 20 and 3.5 x 32 mm stents to the LAD  . CARDIAC CATHETERIZATION N/A 06/21/2015   Procedure: Intravascular Ultrasound/IVUS;  Surgeon: Belva Crome, MD;  Location: [place] CV LAB;  Service: Cardiovascular;  Laterality: N/A;  LAD  . CARDIAC CATHETERIZATION  06/22/2015   Procedure: Coronary/Graft Angiography;  Surgeon: Larico Dimock M Martinique, MD;  Location: [place] CV LAB;  Service: Cardiovascular;;  . CORONARY STENT INTERVENTION N/A 04/02/2016   Procedure: Coronary Stent Intervention;  Surgeon: Jettie Booze, MD;  Location: [place] CV LAB;  Service: Cardiovascular;  Laterality: N/A;  . ESOPHAGEAL MANOMETRY N/A 07/14/2016   Procedure: ESOPHAGEAL MANOMETRY (EM);  Surgeon: Mauri Pole, MD;  [facility];  Service: Endoscopy;  Laterality: N/A;  . LEFT HEART CATH AND CORONARY ANGIOGRAPHY N/A 04/02/2016   Procedure: Left Heart Cath and Coronary Angiography;  Surgeon: Jettie Booze, MD;  Location: [place] CV LAB;  Service: Cardiovascular;  Laterality: N/A;  . LEFT HEART CATH AND CORONARY ANGIOGRAPHY N/A 07/17/2016   Procedure: Left Heart Cath and Coronary Angiography;  Surgeon: Leonie Man, MD;  Location: [place] CV LAB;  Service: Cardiovascular;  Laterality: N/A;      Current Outpatient Medications:  .  acetaminophen (TYLENOL) 500 MG tablet, Take 500 mg by mouth every 6 (six) hours as  needed for headache (pain)., Disp: , Rfl:  .  aspirin 81 MG EC tablet, Take 1 tablet (81 mg total) by mouth daily., Disp: 90 tablet, Rfl: 3 .  cholecalciferol (VITAMIN D3) 25 MCG (1000 UNIT) tablet, Take 1 tablet (1,000 Units total) by mouth daily., Disp: 90 tablet, Rfl: 3 .  Continuous Blood Gluc Sensor (FREESTYLE LIBRE 14 DAY SENSOR) MISC, USE AS DIRECTED, Disp: 2 each, Rfl: 6 .  Empagliflozin-metFORMIN HCl (SYNJARDY) 06-998 MG TABS, Take 1 tablet by mouth daily., Disp: 90 tablet, Rfl: 3 .  linaclotide (LINZESS) 145 MCG CAPS capsule, Take  1 capsule (145 mcg total) by mouth daily before breakfast., Disp: 90 capsule, Rfl: 1 .  losartan (COZAAR) 50 MG tablet, Take 1 tablet (50 mg total) by mouth daily., Disp: 90 tablet, Rfl: 3 .  metoprolol tartrate (LOPRESSOR) 25 MG tablet, Take 1 tablet (25 mg total) by mouth 2 (two) times daily., Disp: 60 tablet, Rfl: 6 .  nitroGLYCERIN (NITROSTAT) 0.4 MG SL tablet, Place 1 tablet (0.4 mg total) under the tongue every 5 (five) minutes as needed for chest pain., Disp: 30 tablet, Rfl: 6 .  oxyCODONE (OXY IR/ROXICODONE) 5 MG immediate release tablet, Take 5 mg by mouth every 4 (four) hours as needed for breakthrough pain., Disp: , Rfl:  .  promethazine (PHENERGAN) 12.5 MG tablet, Take 1 tablet (12.5 mg total) by mouth every 6 (six) hours as needed for nausea or vomiting., Disp: 15 tablet, Rfl: 0 .  rosuvastatin (CRESTOR) 40 MG tablet, Take 1 tablet (40 mg total) by mouth daily., Disp: 90 tablet, Rfl: 3 .  XARELTO 2.5 MG TABS tablet, TAKE 1 TABLET BY MOUTH 2 TIMES DAILY., Disp: 60 tablet, Rfl: 6    Physical Exam: Blood pressure 118/80, pulse 88, height 5\' 9"  (1.753 m), weight 104.1 kg, SpO2 99 %.    Affect appropriate Chronically ill black female  HEENT: normal Neck supple with no adenopathy JVP normal no bruits no thyromegaly Lungs clear with no wheezing and good diaphragmatic motion Heart:  S1/S2 no murmur, no rub, gallop or click PMI normal Abdomen:  benighn, BS positve, no tenderness, no AAA no bruit.  No HSM or HJR Absent right radial pulse good ulnar with normal palmer perfusion  No edema Neuro decreased sensation right forearm/hand chronic  Skin warm and dry No muscular weakness   Labs:   Lab Results  Component Value Date   WBC 10.3 06/13/2019   HGB 15.4 06/13/2019   HCT 46.3 06/13/2019   MCV 95 06/13/2019   PLT 243 06/13/2019   No results for input(s): NA, K, CL, CO2, BUN, CREATININE, CALCIUM, PROT, BILITOT, ALKPHOS, ALT, AST, GLUCOSE in the last 168 hours.  Invalid input(s): LABALBU Lab Results  Component Value Date   TROPONINI <0.03 05/25/2017    Lab Results  Component Value Date   CHOL 162 06/13/2019   CHOL 232 (H) 07/05/2018   CHOL 138 09/17/2017   Lab Results  Component Value Date   HDL 54 06/13/2019   HDL 52 07/05/2018   HDL 50 09/17/2017   Lab Results  Component Value Date   LDLCALC 93 06/13/2019   LDLCALC 159 (H) 07/05/2018   LDLCALC 70 09/17/2017   Lab Results  Component Value Date   TRIG 79 06/13/2019   TRIG 107 07/05/2018   TRIG 91 09/17/2017   Lab Results  Component Value Date   CHOLHDL 3.0 06/13/2019   CHOLHDL 4.5 07/05/2018   CHOLHDL 2.8 09/17/2017   No results found for: LDLDIRECT    Radiology: No results found.  EKG: SR rate 89 normal 06/07/19    ASSESSMENT AND PLAN:   1. CAD: post multiple stents to LAD most recently 07/17/16 with normal myovue no ischemia 06/16/19 continue medical Rx Avoid right arm for future catheterizations  2. Right Arm Pain: chronic perfused by ulnar f/u [facility] Early   3. HTN:  Well controlled.  Continue current medications and low sodium Dash type diet.    4. HLD on statin labs with primary   5. DM:  Discussed low carb diet.  Target hemoglobin A1c is 6.5  or less.  Continue current medications.  F/U in 6 months   Signed: Jenkins Rouge 04/06/2020, 10:58 AM

## 2020-04-06 ENCOUNTER — Encounter: Payer: Self-pay | Admitting: Cardiovascular Disease

## 2020-04-06 ENCOUNTER — Other Ambulatory Visit: Payer: Self-pay

## 2020-04-06 ENCOUNTER — Ambulatory Visit: Payer: 59 | Admitting: Cardiovascular Disease

## 2020-04-06 VITALS — BP 118/80 | HR 88 | Ht 69.0 in | Wt 229.6 lb

## 2020-04-06 DIAGNOSIS — I10 Essential (primary) hypertension: Secondary | ICD-10-CM

## 2020-04-06 DIAGNOSIS — I2510 Atherosclerotic heart disease of native coronary artery without angina pectoris: Secondary | ICD-10-CM | POA: Diagnosis not present

## 2020-04-06 NOTE — Patient Instructions (Signed)
Medication Instructions:  Your physician recommends that you continue on your current medications as directed. Please refer to the Current Medication list given to you today.  *If you need a refill on your cardiac medications before your next appointment, please call your pharmacy*   Lab Work: NONE   If you have labs (blood work) drawn today and your tests are completely normal, you will receive your results only by: [name] MyChart Message (if you have MyChart) OR . A paper copy in the mail If you have any lab test that is abnormal or we need to change your treatment, we will call you to review the results.   Testing/Procedures: NONE   Follow-Up: At [facility], you and your health needs are our priority.  As part of our continuing mission to provide you with exceptional heart care, we have created designated Provider Care Teams.  These Care Teams include your primary Cardiologist (physician) and Advanced Practice Providers (APPs -  Physician Assistants and Nurse Practitioners) who all work together to provide you with the care you need, when you need it.  We recommend signing up for the patient portal called "MyChart".  Sign up information is provided on this After Visit Summary.  MyChart is used to connect with patients for Virtual Visits (Telemedicine).  Patients are able to view lab/test results, encounter notes, upcoming appointments, etc.  Non-urgent messages can be sent to your provider as well.   To learn more about what you can do with MyChart, go to [email or web address].    Your next appointment:   1 year(s)  The format for your next appointment:   In Person  Provider:   You may see Dr. Johnsie Cancel or one of the following Advanced Practice Providers on your designated Care Team:    Bernerd Pho, PA-C   Ermalinda Barrios, PA-C     Other Instructions Thank you for choosing [facility]!

## 2020-04-26 ENCOUNTER — Other Ambulatory Visit: Payer: Self-pay | Admitting: Medical

## 2020-04-26 MED FILL — XARELTO 2.5 MG TABS: 2.5 | 30 days supply | Qty: 60 | Fill #3

## 2020-04-26 MED FILL — SYNJARDY 5-1,000 MG TABLET: 5-1000 | 90 days supply | Qty: 90 | Fill #2

## 2020-04-26 MED FILL — LOSARTAN POTASSIUM 50 MG TA: 50 | 30 days supply | Qty: 30 | Fill #3

## 2020-04-27 ENCOUNTER — Other Ambulatory Visit: Payer: Self-pay | Admitting: Medical

## 2020-05-04 ENCOUNTER — Telehealth: Payer: Self-pay

## 2020-05-04 NOTE — Telephone Encounter (Signed)
P.A. Darby completed

## 2020-05-07 ENCOUNTER — Other Ambulatory Visit (HOSPITAL_COMMUNITY): Payer: Self-pay | Admitting: Medical

## 2020-05-26 ENCOUNTER — Other Ambulatory Visit (HOSPITAL_COMMUNITY): Payer: Self-pay

## 2020-06-04 ENCOUNTER — Other Ambulatory Visit (HOSPITAL_COMMUNITY): Payer: Self-pay

## 2020-06-04 MED FILL — Losartan Potassium Tab 50 MG: ORAL | 90 days supply | Qty: 90 | Fill #0 | Status: AC

## 2020-06-05 ENCOUNTER — Other Ambulatory Visit (HOSPITAL_COMMUNITY): Payer: Self-pay

## 2020-06-08 ENCOUNTER — Other Ambulatory Visit (HOSPITAL_COMMUNITY): Payer: Self-pay

## 2020-06-11 NOTE — Telephone Encounter (Signed)
P.A. denied insurance covers Dexcom & pt is using Dexcom with no issues

## 2020-06-13 ENCOUNTER — Encounter: Payer: 59 | Admitting: Medical

## 2020-06-14 ENCOUNTER — Other Ambulatory Visit (HOSPITAL_COMMUNITY): Payer: Self-pay

## 2020-06-14 MED FILL — Continuous Glucose System Sensor: 30 days supply | Qty: 3 | Fill #0 | Status: AC

## 2020-06-20 ENCOUNTER — Other Ambulatory Visit (HOSPITAL_COMMUNITY)
Admission: RE | Admit: 2020-06-20 | Discharge: 2020-06-20 | Disposition: A | Discharge status: Home / Self Care | Payer: 59 | Source: Ambulatory Visit | Attending: Medical | Admitting: Medical

## 2020-06-20 ENCOUNTER — Other Ambulatory Visit: Payer: Self-pay

## 2020-06-20 ENCOUNTER — Ambulatory Visit (INDEPENDENT_AMBULATORY_CARE_PROVIDER_SITE_OTHER): Payer: 59 | Admitting: Medical

## 2020-06-20 ENCOUNTER — Encounter: Payer: Self-pay | Admitting: Medical

## 2020-06-20 VITALS — BP 138/80 | HR 78 | Ht 69.0 in | Wt 228.6 lb

## 2020-06-20 DIAGNOSIS — I10 Essential (primary) hypertension: Secondary | ICD-10-CM | POA: Diagnosis not present

## 2020-06-20 DIAGNOSIS — I5032 Chronic diastolic (congestive) heart failure: Secondary | ICD-10-CM

## 2020-06-20 DIAGNOSIS — G478 Other sleep disorders: Secondary | ICD-10-CM | POA: Insufficient documentation

## 2020-06-20 DIAGNOSIS — Z7185 Encounter for immunization safety counseling: Secondary | ICD-10-CM | POA: Diagnosis not present

## 2020-06-20 DIAGNOSIS — L602 Onychogryphosis: Secondary | ICD-10-CM | POA: Insufficient documentation

## 2020-06-20 DIAGNOSIS — Z0000 Encounter for general adult medical examination without abnormal findings: Secondary | ICD-10-CM | POA: Insufficient documentation

## 2020-06-20 DIAGNOSIS — K5909 Other constipation: Secondary | ICD-10-CM

## 2020-06-20 DIAGNOSIS — R0683 Snoring: Secondary | ICD-10-CM

## 2020-06-20 DIAGNOSIS — I25119 Atherosclerotic heart disease of native coronary artery with unspecified angina pectoris: Secondary | ICD-10-CM | POA: Diagnosis not present

## 2020-06-20 DIAGNOSIS — M2550 Pain in unspecified joint: Secondary | ICD-10-CM | POA: Insufficient documentation

## 2020-06-20 DIAGNOSIS — I252 Old myocardial infarction: Secondary | ICD-10-CM | POA: Diagnosis not present

## 2020-06-20 DIAGNOSIS — R5382 Chronic fatigue, unspecified: Secondary | ICD-10-CM | POA: Insufficient documentation

## 2020-06-20 DIAGNOSIS — R202 Paresthesia of skin: Secondary | ICD-10-CM

## 2020-06-20 DIAGNOSIS — Z23 Encounter for immunization: Secondary | ICD-10-CM | POA: Diagnosis not present

## 2020-06-20 DIAGNOSIS — Z87891 Personal history of nicotine dependence: Secondary | ICD-10-CM

## 2020-06-20 DIAGNOSIS — Z1231 Encounter for screening mammogram for malignant neoplasm of breast: Secondary | ICD-10-CM | POA: Insufficient documentation

## 2020-06-20 DIAGNOSIS — M79671 Pain in right foot: Secondary | ICD-10-CM | POA: Insufficient documentation

## 2020-06-20 DIAGNOSIS — R0989 Other specified symptoms and signs involving the circulatory and respiratory systems: Secondary | ICD-10-CM | POA: Insufficient documentation

## 2020-06-20 DIAGNOSIS — R400 Somnolence: Secondary | ICD-10-CM | POA: Insufficient documentation

## 2020-06-20 DIAGNOSIS — K219 Gastro-esophageal reflux disease without esophagitis: Secondary | ICD-10-CM

## 2020-06-20 DIAGNOSIS — E559 Vitamin D deficiency, unspecified: Secondary | ICD-10-CM | POA: Diagnosis not present

## 2020-06-20 DIAGNOSIS — E7849 Other hyperlipidemia: Secondary | ICD-10-CM

## 2020-06-20 DIAGNOSIS — S5411XS Injury of median nerve at forearm level, right arm, sequela: Secondary | ICD-10-CM

## 2020-06-20 DIAGNOSIS — E669 Obesity, unspecified: Secondary | ICD-10-CM

## 2020-06-20 DIAGNOSIS — E1169 Type 2 diabetes mellitus with other specified complication: Secondary | ICD-10-CM

## 2020-06-20 LAB — LIPID PANEL
Cholesterol: 139 mg/dL (ref 0–200)
HDL: 47 mg/dL (ref >40)
LDL Cholesterol: 76 mg/dL (ref 0–99)
Total CHOL/HDL Ratio: 3 RATIO
Triglycerides: 80 mg/dL (ref <150)
VLDL: 16 mg/dL (ref 0–40)

## 2020-06-20 LAB — COMPREHENSIVE METABOLIC PANEL
ALT: 32 U/L (ref 0–44)
AST: 30 U/L (ref 15–41)
Albumin: 4.8 g/dL (ref 3.5–5.0)
Alkaline Phosphatase: 82 U/L (ref 38–126)
Anion gap: 11 (ref 5–15)
BUN: 10 mg/dL (ref 6–20)
CO2: 21 mmol/L — ABNORMAL LOW (ref 22–32)
Calcium: 9.7 mg/dL (ref 8.9–10.3)
Chloride: 106 mmol/L (ref 98–111)
Creatinine, Ser: 0.87 mg/dL (ref 0.44–1.00)
GFR, Estimated: >60 mL/min (ref >60)
Glucose, Bld: 132 mg/dL — ABNORMAL HIGH (ref 70–99)
Potassium: 3.6 mmol/L (ref 3.5–5.1)
Sodium: 138 mmol/L (ref 135–145)
Total Bilirubin: 0.7 mg/dL (ref 0.3–1.2)
Total Protein: 8.7 g/dL — ABNORMAL HIGH (ref 6.5–8.1)

## 2020-06-20 LAB — CBC
HCT: 45.2 % (ref 36.0–46.0)
Hemoglobin: 14.4 g/dL (ref 12.0–15.0)
MCH: 31.2 pg (ref 26.0–34.0)
MCHC: 31.9 g/dL (ref 30.0–36.0)
MCV: 97.8 fL (ref 80.0–100.0)
Platelets: 195 10*3/uL (ref 150–400)
RBC: 4.62 MIL/uL (ref 3.87–5.11)
RDW: 12.9 % (ref 11.5–15.5)
WBC: 9.3 10*3/uL (ref 4.0–10.5)
nRBC: 0 % (ref 0.0–0.2)

## 2020-06-20 LAB — VITAMIN D 25 HYDROXY (VIT D DEFICIENCY, FRACTURES): Vit D, 25-Hydroxy: 22.61 ng/mL — ABNORMAL LOW (ref 30–100)

## 2020-06-20 LAB — SEDIMENTATION RATE: Sed Rate: 30 mm/hr — ABNORMAL HIGH (ref 0–22)

## 2020-06-20 LAB — URIC ACID: Uric Acid, Serum: 4.1 mg/dL (ref 2.5–7.1)

## 2020-06-20 LAB — HEMOGLOBIN A1C
Hgb A1c MFr Bld: 7.6 % — ABNORMAL HIGH (ref 4.8–5.6)
Mean Plasma Glucose: 171.42 mg/dL

## 2020-06-20 LAB — TSH: TSH: 0.795 u[IU]/mL (ref 0.350–4.500)

## 2020-06-20 NOTE — Progress Notes (Signed)
Subjective:   HPI  Jennifer Cervantes is a 40 y.o. female who presents for Chief Complaint  Patient presents with  . Annual Exam    Cpe with fasting labs and refills are needed on all meds except heart meds.     Patient Care Team: Donnisha Besecker, Camelia Eng, PA-C as PCP - General (Family Medicine) Herminio Commons, MD (Inactive) as PCP - Cardiology (Cardiology) Sees dentist Sees eye doctor Dr. Nat Math, gynecology [facility] Dr. Marcial Pacas and Noberto Retort, NP, neurology Dr. Christinia Gully, pulmonology Gastroenterology? Prior ortho in [place]?   Concerns: Diabetes-using Dexcom.  Blood sugars fluctuate quite a bit but typically fasting is less than 140.  No foot lesions.  Has seen eye doctor in the last year.  She has seen her cardiologist in the past year.  Has seen gynecology in the past year.  Pap smear up-to-date this past year.  She still has not gotten the mammogram though  She had her COVID vaccines.  Has chronic constipation, ended up seeing gastroenterology this past year.  They prescribed some medication but still the nail.  No prior colonoscopy  She gets pains in her right foot particular over the Achilles area.  No injury.  This is chronic  She notes feeling tired a lot.  She does snore.  She does note daytime somnolence.  No obvious witnessed apnea but does not feel rested when she gets up.  Her body aches often.  She has pains in her knees, pains in her right hand, pains in her right foot.  No joint swelling specifically.  She endorses morning stiffness at times.  No family history of rheumatoid disease or lupus   Reviewed their medical, surgical, family, social, medication, and allergy history and updated chart as appropriate.  Past Medical History:  Diagnosis Date  . CAD (coronary artery disease)    a. LHC on 06/21/15 which showed 75% occl mid-dist LCx, 40% occl mRCA, 99% mid-dist LAD s/p DES and 50% occl mLAD s/p DES (overlapping stents) and  significant LV dysfunction with anteroapical HK; b. 06/2015 Myoview: EF 55-65%, no ischemia/infarct.  . CHF (congestive heart failure) ([place])   . GERD (gastroesophageal reflux disease)   . Hyperlipidemia   . Hypertension   . Hypothyroidism   . Myocardial stunning    a. EF 45% on 2D ECHO on 06/21/15 and severe LV dysfunction on LV gram by cath. Repeat 2D ECHO with EF 55-60% and mild HK of the apicoseptal myocardium.  [name] Neuropraxia of right median nerve    a. suspected after right radial cath. will follow with neuro outpatient if does not resolve.   . NSTEMI (non-ST elevated myocardial infarction) [facility]) 05/2015   [facility]  . Obesity   . 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 05/2015 Following PCI (radial access)-->conservatively managed.  . Tobacco abuse   . Tricuspid regurgitation   . Type II diabetes mellitus ([place])    a. Type II  . Vitamin D deficiency     Past Surgical History:  Procedure Laterality Date  . [place] STUDY N/A 07/14/2016   Procedure: [place] STUDY;  Surgeon: Mauri Pole, MD;  Location: [facility];  Service: Endoscopy;  Laterality: N/A;  . CARDIAC CATHETERIZATION N/A 06/21/2015   Procedure: Left Heart Cath and Coronary Angiography;  Surgeon: Belva Crome, MD; pLAD 99%, CFX 75%, RCA 40%, EF 35%   . CARDIAC CATHETERIZATION N/A 06/21/2015   Procedure: Coronary Stent Intervention;  Surgeon: Belva Crome, MD;  Promus Premier DES, 3.0 x 20 and 3.5 x 32 mm stents to the LAD  . CARDIAC CATHETERIZATION N/A 06/21/2015   Procedure: Intravascular Ultrasound/IVUS;  Surgeon: Belva Crome, MD;  Location: [place] CV LAB;  Service: Cardiovascular;  Laterality: N/A;  LAD  . CARDIAC CATHETERIZATION  06/22/2015   Procedure: Coronary/Graft Angiography;  Surgeon: Peter M Martinique, MD;  Location: [place] CV LAB;  Service: Cardiovascular;;  . CORONARY STENT INTERVENTION N/A 04/02/2016   Procedure: Coronary Stent Intervention;  Surgeon: Jettie Booze, MD;  Location:  [place] CV LAB;  Service: Cardiovascular;  Laterality: N/A;  . ESOPHAGEAL MANOMETRY N/A 07/14/2016   Procedure: ESOPHAGEAL MANOMETRY (EM);  Surgeon: Mauri Pole, MD;  Location: [facility];  Service: Endoscopy;  Laterality: N/A;  . LEFT HEART CATH AND CORONARY ANGIOGRAPHY N/A 04/02/2016   Procedure: Left Heart Cath and Coronary Angiography;  Surgeon: Jettie Booze, MD;  Location: [place] CV LAB;  Service: Cardiovascular;  Laterality: N/A;  . LEFT HEART CATH AND CORONARY ANGIOGRAPHY N/A 07/17/2016   Procedure: Left Heart Cath and Coronary Angiography;  Surgeon: Leonie Man, MD;  Location: [place] [facility] LAB;  Service: Cardiovascular;  Laterality: N/A;    Social History   Socioeconomic History  . Marital status: Single    Spouse name: Not on file  . Number of children: 4  . Years of education: 70  . Highest education level: Not on file  Occupational History  . Occupation: Forensic psychologist: [employer]  Tobacco Use  . Smoking status: Current Every Day Smoker    Packs/day: 0.50    Years: 23.00    Pack years: 11.50    Types: Cigarettes    Last attempt to quit: 04/02/2015    Years since quitting: 5.2  . Smokeless tobacco: Never Used  Vaping Use  . Vaping Use: Never used  Substance and Sexual Activity  . Alcohol use: No    Alcohol/week: 0.0 standard drinks  . Drug use: No  . Sexual activity: Not on file  Other Topics Concern  . Not on file  Social History Narrative   Lives in [place], works nights at [employer], [place]   Right-handed   Drinks 1 soda a day   Exercises some with walking   Lives with her son   Laverle Hobby her space, doesn't want a significant other   05/2019   Social Determinants of Health   Financial Resource Strain: Not on file  Food Insecurity: Not on file  Transportation Needs: Not on file  Physical Activity: Not on file  Stress: Not on file  Social Connections: Not on file  Intimate Partner Violence: Not on file    Family History  Problem  Relation Age of Onset  . Diabetes Mother   . Healthy Father   . Healthy Sister   . Healthy Brother   . Diabetes Maternal Grandmother   . Heart disease Maternal Grandmother   . Diabetes Maternal Aunt        x 2  . Heart attack Neg Hx   . Heart failure Neg Hx   . Stroke Neg Hx   . Hyperlipidemia Neg Hx   . Hypertension Neg Hx   . Stomach cancer Neg Hx   . Colon cancer Neg Hx      Current Outpatient Medications:  .  acetaminophen (TYLENOL) 500 MG tablet, Take 500 mg by mouth every 6 (six) hours as needed for headache (  pain)., Disp: , Rfl:  .  cholecalciferol (VITAMIN D3) 25 MCG (1000 UNIT) tablet, Take 1 tablet (1,000 Units total) by mouth daily., Disp: 90 tablet, Rfl: 3 .  Continuous Blood Gluc Receiver (DEXCOM G6 RECEIVER) DEVI, USE AS DIRECTED, Disp: 1 each, Rfl: 0 .  Continuous Blood Gluc Sensor (DEXCOM G6 SENSOR) MISC, CHECK GLUCOSE 2-3 TIMES A DAY, Disp: 3 each, Rfl: 11 .  Continuous Blood Gluc Sensor (FREESTYLE LIBRE 14 DAY SENSOR) MISC, USE AS DIRECTED, Disp: 2 each, Rfl: 6 .  Continuous Blood Gluc Transmit (DEXCOM G6 TRANSMITTER) MISC, USE AS DIRECTED, Disp: 1 each, Rfl: 4 .  linaclotide (LINZESS) 145 MCG CAPS capsule, Take 1 capsule (145 mcg total) by mouth daily before breakfast., Disp: 90 capsule, Rfl: 1 .  losartan (COZAAR) 50 MG tablet, TAKE 1 TABLET (50 MG TOTAL) BY MOUTH DAILY., Disp: 90 tablet, Rfl: 3 .  nitroGLYCERIN (NITROSTAT) 0.4 MG SL tablet, Place 1 tablet (0.4 mg total) under the tongue every 5 (five) minutes as needed for chest pain., Disp: 30 tablet, Rfl: 6 .  rivaroxaban (XARELTO) 2.5 MG TABS tablet, TAKE 1 TABLET BY MOUTH 2 TIMES DAILY., Disp: 60 tablet, Rfl: 6 .  metoprolol tartrate (LOPRESSOR) 25 MG tablet, Take 1 tablet (25 mg total) by mouth 2 (two) times daily. (Patient not taking: Reported on 06/20/2020), Disp: 60 tablet, Rfl: 6 .  oxyCODONE (OXY IR/ROXICODONE) 5 MG immediate release tablet, Take 5 mg by mouth every 4 (four) hours as needed for  breakthrough pain. (Patient not taking: Reported on 06/20/2020), Disp: , Rfl:  .  promethazine (PHENERGAN) 12.5 MG tablet, Take 1 tablet (12.5 mg total) by mouth every 6 (six) hours as needed for nausea or vomiting. (Patient not taking: Reported on 06/20/2020), Disp: 15 tablet, Rfl: 0 .  rosuvastatin (CRESTOR) 40 MG tablet, TAKE 1 TABLET (40 MG TOTAL) BY MOUTH DAILY., Disp: 90 tablet, Rfl: 3  No Known Allergies   Review of Systems Constitutional: -fever, -chills, -sweats, -unexpected weight change, -decreased appetite, +fatigue Allergy: -sneezing, -itching, -congestion Dermatology: -changing moles, --rash, -lumps ENT: -runny nose, -ear pain, -sore throat, -hoarseness, -sinus pain, -teeth pain, - ringing in ears, -hearing loss, -nosebleeds Cardiology: +chest pain, -palpitations, -swelling, -difficulty breathing when lying flat, -waking up short of breath Respiratory: -cough, -shortness of breath, -difficulty breathing with exercise or exertion, -wheezing, -coughing up blood Gastroenterology: -abdominal pain, -nausea, -vomiting, -diarrhea, +constipation, -blood in stool, -changes in bowel movement, -difficulty swallowing or eating Hematology: -bleeding, -bruising  Musculoskeletal: +joint aches, +muscle aches, -joint swelling, -back pain, -neck pain, -cramping, -changes in gait Ophthalmology: denies vision changes, eye redness, itching, discharge Urology: -burning with urination, -difficulty urinating, -blood in urine, -urinary frequency, -urgency, -incontinence Neurology: -headache, -weakness, -tingling, -numbness, -memory loss, -falls, -dizziness Psychology: -depressed mood, -agitation, +sleep problems Breast/gyn: -breast tendnerss, -discharge, -lumps, -vaginal discharge,- irregular periods, -heavy periods     Objective:  BP 138/80   Pulse 78   Ht 5\' 9"  (1.753 m)   Wt 228 lb 9.6 oz (103.7 kg)   SpO2 96%   BMI 33.76 kg/m   General appearance: alert, no distress, WD/WN, African American  female Skin: no worrisome lesions HEENT and oral unremarkable, teeth in good repair, no lesions, no small airway or tonsillar hypertrophy Neck: supple, no lymphadenopathy, no thyromegaly, no masses, normal ROM, no bruits Chest: non tender, normal shape and expansion Heart: RRR, normal S1, S2, no murmurs Lungs: CTA bilaterally, no wheezes, rhonchi, or rales Abdomen: +bs, soft, non tender, non distended, no masses, no  hepatomegaly, no splenomegaly, no bruits Back: non tender, normal ROM, no scoliosis Musculoskeletal:  upper extremities non tender, no obvious deformity, normal ROM throughout, lower extremities non tender, no obvious deformity, normal ROM throughout Extremities: no edema, no cyanosis, no clubbing Pulses: Unable to palpate good right radial pulse, otherwise 2+ symmetric, upper and lower extremities, normal cap refill Neurological: alert, oriented x 3, CN2-12 intact, strength normal upper extremities and lower extremities, sensation normal throughout, DTRs 2+ throughout, no cerebellar signs, gait normal Psychiatric: normal affect, behavior normal, pleasant  Breast/gyn/rectal - deferred to gynecology    Diabetic Foot Exam - Simple   Simple Foot Form Visual Inspection See comments: Yes Sensation Testing Intact to touch and monofilament testing bilaterally: Yes Pulse Check See comments: Yes Comments Pedal pulses somewhat decreased, somewhat thickened toenails bilaterally, worse on left      Assessment and Plan :   Encounter Diagnoses  Name Primary?  . Encounter for health maintenance examination in adult Yes  . History of non-ST elevation myocardial infarction (NSTEMI)   . Other hyperlipidemia   . Neurapraxia of right median nerve, sequela   . Paresthesia   . Snoring   . Vaccine counseling   . Vitamin D deficiency   . Chronic diastolic heart failure ([place])   . Coronary artery disease involving native coronary artery of native heart with angina pectoris ([place])   .  Essential hypertension   . Former smoker   . Gastroesophageal reflux disease, unspecified whether esophagitis present   . Chronic constipation   . Encounter for screening mammogram for malignant neoplasm of breast   . Non-restorative sleep   . Daytime somnolence   . Polyarthralgia   . Right foot pain   . Chronic fatigue   . Diabetes mellitus type 2 in obese ([place])   . Serum calcium elevated   . Hypertrophic toenail   . Decreased pulses in feet     Physical exam - discussed and counseled on healthy lifestyle, diet, exercise, preventative care, vaccinations, sick and well care, proper use of emergency dept and after hours care, and addressed their concerns.    Health screening: Advised they see their eye doctor yearly for routine vision care. Advised they see their dentist yearly for routine dental care including hygiene visits twice yearly. See your gynecologist yearly for routine gynecological care.   Vaccines: You report having COVID vaccines up-to-date Your tetanus vaccine is up-to-date Get a yearly flu shot in the fall Counseled on the pneumococcal vaccine.  Vaccine information sheet given.  Pneumococcal vaccine PPSV23 given after consent obtained.    Cancer screening: I recommend a baseline mammogram  I recommend pap smear every 1-3 years depending upon results.  We will request copy of your last Pap smear  Discussed skin surveillance.  Discussed routine labs    Separate significant chronic issues discussed: Snoring, fatigue, sleep disturbance-referral for sleep study  Fatigue-labs today as below  Chronic pain, joint pain, polyarthralgia- we discussed possible causes, different between osteoarthritis, rheumatoid arthritis and other issues such as fibromyalgia or lupus.  Baseline screen today.  Consider rheumatoid consult if positive labs  Diabetes  Exercise regularly  Check feet daily for sores or wounds and let us know if something is not healing  appropriately  Continue Dexcom glucose monitoring.  Goal was less than 130 sugars before meals, less than 200 postprandial 2 hours after a meal  Continue current medications  See me every 4 months for diabetes, not just once yearly  Hyperlipidemia-continue cholesterol medication  High blood pressure-continue blood pressure medication  History of heart disease-follow-up with cardiology as scheduled  Chronic constipation-we will request prior gastroenterology notes from last year  Elevated calcium last year- we initially recommended a follow-up in 3 months.  We are just now seeing you a year later.  We will check a parathyroid hormone lab and repeat calcium level today  Vitamin D deficiency-continue vitamin D supplement  Foot pain, joint aches, thickened toenails-consider podiatry consult  Decreased pulses in feet-consider ABI blood flow screen  Larysa was seen today for annual exam.  Diagnoses and all orders for this visit:  Encounter for health maintenance examination in adult -     Comprehensive metabolic panel -     CYCLIC CITRUL PEPTIDE ANTIBODY, IGG/IGA -     CBC -     Lipid panel -     Hemoglobin A1c -     Microalbumin/Creatinine Ratio, Urine -     Sedimentation rate -     Uric acid -     Parathyroid hormone, intact (no Ca) -     VITAMIN D 25 Hydroxy (Vit-D Deficiency, Fractures) -     TSH  History of non-ST elevation myocardial infarction (NSTEMI) -     Lipid panel  Other hyperlipidemia -     Lipid panel  Neurapraxia of right median nerve, sequela  Paresthesia  Snoring  Vaccine counseling  Vitamin D deficiency -     VITAMIN D 25 Hydroxy (Vit-D Deficiency, Fractures)  Chronic diastolic heart failure (HCC)  Coronary artery disease involving native coronary artery of native heart with angina pectoris (HCC)  Essential hypertension -     Comprehensive metabolic panel  Former smoker  Gastroesophageal reflux disease, unspecified whether esophagitis  present  Chronic constipation  Encounter for screening mammogram for malignant neoplasm of breast  Non-restorative sleep  Daytime somnolence  Polyarthralgia -     CYCLIC CITRUL PEPTIDE ANTIBODY, IGG/IGA -     Sedimentation rate -     Uric acid  Right foot pain  Chronic fatigue -     VITAMIN D 25 Hydroxy (Vit-D Deficiency, Fractures) -     TSH  Diabetes mellitus type 2 in obese (HCC) -     Hemoglobin A1c -     Microalbumin/Creatinine Ratio, Urine  Serum calcium elevated -     Parathyroid hormone, intact (no Ca)  Hypertrophic toenail  Decreased pulses in feet  Other orders -     Pneumococcal polysaccharide vaccine 23-valent greater than or equal to 2yo subcutaneous/IM   Follow-up pending labs, every 4 to 6 months for diabetes, yearly for physical

## 2020-06-21 ENCOUNTER — Other Ambulatory Visit: Payer: Self-pay | Admitting: Medical

## 2020-06-21 ENCOUNTER — Other Ambulatory Visit (HOSPITAL_COMMUNITY): Payer: Self-pay

## 2020-06-21 DIAGNOSIS — Z1231 Encounter for screening mammogram for malignant neoplasm of breast: Secondary | ICD-10-CM

## 2020-06-21 LAB — MICROALBUMIN / CREATININE URINE RATIO
Creatinine, Urine: 113.2 mg/dL
Microalb Creat Ratio: 15 mg/g creat (ref 0–29)
Microalb, Ur: 16.7 ug/mL — ABNORMAL HIGH

## 2020-06-21 MED ORDER — VITAMIN D 25 MCG (1000 UNIT) PO TABS
2000.0000 [IU] | ORAL_TABLET | Freq: Every day | ORAL | 3 refills | Status: DC
  Filled 2020-06-21 – 2021-03-29 (×2): qty 180, 90d supply, fill #0

## 2020-06-21 MED ORDER — ROSUVASTATIN CALCIUM 40 MG PO TABS
40.0000 mg | ORAL_TABLET | Freq: Every day | ORAL | 3 refills | Status: DC
  Filled 2020-06-21: qty 90, 90d supply, fill #0
  Filled 2020-10-15: qty 90, 90d supply, fill #1
  Filled 2021-02-07: qty 90, 90d supply, fill #2
  Filled 2021-03-29 – 2021-05-17 (×2): qty 90, 90d supply, fill #3

## 2020-06-21 MED ORDER — METFORMIN HCL 500 MG PO TABS
500.0000 mg | ORAL_TABLET | Freq: Two times a day (BID) | ORAL | 1 refills | Status: DC
  Filled 2020-06-21: qty 180, 90d supply, fill #0

## 2020-06-21 MED ORDER — METOPROLOL TARTRATE 25 MG PO TABS
25.0000 mg | ORAL_TABLET | Freq: Two times a day (BID) | ORAL | 0 refills | Status: DC
  Filled 2020-06-21: qty 180, 90d supply, fill #0

## 2020-06-21 MED ORDER — LOSARTAN POTASSIUM 50 MG PO TABS
50.0000 mg | ORAL_TABLET | Freq: Every day | ORAL | 3 refills | Status: DC
  Filled 2020-06-21 – 2020-09-16 (×2): qty 90, 90d supply, fill #0
  Filled 2021-02-07: qty 90, 90d supply, fill #1
  Filled 2021-03-29 – 2021-05-17 (×2): qty 90, 90d supply, fill #2

## 2020-06-22 ENCOUNTER — Other Ambulatory Visit: Payer: Self-pay

## 2020-06-22 ENCOUNTER — Other Ambulatory Visit (HOSPITAL_COMMUNITY): Payer: Self-pay

## 2020-06-22 LAB — PARATHYROID HORMONE, INTACT (NO CA): PTH: 20 pg/mL (ref 15–65)

## 2020-06-22 LAB — CYCLIC CITRUL PEPTIDE ANTIBODY, IGG/IGA: CCP Antibodies IgG/IgA: 7 units (ref 0–19)

## 2020-06-22 MED ORDER — RIVAROXABAN 2.5 MG PO TABS
2.5000 mg | ORAL_TABLET | Freq: Two times a day (BID) | ORAL | 6 refills | Status: DC
  Filled 2020-06-22: qty 60, 30d supply, fill #0
  Filled 2020-09-16: qty 60, 30d supply, fill #1
  Filled 2021-02-07: qty 60, 30d supply, fill #2
  Filled 2021-03-29: qty 60, 30d supply, fill #3

## 2020-06-22 MED ORDER — METOPROLOL TARTRATE 25 MG PO TABS
25.0000 mg | ORAL_TABLET | Freq: Two times a day (BID) | ORAL | 1 refills | Status: DC
  Filled 2020-06-22 – 2021-02-07 (×2): qty 180, 90d supply, fill #0
  Filled 2021-03-29: qty 180, 90d supply, fill #1

## 2020-06-22 NOTE — Telephone Encounter (Signed)
06/22/2020 4:40 PM Reply   To: [facility] TRIAGE    From: Adline Potter    Created: 06/22/2020 4:40 PM     *-*-*This message has not been handled.*-*-*  Good afternoon ! I need refills on xarelto 2.5mg  and metoprolol tartare 25mg  thanks you     Refilled to [facility]

## 2020-06-26 ENCOUNTER — Telehealth: Payer: Self-pay | Admitting: Medical

## 2020-06-26 MED ORDER — SYNJARDY 5-1000 MG PO TABS
1.0000 | ORAL_TABLET | Freq: Two times a day (BID) | ORAL | 1 refills | Status: DC
  Filled 2020-06-26 – 2020-07-09 (×2): qty 180, 90d supply, fill #0
  Filled 2021-03-29: qty 180, 90d supply, fill #1

## 2020-06-26 NOTE — Telephone Encounter (Signed)
Somehow then Jennifer Cervantes was missed on triage.  She emailed back that she was on Synjardy, but NO diabetes medications were listed at triage.  Thus, DO NOT started plain metformin, continue Synjardy.

## 2020-06-27 ENCOUNTER — Other Ambulatory Visit (HOSPITAL_COMMUNITY): Payer: Self-pay

## 2020-06-27 NOTE — Telephone Encounter (Signed)
Patient advised.

## 2020-07-06 ENCOUNTER — Other Ambulatory Visit (HOSPITAL_COMMUNITY): Payer: Self-pay

## 2020-07-06 ENCOUNTER — Other Ambulatory Visit: Payer: Self-pay

## 2020-07-06 MED ORDER — ASPIRIN 81 MG PO TBEC
81.0000 mg | DELAYED_RELEASE_TABLET | Freq: Every day | ORAL | 1 refills | Status: DC
  Filled 2020-07-06: qty 90, 90d supply, fill #0
  Filled 2020-11-18: qty 90, 90d supply, fill #1

## 2020-07-09 ENCOUNTER — Other Ambulatory Visit (HOSPITAL_COMMUNITY): Payer: Self-pay

## 2020-08-16 ENCOUNTER — Other Ambulatory Visit: Payer: Self-pay | Admitting: Medical

## 2020-08-16 DIAGNOSIS — Z1231 Encounter for screening mammogram for malignant neoplasm of breast: Secondary | ICD-10-CM

## 2020-08-21 ENCOUNTER — Ambulatory Visit: Payer: 59

## 2020-08-30 MED FILL — Continuous Glucose System Sensor: 30 days supply | Qty: 3 | Fill #1 | Status: AC

## 2020-08-31 ENCOUNTER — Other Ambulatory Visit (HOSPITAL_COMMUNITY): Payer: Self-pay

## 2020-09-03 ENCOUNTER — Other Ambulatory Visit (HOSPITAL_COMMUNITY): Payer: Self-pay

## 2020-09-03 MED FILL — Continuous Glucose System Transmitter: 90 days supply | Qty: 1 | Fill #0 | Status: AC

## 2020-09-17 ENCOUNTER — Other Ambulatory Visit (HOSPITAL_COMMUNITY): Payer: Self-pay

## 2020-10-03 ENCOUNTER — Encounter: Payer: Self-pay | Admitting: Internal Medicine

## 2020-10-10 ENCOUNTER — Other Ambulatory Visit: Payer: Self-pay

## 2020-10-10 ENCOUNTER — Ambulatory Visit
Admission: RE | Admit: 2020-10-10 | Discharge: 2020-10-10 | Disposition: A | Discharge status: Home / Self Care | Payer: 59 | Source: Ambulatory Visit | Attending: Medical | Admitting: Medical

## 2020-10-10 DIAGNOSIS — Z1231 Encounter for screening mammogram for malignant neoplasm of breast: Secondary | ICD-10-CM | POA: Diagnosis not present

## 2020-10-10 LAB — HM MAMMOGRAPHY

## 2020-10-11 ENCOUNTER — Encounter: Payer: Self-pay | Admitting: Internal Medicine

## 2020-10-12 ENCOUNTER — Other Ambulatory Visit: Payer: Self-pay | Admitting: Medical

## 2020-10-12 DIAGNOSIS — R928 Other abnormal and inconclusive findings on diagnostic imaging of breast: Secondary | ICD-10-CM

## 2020-10-15 ENCOUNTER — Other Ambulatory Visit (HOSPITAL_COMMUNITY): Payer: Self-pay

## 2020-10-15 MED FILL — Continuous Glucose System Sensor: 30 days supply | Qty: 3 | Fill #2 | Status: AC

## 2020-10-20 ENCOUNTER — Ambulatory Visit: Admission: EM | Admit: 2020-10-20 | Discharge: 2020-10-20 | Discharge status: Against Medical Advice | Payer: 59

## 2020-10-20 ENCOUNTER — Other Ambulatory Visit: Payer: Self-pay

## 2020-10-20 DIAGNOSIS — M25562 Pain in left knee: Secondary | ICD-10-CM | POA: Diagnosis not present

## 2020-10-31 ENCOUNTER — Other Ambulatory Visit: Payer: Self-pay

## 2020-10-31 ENCOUNTER — Other Ambulatory Visit: Payer: Self-pay | Admitting: Medical

## 2020-10-31 ENCOUNTER — Ambulatory Visit
Admission: RE | Admit: 2020-10-31 | Discharge: 2020-10-31 | Disposition: A | Discharge status: Home / Self Care | Payer: 59 | Source: Ambulatory Visit | Attending: Medical | Admitting: Medical

## 2020-10-31 ENCOUNTER — Telehealth: Payer: Self-pay | Admitting: Medical

## 2020-10-31 ENCOUNTER — Ambulatory Visit (INDEPENDENT_AMBULATORY_CARE_PROVIDER_SITE_OTHER): Payer: 59 | Admitting: Medical

## 2020-10-31 VITALS — BP 120/80 | HR 78 | Temp 97.2°F | Wt 226.4 lb

## 2020-10-31 DIAGNOSIS — M7660 Achilles tendinitis, unspecified leg: Secondary | ICD-10-CM | POA: Diagnosis not present

## 2020-10-31 DIAGNOSIS — R928 Other abnormal and inconclusive findings on diagnostic imaging of breast: Secondary | ICD-10-CM

## 2020-10-31 DIAGNOSIS — M25562 Pain in left knee: Secondary | ICD-10-CM | POA: Diagnosis not present

## 2020-10-31 DIAGNOSIS — R922 Inconclusive mammogram: Secondary | ICD-10-CM | POA: Diagnosis not present

## 2020-10-31 DIAGNOSIS — N6011 Diffuse cystic mastopathy of right breast: Secondary | ICD-10-CM | POA: Diagnosis not present

## 2020-10-31 NOTE — Progress Notes (Signed)
Subjective: Chief Complaint  Patient presents with   Knee Pain    Left knee pain x 2.5 weeks and right ankle pain on and off.     Here for pains  Left knee pain and right ankle pain.  Denies fall, injury, trauma.   Feels like knee gives out with walking.  She notes left knee pain x 2.5 weeks.   Sometimes seems swollen.   No recent activity to contribute to the pain.  No exercise currently.    Right ankle pain for months.  Denies any injury or fall or trauma.  Hurts on back of heel.   No swelling of ankle.     Has used some ice for the issues.   Hasn't taken any medication for this.  She saw urgent care for the knee issue last week after her knee gave out.  They gave her a brace and sent her home.  She notes that the brace seems too small so she can't even use this.  She notes that she is not exercising currently.  She works sitting at a desk all day.  Says she does not feel like there has been any activity to contribute to the symptoms  No other aggravating or relieving factors. No other complaint.   Past Medical History:  Diagnosis Date   CAD (coronary artery disease)    a. LHC on 06/21/15 which showed 75% occl mid-dist LCx, 40% occl mRCA, 99% mid-dist LAD s/p DES and 50% occl mLAD s/p DES (overlapping stents) and significant LV dysfunction with anteroapical HK; b. 06/2015 Myoview: EF 55-65%, no ischemia/infarct.   CHF (congestive heart failure) (HCC)    GERD (gastroesophageal reflux disease)    Hyperlipidemia    Hypertension    Hypothyroidism    Myocardial stunning    a. EF 45% on 2D ECHO on 06/21/15 and severe LV dysfunction on LV gram by cath. Repeat 2D ECHO with EF 55-60% and mild HK of the apicoseptal myocardium.   Neuropraxia of right median nerve    a. suspected after right radial cath. will follow with neuro outpatient if does not resolve.    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  05/2015 Following PCI (radial access)-->conservatively managed.   Tobacco abuse    Tricuspid regurgitation    Type II diabetes mellitus ([place])    a. Type II   Vitamin D deficiency     Current Outpatient Medications on File Prior to Visit  Medication Sig Dispense Refill   acetaminophen (TYLENOL) 500 MG tablet Take 500 mg by mouth every 6 (six) hours as needed for headache (pain).     aspirin 81 MG EC tablet Take 1 tablet (81 mg total) by mouth daily. 90 tablet 1   cholecalciferol (VITAMIN D3) 25 MCG (1000 UNIT) tablet Take 2 tablets (2,000 Units total) by mouth daily. 180 tablet 3   Continuous Blood Gluc Receiver (DEXCOM G6 RECEIVER) DEVI USE AS DIRECTED 1 each 0   Continuous Blood Gluc Sensor (DEXCOM G6 SENSOR) MISC CHECK GLUCOSE 2-3 TIMES A DAY 3 each 11   Continuous Blood Gluc Transmit (DEXCOM G6 TRANSMITTER) MISC USE AS DIRECTED 1 each 4   Empagliflozin-metFORMIN HCl (SYNJARDY) 06-998 MG TABS Take 1 tablet by mouth 2 (two) times daily. 180 tablet 1   losartan (COZAAR) 50 MG tablet Take 1 tablet (50 mg total) by mouth daily. 90 tablet 3  metoprolol tartrate (LOPRESSOR) 25 MG tablet Take 1 tablet (25 mg total) by mouth 2 (two) times daily. 180 tablet 1   nitroGLYCERIN (NITROSTAT) 0.4 MG SL tablet Place 1 tablet (0.4 mg total) under the tongue every 5 (five) minutes as needed for chest pain. 30 tablet 6   rivaroxaban (XARELTO) 2.5 MG TABS tablet Take 1 tablet (2.5 mg total) by mouth 2 (two) times daily. 60 tablet 6   rosuvastatin (CRESTOR) 40 MG tablet Take 1 tablet (40 mg total) by mouth daily. 90 tablet 3   No current facility-administered medications on file prior to visit.   ROS as in subjective   Objective BP 120/80   Pulse 78   Temp (!) 97.2 F (36.2 C)   Wt 226 lb 6.4 oz (102.7 kg)   BMI 33.43 kg/m   Gen: wd, wn, nad Right foot tender over the Achilles tendon insertion, otherwise foot and lower leg nontender, no swelling, no deformity, normal range of motion. Left knee  tender over the patellar tendon.  Mild pain with McMurray's but no click.  Tender with leg range of motion at the patellar tendon.  Otherwise no other deformity, no tenderness, no swelling Legs neurovascularly intact    Assessment: Encounter Diagnoses  Name Primary?   Achilles tendon pain Yes   Patellar tenderness, left      Plan: Exam findings suggest left patellar tendinitis and right Achilles tendinitis.  She has not really had activity that would have obvious contribution to those symptoms.  We will go ahead and refer to physical therapy for additional treatment recommendations and assessment.  We discussed possibly doing a right cam walker for the Achilles tendinitis to rest the foot as well as left knee cho path.  She will consider.  We discussed ice, relative rest, topical anti-inflammatory cream or Tylenol oral for pain  Follow-up pending call back about referral  Maxine was seen today for knee pain.  Diagnoses and all orders for this visit:  Achilles tendon pain  Patellar tenderness, left  F/u pending call back

## 2020-10-31 NOTE — Telephone Encounter (Signed)
Please call one of these physical therapy offices in [place], [place] where she lives.  I would like to get her in to therapy preferably before the end of the week.  So I want to see which 1 of these offices can work her in that quickly or by Monday.  The second question is whether the physical therapy office would fit someone for a brace or splint.  I talked her today about possibly doing a cam walker or other brace for her achilles tendonitis and chopath brace for her left knee.  Physical exam to the office can help with that and I will not send her anywhere else for those devices..  Please let me know  [facility] in [place] [phone]  [facility] [phone]  [facility] [phone]

## 2020-10-31 NOTE — Telephone Encounter (Signed)
Faxed over to [facility] [phone]

## 2020-10-31 NOTE — Telephone Encounter (Signed)
Pt is scheduled for Physical Therapy on Monday at 4pm. They do not do braces for knee

## 2020-11-06 ENCOUNTER — Ambulatory Visit
Admission: RE | Admit: 2020-11-06 | Discharge: 2020-11-06 | Disposition: A | Discharge status: Home / Self Care | Payer: 59 | Source: Ambulatory Visit | Attending: Medical | Admitting: Medical

## 2020-11-06 ENCOUNTER — Other Ambulatory Visit: Payer: Self-pay

## 2020-11-06 DIAGNOSIS — R928 Other abnormal and inconclusive findings on diagnostic imaging of breast: Secondary | ICD-10-CM

## 2020-11-06 DIAGNOSIS — N6313 Unspecified lump in the right breast, lower outer quadrant: Secondary | ICD-10-CM | POA: Diagnosis not present

## 2020-11-06 DIAGNOSIS — D241 Benign neoplasm of right breast: Secondary | ICD-10-CM | POA: Diagnosis not present

## 2020-11-07 DIAGNOSIS — M25671 Stiffness of right ankle, not elsewhere classified: Secondary | ICD-10-CM | POA: Diagnosis not present

## 2020-11-07 DIAGNOSIS — M25562 Pain in left knee: Secondary | ICD-10-CM | POA: Diagnosis not present

## 2020-11-07 DIAGNOSIS — M25571 Pain in right ankle and joints of right foot: Secondary | ICD-10-CM | POA: Diagnosis not present

## 2020-11-07 DIAGNOSIS — M6281 Muscle weakness (generalized): Secondary | ICD-10-CM | POA: Diagnosis not present

## 2020-11-07 DIAGNOSIS — R262 Difficulty in walking, not elsewhere classified: Secondary | ICD-10-CM | POA: Diagnosis not present

## 2020-11-15 ENCOUNTER — Ambulatory Visit: Payer: Self-pay | Admitting: General Surgery

## 2020-11-15 ENCOUNTER — Telehealth: Payer: Self-pay | Admitting: *Deleted

## 2020-11-15 DIAGNOSIS — D241 Benign neoplasm of right breast: Secondary | ICD-10-CM | POA: Diagnosis not present

## 2020-11-15 NOTE — Telephone Encounter (Signed)
   [facility] Pre-operative Risk Assessment    Patient Name: KENNDRA MORRIS  DOB: Jun 11, 1980 MRN: [id number]  HEARTCARE STAFF:  - IMPORTANT!!!!!! Under Visit Info/Reason for Call, type in Other and utilize the format Clearance MM/DD/YY or Clearance TBD. Do not use dashes or single digits. - Please review there is not already an duplicate clearance open for this procedure. - If request is for dental extraction, please clarify the # of teeth to be extracted. - If the patient is currently at the dentist's office, call Pre-Op Callback Staff (MA/nurse) to input urgent request.  - If the patient is not currently in the dentist office, please route to the Pre-Op pool.  Request for surgical clearance:  What type of surgery is being performed? LUMPECTOMY  When is this surgery scheduled? TBD  What type of clearance is required (medical clearance vs. Pharmacy clearance to hold med vs. Both)? BOTH  Are there any medications that need to be held prior to surgery and how long?  [facility]  Practice name and name of physician performing surgery? [facility]; DR.  Autumn Messing  What is the office phone number? [phone]   7.   What is the office fax number? [place]: [place], CMA  8.   Anesthesia type (None, local, MAC, general) ? GENERAL   Julaine Hua 11/15/2020, 2:54 PM  _________________________________________________________________   (provider comments below)

## 2020-11-16 NOTE — Telephone Encounter (Signed)
Patient with diagnosis of CAD on low dose Xarelto 2.5mg  BID for anticoagulation, s/p MI in 2017.   Procedure: lumpectomy Date of procedure: TBD  CrCl >132mL/min Platelet count 195K  Per office protocol, patient can hold Xarelto for 2 days prior to procedure.

## 2020-11-19 ENCOUNTER — Other Ambulatory Visit (HOSPITAL_COMMUNITY): Payer: Self-pay

## 2020-11-19 NOTE — Telephone Encounter (Signed)
Given that Xarelto use is for hx of CAD rather than AF, will route to MD for input on clearing to hold then patient will need call. (Do not see a prior clearance on file that we can reference.) Dr. 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   We will plan to call patient to ensure stable from symptom standpoint, but may she hold Xarelto for lumpectomy? Please route response to P CV DIV PREOP (the pre-op pool). Thank you.

## 2020-11-19 NOTE — Telephone Encounter (Addendum)
   Name: Jennifer Cervantes  DOB: 1980/04/22  MRN: [id number]   Primary Cardiologist: Jenkins Rouge, MD  Chart reviewed as part of pre-operative protocol coverage. Patient was contacted 11/19/2020 in reference to pre-operative risk assessment for pending surgery as outlined below.  NIKOLINA SIMERSON was last seen 03/2020 by Dr. Johnsie Cancel. RCRI is calculated at 6.6% indicating moderate CV risk based on prior history of CAD, CHF. I reached out to patient for update on how she is doing. The patient affirms she has been doing well without any new cardiac symptoms. She does not formally exercise but is able to exert herself in everyday life without any cardiac complaints. Therefore, based on ACC/AHA guidelines, the patient would be at acceptable risk for the planned procedure without further cardiovascular testing. The patient was advised that if she develops new symptoms prior to surgery to contact our office to arrange for a follow-up visit, and she verbalized understanding.  Per recommendations outlined below, the patient may hold her Xarelto 2-3 days prior to procedure.   I will route this recommendation to the requesting party via Epic fax function and remove from pre-op pool. Please call with questions.  Charlie Pitter, PA-C 11/19/2020, 4:10 PM

## 2020-11-21 ENCOUNTER — Other Ambulatory Visit: Payer: Self-pay | Admitting: General Surgery

## 2020-11-21 DIAGNOSIS — D241 Benign neoplasm of right breast: Secondary | ICD-10-CM

## 2021-01-02 ENCOUNTER — Encounter (HOSPITAL_BASED_OUTPATIENT_CLINIC_OR_DEPARTMENT_OTHER): Payer: Self-pay | Admitting: General Surgery

## 2021-01-02 ENCOUNTER — Other Ambulatory Visit: Payer: Self-pay

## 2021-01-04 ENCOUNTER — Encounter (HOSPITAL_BASED_OUTPATIENT_CLINIC_OR_DEPARTMENT_OTHER)
Admission: RE | Admit: 2021-01-04 | Discharge: 2021-01-04 | Disposition: A | Discharge status: Home / Self Care | Payer: 59 | Source: Ambulatory Visit | Attending: General Surgery | Admitting: General Surgery

## 2021-01-04 DIAGNOSIS — Z01812 Encounter for preprocedural laboratory examination: Secondary | ICD-10-CM | POA: Insufficient documentation

## 2021-01-04 LAB — BASIC METABOLIC PANEL
Anion gap: 9 (ref 5–15)
BUN: 7 mg/dL (ref 6–20)
CO2: 22 mmol/L (ref 22–32)
Calcium: 9.1 mg/dL (ref 8.9–10.3)
Chloride: 108 mmol/L (ref 98–111)
Creatinine, Ser: 0.89 mg/dL (ref 0.44–1.00)
GFR, Estimated: >60 mL/min (ref >60)
Glucose, Bld: 155 mg/dL — ABNORMAL HIGH (ref 70–99)
Potassium: 4.2 mmol/L (ref 3.5–5.1)
Sodium: 139 mmol/L (ref 135–145)

## 2021-01-04 NOTE — Progress Notes (Signed)

## 2021-01-08 ENCOUNTER — Other Ambulatory Visit: Payer: Self-pay

## 2021-01-08 ENCOUNTER — Ambulatory Visit
Admission: RE | Admit: 2021-01-08 | Discharge: 2021-01-08 | Disposition: A | Discharge status: Home / Self Care | Payer: 59 | Source: Ambulatory Visit | Attending: General Surgery | Admitting: General Surgery

## 2021-01-08 DIAGNOSIS — R928 Other abnormal and inconclusive findings on diagnostic imaging of breast: Secondary | ICD-10-CM | POA: Diagnosis not present

## 2021-01-08 DIAGNOSIS — D241 Benign neoplasm of right breast: Secondary | ICD-10-CM

## 2021-01-09 ENCOUNTER — Ambulatory Visit
Admission: RE | Admit: 2021-01-09 | Discharge: 2021-01-09 | Disposition: A | Discharge status: Home / Self Care | Payer: 59 | Source: Ambulatory Visit | Attending: General Surgery | Admitting: General Surgery

## 2021-01-09 ENCOUNTER — Ambulatory Visit (HOSPITAL_BASED_OUTPATIENT_CLINIC_OR_DEPARTMENT_OTHER): Payer: 59 | Admitting: Anesthesiology

## 2021-01-09 ENCOUNTER — Encounter (HOSPITAL_BASED_OUTPATIENT_CLINIC_OR_DEPARTMENT_OTHER): Payer: Self-pay | Admitting: General Surgery

## 2021-01-09 ENCOUNTER — Ambulatory Visit (HOSPITAL_BASED_OUTPATIENT_CLINIC_OR_DEPARTMENT_OTHER)
Admission: RE | Admit: 2021-01-09 | Discharge: 2021-01-09 | Disposition: A | Discharge status: Home / Self Care | Payer: 59 | Attending: General Surgery | Admitting: General Surgery

## 2021-01-09 ENCOUNTER — Other Ambulatory Visit: Payer: Self-pay

## 2021-01-09 ENCOUNTER — Encounter (HOSPITAL_BASED_OUTPATIENT_CLINIC_OR_DEPARTMENT_OTHER)
Admission: RE | Disposition: A | Discharge status: Home / Self Care | Payer: Self-pay | Source: Home / Self Care | Attending: General Surgery

## 2021-01-09 DIAGNOSIS — N6011 Diffuse cystic mastopathy of right breast: Secondary | ICD-10-CM | POA: Insufficient documentation

## 2021-01-09 DIAGNOSIS — R928 Other abnormal and inconclusive findings on diagnostic imaging of breast: Secondary | ICD-10-CM | POA: Diagnosis not present

## 2021-01-09 DIAGNOSIS — Z955 Presence of coronary angioplasty implant and graft: Secondary | ICD-10-CM | POA: Diagnosis not present

## 2021-01-09 DIAGNOSIS — D241 Benign neoplasm of right breast: Secondary | ICD-10-CM | POA: Diagnosis not present

## 2021-01-09 DIAGNOSIS — N62 Hypertrophy of breast: Secondary | ICD-10-CM | POA: Diagnosis not present

## 2021-01-09 DIAGNOSIS — I110 Hypertensive heart disease with heart failure: Secondary | ICD-10-CM | POA: Diagnosis not present

## 2021-01-09 DIAGNOSIS — I252 Old myocardial infarction: Secondary | ICD-10-CM | POA: Insufficient documentation

## 2021-01-09 DIAGNOSIS — E669 Obesity, unspecified: Secondary | ICD-10-CM

## 2021-01-09 DIAGNOSIS — Z87891 Personal history of nicotine dependence: Secondary | ICD-10-CM | POA: Insufficient documentation

## 2021-01-09 DIAGNOSIS — I25119 Atherosclerotic heart disease of native coronary artery with unspecified angina pectoris: Secondary | ICD-10-CM | POA: Diagnosis not present

## 2021-01-09 DIAGNOSIS — Z7901 Long term (current) use of anticoagulants: Secondary | ICD-10-CM | POA: Diagnosis not present

## 2021-01-09 DIAGNOSIS — R921 Mammographic calcification found on diagnostic imaging of breast: Secondary | ICD-10-CM | POA: Diagnosis not present

## 2021-01-09 DIAGNOSIS — I5032 Chronic diastolic (congestive) heart failure: Secondary | ICD-10-CM | POA: Diagnosis not present

## 2021-01-09 DIAGNOSIS — E1169 Type 2 diabetes mellitus with other specified complication: Secondary | ICD-10-CM

## 2021-01-09 HISTORY — DX: Anemia, unspecified: D64.9

## 2021-01-09 HISTORY — DX: Other complications of anesthesia, initial encounter: T88.59XA

## 2021-01-09 HISTORY — PX: BREAST LUMPECTOMY WITH RADIOACTIVE SEED LOCALIZATION: SHX6424

## 2021-01-09 LAB — POCT PREGNANCY, URINE: Preg Test, Ur: NEGATIVE

## 2021-01-09 LAB — GLUCOSE, CAPILLARY
Glucose-Capillary: 163 mg/dL — ABNORMAL HIGH (ref 70–99)
Glucose-Capillary: 188 mg/dL — ABNORMAL HIGH (ref 70–99)

## 2021-01-09 SURGERY — BREAST LUMPECTOMY WITH RADIOACTIVE SEED LOCALIZATION
Anesthesia: General | Site: Breast | Laterality: Right

## 2021-01-09 MED ORDER — ONDANSETRON HCL 4 MG/2ML IJ SOLN
INTRAMUSCULAR | Status: DC | PRN
  Administered 2021-01-09: 4 mg via INTRAVENOUS

## 2021-01-09 MED ORDER — OXYCODONE HCL 5 MG PO TABS: 5.0000 mg | ORAL_TABLET | Freq: Once | ORAL | Status: DC | PRN

## 2021-01-09 MED ORDER — BUPIVACAINE-EPINEPHRINE (PF) 0.25% -1:200000 IJ SOLN
INTRAMUSCULAR | Status: DC | PRN
  Administered 2021-01-09: 20 mL

## 2021-01-09 MED ORDER — CELECOXIB 200 MG PO CAPS
200.0000 mg | ORAL_CAPSULE | ORAL | Status: AC
  Administered 2021-01-09: 200 mg via ORAL

## 2021-01-09 MED ORDER — FENTANYL CITRATE (PF) 100 MCG/2ML IJ SOLN
INTRAMUSCULAR | Status: AC
  Filled 2021-01-09: qty 2

## 2021-01-09 MED ORDER — MIDAZOLAM HCL 5 MG/5ML IJ SOLN
INTRAMUSCULAR | Status: DC | PRN
  Administered 2021-01-09 (×2): 1 mg via INTRAVENOUS

## 2021-01-09 MED ORDER — PROPOFOL 10 MG/ML IV BOLUS
INTRAVENOUS | Status: DC | PRN
  Administered 2021-01-09: 200 mg via INTRAVENOUS

## 2021-01-09 MED ORDER — CEFAZOLIN SODIUM-DEXTROSE 2-4 GM/100ML-% IV SOLN
INTRAVENOUS | Status: AC
  Filled 2021-01-09: qty 100

## 2021-01-09 MED ORDER — PROPOFOL 10 MG/ML IV BOLUS
INTRAVENOUS | Status: AC
  Filled 2021-01-09: qty 40

## 2021-01-09 MED ORDER — CHLORHEXIDINE GLUCONATE CLOTH 2 % EX PADS: 6.0000 | MEDICATED_PAD | Freq: Once | CUTANEOUS | Status: DC

## 2021-01-09 MED ORDER — EPHEDRINE SULFATE-NACL 50-0.9 MG/10ML-% IV SOSY
PREFILLED_SYRINGE | INTRAVENOUS | Status: DC | PRN
  Administered 2021-01-09 (×2): 5 mg via INTRAVENOUS

## 2021-01-09 MED ORDER — GABAPENTIN 300 MG PO CAPS
300.0000 mg | ORAL_CAPSULE | ORAL | Status: AC
  Administered 2021-01-09: 300 mg via ORAL

## 2021-01-09 MED ORDER — OXYCODONE HCL 5 MG/5ML PO SOLN: 5.0000 mg | Freq: Once | ORAL | Status: DC | PRN

## 2021-01-09 MED ORDER — MIDAZOLAM HCL 2 MG/2ML IJ SOLN
INTRAMUSCULAR | Status: AC
  Filled 2021-01-09: qty 2

## 2021-01-09 MED ORDER — ACETAMINOPHEN 500 MG PO TABS
1000.0000 mg | ORAL_TABLET | ORAL | Status: AC
  Administered 2021-01-09: 1000 mg via ORAL

## 2021-01-09 MED ORDER — BUPIVACAINE-EPINEPHRINE (PF) 0.25% -1:200000 IJ SOLN
INTRAMUSCULAR | Status: AC
  Filled 2021-01-09: qty 90

## 2021-01-09 MED ORDER — DEXAMETHASONE SODIUM PHOSPHATE 10 MG/ML IJ SOLN
INTRAMUSCULAR | Status: DC | PRN
Start: 2021-01-09 — End: 2021-01-09
  Administered 2021-01-09: 5 mg via INTRAVENOUS

## 2021-01-09 MED ORDER — DEXAMETHASONE SODIUM PHOSPHATE 10 MG/ML IJ SOLN
INTRAMUSCULAR | Status: AC
  Filled 2021-01-09: qty 2

## 2021-01-09 MED ORDER — FENTANYL CITRATE (PF) 100 MCG/2ML IJ SOLN
INTRAMUSCULAR | Status: DC | PRN
  Administered 2021-01-09 (×3): 25 ug via INTRAVENOUS

## 2021-01-09 MED ORDER — LACTATED RINGERS IV SOLN: INTRAVENOUS | Status: DC | PRN

## 2021-01-09 MED ORDER — GABAPENTIN 300 MG PO CAPS
ORAL_CAPSULE | ORAL | Status: AC
  Filled 2021-01-09: qty 1

## 2021-01-09 MED ORDER — BUPIVACAINE HCL (PF) 0.25 % IJ SOLN
INTRAMUSCULAR | Status: AC
  Filled 2021-01-09: qty 90

## 2021-01-09 MED ORDER — ACETAMINOPHEN 500 MG PO TABS
ORAL_TABLET | ORAL | Status: AC
  Filled 2021-01-09: qty 2

## 2021-01-09 MED ORDER — ONDANSETRON HCL 4 MG/2ML IJ SOLN: 4.0000 mg | Freq: Once | INTRAMUSCULAR | Status: DC | PRN

## 2021-01-09 MED ORDER — HYDROCODONE-ACETAMINOPHEN 5-325 MG PO TABS: 1.0000 | ORAL_TABLET | Freq: Four times a day (QID) | ORAL | 0 refills | Status: DC | PRN

## 2021-01-09 MED ORDER — LIDOCAINE 2% (20 MG/ML) 5 ML SYRINGE
INTRAMUSCULAR | Status: DC | PRN
Start: 2021-01-09 — End: 2021-01-09
  Administered 2021-01-09: 100 mg via INTRAVENOUS

## 2021-01-09 MED ORDER — PHENYLEPHRINE 40 MCG/ML (10ML) SYRINGE FOR IV PUSH (FOR BLOOD PRESSURE SUPPORT)
PREFILLED_SYRINGE | INTRAVENOUS | Status: DC | PRN
  Administered 2021-01-09 (×3): 40 ug via INTRAVENOUS

## 2021-01-09 MED ORDER — FENTANYL CITRATE (PF) 100 MCG/2ML IJ SOLN: 25.0000 ug | INTRAMUSCULAR | Status: DC | PRN

## 2021-01-09 MED ORDER — LACTATED RINGERS IV SOLN: INTRAVENOUS | Status: DC

## 2021-01-09 MED ORDER — LIDOCAINE 2% (20 MG/ML) 5 ML SYRINGE
INTRAMUSCULAR | Status: AC
  Filled 2021-01-09: qty 5

## 2021-01-09 MED ORDER — ONDANSETRON HCL 4 MG/2ML IJ SOLN
INTRAMUSCULAR | Status: AC
  Filled 2021-01-09: qty 4

## 2021-01-09 MED ORDER — CELECOXIB 200 MG PO CAPS
ORAL_CAPSULE | ORAL | Status: AC
  Filled 2021-01-09: qty 1

## 2021-01-09 MED ORDER — CEFAZOLIN SODIUM-DEXTROSE 2-4 GM/100ML-% IV SOLN
2.0000 g | INTRAVENOUS | Status: AC
  Administered 2021-01-09: 2 g via INTRAVENOUS

## 2021-01-09 MED ORDER — AMISULPRIDE (ANTIEMETIC) 5 MG/2ML IV SOLN: 10.0000 mg | Freq: Once | INTRAVENOUS | Status: DC | PRN

## 2021-01-09 SURGICAL SUPPLY — 32 items
ADH SKN CLS APL DERMABOND .7 (GAUZE/BANDAGES/DRESSINGS) ×1
APL PRP STRL LF DISP 70% ISPRP (MISCELLANEOUS) ×1
BLADE SURG 15 STRL LF DISP TIS (BLADE) ×1 IMPLANT
BLADE SURG 15 STRL SS (BLADE) ×3
CANISTER SUC SOCK COL 7IN (MISCELLANEOUS) ×3 IMPLANT
CANISTER SUCT 1200ML W/VALVE (MISCELLANEOUS) ×3 IMPLANT
CHLORAPREP W/TINT 26 (MISCELLANEOUS) ×3 IMPLANT
COVER BACK TABLE 60X90IN (DRAPES) ×3 IMPLANT
COVER MAYO STAND STRL (DRAPES) ×3 IMPLANT
COVER PROBE W GEL 5X96 (DRAPES) ×3 IMPLANT
DERMABOND ADVANCED (GAUZE/BANDAGES/DRESSINGS) ×2
DERMABOND ADVANCED .7 DNX12 (GAUZE/BANDAGES/DRESSINGS) ×1 IMPLANT
DRAPE LAPAROSCOPIC ABDOMINAL (DRAPES) ×3 IMPLANT
DRAPE UTILITY XL STRL (DRAPES) ×3 IMPLANT
ELECT COATED BLADE 2.86 ST (ELECTRODE) ×3 IMPLANT
ELECT REM PT RETURN 9FT ADLT (ELECTROSURGICAL) ×3
ELECTRODE REM PT RTRN 9FT ADLT (ELECTROSURGICAL) ×1 IMPLANT
GLOVE SURG ENC MOIS LTX SZ7.5 (GLOVE) ×3 IMPLANT
GLOVE SURG UNDER POLY LF SZ7 (GLOVE) ×3 IMPLANT
GOWN STRL REUS W/ TWL LRG LVL3 (GOWN DISPOSABLE) ×2 IMPLANT
GOWN STRL REUS W/TWL LRG LVL3 (GOWN DISPOSABLE) ×6
KIT MARKER MARGIN INK (KITS) ×3 IMPLANT
NEEDLE HYPO 25X1 1.5 SAFETY (NEEDLE) ×3 IMPLANT
PACK BASIN DAY SURGERY FS (CUSTOM PROCEDURE TRAY) ×3 IMPLANT
PENCIL SMOKE EVACUATOR (MISCELLANEOUS) ×3 IMPLANT
SLEEVE SCD COMPRESS KNEE MED (STOCKING) ×3 IMPLANT
SPONGE T-LAP 18X18 ~~LOC~~+RFID (SPONGE) ×3 IMPLANT
SUT MON AB 4-0 PC3 18 (SUTURE) ×3 IMPLANT
SUT VICRYL 3-0 CR8 SH (SUTURE) ×3 IMPLANT
SYR CONTROL 10ML LL (SYRINGE) ×3 IMPLANT
TOWEL GREEN STERILE FF (TOWEL DISPOSABLE) ×3 IMPLANT
TRAY FAXITRON CT DISP (TRAY / TRAY PROCEDURE) ×3 IMPLANT

## 2021-01-09 NOTE — Transfer of Care (Signed)
Immediate Anesthesia Transfer of Care Note  Patient: Jennifer Cervantes  Procedure(s) Performed: RIGHT BREAST LUMPECTOMY WITH RADIOACTIVE SEED LOCALIZATION (Right: Breast)  Patient Location: PACU  Anesthesia Type:General  Level of Consciousness: drowsy and patient cooperative  Airway & Oxygen Therapy: Patient Spontanous Breathing and Patient connected to face mask oxygen  Post-op Assessment: Report given to RN and Post -op Vital signs reviewed and stable  Post vital signs: Reviewed and stable  Last Vitals:  Vitals Value Taken Time  BP 117/69 01/09/21 0905  Temp    Pulse 68 01/09/21 0906  Resp 18 01/09/21 0906  SpO2 100 % 01/09/21 0906  Vitals shown include unvalidated device data.  Last Pain:  Vitals:   01/09/21 0715  TempSrc: Oral  PainSc: 3       Patients Stated Pain Goal: 4 (38/18/40 3754)  Complications: No notable events documented.

## 2021-01-09 NOTE — Anesthesia Preprocedure Evaluation (Signed)
Anesthesia Evaluation  Patient identified by MRN, date of birth, ID band Patient awake    Reviewed: Allergy & Precautions, NPO status , Patient's Chart, lab work & pertinent test results, reviewed documented beta blocker date and time   History of Anesthesia Complications Negative for: history of anesthetic complications  Airway Mallampati: II  TM Distance: >3 FB Neck ROM: Full    Dental  (+) Dental Advisory Given, Teeth Intact   Pulmonary neg pulmonary ROS, former smoker,    Pulmonary exam normal        Cardiovascular hypertension, Pt. on medications and Pt. on home beta blockers + CAD, + Past MI, + Cardiac Stents (2017, 2018) and +CHF  Normal cardiovascular exam   Echo 2019: EF 60-65%, no RWMA, PASP 31, valves unremarkable   Neuro/Psych negative neurological ROS     GI/Hepatic Neg liver ROS, GERD  ,  Endo/Other  diabetes, Type 2, Oral Hypoglycemic AgentsHypothyroidism   Renal/GU negative Renal ROS  negative genitourinary   Musculoskeletal negative musculoskeletal ROS (+)   Abdominal   Peds  Hematology Xarelto   Anesthesia Other Findings   Reproductive/Obstetrics                             Anesthesia Physical Anesthesia Plan  ASA: 3  Anesthesia Plan: General   Post-op Pain Management:    Induction: Intravenous  PONV Risk Score and Plan: 3 and Ondansetron, Dexamethasone, Midazolam and Treatment may vary due to age or medical condition  Airway Management Planned: LMA  Additional Equipment: None  Intra-op Plan:   Post-operative Plan: Extubation in OR  Informed Consent: I have reviewed the patients History and Physical, chart, labs and discussed the procedure including the risks, benefits and alternatives for the proposed anesthesia with the patient or authorized representative who has indicated his/her understanding and acceptance.     Dental advisory given  Plan  Discussed with:   Anesthesia Plan Comments:         Anesthesia Quick Evaluation

## 2021-01-09 NOTE — Discharge Instructions (Signed)

## 2021-01-09 NOTE — Interval H&P Note (Signed)
History and Physical Interval Note:  01/09/2021 7:51 AM  Jennifer Cervantes  has presented today for surgery, with the diagnosis of RIGHT BREAST INTRADUCTAL PAPILLOMA.  The various methods of treatment have been discussed with the patient and family. After consideration of risks, benefits and other options for treatment, the patient has consented to  Procedure(s): RIGHT BREAST LUMPECTOMY WITH RADIOACTIVE SEED LOCALIZATION (Right) as a surgical intervention.  The patient's history has been reviewed, patient examined, no change in status, stable for surgery.  I have reviewed the patient's chart and labs.  Questions were answered to the patient's satisfaction.     [name]

## 2021-01-09 NOTE — Anesthesia Procedure Notes (Signed)
Procedure Name: LMA Insertion Date/Time: 01/09/2021 8:22 AM Performed by: Janene Harvey, CRNA Pre-anesthesia Checklist: Patient identified, Emergency Drugs available, Suction available and Patient being monitored Patient Re-evaluated:Patient Re-evaluated prior to induction Oxygen Delivery Method: Circle system utilized Preoxygenation: Pre-oxygenation with 100% oxygen Induction Type: IV induction LMA: LMA inserted LMA Size: 4.0 Placement Confirmation: positive ETCO2 Dental Injury: Teeth and Oropharynx as per pre-operative assessment

## 2021-01-09 NOTE — Op Note (Signed)
01/09/2021  8:59 AM  PATIENT:  Jennifer Cervantes  40 y.o. female  PRE-OPERATIVE DIAGNOSIS:  RIGHT BREAST INTRADUCTAL PAPILLOMA  POST-OPERATIVE DIAGNOSIS:  RIGHT BREAST INTRADUCTAL PAPILLOMA  PROCEDURE:  Procedure(s): RIGHT BREAST LUMPECTOMY WITH RADIOACTIVE SEED LOCALIZATION (Right)  SURGEON:  Surgeon(s) and Role:    * Jovita Kussmaul, MD - Primary  PHYSICIAN ASSISTANT:   ASSISTANTS: none   ANESTHESIA:   local and general  EBL:  20 mL   BLOOD ADMINISTERED:none  DRAINS: none   LOCAL MEDICATIONS USED:  MARCAINE     SPECIMEN:  Source of Specimen:  right breast tissue  DISPOSITION OF SPECIMEN:  PATHOLOGY  COUNTS:  YES  TOURNIQUET:  * No tourniquets in log *  DICTATION: .Dragon Dictation  After informed consent was obtained the patient was brought to the operating room and placed in the supine position on the operating table.  After adequate induction of general anesthesia the patient's right breast was prepped with ChloraPrep, allowed to dry, and draped in usual sterile manner.  An appropriate timeout was performed.  Previously an I-125 seed was placed in the lower outer central right breast to mark an area of intraductal papilloma.  The neoprobe was set to I-125 in the area of radioactivity was readily identified.  The area around this was infiltrated with quarter percent Marcaine.  A curvilinear incision was made along the lower outer edge of the right areola with a 15 blade knife.  The incision was carried through the skin and subcutaneous tissue sharply with the electrocautery.  Dissection was then carried towards the radioactive seed under the direction of the neoprobe.  Once I more closely approached the radioactive seed I then removed a circular portion of breast tissue sharply with the electrocautery around the radioactive seed while checking the area of radioactivity frequently.  Once the tissue specimen was removed it was oriented with the appropriate paint colors.  A  specimen radiograph was obtained that showed the clip and seed to be near the center of the specimen.  The specimen was then sent to pathology for further evaluation.  Hemostasis was achieved using the Bovie electrocautery.  The wound was irrigated with saline and infiltrated with more quarter percent Marcaine.  The deep layer of the wound was then closed with layers of interrupted 3-0 Vicryl stitches.  The skin was then closed with interrupted 4-0 Monocryl subcuticular stitches.  Dermabond dressings were applied.  The patient tolerated the procedure well.  At the end of the case all needle sponge and instrument counts were correct.  The patient was then awakened and taken to recovery in stable condition.  PLAN OF CARE: Discharge to home after PACU  PATIENT DISPOSITION:  PACU - hemodynamically stable.   Delay start of Pharmacological VTE agent (>24hrs) due to surgical blood loss or risk of bleeding: not applicable

## 2021-01-09 NOTE — H&P (Signed)
REFERRING PHYSICIAN: Tysinger, Redge Gainer, *  PROVIDER: Landry Corporal, MD  MRN: [id number] DOB: 1980-09-10 Subjective  Chief Complaint: No chief complaint on file.   History of Present Illness: Jennifer Cervantes is a 40 y.o. female who is seen today as an office consultation at the request of Dr. Glade Lloyd for evaluation of No chief complaint on file. .   We are asked to see the patient in consultation by Dr. Glade Lloyd to evaluate her for an intraductal papilloma of the right breast. The patient is a 40 year old black female who recently went for routine screening mammogram. At that time she was found to have a 1.2 cm area of distortion in the lower outer quadrant of the right breast. This was biopsied and came back as an intraductal papilloma. She also was noted to have some cysts in the outer aspect of the right breast as well. She denies any family history. She does have a history of myocardial infarction with stents in her heart and she is on Xarelto  Review of Systems: A complete review of systems was obtained from the patient. I have reviewed this information and discussed as appropriate with the patient. See HPI as well for other ROS.  ROS   Medical History: Past Medical History:  Diagnosis Date   DVT (deep venous thrombosis) (CMS-HCC)   Hyperlipidemia   Hypertension   Patient Active Problem List  Diagnosis   Intraductal papilloma of right breast   History reviewed. No pertinent surgical history.   No Known Allergies  Current Outpatient Medications on File Prior to Visit  Medication Sig Dispense Refill   cholecalciferol (VITAMIN D3) 1000 unit tablet Take by mouth   nitroGLYcerin (NITROSTAT) 0.4 MG SL tablet Place 0.4 mg under the tongue every 5 (five) minutes as needed   acetaminophen (TYLENOL) 500 MG tablet Take by mouth   DEXCOM G6 TRANSMITTER Devi   losartan (COZAAR) 50 MG tablet   metoprolol tartrate (LOPRESSOR) 50 MG tablet metoprolol tartrate 50 mg tablet    rosuvastatin (CRESTOR) 40 MG tablet   SYNJARDY 5-1,000 mg tablet   XARELTO 2.5 mg tablet   No current facility-administered medications on file prior to visit.   Family History  Problem Relation Age of Onset   High blood pressure (Hypertension) Mother   Hyperlipidemia (Elevated cholesterol) Mother   Diabetes Mother   Obesity Sister    Social History   Tobacco Use  Smoking Status Former Smoker  Smokeless Tobacco Former Systems developer    Social History   Socioeconomic History   Marital status: Single  Tobacco Use   Smoking status: Former Smoker   Smokeless tobacco: Former Counsellor Use: Never used  Substance and Sexual Activity   Alcohol use: Not Currently   Drug use: Never   Objective:   Vitals:  BP: 124/68  Pulse: 98  Temp: 36.7 C (98 F)  SpO2: 100%  Weight: 100.6 kg (221 lb 12.8 oz)  Height: 175.3 cm (5\' 9" )   Body mass index is 32.75 kg/m.  Physical Exam Vitals reviewed.  Constitutional:  General: She is not in acute distress. Appearance: Normal appearance.  HENT:  Head: Normocephalic and atraumatic.  Right Ear: External ear normal.  Left Ear: External ear normal.  Nose: Nose normal.  Mouth/Throat:  Mouth: Mucous membranes are moist.  Pharynx: Oropharynx is clear.  Eyes:  General: No scleral icterus. Extraocular Movements: Extraocular movements intact.  Conjunctiva/sclera: Conjunctivae normal.  Pupils: Pupils are equal, round, and reactive to  light.  Cardiovascular:  Rate and Rhythm: Normal rate and regular rhythm.  Pulses: Normal pulses.  Heart sounds: Normal heart sounds.  Pulmonary:  Effort: Pulmonary effort is normal. No respiratory distress.  Breath sounds: Normal breath sounds.  Abdominal:  General: Bowel sounds are normal.  Palpations: Abdomen is soft.  Tenderness: There is no abdominal tenderness.  Musculoskeletal:  General: No swelling, tenderness or deformity. Normal range of motion.  Cervical back: Normal range of  motion and neck supple.  Skin: General: Skin is warm and dry.  Coloration: Skin is not jaundiced.  Neurological:  General: No focal deficit present.  Mental Status: She is alert and oriented to person, place, and time.  Psychiatric:  Mood and Affect: Mood normal.  Behavior: Behavior normal.   Breast: There is a very small palpable bruise in the lower outer quadrant of the right breast. Other than this there is no palpable mass in either breast. There is no palpable axillary, supraclavicular, or cervical lymphadenopathy.  Labs, Imaging and Diagnostic Testing:  Assessment and Plan:  Diagnoses and all orders for this visit:  Intraductal papilloma of right breast - [facility] Case Posting Request; Future    The patient appears to have a 1.2 cm intraductal papilloma in the lower outer quadrant of the right breast. Because of its size there is about a 5 to 10% chance of missing something more significant. For this reason the recommendation is to have this area removed. She would also like to have this done. I have discussed with her in detail the risks and benefits of the operation as well as some of the technical aspects including use of a radioactive seed for localization and she understands and wishes to proceed. We will obtain cardiac clearance prior to surgery since she will need to be off the Xarelto.

## 2021-01-09 NOTE — Anesthesia Postprocedure Evaluation (Signed)
Anesthesia Post Note  Patient: Jennifer Cervantes  Procedure(s) Performed: RIGHT BREAST LUMPECTOMY WITH RADIOACTIVE SEED LOCALIZATION (Right: Breast)     Patient location during evaluation: PACU Anesthesia Type: General Level of consciousness: awake and alert Pain management: pain level controlled Vital Signs Assessment: post-procedure vital signs reviewed and stable Respiratory status: spontaneous breathing, nonlabored ventilation and respiratory function stable Cardiovascular status: blood pressure returned to baseline and stable Postop Assessment: no apparent nausea or vomiting Anesthetic complications: no   No notable events documented.  Last Vitals:  Vitals:   01/09/21 1025 01/09/21 1030  BP: (!) 128/92 118/80  Pulse: 75 69  Resp: 20 18  Temp: 36.6 C 36.6 C  SpO2: 98% 98%    Last Pain:  Vitals:   01/09/21 1030  TempSrc:   PainSc: 0-No pain                 Lidia Collum

## 2021-01-10 ENCOUNTER — Encounter (HOSPITAL_BASED_OUTPATIENT_CLINIC_OR_DEPARTMENT_OTHER): Payer: Self-pay | Admitting: General Surgery

## 2021-01-10 LAB — SURGICAL PATHOLOGY

## 2021-02-07 ENCOUNTER — Other Ambulatory Visit (HOSPITAL_COMMUNITY): Payer: Self-pay

## 2021-03-26 DIAGNOSIS — Z01419 Encounter for gynecological examination (general) (routine) without abnormal findings: Secondary | ICD-10-CM | POA: Diagnosis not present

## 2021-03-26 DIAGNOSIS — L02422 Furuncle of left axilla: Secondary | ICD-10-CM | POA: Diagnosis not present

## 2021-03-26 DIAGNOSIS — Z1151 Encounter for screening for human papillomavirus (HPV): Secondary | ICD-10-CM | POA: Diagnosis not present

## 2021-03-26 DIAGNOSIS — Z6833 Body mass index (BMI) 33.0-33.9, adult: Secondary | ICD-10-CM | POA: Diagnosis not present

## 2021-03-26 DIAGNOSIS — Z113 Encounter for screening for infections with a predominantly sexual mode of transmission: Secondary | ICD-10-CM | POA: Diagnosis not present

## 2021-03-26 DIAGNOSIS — N926 Irregular menstruation, unspecified: Secondary | ICD-10-CM | POA: Diagnosis not present

## 2021-03-29 ENCOUNTER — Other Ambulatory Visit: Payer: Self-pay | Admitting: Medical

## 2021-03-29 ENCOUNTER — Other Ambulatory Visit (HOSPITAL_COMMUNITY): Payer: Self-pay

## 2021-04-01 ENCOUNTER — Other Ambulatory Visit (HOSPITAL_COMMUNITY): Payer: Self-pay

## 2021-04-01 MED ORDER — ASPIRIN 81 MG PO TBEC
81.0000 mg | DELAYED_RELEASE_TABLET | Freq: Every day | ORAL | 0 refills | Status: DC
Start: 2021-04-01 — End: 2021-06-24
  Filled 2021-04-01: qty 90, 90d supply, fill #0

## 2021-04-10 DIAGNOSIS — L02421 Furuncle of right axilla: Secondary | ICD-10-CM | POA: Diagnosis not present

## 2021-04-23 ENCOUNTER — Encounter (HOSPITAL_COMMUNITY): Payer: Self-pay

## 2021-05-17 ENCOUNTER — Other Ambulatory Visit (HOSPITAL_COMMUNITY): Payer: Self-pay

## 2021-06-24 ENCOUNTER — Other Ambulatory Visit: Payer: Self-pay | Admitting: Cardiovascular Disease

## 2021-06-24 ENCOUNTER — Other Ambulatory Visit: Payer: Self-pay | Admitting: Medical

## 2021-06-25 ENCOUNTER — Other Ambulatory Visit (HOSPITAL_COMMUNITY): Payer: Self-pay

## 2021-06-25 MED ORDER — ASPIRIN 81 MG PO TBEC
81.0000 mg | DELAYED_RELEASE_TABLET | Freq: Every day | ORAL | 0 refills | Status: DC
  Filled 2021-06-25: qty 30, 30d supply, fill #0

## 2021-06-25 MED ORDER — XARELTO 2.5 MG PO TABS
2.5000 mg | ORAL_TABLET | Freq: Two times a day (BID) | ORAL | 0 refills | Status: DC
  Filled 2021-06-25: qty 60, 30d supply, fill #0

## 2021-06-25 NOTE — Telephone Encounter (Signed)
Prescription refill request for Xarelto received.  ?Indication: CAD ?Last office visit: 04/06/20  Edmonia James MD ?Weight: 104.1kg ?Age: 41 ?Scr: 0.89 on 01/04/21 ?CrCl: 135.32 ? ?Based on above findings Xarelto 2.'5mg'$  twice daily is the appropriate dose.   ? ?Pt is past for for appointment with Dr Johnsie Cancel.  Message sent to schedulers to make appt.  Refill approved x 1 only. ? ?

## 2021-07-15 ENCOUNTER — Other Ambulatory Visit: Payer: Self-pay | Admitting: Medical

## 2021-07-15 ENCOUNTER — Other Ambulatory Visit (HOSPITAL_COMMUNITY): Payer: Self-pay

## 2021-07-15 MED ORDER — ASPIRIN 81 MG PO TBEC
81.0000 mg | DELAYED_RELEASE_TABLET | Freq: Every day | ORAL | 0 refills | Status: DC
  Filled 2021-07-15: qty 30, 30d supply, fill #0

## 2021-07-15 MED ORDER — LOSARTAN POTASSIUM 50 MG PO TABS
50.0000 mg | ORAL_TABLET | Freq: Every day | ORAL | 0 refills | Status: DC
  Filled 2021-07-15: qty 30, 30d supply, fill #0

## 2021-08-02 ENCOUNTER — Ambulatory Visit: Payer: 59 | Admitting: Medical

## 2021-08-02 ENCOUNTER — Ambulatory Visit: Payer: 59

## 2021-08-02 ENCOUNTER — Other Ambulatory Visit (HOSPITAL_COMMUNITY): Payer: Self-pay

## 2021-08-02 ENCOUNTER — Ambulatory Visit: Payer: 59 | Admitting: Podiatry

## 2021-08-02 VITALS — BP 120/70 | HR 71 | Wt 218.2 lb

## 2021-08-02 DIAGNOSIS — E559 Vitamin D deficiency, unspecified: Secondary | ICD-10-CM

## 2021-08-02 DIAGNOSIS — B351 Tinea unguium: Secondary | ICD-10-CM

## 2021-08-02 DIAGNOSIS — I10 Essential (primary) hypertension: Secondary | ICD-10-CM | POA: Diagnosis not present

## 2021-08-02 DIAGNOSIS — R232 Flushing: Secondary | ICD-10-CM

## 2021-08-02 DIAGNOSIS — I252 Old myocardial infarction: Secondary | ICD-10-CM

## 2021-08-02 DIAGNOSIS — R0989 Other specified symptoms and signs involving the circulatory and respiratory systems: Secondary | ICD-10-CM | POA: Diagnosis not present

## 2021-08-02 DIAGNOSIS — Z79899 Other long term (current) drug therapy: Secondary | ICD-10-CM

## 2021-08-02 DIAGNOSIS — E118 Type 2 diabetes mellitus with unspecified complications: Secondary | ICD-10-CM | POA: Diagnosis not present

## 2021-08-02 DIAGNOSIS — M722 Plantar fascial fibromatosis: Secondary | ICD-10-CM

## 2021-08-02 DIAGNOSIS — D539 Nutritional anemia, unspecified: Secondary | ICD-10-CM

## 2021-08-02 DIAGNOSIS — E7849 Other hyperlipidemia: Secondary | ICD-10-CM | POA: Diagnosis not present

## 2021-08-02 MED ORDER — SYNJARDY 5-1000 MG PO TABS
1.0000 | ORAL_TABLET | Freq: Two times a day (BID) | ORAL | 3 refills | Status: DC
  Filled 2021-08-02: qty 180, 90d supply, fill #0

## 2021-08-02 MED ORDER — ROSUVASTATIN CALCIUM 40 MG PO TABS
40.0000 mg | ORAL_TABLET | Freq: Every day | ORAL | 3 refills | Status: DC
  Filled 2021-08-02: qty 90, 90d supply, fill #0
  Filled 2021-12-18: qty 90, 90d supply, fill #1
  Filled 2022-03-11: qty 90, 90d supply, fill #2

## 2021-08-02 MED ORDER — XARELTO 2.5 MG PO TABS
2.5000 mg | ORAL_TABLET | Freq: Two times a day (BID) | ORAL | 0 refills | Status: DC
  Filled 2021-08-02: qty 60, 30d supply, fill #0

## 2021-08-02 MED ORDER — LOSARTAN POTASSIUM 50 MG PO TABS
50.0000 mg | ORAL_TABLET | Freq: Every day | ORAL | 0 refills | Status: DC
  Filled 2021-08-02: qty 90, 90d supply, fill #0

## 2021-08-02 MED ORDER — DEXCOM G6 TRANSMITTER MISC
1.0000 | 11 refills | Status: DC
  Filled 2021-08-02 – 2021-08-09 (×2): qty 1, 90d supply, fill #0
  Filled 2021-11-21: qty 1, 90d supply, fill #1
  Filled 2022-03-11: qty 1, 90d supply, fill #2
  Filled 2022-07-02 – 2022-07-04 (×2): qty 1, 90d supply, fill #3

## 2021-08-02 MED ORDER — METOPROLOL TARTRATE 25 MG PO TABS
25.0000 mg | ORAL_TABLET | Freq: Two times a day (BID) | ORAL | 0 refills | Status: DC
  Filled 2021-08-02: qty 180, 90d supply, fill #0

## 2021-08-02 MED ORDER — DEXCOM G6 SENSOR MISC
1.0000 | 11 refills | Status: DC
Start: 2021-08-02 — End: 2022-10-14
  Filled 2021-08-02 – 2021-08-09 (×3): qty 3, 30d supply, fill #0
  Filled 2021-10-16: qty 3, 30d supply, fill #1
  Filled 2021-11-21: qty 3, 30d supply, fill #2
  Filled 2021-12-18: qty 3, 30d supply, fill #3
  Filled 2022-02-01: qty 3, 30d supply, fill #4
  Filled 2022-03-11: qty 3, 30d supply, fill #5
  Filled 2022-07-02 – 2022-07-04 (×2): qty 3, 30d supply, fill #6
  Filled 2022-07-30: qty 3, 30d supply, fill #7

## 2021-08-02 MED ORDER — ASPIRIN 81 MG PO TBEC
81.0000 mg | DELAYED_RELEASE_TABLET | Freq: Every day | ORAL | 3 refills | Status: DC
  Filled 2021-08-02: qty 90, 90d supply, fill #0
  Filled 2021-12-18: qty 90, 90d supply, fill #1
  Filled 2022-03-06: qty 90, 90d supply, fill #2
  Filled 2022-07-02 – 2022-07-04 (×2): qty 90, 90d supply, fill #3

## 2021-08-02 MED ORDER — NITROGLYCERIN 0.4 MG SL SUBL
0.4000 mg | SUBLINGUAL_TABLET | SUBLINGUAL | 6 refills | Status: AC | PRN
Start: 2021-08-02 — End: ?
  Filled 2021-08-02: qty 25, 5d supply, fill #0

## 2021-08-02 NOTE — Progress Notes (Unsigned)
Subjective:  Jennifer Cervantes is a 41 y.o. female who presents for Chief Complaint  Patient presents with   diabetes follow-up    Diabetes follow-up. Needs refill on meds. Nonfasting. No recent eye exam      Patient Care Team: Jennifer Cervantes, Jennifer Eng, PA-C as PCP - General (Family Medicine) Jennifer Commons, MD (Inactive) as PCP - Cardiology (Cardiology) Sees dentist Sees eye doctor Dr. Nat Cervantes, gynecology [facility] Dr. Marcial Cervantes and Jennifer Retort, NP, neurology Dr. Christinia Cervantes, pulmonology Gastroenterology Dr. Boneta Cervantes, podiatry Dr. Autumn Cervantes, general surgery    Here for med check  Diabetes-compliant with Synjardy 06/998 mg twice daily  Hypertension - she notes she is taking metoprolol 25 mg twice daily, losartan 50 mg daily, but doesn't think she has a diagnosis of hypertension and doesn't want to be on these medications.     Hyperlipidemia-compliant with Crestor 40 mg daily without problems.  Vitamin D deficiency-compliant with vitamin D supplement 1000 daily  Taking Xarelto and aspirin daily.  Last period months ago.  Having hot flashes.  Wants hormone testing.   Thinks she may be entering menopause early.   Not exercising.    Nonsmoker, is a former smoker.  No other aggravating or relieving factors.    No other c/o.  Past Medical History:  Diagnosis Date   Anemia    CAD (coronary artery disease)    a. LHC on 06/21/15 which showed 75% occl mid-dist LCx, 40% occl mRCA, 99% mid-dist LAD s/p DES and 50% occl mLAD s/p DES (overlapping stents) and significant LV dysfunction with anteroapical HK; b. 06/2015 Myoview: EF 55-65%, no ischemia/infarct.   CHF (congestive heart failure) (HCC)    Complication of anesthesia    adverse reaction to anesthesia-wakes up very confused, agitated, getting out of bed with eyes closed   GERD (gastroesophageal reflux disease)    Hyperlipidemia    Hypertension    Hypothyroidism    Myocardial stunning    a. EF  45% on 2D ECHO on 06/21/15 and severe LV dysfunction on LV gram by cath. Repeat 2D ECHO with EF 55-60% and mild HK of the apicoseptal myocardium.   Neuropraxia of right median nerve    a. suspected after right radial cath. will follow with neuro outpatient if does not resolve.    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 05/2015 Following PCI (radial access)-->conservatively managed.   Tobacco abuse    Tricuspid regurgitation    Type II diabetes mellitus ([place])    a. Type II   Vitamin D deficiency    Current Outpatient Medications on File Prior to Visit  Medication Sig Dispense Refill   acetaminophen (TYLENOL) 500 MG tablet Take 500 mg by mouth every 6 (six) hours as needed for headache (pain).     cholecalciferol (VITAMIN D3) 25 MCG (1000 UNIT) tablet Take 2 tablets (2,000 Units total) by mouth daily. 180 tablet 3   HYDROcodone-acetaminophen (NORCO/VICODIN) 5-325 MG tablet Take 1 tablet by mouth every 6 (six) hours as needed for moderate pain or severe pain. 10 tablet 0   No current facility-administered medications on file prior to visit.     The following portions of the patient's history were reviewed and updated as appropriate: allergies, current medications, past family history, past medical history, past social history, past surgical history and problem list.  ROS Otherwise as in subjective above  Objective: BP 120/70   Pulse 71   Wt 218 lb 3.2 oz (99 kg)   BMI 32.22 kg/m   Wt Readings from Last 3 Encounters:  08/02/21 218 lb 3.2 oz (99 kg)  01/09/21 226 lb 3.1 oz (102.6 kg)  10/31/20 226 lb 6.4 oz (102.7 kg)    General appearance: alert, no distress, well developed, well nourished Neck: supple, no lymphadenopathy, no thyromegaly, no masses Heart: RRR, normal S1, S2, no murmurs Lungs: CTA bilaterally, no wheezes, rhonchi, or rales Pulses: 2+ radial pulses, 2+ pedal pulses, normal  cap refill Ext: no edema     Assessment: Encounter Diagnoses  Name Primary?   Type II diabetes mellitus with complication (HCC) Yes   History of non-ST elevation myocardial infarction (NSTEMI)    Essential hypertension    Vitamin D deficiency    Other hyperlipidemia    Decreased pulses in feet    Deficiency anemia    Hot flashes      Plan: Diabetes-continue current medication, she will go for fasting labs next week at a [facility] draw station.  Continue glucose monitoring.  Hyperlipidemia-Go for fasting labs next week as planned, continue statin  Vitamin D deficiency-plan for updated labs, continue supplement  Decreased pulses in feet-we will refer for updated ABI  Hot flashes-we discussed possible causes.  Await lab results.  Coronary artery disease, history of MI-she had questions today about whether she really has high blood pressure or not and about whether she needs to continue on her blood pressure medication.  She feels she does not have high blood pressure.  Looking back in her chart I have records going back to 2017.  At that time she came to me in 2017 already on blood pressure medications and diagnosis was listed in prior records at the time she established here.  We discussed the benefits and risk and uses for metoprolol and losartan.  We discussed microalbuminuria, kidney protection, and reasons for losartan.  I recommend she have a talk with her cardiologist coming up at her next visit in July 2023 to get their opinion going forward.  I reviewed her last cardiology note from February 2022.  She has history of coronary artery disease with multiple stents to LAD most recently 2018, normal stress test with no ischemia on April 2021.  There was a specific note to avoid right arm for future catheterization.  She was continued on medical therapy including Xarelto, aspirin, Crestor, losartan, metoprolol  We discussed prior questions about sleep and snoring. No current concerns  .   We will hold off on any referral for sleep eval at this point.   Jennifer Cervantes was seen today for diabetes follow-up.  Diagnoses and all orders for this visit:  Type II diabetes mellitus with complication ([place]) -     Hemoglobin A1c; Future -     Comprehensive metabolic panel; Future -     Microalbumin/Creatinine Ratio, Urine -     POCT Urinalysis DIP (Proadvantage Device)  History of non-ST elevation myocardial infarction (NSTEMI)  Essential hypertension  Vitamin D deficiency -     VITAMIN D 25 Hydroxy (Vit-D Deficiency, Fractures); Future  Other hyperlipidemia -     Lipid panel; Future  Decreased pulses in feet -     VAS [place] ABI WITH/WO TBI; Future  Deficiency anemia -     CBC with Differential/Platelet; Future  Hot flashes -     TSH; Future -     FSH/LH; Future -  Estrogens, Total; Future  Other orders -     Continuous Blood Gluc Sensor (DEXCOM G6 SENSOR) MISC; Apply 1 sensor every 10 days. -     Continuous Blood Gluc Transmit (DEXCOM G6 TRANSMITTER) MISC; use 1 transmitter every 90 days -     aspirin EC (ASPIRIN LOW DOSE) 81 MG tablet; Take 1 tablet (81 mg total) by mouth daily. -     rosuvastatin (CRESTOR) 40 MG tablet; Take 1 tablet (40 mg total) by mouth daily. -     rivaroxaban (XARELTO) 2.5 MG TABS tablet; Take 1 tablet (2.5 mg total) by mouth 2 (two) times daily. -     nitroGLYCERIN (NITROSTAT) 0.4 MG SL tablet; Place 1 tablet (0.4 mg total) under the tongue every 5 (five) minutes as needed for chest pain. -     metoprolol tartrate (LOPRESSOR) 25 MG tablet; Take 1 tablet (25 mg total) by mouth 2 (two) times daily. -     losartan (COZAAR) 50 MG tablet; Take 1 tablet (50 mg total) by mouth daily. -     Empagliflozin-metFORMIN HCl (SYNJARDY) 06-998 MG TABS; Take 1 tablet by mouth 2 (two) times daily.    Follow up: with fasting labs

## 2021-08-03 ENCOUNTER — Telehealth: Payer: Self-pay

## 2021-08-03 LAB — MICROALBUMIN / CREATININE URINE RATIO
Creatinine, Urine: 70.1 mg/dL
Microalb/Creat Ratio: 29 mg/g creat (ref 0–29)
Microalbumin, Urine: 20.2 ug/mL

## 2021-08-03 NOTE — Telephone Encounter (Signed)
P.McColl

## 2021-08-05 ENCOUNTER — Other Ambulatory Visit (HOSPITAL_COMMUNITY)
Admission: RE | Admit: 2021-08-05 | Discharge: 2021-08-05 | Disposition: A | Discharge status: Home / Self Care | Payer: 59 | Source: Ambulatory Visit | Attending: Medical | Admitting: Medical

## 2021-08-05 ENCOUNTER — Other Ambulatory Visit (HOSPITAL_COMMUNITY)
Admission: RE | Admit: 2021-08-05 | Discharge: 2021-08-05 | Disposition: A | Discharge status: Home / Self Care | Payer: 59 | Source: Ambulatory Visit | Attending: Podiatry | Admitting: Podiatry

## 2021-08-05 DIAGNOSIS — E7849 Other hyperlipidemia: Secondary | ICD-10-CM

## 2021-08-05 DIAGNOSIS — Z79899 Other long term (current) drug therapy: Secondary | ICD-10-CM | POA: Insufficient documentation

## 2021-08-05 DIAGNOSIS — E559 Vitamin D deficiency, unspecified: Secondary | ICD-10-CM

## 2021-08-05 DIAGNOSIS — E118 Type 2 diabetes mellitus with unspecified complications: Secondary | ICD-10-CM | POA: Diagnosis not present

## 2021-08-05 DIAGNOSIS — D539 Nutritional anemia, unspecified: Secondary | ICD-10-CM

## 2021-08-05 DIAGNOSIS — R232 Flushing: Secondary | ICD-10-CM | POA: Diagnosis not present

## 2021-08-05 LAB — COMPREHENSIVE METABOLIC PANEL
ALT: 51 U/L — ABNORMAL HIGH (ref 0–44)
AST: 57 U/L — ABNORMAL HIGH (ref 15–41)
Albumin: 4.8 g/dL (ref 3.5–5.0)
Alkaline Phosphatase: 90 U/L (ref 38–126)
Anion gap: 9 (ref 5–15)
BUN: 13 mg/dL (ref 6–20)
CO2: 21 mmol/L — ABNORMAL LOW (ref 22–32)
Calcium: 10 mg/dL (ref 8.9–10.3)
Chloride: 105 mmol/L (ref 98–111)
Creatinine, Ser: 1.01 mg/dL — ABNORMAL HIGH (ref 0.44–1.00)
GFR, Estimated: >60 mL/min (ref >60)
Glucose, Bld: 303 mg/dL — ABNORMAL HIGH (ref 70–99)
Potassium: 4.1 mmol/L (ref 3.5–5.1)
Sodium: 135 mmol/L (ref 135–145)
Total Bilirubin: 0.8 mg/dL (ref 0.3–1.2)
Total Protein: 8.7 g/dL — ABNORMAL HIGH (ref 6.5–8.1)

## 2021-08-05 LAB — CBC WITH DIFFERENTIAL/PLATELET
Abs Immature Granulocytes: 0.02 10*3/uL (ref 0.00–0.07)
Basophils Absolute: 0.1 10*3/uL (ref 0.0–0.1)
Basophils Relative: 1 %
Eosinophils Absolute: 0.1 10*3/uL (ref 0.0–0.5)
Eosinophils Relative: 1 %
HCT: 42.4 % (ref 36.0–46.0)
Hemoglobin: 14.3 g/dL (ref 12.0–15.0)
Immature Granulocytes: 0 %
Lymphocytes Relative: 30 %
Lymphs Abs: 3 10*3/uL (ref 0.7–4.0)
MCH: 31.4 pg (ref 26.0–34.0)
MCHC: 33.7 g/dL (ref 30.0–36.0)
MCV: 93 fL (ref 80.0–100.0)
Monocytes Absolute: 0.5 10*3/uL (ref 0.1–1.0)
Monocytes Relative: 5 %
Neutro Abs: 6.4 10*3/uL (ref 1.7–7.7)
Neutrophils Relative %: 63 %
Platelets: 228 10*3/uL (ref 150–400)
RBC: 4.56 MIL/uL (ref 3.87–5.11)
RDW: 12.1 % (ref 11.5–15.5)
WBC: 10.1 10*3/uL (ref 4.0–10.5)
nRBC: 0 % (ref 0.0–0.2)

## 2021-08-05 LAB — HEPATIC FUNCTION PANEL
ALT: 50 U/L — ABNORMAL HIGH (ref 0–44)
AST: 57 U/L — ABNORMAL HIGH (ref 15–41)
Albumin: 4.9 g/dL (ref 3.5–5.0)
Alkaline Phosphatase: 90 U/L (ref 38–126)
Bilirubin, Direct: 0.1 mg/dL (ref 0.0–0.2)
Indirect Bilirubin: 0.7 mg/dL (ref 0.3–0.9)
Total Bilirubin: 0.8 mg/dL (ref 0.3–1.2)
Total Protein: 9 g/dL — ABNORMAL HIGH (ref 6.5–8.1)

## 2021-08-05 LAB — LIPID PANEL
Cholesterol: 142 mg/dL (ref 0–200)
HDL: 47 mg/dL (ref >40)
LDL Cholesterol: 79 mg/dL (ref 0–99)
Total CHOL/HDL Ratio: 3 RATIO
Triglycerides: 82 mg/dL (ref <150)
VLDL: 16 mg/dL (ref 0–40)

## 2021-08-05 LAB — TSH: TSH: 0.794 u[IU]/mL (ref 0.350–4.500)

## 2021-08-05 LAB — HEMOGLOBIN A1C
Hgb A1c MFr Bld: 12 % — ABNORMAL HIGH (ref 4.8–5.6)
Mean Plasma Glucose: 297.7 mg/dL

## 2021-08-05 LAB — VITAMIN D 25 HYDROXY (VIT D DEFICIENCY, FRACTURES): Vit D, 25-Hydroxy: 23.19 ng/mL — ABNORMAL LOW (ref 30–100)

## 2021-08-06 ENCOUNTER — Other Ambulatory Visit: Payer: Self-pay | Admitting: Medical

## 2021-08-06 ENCOUNTER — Other Ambulatory Visit (HOSPITAL_COMMUNITY): Payer: Self-pay

## 2021-08-06 LAB — FSH/LH
FSH: 69.7 m[IU]/mL
LH: 47.6 m[IU]/mL

## 2021-08-06 MED ORDER — VITAMIN D 50 MCG (2000 UT) PO CAPS
1.0000 | ORAL_CAPSULE | Freq: Every day | ORAL | 3 refills | Status: DC
  Filled 2021-08-06: qty 90, 90d supply, fill #0

## 2021-08-07 ENCOUNTER — Other Ambulatory Visit: Payer: Self-pay | Admitting: Medical

## 2021-08-07 ENCOUNTER — Telehealth: Payer: Self-pay | Admitting: Medical

## 2021-08-07 ENCOUNTER — Other Ambulatory Visit: Payer: Self-pay | Admitting: Internal Medicine

## 2021-08-07 ENCOUNTER — Other Ambulatory Visit (HOSPITAL_COMMUNITY): Payer: Self-pay

## 2021-08-07 MED ORDER — SOLIQUA 100-33 UNT-MCG/ML ~~LOC~~ SOPN
15.0000 [IU] | PEN_INJECTOR | Freq: Every day | SUBCUTANEOUS | 2 refills | Status: DC
  Filled 2021-08-07 (×2): qty 3, 20d supply, fill #0
  Filled 2021-08-28: qty 3, 20d supply, fill #1
  Filled 2021-09-17: qty 3, 20d supply, fill #2

## 2021-08-07 MED ORDER — SYNJARDY XR 5-1000 MG PO TB24
2.0000 | ORAL_TABLET | Freq: Every day | ORAL | 0 refills | Status: DC
  Filled 2021-08-07: qty 90, 45d supply, fill #0

## 2021-08-07 MED ORDER — INSULIN PEN NEEDLE 32G X 4 MM MISC
0 refills | Status: DC
  Filled 2021-08-07: qty 100, 90d supply, fill #0

## 2021-08-07 MED ORDER — INSULIN PEN NEEDLE 32G X 4 MM MISC
1 refills | Status: DC
Start: 2021-08-07 — End: 2022-04-08
  Filled 2021-08-07: qty 100, 90d supply, fill #0

## 2021-08-07 NOTE — Progress Notes (Signed)
Subjective:  Patient ID: Jennifer Cervantes, female    DOB: 05/14/1980,  MRN: [id number]  No chief complaint on file.   41 y.o. female presents with the above complaint.  Patient presents with complaint of right heel pain that has been on for few months has progressive gotten worse.  Hurts with ambulation.  She is a diabetic with last A1c of 7.6.  She wanted to get it evaluated.  She states it hurts with taking for step in the morning.  Pain scale 7 out of 10.  She also has secondary complaint of left 1 through 5 onychomycosis.  She would like to discuss treatment options for this.  She said that thicker dystrophic elongated.  She has not seen anyone else prior to seeing me for this.   Review of Systems: Negative except as noted in the HPI. Denies N/V/F/Ch.  Past Medical History:  Diagnosis Date   Anemia    CAD (coronary artery disease)    a. LHC on 06/21/15 which showed 75% occl mid-dist LCx, 40% occl mRCA, 99% mid-dist LAD s/p DES and 50% occl mLAD s/p DES (overlapping stents) and significant LV dysfunction with anteroapical HK; b. 06/2015 Myoview: EF 55-65%, no ischemia/infarct.   CHF (congestive heart failure) (HCC)    Complication of anesthesia    adverse reaction to anesthesia-wakes up very confused, agitated, getting out of bed with eyes closed   GERD (gastroesophageal reflux disease)    Hyperlipidemia    Hypertension    Hypothyroidism    Myocardial stunning    a. EF 45% on 2D ECHO on 06/21/15 and severe LV dysfunction on LV gram by cath. Repeat 2D ECHO with EF 55-60% and mild HK of the apicoseptal myocardium.   Neuropraxia of right median nerve    a. suspected after right radial cath. will follow with neuro outpatient if does not resolve.    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 05/2015 Following PCI (radial access)-->conservatively managed.   Tobacco abuse    Tricuspid regurgitation     Type II diabetes mellitus ([place])    a. Type II   Vitamin D deficiency     Current Outpatient Medications:    acetaminophen (TYLENOL) 500 MG tablet, Take 500 mg by mouth every 6 (six) hours as needed for headache (pain)., Disp: , Rfl:    aspirin EC (ASPIRIN LOW DOSE) 81 MG tablet, Take 1 tablet (81 mg total) by mouth daily., Disp: 90 tablet, Rfl: 3   Cholecalciferol (VITAMIN D) 50 MCG (2000 UT) CAPS, Take 1 capsule (2,000 Units total) by mouth daily., Disp: 90 capsule, Rfl: 3   Continuous Blood Gluc Sensor (DEXCOM G6 SENSOR) MISC, Apply 1 sensor every 10 days., Disp: 4 each, Rfl: 11   Continuous Blood Gluc Transmit (DEXCOM G6 TRANSMITTER) MISC, use 1 transmitter every 90 days, Disp: 4 each, Rfl: 11   Empagliflozin-metFORMIN HCl ER (SYNJARDY XR) 06-998 MG TB24, Take 2 tablets by mouth daily., Disp: 90 tablet, Rfl: 0   HYDROcodone-acetaminophen (NORCO/VICODIN) 5-325 MG tablet, Take 1 tablet by mouth every 6 (six) hours as needed for moderate pain or severe pain., Disp: 10 tablet, Rfl: 0   Insulin Glargine-Lixisenatide (SOLIQUA) 100-33 UNT-MCG/ML SOPN, Inject 15 Units into the skin daily., Disp: 3 mL, Rfl: 2   losartan (COZAAR) 50 MG tablet, Take 1 tablet (50 mg total) by mouth daily., Disp: 90 tablet, Rfl: 0  metoprolol tartrate (LOPRESSOR) 25 MG tablet, Take 1 tablet (25 mg total) by mouth 2 (two) times daily., Disp: 180 tablet, Rfl: 0   nitroGLYCERIN (NITROSTAT) 0.4 MG SL tablet, Place 1 tablet (0.4 mg total) under the tongue every 5 (five) minutes as needed for chest pain., Disp: 25 tablet, Rfl: 6   rivaroxaban (XARELTO) 2.5 MG TABS tablet, Take 1 tablet (2.5 mg total) by mouth 2 (two) times daily., Disp: 60 tablet, Rfl: 0   rosuvastatin (CRESTOR) 40 MG tablet, Take 1 tablet (40 mg total) by mouth daily., Disp: 90 tablet, Rfl: 3  Social History   Tobacco Use  Smoking Status Former   Packs/day: 0.50   Years: 23.00   Total pack years: 11.50   Types: Cigarettes   Quit date: 12/19/2020    Years since quitting: 0.6  Smokeless Tobacco Never    No Known Allergies Objective:  There were no vitals filed for this visit. There is no height or weight on file to calculate BMI. Constitutional Well developed. Well nourished.  Vascular Dorsalis pedis pulses palpable bilaterally. Posterior tibial pulses palpable bilaterally. Capillary refill normal to all digits.  No cyanosis or clubbing noted. Pedal hair growth normal.  Neurologic Normal speech. Oriented to person, place, and time. Epicritic sensation to light touch grossly present bilaterally.  Dermatologic Nails well groomed and normal in appearance. No open wounds. No skin lesions.  Orthopedic: Normal joint ROM without pain or crepitus bilaterally. No visible deformities. Tender to palpation at the calcaneal tuber right. No pain with calcaneal squeeze right. Ankle ROM diminished range of motion right. Silfverskiold Test: positive right.   Radiographs: Taken and reviewed. No acute fractures or dislocations. No evidence of stress fracture.  Plantar heel spur present. Posterior heel spur present.  Pes planovalgus foot structure noted Assessment:   1. Long-term use of high-risk medication   2. Plantar fasciitis of right foot   3. Nail fungus   4. Onychomycosis due to dermatophyte    Plan:  Patient was evaluated and treated and all questions answered.  Left 1 through 5 onychomycosis -Educated the patient on the etiology of onychomycosis and various treatment options associated with improving the fungal load.  I explained to the patient that there is 3 treatment options available to treat the onychomycosis including topical, p.o., laser treatment.  Patient elected to undergo p.o. options with Lamisil/terbinafine therapy.  In order for me to start the medication therapy, I explained to the patient the importance of evaluating the liver and obtaining the liver function test.  Once the liver function test comes back normal I  will start him on 34-monthcourse of Lamisil therapy.  Patient understood all risk and would like to proceed with Lamisil therapy.  I have asked the patient to immediately stop the Lamisil therapy if she has any reactions to it and call the office or go to the emergency room right away.  Patient states understanding   Plantar Fasciitis, right - XR reviewed as above.  - Educated on icing and stretching. Instructions given.  - Injection delivered to the plantar fascia as below. - DME: Plantar fascial brace dispensed to support the medial longitudinal arch of the foot and offload pressure from the heel and prevent arch collapse during weightbearing - Pharmacologic management: None  Procedure: Injection Tendon/Ligament Location: Right plantar fascia at the glabrous junction; medial approach. Skin Prep: alcohol Injectate: 0.5 cc 0.5% marcaine plain, 0.5 cc of 1% Lidocaine, 0.5 cc kenalog 10. Disposition: Patient tolerated procedure well. Injection site  dressed with a band-aid.  No follow-ups on file.

## 2021-08-07 NOTE — Telephone Encounter (Signed)
Vascular Ultrasound 

## 2021-08-08 ENCOUNTER — Encounter (HOSPITAL_COMMUNITY): Payer: 59

## 2021-08-09 ENCOUNTER — Other Ambulatory Visit (HOSPITAL_COMMUNITY): Payer: Self-pay

## 2021-08-09 LAB — ESTROGENS, TOTAL: Estrogen: 83 pg/mL

## 2021-08-09 NOTE — Telephone Encounter (Signed)
Both Sensor and transmittor approved til 08/03/22, Gabriel Cirri informed pt

## 2021-08-15 ENCOUNTER — Ambulatory Visit (HOSPITAL_COMMUNITY)
Admission: RE | Admit: 2021-08-15 | Discharge: 2021-08-15 | Disposition: A | Discharge status: Home / Self Care | Payer: 59 | Source: Ambulatory Visit | Attending: Medical | Admitting: Medical

## 2021-08-15 DIAGNOSIS — R0989 Other specified symptoms and signs involving the circulatory and respiratory systems: Secondary | ICD-10-CM | POA: Diagnosis not present

## 2021-08-23 NOTE — Progress Notes (Signed)
CARDIOLOGY CONSULT NOTE       Patient ID: Jennifer Cervantes MRN: [id number] DOB/AGE: September 05, 1980 41 y.o.  Referring Physician: Johnsie Cancel Primary Physician: Carlena Hurl, PA-C Primary Cardiologist: new Reason for Consultation: CAD  Active Problems:   * No active hospital problems. *   HPI:  41 y.o. new to me Previously seen by DR Bronson Ing. 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 EF preserved 55-65% When last seen by Dr Raliegh Ip had more atypical pains Myovue 06/16/19 normal with no ischemia EF 68% She has had right radial occlusion after multiple caths and CTA 10/07/17 showed dominant ulnar run off in right hand She was started back on beta blocker in April She has seen [facility] Dr Biagio Quint and had multiple nerve condution studies and MRI in regard to right arm pain/swelling   Has 3 older kids but she and the kids have moved back in with parents She works as a Network engineer on 3 rd floor [employer]  Diagnosed with breast cancer Had right breast lumpectomy 01/09/21   Diabetes poorly controlled with A1c 12 08/05/21   08/02/21 had injection right foot for plantar fascitis   She is on losartan for renal protection given DM and lopressor for her CAD I explained to her that these are not necessarily for HTN  Not clear why she is on low dose xarelto Seems to have been started in 2017 with MI ? To prevent apical thrombus    ROS All other systems reviewed and negative except as noted above  Past Medical History:  Diagnosis Date   Anemia    CAD (coronary artery disease)    a. LHC on 06/21/15 which showed 75% occl mid-dist LCx, 40% occl mRCA, 99% mid-dist LAD s/p DES and 50% occl mLAD s/p DES (overlapping stents) and significant LV dysfunction with anteroapical HK; b. 06/2015 Myoview: EF 55-65%, no ischemia/infarct.   CHF (congestive heart failure) (HCC)     Complication of anesthesia    adverse reaction to anesthesia-wakes up very confused, agitated, getting out of bed with eyes closed   GERD (gastroesophageal reflux disease)    Hyperlipidemia    Hypertension    Hypothyroidism    Myocardial stunning    a. EF 45% on 2D ECHO on 06/21/15 and severe LV dysfunction on LV gram by cath. Repeat 2D ECHO with EF 55-60% and mild HK of the apicoseptal myocardium.   Neuropraxia of right median nerve    a. suspected after right radial cath. will follow with neuro outpatient if does not resolve.    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 05/2015 Following PCI (radial access)-->conservatively managed.   Tobacco abuse    Tricuspid regurgitation    Type II diabetes mellitus ([place])    a. Type II   Vitamin D deficiency     Family History  Problem Relation Age of Onset   Diabetes Mother    Healthy Father    Healthy Sister    Healthy Brother    Diabetes Maternal Grandmother    Heart disease Maternal Grandmother    Diabetes Maternal Aunt        x 2   Heart attack Neg Hx    Heart failure Neg Hx    Stroke Neg Hx  Hyperlipidemia Neg Hx    Hypertension Neg Hx    Stomach cancer Neg Hx    Colon cancer Neg Hx     Social History   Socioeconomic History   Marital status: Single    Spouse name: Not on file   Number of children: 4   Years of education: 12   Highest education level: Not on file  Occupational History   Occupation: CNA    Employer: [employer]  Tobacco Use   Smoking status: Former    Packs/day: 0.50    Years: 23.00    Total pack years: 11.50    Types: Cigarettes    Quit date: 12/19/2020    Years since quitting: 0.6   Smokeless tobacco: Never  Vaping Use   Vaping Use: Never used  Substance and Sexual Activity   Alcohol use: No    Alcohol/week: 0.0 standard drinks of alcohol   Drug use: No   Sexual activity: Not on file  Other Topics  Concern   Not on file  Social History Narrative   Lives in [place], works nights at [employer], [place]   Right-handed   Drinks 1 soda a day   Exercises some with walking   Lives with her son   Laverle Hobby her space, doesn't want a significant other   05/2019   Social Determinants of Health   Financial Resource Strain: Not on file  Food Insecurity: Not on file  Transportation Needs: Not on file  Physical Activity: Not on file  Stress: Not on file  Social Connections: Not on file  Intimate Partner Violence: Not on file    Past Surgical History:  Procedure Laterality Date   24 HOUR [place] STUDY N/A 07/14/2016   Procedure: 24 HOUR [place];  Surgeon: Mauri Pole, MD;  Location: [facility];  Service: Endoscopy;  Laterality: N/A;   BREAST LUMPECTOMY WITH RADIOACTIVE SEED LOCALIZATION Right 01/09/2021   Procedure: RIGHT BREAST LUMPECTOMY WITH RADIOACTIVE SEED LOCALIZATION;  Surgeon: Jovita Kussmaul, MD;  Location: [facility];  Service: General;  Laterality: Right;   CARDIAC CATHETERIZATION N/A 06/21/2015   Procedure: Left Heart Cath and Coronary Angiography;  Surgeon: Belva Crome, MD; pLAD 99%, CFX 75%, RCA 40%, EF 35%    CARDIAC CATHETERIZATION N/A 06/21/2015   Procedure: Coronary Stent Intervention;  Surgeon: Belva Crome, MD;  Promus Premier DES, 3.0 x 20 and 3.5 x 32 mm stents to the LAD   CARDIAC CATHETERIZATION N/A 06/21/2015   Procedure: Intravascular Ultrasound/IVUS;  Surgeon: Belva Crome, MD;  Location: [facility];  Service: Cardiovascular;  Laterality: N/A;  LAD   CARDIAC CATHETERIZATION  06/22/2015   Procedure: Coronary/Graft Angiography;  Surgeon: Cebastian Neis M [place], MD;  Location: [facility];  Service: Cardiovascular;;   CORONARY STENT INTERVENTION N/A 04/02/2016   Procedure: Coronary Stent Intervention;  Surgeon: Jettie Booze, MD;  Location: [facility];  Service: Cardiovascular;  Laterality: N/A;   ESOPHAGEAL MANOMETRY N/A 07/14/2016    Procedure: ESOPHAGEAL MANOMETRY (EM);  Surgeon: Mauri Pole, MD;  Location: [facility];  Service: Endoscopy;  Laterality: N/A;   LEFT HEART CATH AND CORONARY ANGIOGRAPHY N/A 04/02/2016   Procedure: Left Heart Cath and Coronary Angiography;  Surgeon: Jettie Booze, MD;  Location: [facility];  Service: Cardiovascular;  Laterality: N/A;   LEFT HEART CATH AND CORONARY ANGIOGRAPHY N/A 07/17/2016   Procedure: Left Heart Cath and Coronary Angiography;  Surgeon: Leonie Man, MD;  Location: [facility];  Service: Cardiovascular;  Laterality: N/A;      Current Outpatient Medications:    acetaminophen (TYLENOL) 500 MG tablet, Take 500 mg by mouth every 6 (six) hours as needed for headache (pain)., Disp: , Rfl:    aspirin EC (ASPIRIN LOW DOSE) 81 MG tablet, Take 1 tablet (81 mg total) by mouth daily., Disp: 90 tablet, Rfl: 3   Cholecalciferol (VITAMIN D) 50 MCG (2000 UT) CAPS, Take 1 capsule (2,000 Units total) by mouth daily., Disp: 90 capsule, Rfl: 3   Continuous Blood Gluc Sensor (DEXCOM G6 SENSOR) MISC, Apply 1 sensor every 10 days., Disp: 4 each, Rfl: 11   Continuous Blood Gluc Transmit (DEXCOM G6 TRANSMITTER) MISC, use 1 transmitter every 90 days, Disp: 4 each, Rfl: 11   Empagliflozin-metFORMIN HCl ER (SYNJARDY XR) 06-998 MG TB24, Take 2 tablets by mouth daily., Disp: 90 tablet, Rfl: 0   HYDROcodone-acetaminophen (NORCO/VICODIN) 5-325 MG tablet, Take 1 tablet by mouth every 6 (six) hours as needed for moderate pain or severe pain., Disp: 10 tablet, Rfl: 0   Insulin Glargine-Lixisenatide (SOLIQUA) 100-33 UNT-MCG/ML SOPN, Inject 15 Units into the skin daily., Disp: 3 mL, Rfl: 2   Insulin Pen Needle 32G X 4 MM MISC, Use for insulin injection, Disp: 100 each, Rfl: 1   Insulin Pen Needle 32G X 4 MM MISC, Use with Soliqua daily, Disp: 100 each, Rfl: 0   losartan (COZAAR) 50 MG tablet, Take 1 tablet (50 mg total) by mouth daily., Disp: 90 tablet, Rfl: 0   metoprolol tartrate  (LOPRESSOR) 25 MG tablet, Take 1 tablet (25 mg total) by mouth 2 (two) times daily., Disp: 180 tablet, Rfl: 0   nitroGLYCERIN (NITROSTAT) 0.4 MG SL tablet, Place 1 tablet (0.4 mg total) under the tongue every 5 (five) minutes as needed for chest pain., Disp: 25 tablet, Rfl: 6   rivaroxaban (XARELTO) 2.5 MG TABS tablet, Take 1 tablet (2.5 mg total) by mouth 2 (two) times daily., Disp: 60 tablet, Rfl: 0   rosuvastatin (CRESTOR) 40 MG tablet, Take 1 tablet (40 mg total) by mouth daily., Disp: 90 tablet, Rfl: 3    Physical Exam: There were no vitals taken for this visit.    Affect appropriate Chronically ill black female  HEENT: normal Neck supple with no adenopathy JVP normal no bruits no thyromegaly Lungs clear with no wheezing and good diaphragmatic motion Heart:  S1/S2 no murmur, no rub, gallop or click PMI normal Abdomen: benighn, BS positve, no tenderness, no AAA no bruit.  No HSM or HJR Absent right radial pulse good ulnar with normal palmer perfusion  No edema Neuro decreased sensation right forearm/hand chronic  Skin warm and dry No muscular weakness   Labs:   Lab Results  Component Value Date   WBC 10.1 08/05/2021   HGB 14.3 08/05/2021   HCT 42.4 08/05/2021   MCV 93.0 08/05/2021   PLT 228 08/05/2021   No results for input(s): "NA", "K", "CL", "CO2", "BUN", "CREATININE", "CALCIUM", "PROT", "BILITOT", "ALKPHOS", "ALT", "AST", "GLUCOSE" in the last 168 hours.  Invalid input(s): "LABALBU" Lab Results  Component Value Date   TROPONINI <0.03 05/25/2017    Lab Results  Component Value Date   CHOL 142 08/05/2021   CHOL 139 06/20/2020   CHOL 162 06/13/2019   Lab Results  Component Value Date   HDL 47 08/05/2021   HDL 47 06/20/2020   HDL 54 06/13/2019   Lab Results  Component Value Date   LDLCALC 79  08/05/2021   LDLCALC 76 06/20/2020   LDLCALC 93 06/13/2019   Lab Results  Component Value Date   TRIG 82 08/05/2021   TRIG 80 06/20/2020   TRIG 79 06/13/2019    Lab Results  Component Value Date   CHOLHDL 3.0 08/05/2021   CHOLHDL 3.0 06/20/2020   CHOLHDL 3.0 06/13/2019   No results found for: "LDLDIRECT"    Radiology: VAS [place] ABI WITH/WO TBI  Result Date: 08/15/2021  LOWER EXTREMITY DOPPLER STUDY Patient Name:  RAVAN SCHLEMMER  Date of Exam:   08/15/2021 Medical Rec #: [id number]            Accession #:    [id number] Date of Birth: 09/18/1980             Patient Gender: F Patient Age:   79 years Exam Location:  [facility] Procedure:      VAS [place] ABI WITH/WO TBI Referring Phys: DAVID TYSINGER --------------------------------------------------------------------------------  Indications: "Decreased pedal pulses". High Risk Factors: Hypertension, hyperlipidemia, Diabetes, past history of                    smoking, coronary artery disease.  Comparison Study: None Performing Technologist: Ivan Croft  Examination Guidelines: A complete evaluation includes at minimum, Doppler waveform signals and systolic blood pressure reading at the level of bilateral brachial, anterior tibial, and posterior tibial arteries, when vessel segments are accessible. Bilateral testing is considered an integral part of a complete examination. Photoelectric Plethysmograph (PPG) waveforms and toe systolic pressure readings are included as required and additional duplex testing as needed. Limited examinations for reoccurring indications may be performed as noted.  ABI Findings: +---------+------------------+-----+--------+--------+ Right    Rt Pressure (mmHg)IndexWaveformComment  +---------+------------------+-----+--------+--------+ PTA      114               1.10 biphasic         +---------+------------------+-----+--------+--------+ DP       118               1.13 biphasic         +---------+------------------+-----+--------+--------+ Great Toe62                0.60                  +---------+------------------+-----+--------+--------+  +---------+------------------+-----+---------+-------+ Left     Lt Pressure (mmHg)IndexWaveform Comment +---------+------------------+-----+---------+-------+ Brachial 104                                     +---------+------------------+-----+---------+-------+ PTA      114               1.10 triphasic        +---------+------------------+-----+---------+-------+ DP       120               1.15 triphasic        +---------+------------------+-----+---------+-------+ Great Toe95                0.91                  +---------+------------------+-----+---------+-------+ +-------+-----------+-----------+------------+------------+ ABI/TBIToday's ABIToday's TBIPrevious ABIPrevious TBI +-------+-----------+-----------+------------+------------+ Right  1.13       0.60                                +-------+-----------+-----------+------------+------------+ Left  1.15       0.91                                +-------+-----------+-----------+------------+------------+   Summary: Right: Resting right ankle-brachial index is within normal range. No evidence of significant right lower extremity arterial disease. The right toe-brachial index is abnormal. Left: Resting left ankle-brachial index is within normal range. No evidence of significant left lower extremity arterial disease. The left toe-brachial index is normal. *See table(s) above for measurements and observations.  Electronically signed by Deitra Mayo MD on 08/15/2021 at 1:30:20 PM.    Final     EKG: SR rate 89 normal 06/07/19    ASSESSMENT AND PLAN:   1. CAD: post multiple stents to LAD most recently 07/17/16 with normal myovue no ischemia 06/16/19 continue medical Rx Avoid right arm for future catheterizations No need for low dose xarelto can dc  2. Right Arm Pain: chronic perfused by ulnar f/u [facility] Early   3. HTN:  Well controlled.  Continue current medications and low sodium Dash type diet.    4.  HLD on statin labs with primary   5. DM:  Discussed low carb diet.  Target hemoglobin A1c is 6.5 or less.  Continue current medications.Poor control with recent A1c 12   6. Anticoagulation:  not clear why she is still on low dose xarelto EF has normalized favor d/c and continue Rx with ASA   F/U in a year    Signed: Jenkins Rouge 08/23/2021, 2:07 PM

## 2021-08-28 ENCOUNTER — Encounter: Payer: Self-pay | Admitting: Cardiovascular Disease

## 2021-08-28 ENCOUNTER — Other Ambulatory Visit (HOSPITAL_COMMUNITY): Payer: Self-pay

## 2021-08-28 ENCOUNTER — Ambulatory Visit: Payer: 59 | Admitting: Cardiovascular Disease

## 2021-08-28 VITALS — BP 100/64 | HR 86 | Ht 69.0 in | Wt 218.9 lb

## 2021-08-28 DIAGNOSIS — Z794 Long term (current) use of insulin: Secondary | ICD-10-CM | POA: Diagnosis not present

## 2021-08-28 DIAGNOSIS — E1165 Type 2 diabetes mellitus with hyperglycemia: Secondary | ICD-10-CM

## 2021-08-28 DIAGNOSIS — I2510 Atherosclerotic heart disease of native coronary artery without angina pectoris: Secondary | ICD-10-CM

## 2021-08-28 DIAGNOSIS — I10 Essential (primary) hypertension: Secondary | ICD-10-CM | POA: Diagnosis not present

## 2021-08-28 MED ORDER — METOPROLOL TARTRATE 25 MG PO TABS
25.0000 mg | ORAL_TABLET | Freq: Two times a day (BID) | ORAL | 3 refills | Status: DC
  Filled 2021-08-28 – 2022-07-04 (×3): qty 180, 90d supply, fill #0

## 2021-08-28 NOTE — Patient Instructions (Signed)
Medication Instructions:   Stop Taking Xarelto   *If you need a refill on your cardiac medications before your next appointment, please call your pharmacy*   Lab Work: NONE   If you have labs (blood work) drawn today and your tests are completely normal, you will receive your results only by: [place] (if you have MyChart) OR A paper copy in the mail If you have any lab test that is abnormal or we need to change your treatment, we will call you to review the results.   Testing/Procedures: NONE    Follow-Up: At [facility], you and your health needs are our priority.  As part of our continuing mission to provide you with exceptional heart care, we have created designated Provider Care Teams.  These Care Teams include your primary Cardiologist (physician) and Advanced Practice Providers (APPs -  Physician Assistants and Nurse Practitioners) who all work together to provide you with the care you need, when you need it.  We recommend signing up for the patient portal called "MyChart".  Sign up information is provided on this After Visit Summary.  MyChart is used to connect with patients for Virtual Visits (Telemedicine).  Patients are able to view lab/test results, encounter notes, upcoming appointments, etc.  Non-urgent messages can be sent to your provider as well.   To learn more about what you can do with MyChart, go to [email or web address].    Your next appointment:   1 year(s)  The format for your next appointment:   In Person  Provider:   Jenkins Rouge, MD    Other Instructions Thank you for choosing [facility]!    Important Information About Sugar

## 2021-08-30 ENCOUNTER — Ambulatory Visit: Payer: 59 | Admitting: Podiatry

## 2021-09-04 ENCOUNTER — Ambulatory Visit: Payer: 59 | Admitting: Podiatry

## 2021-09-04 DIAGNOSIS — M722 Plantar fascial fibromatosis: Secondary | ICD-10-CM | POA: Diagnosis not present

## 2021-09-04 DIAGNOSIS — Q666 Other congenital valgus deformities of feet: Secondary | ICD-10-CM

## 2021-09-04 NOTE — Progress Notes (Signed)
Subjective:  Patient ID: Jennifer Cervantes, female    DOB: 07-14-1980,  MRN: [id number]  Chief Complaint  Patient presents with   Plantar Fasciitis    41 y.o. female presents with the above complaint.  Patient presents with right Planter fasciitis.  She states that she is a diabetic with A1c of 12.  She states it still hurts in the heel and the injection did not help much.  She wanted to discuss orthotics management as well as cam boot immobilization.  She denies any other acute complaints.   Review of Systems: Negative except as noted in the HPI. Denies N/V/F/Ch.  Past Medical History:  Diagnosis Date   Anemia    CAD (coronary artery disease)    a. LHC on 06/21/15 which showed 75% occl mid-dist LCx, 40% occl mRCA, 99% mid-dist LAD s/p DES and 50% occl mLAD s/p DES (overlapping stents) and significant LV dysfunction with anteroapical HK; b. 06/2015 Myoview: EF 55-65%, no ischemia/infarct.   CHF (congestive heart failure) (HCC)    Complication of anesthesia    adverse reaction to anesthesia-wakes up very confused, agitated, getting out of bed with eyes closed   GERD (gastroesophageal reflux disease)    Hyperlipidemia    Hypertension    Hypothyroidism    Myocardial stunning    a. EF 45% on 2D ECHO on 06/21/15 and severe LV dysfunction on LV gram by cath. Repeat 2D ECHO with EF 55-60% and mild HK of the apicoseptal myocardium.   Neuropraxia of right median nerve    a. suspected after right radial cath. will follow with neuro outpatient if does not resolve.    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 05/2015 Following PCI (radial access)-->conservatively managed.   Tobacco abuse    Tricuspid regurgitation    Type II diabetes mellitus ([place])    a. Type II   Vitamin D deficiency     Current Outpatient Medications:    acetaminophen (TYLENOL) 500 MG tablet, Take 500 mg by mouth every 6 (six) hours  as needed for headache (pain)., Disp: , Rfl:    aspirin EC (ASPIRIN LOW DOSE) 81 MG tablet, Take 1 tablet (81 mg total) by mouth daily., Disp: 90 tablet, Rfl: 3   Cholecalciferol (VITAMIN D) 50 MCG (2000 UT) CAPS, Take 1 capsule (2,000 Units total) by mouth daily., Disp: 90 capsule, Rfl: 3   Continuous Blood Gluc Sensor (DEXCOM G6 SENSOR) MISC, Apply 1 sensor every 10 days., Disp: 4 each, Rfl: 11   Continuous Blood Gluc Transmit (DEXCOM G6 TRANSMITTER) MISC, use 1 transmitter every 90 days, Disp: 4 each, Rfl: 11   Empagliflozin-metFORMIN HCl ER (SYNJARDY XR) 06-998 MG TB24, Take 2 tablets by mouth daily., Disp: 90 tablet, Rfl: 0   HYDROcodone-acetaminophen (NORCO/VICODIN) 5-325 MG tablet, Take 1 tablet by mouth every 6 (six) hours as needed for moderate pain or severe pain., Disp: 10 tablet, Rfl: 0   Insulin Glargine-Lixisenatide (SOLIQUA) 100-33 UNT-MCG/ML SOPN, Inject 15 Units into the skin daily., Disp: 3 mL, Rfl: 2   Insulin Pen Needle 32G X 4 MM MISC, Use for insulin injection, Disp: 100 each, Rfl: 1   Insulin Pen Needle 32G X 4 MM MISC, Use with Soliqua daily, Disp: 100 each, Rfl: 0   losartan (COZAAR) 50 MG tablet, Take 1 tablet (50 mg total) by mouth daily., Disp: 90 tablet, Rfl: 0   metoprolol tartrate (LOPRESSOR)  25 MG tablet, Take 1 tablet (25 mg total) by mouth 2 (two) times daily., Disp: 180 tablet, Rfl: 3   nitroGLYCERIN (NITROSTAT) 0.4 MG SL tablet, Place 1 tablet (0.4 mg total) under the tongue every 5 (five) minutes as needed for chest pain., Disp: 25 tablet, Rfl: 6   rosuvastatin (CRESTOR) 40 MG tablet, Take 1 tablet (40 mg total) by mouth daily., Disp: 90 tablet, Rfl: 3  Social History   Tobacco Use  Smoking Status Former   Packs/day: 0.50   Years: 23.00   Total pack years: 11.50   Types: Cigarettes   Quit date: 12/19/2020   Years since quitting: 0.7  Smokeless Tobacco Never    No Known Allergies Objective:  There were no vitals filed for this visit. There is no  height or weight on file to calculate BMI. Constitutional Well developed. Well nourished.  Vascular Dorsalis pedis pulses palpable bilaterally. Posterior tibial pulses palpable bilaterally. Capillary refill normal to all digits.  No cyanosis or clubbing noted. Pedal hair growth normal.  Neurologic Normal speech. Oriented to person, place, and time. Epicritic sensation to light touch grossly present bilaterally.  Dermatologic Nails well groomed and normal in appearance. No open wounds. No skin lesions.  Orthopedic: Normal joint ROM without pain or crepitus bilaterally. No visible deformities. Tender to palpation at the calcaneal tuber right. No pain with calcaneal squeeze right. Ankle ROM diminished range of motion right. Silfverskiold Test: positive right.   Radiographs: Taken and reviewed. No acute fractures or dislocations. No evidence of stress fracture.  Plantar heel spur present. Posterior heel spur present.  Pes planovalgus foot structure noted Assessment:   1. Pes planovalgus   2. Plantar fasciitis of right foot     Plan:  Patient was evaluated and treated and all questions answered.  Left 1 through 5 onychomycosis -Educated the patient on the etiology of onychomycosis and various treatment options associated with improving the fungal load.  I explained to the patient that there is 3 treatment options available to treat the onychomycosis including topical, p.o., laser treatment.  Patient elected to undergo p.o. options with Lamisil/terbinafine therapy.  In order for me to start the medication therapy, I explained to the patient the importance of evaluating the liver and obtaining the liver function test.  Once the liver function test comes back normal I will start him on 35-monthcourse of Lamisil therapy.  Patient understood all risk and would like to proceed with Lamisil therapy.  I have asked the patient to immediately stop the Lamisil therapy if she has any reactions to it  and call the office or go to the emergency room right away.  Patient states understanding  Pes planovalgus -I explained to patient the etiology of pes planovalgus and relationship with Planter fasciitis and various treatment options were discussed.  Given patient foot structure in the setting of Planter fasciitis I believe patient will benefit from custom-made orthotics to help control the hindfoot motion support the arch of the foot and take the stress away from plantar fascial.  Patient agrees with the plan like to proceed with orthotics -Patient was casted for orthotics with offloading of submetatarsal 4    Plantar Fasciitis, right - XR reviewed as above.  - Educated on icing and stretching. Instructions given.  -No further injections as they have not helped - DME: Cam boot immobilization - Pharmacologic management: None   No follow-ups on file.

## 2021-09-17 ENCOUNTER — Other Ambulatory Visit (HOSPITAL_COMMUNITY): Payer: Self-pay

## 2021-09-17 ENCOUNTER — Other Ambulatory Visit: Payer: Self-pay | Admitting: Medical

## 2021-09-17 NOTE — Telephone Encounter (Signed)
Lvm for pt to advise if she need the soliqua. [place]

## 2021-09-18 ENCOUNTER — Other Ambulatory Visit (HOSPITAL_COMMUNITY): Payer: Self-pay

## 2021-09-18 ENCOUNTER — Other Ambulatory Visit: Payer: Self-pay | Admitting: Medical

## 2021-09-18 MED ORDER — SOLIQUA 100-33 UNT-MCG/ML ~~LOC~~ SOPN
15.0000 [IU] | PEN_INJECTOR | Freq: Every day | SUBCUTANEOUS | 2 refills | Status: DC
  Filled 2021-10-11: qty 3, 20d supply, fill #0
  Filled 2021-11-06: qty 3, 20d supply, fill #1

## 2021-10-11 ENCOUNTER — Other Ambulatory Visit (HOSPITAL_COMMUNITY): Payer: Self-pay

## 2021-10-11 ENCOUNTER — Ambulatory Visit: Payer: 59 | Admitting: Podiatry

## 2021-10-11 DIAGNOSIS — Q666 Other congenital valgus deformities of feet: Secondary | ICD-10-CM | POA: Diagnosis not present

## 2021-10-11 DIAGNOSIS — M722 Plantar fascial fibromatosis: Secondary | ICD-10-CM | POA: Diagnosis not present

## 2021-10-11 NOTE — Progress Notes (Signed)
Subjective:  Patient ID: Jennifer Cervantes, female    DOB: [birth year],  MRN: [id number]  Chief Complaint  Patient presents with   Plantar Fasciitis    PF FOLLOW UP, PATIENT DOING WELL, NO NUMBNESS OR TINGLING. SLIGHT PAIN    41 y.o. female presents with the above complaint.  Presents for follow-up right Planter fasciitis.  She states she is doing a lot better.  No pain.  Cam boot immobilization helped.  She denies any other acute complaints.   Review of Systems: Negative except as noted in the HPI. Denies N/V/F/Ch.  Past Medical History:  Diagnosis Date   Anemia    CAD (coronary artery disease)    a. LHC on 06/21/15 which showed 75% occl mid-dist LCx, 40% occl mRCA, 99% mid-dist LAD s/p DES and 50% occl mLAD s/p DES (overlapping stents) and significant LV dysfunction with anteroapical HK; b. 06/2015 Myoview: EF 55-65%, no ischemia/infarct.   CHF (congestive heart failure) (HCC)    Complication of anesthesia    adverse reaction to anesthesia-wakes up very confused, agitated, getting out of bed with eyes closed   GERD (gastroesophageal reflux disease)    Hyperlipidemia    Hypertension    Hypothyroidism    Myocardial stunning    a. EF 45% on 2D ECHO on 06/21/15 and severe LV dysfunction on LV gram by cath. Repeat 2D ECHO with EF 55-60% and mild HK of the apicoseptal myocardium.   Neuropraxia of right median nerve    a. suspected after right radial cath. will follow with neuro outpatient if does not resolve.    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 05/2015 Following PCI (radial access)-->conservatively managed.   Tobacco abuse    Tricuspid regurgitation    Type II diabetes mellitus ([place])    a. Type II   Vitamin D deficiency     Current Outpatient Medications:    acetaminophen (TYLENOL) 500 MG tablet, Take 500 mg by mouth every 6 (six) hours as needed for headache (pain)., Disp: , Rfl:     aspirin EC (ASPIRIN LOW DOSE) 81 MG tablet, Take 1 tablet (81 mg total) by mouth daily., Disp: 90 tablet, Rfl: 3   Cholecalciferol (VITAMIN D) 50 MCG (2000 UT) CAPS, Take 1 capsule (2,000 Units total) by mouth daily., Disp: 90 capsule, Rfl: 3   Continuous Blood Gluc Sensor (DEXCOM G6 SENSOR) MISC, Apply 1 sensor every 10 days., Disp: 4 each, Rfl: 11   Continuous Blood Gluc Transmit (DEXCOM G6 TRANSMITTER) MISC, use 1 transmitter every 90 days, Disp: 4 each, Rfl: 11   Empagliflozin-metFORMIN HCl ER (SYNJARDY XR) 06-998 MG TB24, Take 2 tablets by mouth daily., Disp: 90 tablet, Rfl: 0   HYDROcodone-acetaminophen (NORCO/VICODIN) 5-325 MG tablet, Take 1 tablet by mouth every 6 (six) hours as needed for moderate pain or severe pain., Disp: 10 tablet, Rfl: 0   Insulin Glargine-Lixisenatide (SOLIQUA) 100-33 UNT-MCG/ML SOPN, Inject 15 Units into the skin daily., Disp: 3 mL, Rfl: 2   Insulin Pen Needle 32G X 4 MM MISC, Use for insulin injection, Disp: 100 each, Rfl: 1   Insulin Pen Needle 32G X 4 MM MISC, Use with Soliqua daily, Disp: 100 each, Rfl: 0   losartan (COZAAR) 50 MG tablet, Take 1 tablet (50 mg total) by mouth daily., Disp: 90 tablet, Rfl: 0   metoprolol tartrate (LOPRESSOR) 25 MG tablet, Take 1 tablet (25 mg total)  by mouth 2 (two) times daily., Disp: 180 tablet, Rfl: 3   nitroGLYCERIN (NITROSTAT) 0.4 MG SL tablet, Place 1 tablet (0.4 mg total) under the tongue every 5 (five) minutes as needed for chest pain., Disp: 25 tablet, Rfl: 6   rosuvastatin (CRESTOR) 40 MG tablet, Take 1 tablet (40 mg total) by mouth daily., Disp: 90 tablet, Rfl: 3  Social History   Tobacco Use  Smoking Status Former   Packs/day: 0.50   Years: 23.00   Total pack years: 11.50   Types: Cigarettes   Quit date: 12/19/2020   Years since quitting: 0.8  Smokeless Tobacco Never    No Known Allergies Objective:  There were no vitals filed for this visit. There is no height or weight on file to calculate  BMI. Constitutional Well developed. Well nourished.  Vascular Dorsalis pedis pulses palpable bilaterally. Posterior tibial pulses palpable bilaterally. Capillary refill normal to all digits.  No cyanosis or clubbing noted. Pedal hair growth normal.  Neurologic Normal speech. Oriented to person, place, and time. Epicritic sensation to light touch grossly present bilaterally.  Dermatologic Nails well groomed and normal in appearance. No open wounds. No skin lesions.  Orthopedic: Normal joint ROM without pain or crepitus bilaterally. No visible deformities. No further patient tender to palpation at the calcaneal tuber right. No pain with calcaneal squeeze right. Ankle ROM diminished range of motion right. Silfverskiold Test: positive right.   Radiographs: Taken and reviewed. No acute fractures or dislocations. No evidence of stress fracture.  Plantar heel spur present. Posterior heel spur present.  Pes planovalgus foot structure noted Assessment:   No diagnosis found.   Plan:  Patient was evaluated and treated and all questions answered.  Left 1 through 5 onychomycosis -We will hold off on Lamisil as a last liver function test was elevated.  Pes planovalgus -I explained to patient the etiology of pes planovalgus and relationship with Planter fasciitis and various treatment options were discussed.  Given patient foot structure in the setting of Planter fasciitis I believe patient will benefit from custom-made orthotics to help control the hindfoot motion support the arch of the foot and take the stress away from plantar fascial.  Patient agrees with the plan like to proceed with orthotics -Fourth toe dispensed.  Functioning well.    Plantar Fasciitis, right -Clinically healed with cam boot immobilization.  At this time no further interventions are warranted.  I discussed with her offloading shoe gear modification orthotics management orthotics were dispensed.  If any foot and  ankle issues in the future advised her to come in to see me.  She states understanding.   No follow-ups on file.

## 2021-10-16 ENCOUNTER — Other Ambulatory Visit (HOSPITAL_COMMUNITY): Payer: Self-pay

## 2021-10-30 ENCOUNTER — Encounter: Payer: Self-pay | Admitting: Internal Medicine

## 2021-11-06 ENCOUNTER — Other Ambulatory Visit (HOSPITAL_COMMUNITY): Payer: Self-pay

## 2021-11-21 ENCOUNTER — Other Ambulatory Visit (HOSPITAL_COMMUNITY): Payer: Self-pay

## 2021-11-21 ENCOUNTER — Ambulatory Visit (INDEPENDENT_AMBULATORY_CARE_PROVIDER_SITE_OTHER): Payer: 59 | Admitting: Medical

## 2021-11-21 VITALS — BP 120/80 | HR 44 | Ht 69.0 in | Wt 225.2 lb

## 2021-11-21 DIAGNOSIS — R0683 Snoring: Secondary | ICD-10-CM | POA: Diagnosis not present

## 2021-11-21 DIAGNOSIS — I10 Essential (primary) hypertension: Secondary | ICD-10-CM | POA: Diagnosis not present

## 2021-11-21 DIAGNOSIS — Z0000 Encounter for general adult medical examination without abnormal findings: Secondary | ICD-10-CM

## 2021-11-21 DIAGNOSIS — I8390 Asymptomatic varicose veins of unspecified lower extremity: Secondary | ICD-10-CM

## 2021-11-21 DIAGNOSIS — E118 Type 2 diabetes mellitus with unspecified complications: Secondary | ICD-10-CM | POA: Diagnosis not present

## 2021-11-21 DIAGNOSIS — I781 Nevus, non-neoplastic: Secondary | ICD-10-CM

## 2021-11-21 DIAGNOSIS — K219 Gastro-esophageal reflux disease without esophagitis: Secondary | ICD-10-CM

## 2021-11-21 DIAGNOSIS — I252 Old myocardial infarction: Secondary | ICD-10-CM

## 2021-11-21 DIAGNOSIS — E7849 Other hyperlipidemia: Secondary | ICD-10-CM

## 2021-11-21 DIAGNOSIS — E559 Vitamin D deficiency, unspecified: Secondary | ICD-10-CM | POA: Diagnosis not present

## 2021-11-21 DIAGNOSIS — Z7185 Encounter for immunization safety counseling: Secondary | ICD-10-CM

## 2021-11-21 DIAGNOSIS — R0989 Other specified symptoms and signs involving the circulatory and respiratory systems: Secondary | ICD-10-CM

## 2021-11-21 NOTE — Progress Notes (Signed)
Subjective:   HPI  Jennifer Cervantes is a 41 y.o. female who presents for Chief Complaint  Patient presents with   FASTING CPE    Fasitng cpe, no concerns. Obgyn-, will get flu shot at work    Patient Care Team: Thoms Barthelemy, Leward Quan as PCP - General (Family Medicine) Josue Hector, MD as PCP - Cardiology (Cardiology) Sees dentist Sees eye doctor Dr. Nat Math, gynecology [facility] Dr. Marcial Pacas and Noberto Retort, NP, neurology Dr. Christinia Gully, pulmonology Dr. Boneta Lucks, podiatry   Concerns: [facility] cardiology a few months ago.  Xarelto was discontinued  Diabetes-using Dexcom.  Has had a few low readings of late.  Using 15 u Solqiua currently.  Has questions about varicose and spider veins  Compliant with statin.  Reviewed their medical, surgical, family, social, medication, and allergy history and updated chart as appropriate.  Past Medical History:  Diagnosis Date   Anemia    CAD (coronary artery disease)    a. LHC on 06/21/15 which showed 75% occl mid-dist LCx, 40% occl mRCA, 99% mid-dist LAD s/p DES and 50% occl mLAD s/p DES (overlapping stents) and significant LV dysfunction with anteroapical HK; b. 06/2015 Myoview: EF 55-65%, no ischemia/infarct.   CHF (congestive heart failure) (HCC)    Complication of anesthesia    adverse reaction to anesthesia-wakes up very confused, agitated, getting out of bed with eyes closed   GERD (gastroesophageal reflux disease)    Hyperlipidemia    Hypertension    Hypothyroidism    Myocardial stunning    a. EF 45% on 2D ECHO on 06/21/15 and severe LV dysfunction on LV gram by cath. Repeat 2D ECHO with EF 55-60% and mild HK of the apicoseptal myocardium.   Neuropraxia of right median nerve    a. suspected after right radial cath. will follow with neuro outpatient if does not resolve.    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 05/2015 Following PCI (radial access)-->conservatively managed.   Tobacco abuse    Tricuspid regurgitation    Type II diabetes mellitus ([place])    a. Type II   Vitamin D deficiency     Past Surgical History:  Procedure Laterality Date   48 HOUR [place] STUDY N/A 07/14/2016   Procedure: 24 HOUR PH STUDY;  Surgeon: Mauri Pole, MD;  Location: [facility];  Service: Endoscopy;  Laterality: N/A;   BREAST LUMPECTOMY WITH RADIOACTIVE SEED LOCALIZATION Right 01/09/2021   Procedure: RIGHT BREAST LUMPECTOMY WITH RADIOACTIVE SEED LOCALIZATION;  Surgeon: Jovita Kussmaul, MD;  Location: [place];  Service: General;  Laterality: Right;   CARDIAC CATHETERIZATION N/A 06/21/2015   Procedure: Left Heart Cath and Coronary Angiography;  Surgeon: Belva Crome, MD; pLAD 99%, CFX 75%, RCA 40%, EF 35%    CARDIAC CATHETERIZATION N/A 06/21/2015   Procedure: Coronary Stent Intervention;  Surgeon: Belva Crome, MD;  Promus Premier DES, 3.0 x 20 and 3.5 x 32 mm stents to the LAD   CARDIAC CATHETERIZATION N/A 06/21/2015   Procedure: Intravascular Ultrasound/IVUS;  Surgeon: Belva Crome, MD;  Location: [place] [facility];  Service: Cardiovascular;  Laterality: N/A;  LAD   CARDIAC CATHETERIZATION  06/22/2015   Procedure: Coronary/Graft Angiography;  Surgeon: Peter M Martinique, MD;  Location: [place] [facility];  Service: Cardiovascular;;   CORONARY STENT INTERVENTION N/A 04/02/2016   Procedure: Coronary  Stent Intervention;  Surgeon: Jettie Booze, MD;  Location: [place] [facility];  Service: Cardiovascular;  Laterality: N/A;   ESOPHAGEAL MANOMETRY N/A 07/14/2016   Procedure: ESOPHAGEAL MANOMETRY (EM);  Surgeon: Mauri Pole, MD;  Location: [facility];  Service: Endoscopy;  Laterality: N/A;   LEFT HEART CATH AND CORONARY ANGIOGRAPHY N/A 04/02/2016   Procedure: Left Heart Cath and Coronary Angiography;  Surgeon: Jettie Booze, MD;  Location: [place] [facility];  Service: Cardiovascular;   Laterality: N/A;   LEFT HEART CATH AND CORONARY ANGIOGRAPHY N/A 07/17/2016   Procedure: Left Heart Cath and Coronary Angiography;  Surgeon: Leonie Man, MD;  Location: [place] [facility];  Service: Cardiovascular;  Laterality: N/A;    Social History   Socioeconomic History   Marital status: Single    Spouse name: Not on file   Number of children: 4   Years of education: 12   Highest education level: Not on file  Occupational History   Occupation: CNA    Employer: [employer]  Tobacco Use   Smoking status: Former    Packs/day: 0.50    Years: 23.00    Total pack years: 11.50    Types: Cigarettes    Quit date: 12/19/2020    Years since quitting: 0.9   Smokeless tobacco: Never  Vaping Use   Vaping Use: Never used  Substance and Sexual Activity   Alcohol use: No    Alcohol/week: 0.0 standard drinks of alcohol   Drug use: No   Sexual activity: Not on file  Other Topics Concern   Not on file  Social History Narrative   Lives in [place], works nights at [employer], [place]   Right-handed   Drinks 1 soda a day   Exercises some with walking   Lives with her son   Laverle Hobby her space, doesn't want a significant other   05/2019   Social Determinants of Health   Financial Resource Strain: Not on file  Food Insecurity: Not on file  Transportation Needs: Not on file  Physical Activity: Not on file  Stress: Not on file  Social Connections: Not on file  Intimate Partner Violence: Not on file    Family History  Problem Relation Age of Onset   Diabetes Mother    Healthy Father    Healthy Sister    Healthy Brother    Diabetes Maternal Grandmother    Heart disease Maternal Grandmother    Diabetes Maternal Aunt        x 2   Heart attack Neg Hx    Heart failure Neg Hx    Stroke Neg Hx    Hyperlipidemia Neg Hx    Hypertension Neg Hx    Stomach cancer Neg Hx    Colon cancer Neg Hx      Current Outpatient Medications:    acetaminophen (TYLENOL) 500 MG tablet, Take 500 mg by  mouth every 6 (six) hours as needed for headache (pain)., Disp: , Rfl:    aspirin EC (ASPIRIN LOW DOSE) 81 MG tablet, Take 1 tablet (81 mg total) by mouth daily., Disp: 90 tablet, Rfl: 3   Cholecalciferol (VITAMIN D) 50 MCG (2000 UT) CAPS, Take 1 capsule (2,000 Units total) by mouth daily., Disp: 90 capsule, Rfl: 3   Continuous Blood Gluc Sensor (DEXCOM G6 SENSOR) MISC, Apply 1 sensor every 10 days., Disp: 4 each, Rfl: 11   Continuous Blood Gluc Transmit (DEXCOM G6 TRANSMITTER) MISC, use 1 transmitter every 90 days, Disp: 4  each, Rfl: 11   Empagliflozin-metFORMIN HCl ER (SYNJARDY XR) 06-998 MG TB24, Take 2 tablets by mouth daily. (Patient taking differently: Take 1 tablet by mouth daily.), Disp: 90 tablet, Rfl: 0   HYDROcodone-acetaminophen (NORCO/VICODIN) 5-325 MG tablet, Take 1 tablet by mouth every 6 (six) hours as needed for moderate pain or severe pain., Disp: 10 tablet, Rfl: 0   Insulin Glargine-Lixisenatide (SOLIQUA) 100-33 UNT-MCG/ML SOPN, Inject 15 Units into the skin daily., Disp: 3 mL, Rfl: 2   Insulin Pen Needle 32G X 4 MM MISC, Use for insulin injection, Disp: 100 each, Rfl: 1   Insulin Pen Needle 32G X 4 MM MISC, Use with Soliqua daily, Disp: 100 each, Rfl: 0   losartan (COZAAR) 50 MG tablet, Take 1 tablet (50 mg total) by mouth daily., Disp: 90 tablet, Rfl: 0   metoprolol tartrate (LOPRESSOR) 25 MG tablet, Take 1 tablet (25 mg total) by mouth 2 (two) times daily., Disp: 180 tablet, Rfl: 3   nitroGLYCERIN (NITROSTAT) 0.4 MG SL tablet, Place 1 tablet (0.4 mg total) under the tongue every 5 (five) minutes as needed for chest pain., Disp: 25 tablet, Rfl: 6   rosuvastatin (CRESTOR) 40 MG tablet, Take 1 tablet (40 mg total) by mouth daily., Disp: 90 tablet, Rfl: 3  No Known Allergies   Review of Systems Constitutional: -fever, -chills, -sweats, -unexpected weight change, -decreased appetite, -fatigue Allergy: -sneezing, -itching, -congestion Dermatology: -changing moles, --rash,  -lumps ENT: -runny nose, -ear pain, -sore throat, -hoarseness, -sinus pain, -teeth pain, - ringing in ears, -hearing loss, -nosebleeds Cardiology: -chest pain, -palpitations, -swelling, -difficulty breathing when lying flat, -waking up short of breath Respiratory: -cough, -shortness of breath, -difficulty breathing with exercise or exertion, -wheezing, -coughing up blood Gastroenterology: -abdominal pain, -nausea, -vomiting, -diarrhea, -constipation, -blood in stool, -changes in bowel movement, -difficulty swallowing or eating Hematology: -bleeding, -bruising  Musculoskeletal: -joint aches, -muscle aches, -joint swelling, -back pain, -neck pain, -cramping, -changes in gait Ophthalmology: denies vision changes, eye redness, itching, discharge Urology: -burning with urination, -difficulty urinating, -blood in urine, -urinary frequency, -urgency, -incontinence Neurology: -headache, -weakness, -tingling, -numbness, -memory loss, -falls, -dizziness Psychology: -depressed mood, -agitation, -sleep problems Breast/gyn: -breast tendnerss, -discharge, -lumps, -vaginal discharge,- irregular periods, -heavy periods     Objective:  BP 120/80   Pulse (!) 44   Ht '5\' 9"'$  (1.753 m)   Wt 225 lb 3.2 oz (102.2 kg)   BMI 33.26 kg/m   General appearance: alert, no distress, WD/WN, African American female Skin: no worrisome lesions HEENT and oral unremarkable, teeth in good repair, no lesions, no small airway or tonsillar hypertrophy Neck: supple, no lymphadenopathy, no thyromegaly, no masses, normal ROM, no bruits Chest: non tender, normal shape and expansion Heart: RRR, normal S1, S2, no murmurs Lungs: CTA bilaterally, no wheezes, rhonchi, or rales Abdomen: +bs, soft, non tender, non distended, no masses, no hepatomegaly, no splenomegaly, no bruits Back: non tender, normal ROM, no scoliosis Musculoskeletal:  upper extremities non tender, no obvious deformity, normal ROM throughout, lower extremities non  tender, no obvious deformity, normal ROM throughout Extremities: varicose veins right lateral leg, scattered lower leg spider veins, no edema, no cyanosis, no clubbing Pulses: Unable to palpate good right radial pulse, otherwise 2+ symmetric, upper and lower extremities, normal cap refill Neurological: alert, oriented x 3, CN2-12 intact, strength normal upper extremities and lower extremities, sensation normal throughout, DTRs 2+ throughout, no cerebellar signs, gait normal Psychiatric: normal affect, behavior normal, pleasant  Breast/gyn/rectal - deferred to gynecology    Diabetic  Foot Exam - Simple   Simple Foot Form Visual Inspection No deformities, no ulcerations, no other skin breakdown bilaterally: Yes Sensation Testing Intact to touch and monofilament testing bilaterally: Yes Pulse Check See comments: Yes Comments 1+ pedal pulses      Assessment and Plan :   Encounter Diagnoses  Name Primary?   Encounter for health maintenance examination in adult Yes   Spider veins    Varicose veins of lower extremity, unspecified laterality, unspecified whether complicated    Type II diabetes mellitus with complication (HCC)    Vitamin D deficiency    Vaccine counseling    Snoring    Decreased pulses in feet    Essential hypertension    Gastroesophageal reflux disease, unspecified whether esophagitis present    History of non-ST elevation myocardial infarction (NSTEMI)    Other hyperlipidemia     Physical exam - discussed and counseled on healthy lifestyle, diet, exercise, preventative care, vaccinations, sick and well care, proper use of emergency dept and after hours care, and addressed their concerns.    Health screening: Advised they see their eye doctor yearly for routine vision care. Advised they see their dentist yearly for routine dental care including hygiene visits twice yearly. See your gynecologist yearly for routine gynecological care.   Vaccines: She will get  flu shot free at work soon   Cancer screening: Sees gyn for Mammo, pap  Due for colonoscopy age 1  Discussed skin surveillance.  Discussed routine labs   Separate significant chronic issues discussed: Snoring but no other symptoms - declines sleep study  Diabetes Exercise regularly Check feet daily for sores or wounds and let us know if something is not healing appropriately Continue Dexcom glucose monitoring.  Goal was less than 130 sugars before meals, less than 200 postprandial 2 hours after a meal Continue current medications but cut down to 12-13 [place] given some recent low readings See me every 4 months for diabetes, not just once yearly  Hyperlipidemia-continue cholesterol medication  High blood pressure-continue blood pressure medication  History of heart disease-i reviewed 08/2021 cardiology notes.  Xarelto recently D/c  Vitamin D deficiency-continue vitamin D supplement  Spider veins, varicose veins - advised vascular consult. She declines, wants to just continue with exercise and ted hose OTC  Decreased pulses in feet-ABI reviewed from 07/2021  Oluwatoyin was seen today for fasting cpe.  Diagnoses and all orders for this visit:  Encounter for health maintenance examination in adult -     Comprehensive metabolic panel -     Hemoglobin A1c  Spider veins  Varicose veins of lower extremity, unspecified laterality, unspecified whether complicated  Type II diabetes mellitus with complication (HCC) -     Cancel: Hemoglobin A1c -     Cancel: Comprehensive metabolic panel -     Comprehensive metabolic panel -     Hemoglobin A1c  Vitamin D deficiency  Vaccine counseling  Snoring  Decreased pulses in feet  Essential hypertension  Gastroesophageal reflux disease, unspecified whether esophagitis present  History of non-ST elevation myocardial infarction (NSTEMI)  Other hyperlipidemia   Follow-up pending labs, every 4 to 6 months for diabetes, yearly for  physical

## 2021-11-22 LAB — COMPREHENSIVE METABOLIC PANEL
ALT: 17 IU/L (ref 0–32)
AST: 22 IU/L (ref 0–40)
Albumin/Globulin Ratio: 1.8 (ref 1.2–2.2)
Albumin: 4.8 g/dL (ref 3.9–4.9)
Alkaline Phosphatase: 96 IU/L (ref 44–121)
BUN/Creatinine Ratio: 11 (ref 9–23)
BUN: 11 mg/dL (ref 6–24)
Bilirubin Total: 0.3 mg/dL (ref 0.0–1.2)
CO2: 13 mmol/L — ABNORMAL LOW (ref 20–29)
Calcium: 9.7 mg/dL (ref 8.7–10.2)
Chloride: 111 mmol/L — ABNORMAL HIGH (ref 96–106)
Creatinine, Ser: 1.03 mg/dL — ABNORMAL HIGH (ref 0.57–1.00)
Globulin, Total: 2.7 g/dL (ref 1.5–4.5)
Glucose: 93 mg/dL (ref 70–99)
Potassium: 5.2 mmol/L (ref 3.5–5.2)
Sodium: 142 mmol/L (ref 134–144)
Total Protein: 7.5 g/dL (ref 6.0–8.5)
eGFR: 69 mL/min/{1.73_m2} (ref >59)

## 2021-11-22 LAB — HEMOGLOBIN A1C
Est. average glucose Bld gHb Est-mCnc: 157 mg/dL
Hgb A1c MFr Bld: 7.1 % — ABNORMAL HIGH (ref 4.8–5.6)

## 2021-11-25 ENCOUNTER — Other Ambulatory Visit: Payer: Self-pay

## 2021-11-25 ENCOUNTER — Other Ambulatory Visit (HOSPITAL_COMMUNITY): Payer: Self-pay

## 2021-11-25 ENCOUNTER — Other Ambulatory Visit: Payer: Self-pay | Admitting: Medical

## 2021-11-25 MED ORDER — SYNJARDY XR 5-1000 MG PO TB24
1.0000 | ORAL_TABLET | Freq: Every day | ORAL | 1 refills | Status: DC
  Filled 2021-11-25: qty 90, 90d supply, fill #0

## 2021-11-25 MED ORDER — MOUNJARO 5 MG/0.5ML ~~LOC~~ SOAJ
5.0000 mg | SUBCUTANEOUS | 1 refills | Status: DC
  Filled 2021-11-25: qty 2, 28d supply, fill #0
  Filled 2021-12-18: qty 2, 28d supply, fill #1

## 2021-11-25 MED ORDER — LOSARTAN POTASSIUM 50 MG PO TABS
50.0000 mg | ORAL_TABLET | Freq: Every day | ORAL | 3 refills | Status: DC
  Filled 2021-11-25: qty 90, 90d supply, fill #0
  Filled 2022-03-11: qty 90, 90d supply, fill #1
  Filled 2022-07-02 – 2022-07-04 (×2): qty 90, 90d supply, fill #2

## 2021-12-03 ENCOUNTER — Encounter: Payer: Self-pay | Admitting: Internal Medicine

## 2021-12-03 ENCOUNTER — Telehealth: Payer: Self-pay

## 2021-12-03 NOTE — Telephone Encounter (Signed)
Pt. Called stating she will not be taking the Synjardy XR you recently changed her to. She said she has three of the problems heart failure, liver function, and bowel issues, so she wanted you to switch her back to her regular synjardy.

## 2021-12-04 ENCOUNTER — Other Ambulatory Visit: Payer: Self-pay | Admitting: Medical

## 2021-12-04 ENCOUNTER — Other Ambulatory Visit (HOSPITAL_COMMUNITY): Payer: Self-pay

## 2021-12-04 MED ORDER — SYNJARDY 5-1000 MG PO TABS
1.0000 | ORAL_TABLET | Freq: Two times a day (BID) | ORAL | 3 refills | Status: DC
  Filled 2021-12-04 – 2022-10-08 (×2): qty 180, 90d supply, fill #0

## 2021-12-18 ENCOUNTER — Other Ambulatory Visit (HOSPITAL_COMMUNITY): Payer: Self-pay

## 2022-01-17 ENCOUNTER — Other Ambulatory Visit: Payer: Self-pay | Admitting: Medical

## 2022-01-20 ENCOUNTER — Other Ambulatory Visit (HOSPITAL_COMMUNITY): Payer: Self-pay

## 2022-01-20 MED ORDER — MOUNJARO 5 MG/0.5ML ~~LOC~~ SOAJ
5.0000 mg | SUBCUTANEOUS | 1 refills | Status: DC
  Filled 2022-01-20: qty 2, 28d supply, fill #0
  Filled 2022-03-06: qty 2, 28d supply, fill #1

## 2022-02-03 ENCOUNTER — Other Ambulatory Visit: Payer: Self-pay

## 2022-02-03 ENCOUNTER — Other Ambulatory Visit (HOSPITAL_COMMUNITY): Payer: Self-pay

## 2022-03-07 ENCOUNTER — Other Ambulatory Visit: Payer: Self-pay

## 2022-03-07 ENCOUNTER — Other Ambulatory Visit (HOSPITAL_COMMUNITY): Payer: Self-pay

## 2022-03-12 ENCOUNTER — Other Ambulatory Visit (HOSPITAL_COMMUNITY): Payer: Self-pay

## 2022-03-12 ENCOUNTER — Other Ambulatory Visit: Payer: Self-pay

## 2022-03-14 ENCOUNTER — Other Ambulatory Visit (HOSPITAL_COMMUNITY): Payer: Self-pay

## 2022-03-18 ENCOUNTER — Other Ambulatory Visit (HOSPITAL_COMMUNITY): Payer: Self-pay

## 2022-04-03 ENCOUNTER — Other Ambulatory Visit (HOSPITAL_COMMUNITY): Payer: Self-pay

## 2022-04-03 ENCOUNTER — Other Ambulatory Visit: Payer: Self-pay | Admitting: Medical

## 2022-04-03 ENCOUNTER — Other Ambulatory Visit: Payer: Self-pay

## 2022-04-03 MED ORDER — MOUNJARO 5 MG/0.5ML ~~LOC~~ SOAJ
5.0000 mg | SUBCUTANEOUS | 0 refills | Status: DC
  Filled 2022-04-03: qty 2, 28d supply, fill #0

## 2022-04-08 ENCOUNTER — Other Ambulatory Visit (HOSPITAL_COMMUNITY)
Admission: RE | Admit: 2022-04-08 | Discharge: 2022-04-08 | Disposition: A | Discharge status: Home / Self Care | Payer: Commercial Managed Care - PPO | Source: Ambulatory Visit | Attending: Medical | Admitting: Medical

## 2022-04-08 ENCOUNTER — Other Ambulatory Visit (HOSPITAL_COMMUNITY): Payer: Self-pay

## 2022-04-08 ENCOUNTER — Ambulatory Visit: Payer: Commercial Managed Care - PPO | Admitting: Medical

## 2022-04-08 VITALS — BP 110/70 | HR 90 | Wt 218.2 lb

## 2022-04-08 DIAGNOSIS — L989 Disorder of the skin and subcutaneous tissue, unspecified: Secondary | ICD-10-CM | POA: Diagnosis not present

## 2022-04-08 DIAGNOSIS — E118 Type 2 diabetes mellitus with unspecified complications: Secondary | ICD-10-CM | POA: Diagnosis not present

## 2022-04-08 DIAGNOSIS — I10 Essential (primary) hypertension: Secondary | ICD-10-CM | POA: Diagnosis not present

## 2022-04-08 DIAGNOSIS — E7849 Other hyperlipidemia: Secondary | ICD-10-CM | POA: Diagnosis not present

## 2022-04-08 DIAGNOSIS — E559 Vitamin D deficiency, unspecified: Secondary | ICD-10-CM | POA: Diagnosis not present

## 2022-04-08 MED ORDER — ROSUVASTATIN CALCIUM 40 MG PO TABS
40.0000 mg | ORAL_TABLET | Freq: Every day | ORAL | 3 refills | Status: DC
  Filled 2022-04-08 – 2023-01-26 (×2): qty 90, 90d supply, fill #0

## 2022-04-08 MED ORDER — MOUNJARO 5 MG/0.5ML ~~LOC~~ SOAJ
5.0000 mg | SUBCUTANEOUS | 5 refills | Status: DC
  Filled 2022-04-08: qty 2, 28d supply, fill #0
  Filled 2022-07-02: qty 2, 28d supply, fill #1

## 2022-04-08 NOTE — Progress Notes (Signed)
Subjective: Chief Complaint  Patient presents with   med check    Med check- needs refills on meds and labs checked and wants a mole removed on arm   Here for medcheck and skin lesion  Patient Care Team: Mort Smelser, Camelia Eng, PA-C as PCP - General (Family Medicine) Herminio Commons, MD (Inactive) as PCP - Cardiology (Cardiology) Sees dentist Sees eye doctor Dr. Nat Math, gynecology [facility] Dr. Marcial Pacas and Noberto Retort, NP, neurology Dr. Christinia Gully, pulmonology  Diabetes-using Dexcom continuous glucose monitor.  No recent issues.  She does not have her reader, but is on the portal.  She denies any recent hypoglycemic episodes.  She does say her numbers are up-and-down though.  She is compliant with Synjardy 06/998 mg twice daily twice daily and Mounjaro 5 mg weekly.  Seems to be doing fine on this without much side effect  Hypertension-compliant with losartan 50 mg daily and metoprolol 25 mg twice daily.  Home blood pressures tend to be normal to little lower than what we got today.  She has seen some diastolics in the 123456 and 0000000 range.  Systolic tends to be [id number].  Hyperlipidemia-compliant with Crestor 40 mg daily, aspirin 81 mg daily  Vitamin D deficiency-compliant with vitamin D 2000 units daily  Doing fine overall  She has a skin lesion on her left posterior elbow that popped up about a month ago.  Would like this removed.  Of note, no history of dental prophylactic antibiotics, no history of artificial valve or valve surgery, no orthopedic surgery in the past year.  No recent problems of bleeding or bruising   Past Medical History:  Diagnosis Date   Anemia    CAD (coronary artery disease)    a. LHC on 06/21/15 which showed 75% occl mid-dist LCx, 40% occl mRCA, 99% mid-dist LAD s/p DES and 50% occl mLAD s/p DES (overlapping stents) and significant LV dysfunction with anteroapical HK; b. 06/2015 Myoview: EF 55-65%, no ischemia/infarct.   CHF  (congestive heart failure) (HCC)    Complication of anesthesia    adverse reaction to anesthesia-wakes up very confused, agitated, getting out of bed with eyes closed   GERD (gastroesophageal reflux disease)    Hyperlipidemia    Hypertension    Hypothyroidism    Myocardial stunning    a. EF 45% on 2D ECHO on 06/21/15 and severe LV dysfunction on LV gram by cath. Repeat 2D ECHO with EF 55-60% and mild HK of the apicoseptal myocardium.   Neuropraxia of right median nerve    a. suspected after right radial cath. will follow with neuro outpatient if does not resolve.    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 05/2015 Following PCI (radial access)-->conservatively managed.   Tobacco abuse    Tricuspid regurgitation    Type II diabetes mellitus ([place])    a. Type II   Vitamin D deficiency    Current Outpatient Medications on File Prior to Visit  Medication Sig Dispense Refill   acetaminophen (TYLENOL) 500 MG tablet Take 500 mg by mouth every 6 (six) hours as needed for headache (pain).     aspirin EC (ASPIRIN LOW DOSE) 81 MG tablet Take 1 tablet (81 mg total) by mouth daily. 90 tablet 3   Cholecalciferol (VITAMIN D) 50 MCG (2000 UT) CAPS Take 1 capsule (2,000 Units total) by mouth daily. 90 capsule  3   Continuous Blood Gluc Sensor (DEXCOM G6 SENSOR) MISC Apply 1 sensor every 10 days. 4 each 11   Continuous Blood Gluc Transmit (DEXCOM G6 TRANSMITTER) MISC use 1 transmitter every 90 days 4 each 11   Empagliflozin-metFORMIN HCl (SYNJARDY) 06-998 MG TABS Take 1 tablet by mouth 2 (two) times daily. 180 tablet 3   HYDROcodone-acetaminophen (NORCO/VICODIN) 5-325 MG tablet Take 1 tablet by mouth every 6 (six) hours as needed for moderate pain or severe pain. 10 tablet 0   losartan (COZAAR) 50 MG tablet Take 1 tablet (50 mg total) by mouth daily. 90 tablet 3   metoprolol tartrate (LOPRESSOR) 25 MG tablet Take 1  tablet (25 mg total) by mouth 2 (two) times daily. 180 tablet 3   nitroGLYCERIN (NITROSTAT) 0.4 MG SL tablet Place 1 tablet (0.4 mg total) under the tongue every 5 (five) minutes as needed for chest pain. 25 tablet 6   No current facility-administered medications on file prior to visit.   Review of systems as in subjective   Objective: BP 110/70   Pulse 90   Wt 218 lb 3.2 oz (99 kg)   BMI 32.22 kg/m   General appearence: alert, no distress, WD/WN,  Neck: supple, no lymphadenopathy, no thyromegaly, no masses Heart: RRR, normal S1, S2, no murmurs Lungs: CTA bilaterally, no wheezes, rhonchi, or rales Pulses: 2+ symmetric, upper and lower extremities, normal cap refill Left elbow posteriorly just proximal to the olecranon with a 5 mm raised verrucal appearing lesion.  Slightly tender around the lesion but no fluctuance, no induration, no drainage or discharge     Assessment: Encounter Diagnoses  Name Primary?   Type II diabetes mellitus with complication (HCC) Yes   Vitamin D deficiency    Skin lesion of left arm    Essential hypertension    Other hyperlipidemia       Plan: Diabetes-updated labs today, continue current medication  Hyperlipidemia-continue statin and aspirin.  I reviewed her labs from the past 12 months, last lipid panel June 2023  Hypertension-continue current medication but monitor blood pressures.  If seeing numbers less than 110/60 regularly we may need to adjust her medication  Vitamin D deficiency-updated labs today, continue supplement 2000 units daily for now   Separate specific issue: Skin lesion left elbow region posteriorly: Discussed skin findings.   Patient desires excision.  Discussed risks and benefits of excisional biopsy.  Cleaned and prepped left posterior arm just proximal to elbow in usual sterile fashion, used 1% lidocaine with epinephrine for local anesthesia.   Used excisional biopsy to remove the lesion.  Used an 8 mm punch biopsy.   I placed two 5.0 nylon sutures using simple interrupted fashion to achieve hemostasis.  Cleaned the wound area. Covered with sterile bandage.   discussed wound care.  Pt tolerated procedure well.  <3 cc EBL.   Myani was seen today for med check.  Diagnoses and all orders for this visit:  Type II diabetes mellitus with complication ([place]) -     Hemoglobin A1c -     Basic metabolic panel  Vitamin D deficiency -     VITAMIN D 25 Hydroxy (Vit-D Deficiency, Fractures)  Skin lesion of left arm -     Surgical pathology  Essential hypertension  Other hyperlipidemia  Other orders -     tirzepatide (MOUNJARO) 5 MG/0.5ML Pen; Inject 5 mg into the skin once a week. -     rosuvastatin (CRESTOR) 40 MG tablet; Take 1 tablet (40  mg total) by mouth daily.    F/u pending labs

## 2022-04-08 NOTE — Progress Notes (Signed)
Requested a rep for portal

## 2022-04-09 ENCOUNTER — Other Ambulatory Visit (HOSPITAL_COMMUNITY): Payer: Self-pay

## 2022-04-09 ENCOUNTER — Other Ambulatory Visit: Payer: Self-pay | Admitting: Medical

## 2022-04-09 LAB — BASIC METABOLIC PANEL
BUN/Creatinine Ratio: 8 — ABNORMAL LOW (ref 9–23)
BUN: 8 mg/dL (ref 6–24)
CO2: 20 mmol/L (ref 20–29)
Calcium: 9.8 mg/dL (ref 8.7–10.2)
Chloride: 106 mmol/L (ref 96–106)
Creatinine, Ser: 0.96 mg/dL (ref 0.57–1.00)
Glucose: 88 mg/dL (ref 70–99)
Potassium: 4 mmol/L (ref 3.5–5.2)
Sodium: 141 mmol/L (ref 134–144)
eGFR: 75 mL/min/{1.73_m2} (ref >59)

## 2022-04-09 LAB — VITAMIN D 25 HYDROXY (VIT D DEFICIENCY, FRACTURES): Vit D, 25-Hydroxy: 15.3 ng/mL — ABNORMAL LOW (ref 30.0–100.0)

## 2022-04-09 LAB — HEMOGLOBIN A1C
Est. average glucose Bld gHb Est-mCnc: 123 mg/dL
Hgb A1c MFr Bld: 5.9 % — ABNORMAL HIGH (ref 4.8–5.6)

## 2022-04-09 MED ORDER — VITAMIN D (ERGOCALCIFEROL) 1.25 MG (50000 UNIT) PO CAPS
50000.0000 [IU] | ORAL_CAPSULE | ORAL | 0 refills | Status: DC
  Filled 2022-04-09: qty 12, 84d supply, fill #0

## 2022-04-09 NOTE — Progress Notes (Signed)
Results sent through MyChart

## 2022-04-10 LAB — SURGICAL PATHOLOGY

## 2022-04-11 NOTE — Progress Notes (Signed)
Results sent through MyChart

## 2022-04-15 ENCOUNTER — Telehealth: Payer: Self-pay | Admitting: Medical

## 2022-04-15 NOTE — Telephone Encounter (Signed)
Jennifer Cervantes called and wants to know if she needs to come in to have her stitches removed since it has been 7 days?

## 2022-04-15 NOTE — Telephone Encounter (Signed)
Please advise 

## 2022-04-25 ENCOUNTER — Other Ambulatory Visit (HOSPITAL_COMMUNITY): Payer: Self-pay

## 2022-04-28 ENCOUNTER — Other Ambulatory Visit (HOSPITAL_COMMUNITY): Payer: Self-pay

## 2022-07-02 ENCOUNTER — Other Ambulatory Visit (HOSPITAL_COMMUNITY): Payer: Self-pay

## 2022-07-02 ENCOUNTER — Telehealth: Payer: Self-pay | Admitting: Medical

## 2022-07-02 ENCOUNTER — Other Ambulatory Visit: Payer: Self-pay

## 2022-07-02 MED ORDER — MOUNJARO 5 MG/0.5ML ~~LOC~~ SOAJ
5.0000 mg | SUBCUTANEOUS | 5 refills | Status: DC
  Filled 2022-07-02: qty 2, 28d supply, fill #0
  Filled 2022-07-30: qty 2, 28d supply, fill #1
  Filled 2022-09-05: qty 2, 28d supply, fill #2
  Filled 2022-10-08: qty 2, 28d supply, fill #3

## 2022-07-02 NOTE — Telephone Encounter (Signed)
[facility] and [facility] had 1 box left. [facility] for pt.   Pt was notified.

## 2022-07-02 NOTE — Telephone Encounter (Signed)
Please call re Mounjaro rx, she wants to know if you can change to different meds Pharmacy does not have and does not know when they will have any

## 2022-07-03 ENCOUNTER — Other Ambulatory Visit (HOSPITAL_COMMUNITY): Payer: Self-pay

## 2022-07-03 ENCOUNTER — Other Ambulatory Visit: Payer: Self-pay

## 2022-07-03 ENCOUNTER — Encounter: Payer: Self-pay | Admitting: Pharmacist

## 2022-07-04 ENCOUNTER — Other Ambulatory Visit: Payer: Self-pay

## 2022-07-04 ENCOUNTER — Other Ambulatory Visit (HOSPITAL_COMMUNITY): Payer: Self-pay

## 2022-07-30 ENCOUNTER — Other Ambulatory Visit: Payer: Self-pay

## 2022-07-30 ENCOUNTER — Other Ambulatory Visit (HOSPITAL_COMMUNITY): Payer: Self-pay

## 2022-08-01 ENCOUNTER — Other Ambulatory Visit (HOSPITAL_COMMUNITY): Payer: Self-pay

## 2022-08-04 ENCOUNTER — Other Ambulatory Visit (HOSPITAL_COMMUNITY): Payer: Self-pay

## 2022-08-05 ENCOUNTER — Other Ambulatory Visit (HOSPITAL_COMMUNITY): Payer: Self-pay

## 2022-08-06 ENCOUNTER — Other Ambulatory Visit: Payer: Self-pay

## 2022-08-06 ENCOUNTER — Other Ambulatory Visit (HOSPITAL_COMMUNITY): Payer: Self-pay

## 2022-08-07 ENCOUNTER — Other Ambulatory Visit: Payer: Self-pay

## 2022-08-07 ENCOUNTER — Other Ambulatory Visit (HOSPITAL_COMMUNITY): Payer: Self-pay

## 2022-09-06 ENCOUNTER — Other Ambulatory Visit (HOSPITAL_COMMUNITY): Payer: Self-pay

## 2022-09-08 ENCOUNTER — Other Ambulatory Visit: Payer: Self-pay

## 2022-10-08 ENCOUNTER — Other Ambulatory Visit (HOSPITAL_COMMUNITY): Payer: Self-pay

## 2022-10-09 ENCOUNTER — Other Ambulatory Visit: Payer: Self-pay

## 2022-10-09 ENCOUNTER — Other Ambulatory Visit (HOSPITAL_COMMUNITY): Payer: Self-pay

## 2022-10-10 ENCOUNTER — Other Ambulatory Visit: Payer: Self-pay

## 2022-10-11 ENCOUNTER — Other Ambulatory Visit: Payer: Self-pay

## 2022-10-13 ENCOUNTER — Other Ambulatory Visit: Payer: Self-pay

## 2022-10-14 ENCOUNTER — Encounter: Payer: Self-pay | Admitting: Medical

## 2022-10-14 ENCOUNTER — Other Ambulatory Visit (HOSPITAL_COMMUNITY)
Admission: RE | Admit: 2022-10-14 | Discharge: 2022-10-14 | Disposition: A | Discharge status: Home / Self Care | Payer: Commercial Managed Care - PPO | Source: Ambulatory Visit | Attending: Medical | Admitting: Medical

## 2022-10-14 ENCOUNTER — Other Ambulatory Visit (HOSPITAL_COMMUNITY): Payer: Self-pay

## 2022-10-14 ENCOUNTER — Other Ambulatory Visit: Payer: Self-pay

## 2022-10-14 ENCOUNTER — Ambulatory Visit: Payer: Commercial Managed Care - PPO | Admitting: Medical

## 2022-10-14 VITALS — BP 128/78 | HR 80 | Ht 69.0 in | Wt 219.8 lb

## 2022-10-14 DIAGNOSIS — K219 Gastro-esophageal reflux disease without esophagitis: Secondary | ICD-10-CM

## 2022-10-14 DIAGNOSIS — Z1231 Encounter for screening mammogram for malignant neoplasm of breast: Secondary | ICD-10-CM

## 2022-10-14 DIAGNOSIS — K5909 Other constipation: Secondary | ICD-10-CM

## 2022-10-14 DIAGNOSIS — L602 Onychogryphosis: Secondary | ICD-10-CM | POA: Diagnosis not present

## 2022-10-14 DIAGNOSIS — Z7185 Encounter for immunization safety counseling: Secondary | ICD-10-CM | POA: Diagnosis not present

## 2022-10-14 DIAGNOSIS — Z8639 Personal history of other endocrine, nutritional and metabolic disease: Secondary | ICD-10-CM | POA: Diagnosis not present

## 2022-10-14 DIAGNOSIS — Z124 Encounter for screening for malignant neoplasm of cervix: Secondary | ICD-10-CM

## 2022-10-14 DIAGNOSIS — E559 Vitamin D deficiency, unspecified: Secondary | ICD-10-CM

## 2022-10-14 DIAGNOSIS — I10 Essential (primary) hypertension: Secondary | ICD-10-CM

## 2022-10-14 DIAGNOSIS — Z0000 Encounter for general adult medical examination without abnormal findings: Secondary | ICD-10-CM | POA: Diagnosis not present

## 2022-10-14 DIAGNOSIS — E118 Type 2 diabetes mellitus with unspecified complications: Secondary | ICD-10-CM

## 2022-10-14 DIAGNOSIS — E7849 Other hyperlipidemia: Secondary | ICD-10-CM

## 2022-10-14 LAB — POCT URINALYSIS DIP (PROADVANTAGE DEVICE)
Bilirubin, UA: NEGATIVE
Blood, UA: NEGATIVE
Glucose, UA: >=1000 mg/dL — AB
Ketones, POC UA: NEGATIVE mg/dL
Leukocytes, UA: NEGATIVE
Nitrite, UA: NEGATIVE
Protein Ur, POC: NEGATIVE mg/dL
Specific Gravity, Urine: 1.025
Urobilinogen, Ur: 0.2
pH, UA: 6 (ref 5.0–8.0)

## 2022-10-14 LAB — POCT GLYCOSYLATED HEMOGLOBIN (HGB A1C): Hemoglobin A1C: 6 % — AB (ref 4.0–5.6)

## 2022-10-14 MED ORDER — DEXCOM G6 SENSOR MISC
1.0000 | 11 refills | Status: AC
  Filled 2022-10-14 – 2022-10-15 (×2): qty 3, 30d supply, fill #0
  Filled 2023-01-26: qty 3, 30d supply, fill #1

## 2022-10-14 NOTE — Addendum Note (Signed)
Addended by: Jac Canavan on: 10/14/2022 04:51 PM   Modules accepted: Orders

## 2022-10-14 NOTE — Progress Notes (Addendum)
Subjective:   HPI  Jennifer Cervantes is a 42 y.o. female who presents for Chief Complaint  Patient presents with   Annual Exam    Fasting annual exam no pap- sees GYN. Asked if she would like Tdap today, declined. Would like to see if there is something less expensive Synjardy is $150 for 3 months.     Patient Care Team: Raesean Bartoletti, Kermit Balo, PA-C as PCP - General (Family Medicine) Wendall Stade, MD as PCP - Cardiology (Cardiology) Sees dentist Sees eye doctor Dr. Silas Flood, gynecology [facility] Dr. Levert Feinstein and Lindell Spar, NP, neurology Dr. Sandrea Hughs, pulmonology Dr. Nicholes Rough, podiatry   Concerns: Needs refill on dexcom sensor.  Needs to change syndardy as its too expensive.  Doing fine on mounjaro  Compliant with statin.  Compliant with BP medication  She has several specialist she has seen in the past but has not been to any specialist in recent year  Reviewed their medical, surgical, family, social, medication, and allergy history and updated chart as appropriate.  Past Medical History:  Diagnosis Date   Anemia    CAD (coronary artery disease)    a. LHC on 06/21/15 which showed 75% occl mid-dist LCx, 40% occl mRCA, 99% mid-dist LAD s/p DES and 50% occl mLAD s/p DES (overlapping stents) and significant LV dysfunction with anteroapical HK; b. 06/2015 Myoview: EF 55-65%, no ischemia/infarct.   CHF (congestive heart failure) (HCC)    Complication of anesthesia    adverse reaction to anesthesia-wakes up very confused, agitated, getting out of bed with eyes closed   GERD (gastroesophageal reflux disease)    Hyperlipidemia    Hypertension    Hypothyroidism    Myocardial stunning    a. EF 45% on 2D ECHO on 06/21/15 and severe LV dysfunction on LV gram by cath. Repeat 2D ECHO with EF 55-60% and mild HK of the apicoseptal myocardium.   Neuropraxia of right median nerve    a. suspected after right radial cath. will follow with neuro outpatient if does  not resolve.    NSTEMI (non-ST elevated myocardial infarction) (HCC) 05/2015   [place]   Obesity    Right radial artery thrombus (HCC)    a. 05/2015 Following PCI (radial access)-->conservatively managed.   Tobacco abuse    Tricuspid regurgitation    Type II diabetes mellitus (HCC)    a. Type II   Vitamin D deficiency     Past Surgical History:  Procedure Laterality Date   40 HOUR PH STUDY N/A 07/14/2016   Procedure: 24 HOUR PH STUDY;  Surgeon: Napoleon Form, MD;  Location: [facility];  Service: Endoscopy;  Laterality: N/A;   BREAST LUMPECTOMY WITH RADIOACTIVE SEED LOCALIZATION Right 01/09/2021   Procedure: RIGHT BREAST LUMPECTOMY WITH RADIOACTIVE SEED LOCALIZATION;  Surgeon: Griselda Miner, MD;  Location: [facility];  Service: General;  Laterality: Right;   CARDIAC CATHETERIZATION N/A 06/21/2015   Procedure: Left Heart Cath and Coronary Angiography;  Surgeon: Lyn Records, MD; pLAD 99%, CFX 75%, RCA 40%, EF 35%    CARDIAC CATHETERIZATION N/A 06/21/2015   Procedure: Coronary Stent Intervention;  Surgeon: Lyn Records, MD;  Promus Premier DES, 3.0 x 20 and 3.5 x 32 mm stents to the LAD   CARDIAC CATHETERIZATION N/A 06/21/2015   Procedure: Intravascular Ultrasound/IVUS;  Surgeon: Lyn Records, MD;  Location: [facility] INVASIVE CV LAB;  Service: Cardiovascular;  Laterality: N/A;  LAD   CARDIAC CATHETERIZATION  06/22/2015  Procedure: Coronary/Graft Angiography;  Surgeon: Peter M Swaziland, MD;  Location: [facility] INVASIVE CV LAB;  Service: Cardiovascular;;   CORONARY STENT INTERVENTION N/A 04/02/2016   Procedure: Coronary Stent Intervention;  Surgeon: Corky Crafts, MD;  Location: [facility] INVASIVE CV LAB;  Service: Cardiovascular;  Laterality: N/A;   ESOPHAGEAL MANOMETRY N/A 07/14/2016   Procedure: ESOPHAGEAL MANOMETRY (EM);  Surgeon: Napoleon Form, MD;  Location: [facility];  Service: Endoscopy;  Laterality: N/A;   LEFT HEART CATH AND CORONARY ANGIOGRAPHY N/A 04/02/2016    Procedure: Left Heart Cath and Coronary Angiography;  Surgeon: Corky Crafts, MD;  Location: [facility] INVASIVE CV LAB;  Service: Cardiovascular;  Laterality: N/A;   LEFT HEART CATH AND CORONARY ANGIOGRAPHY N/A 07/17/2016   Procedure: Left Heart Cath and Coronary Angiography;  Surgeon: Marykay Lex, MD;  Location: [facility] INVASIVE CV LAB;  Service: Cardiovascular;  Laterality: N/A;    Social History   Socioeconomic History   Marital status: Single    Spouse name: Not on file   Number of children: 4   Years of education: 12   Highest education level: Not on file  Occupational History   Occupation: CNA    Employer: [employer]  Tobacco Use   Smoking status: Former    Current packs/day: 0.00    Average packs/day: 0.5 packs/day for 23.0 years (11.5 ttl pk-yrs)    Types: Cigarettes    Start date: 12/19/1997    Quit date: 12/19/2020    Years since quitting: 1.8   Smokeless tobacco: Never  Vaping Use   Vaping status: Never Used  Substance and Sexual Activity   Alcohol use: No    Alcohol/week: 0.0 standard drinks of alcohol   Drug use: No   Sexual activity: Not on file  Other Topics Concern   Not on file  Social History Narrative   Lives in [place], works nights at [employer], [place]   Right-handed   Drinks 1 soda a day   Exercises some with walking   Lives with her son   Jennifer Cervantes her space, doesn't want a significant other   05/2019   Social Determinants of Health   Financial Resource Strain: Not on file  Food Insecurity: No Food Insecurity (10/14/2022)   Hunger Vital Sign    Worried About Running Out of Food in the Last Year: Never true    Ran Out of Food in the Last Year: Never true  Transportation Needs: No Transportation Needs (10/14/2022)   PRAPARE - Administrator, Civil Service (Medical): No    Lack of Transportation (Non-Medical): No  Physical Activity: Not on file  Stress: No Stress Concern Present (05/04/2019)   Received from [facility], [facility]    [facility] of Occupational Health - Occupational Stress Questionnaire    Feeling of Stress : Not at all  Social Connections: Not on file  Intimate Partner Violence: Not At Risk (10/14/2022)   Humiliation, Afraid, Rape, and Kick questionnaire    Fear of Current or Ex-Partner: No    Emotionally Abused: No    Physically Abused: No    Sexually Abused: No    Family History  Problem Relation Age of Onset   Diabetes Mother    Healthy Father    Healthy Sister    Healthy Brother    Diabetes Maternal Grandmother    Heart disease Maternal Grandmother    Diabetes Maternal Aunt        x 2   Heart attack  Neg Hx    Heart failure Neg Hx    Stroke Neg Hx    Hyperlipidemia Neg Hx    Hypertension Neg Hx    Stomach cancer Neg Hx    Colon cancer Neg Hx      Current Outpatient Medications:    aspirin EC (ASPIRIN LOW DOSE) 81 MG tablet, Take 1 tablet (81 mg total) by mouth daily., Disp: 90 tablet, Rfl: 3   Continuous Glucose Transmitter (DEXCOM G6 TRANSMITTER) MISC, use 1 transmitter every 90 days, Disp: 4 each, Rfl: 11   Empagliflozin-metFORMIN HCl (SYNJARDY) 06-998 MG TABS, Take 1 tablet by mouth 2 (two) times daily., Disp: 180 tablet, Rfl: 3   losartan (COZAAR) 50 MG tablet, Take 1 tablet (50 mg total) by mouth daily., Disp: 90 tablet, Rfl: 3   metoprolol tartrate (LOPRESSOR) 25 MG tablet, Take 1 tablet (25 mg total) by mouth 2 (two) times daily., Disp: 180 tablet, Rfl: 3   rosuvastatin (CRESTOR) 40 MG tablet, Take 1 tablet (40 mg total) by mouth daily., Disp: 90 tablet, Rfl: 3   tirzepatide (MOUNJARO) 5 MG/0.5ML Pen, Inject 5 mg into the skin once a week., Disp: 2 mL, Rfl: 5   VITAMIN D, CHOLECALCIFEROL, PO, Take 5,000 Int'l Units/L by mouth daily., Disp: , Rfl:    acetaminophen (TYLENOL) 500 MG tablet, Take 500 mg by mouth every 6 (six) hours as needed for headache (pain). (Patient not taking: Reported on 10/14/2022), Disp: , Rfl:    Continuous Glucose Sensor (DEXCOM G6 SENSOR) MISC,  Apply 1 sensor every 10 days., Disp: 4 each, Rfl: 11   HYDROcodone-acetaminophen (NORCO/VICODIN) 5-325 MG tablet, Take 1 tablet by mouth every 6 (six) hours as needed for moderate pain or severe pain. (Patient not taking: Reported on 10/14/2022), Disp: 10 tablet, Rfl: 0   nitroGLYCERIN (NITROSTAT) 0.4 MG SL tablet, Place 1 tablet (0.4 mg total) under the tongue every 5 (five) minutes as needed for chest pain. (Patient not taking: Reported on 10/14/2022), Disp: 25 tablet, Rfl: 6  No Known Allergies   Review of Systems  Constitutional:  Negative for chills, fever, malaise/fatigue and weight loss.  HENT:  Negative for congestion, ear pain, hearing loss, sore throat and tinnitus.   Eyes:  Negative for blurred vision, pain and redness.  Respiratory:  Negative for cough, hemoptysis and shortness of breath.   Cardiovascular:  Negative for chest pain, palpitations, orthopnea, claudication and leg swelling.  Gastrointestinal:  Negative for abdominal pain, blood in stool, constipation, diarrhea, nausea and vomiting.  Genitourinary:  Negative for dysuria, flank pain, frequency, hematuria and urgency.  Musculoskeletal:  Negative for falls, joint pain and myalgias.  Skin:  Negative for itching and rash.  Neurological:  Negative for dizziness, tingling, speech change, weakness and headaches.  Endo/Heme/Allergies:  Negative for polydipsia. Does not bruise/bleed easily.  Psychiatric/Behavioral:  Negative for depression and memory loss. The patient is not nervous/anxious and does not have insomnia.        Objective:  BP 128/78   Pulse 80   Ht 5\' 9"  (1.753 m)   Wt 219 lb 12.8 oz (99.7 kg)   SpO2 98%   BMI 32.46 kg/m   Wt Readings from Last 3 Encounters:  10/14/22 219 lb 12.8 oz (99.7 kg)  04/08/22 218 lb 3.2 oz (99 kg)  11/21/21 225 lb 3.2 oz (102.2 kg)    General appearance: alert, no distress, WD/WN, African American female Skin: no worrisome lesions HEENT unremarkable Neck: supple, no  lymphadenopathy, no thyromegaly, no masses,  normal ROM, no bruits Chest: non tender, normal shape and expansion Heart: RRR, normal S1, S2, no murmurs Lungs: CTA bilaterally, no wheezes, rhonchi, or rales Abdomen: +bs, soft, non tender, non distended, no masses, no hepatomegaly, no splenomegaly, no bruits Back: non tender, normal ROM, no scoliosis Musculoskeletal:  upper extremities non tender, no obvious deformity, normal ROM throughout, lower extremities non tender, no obvious deformity, normal ROM throughout Extremities: varicose veins right lateral leg, scattered lower leg spider veins, no edema, no cyanosis, no clubbing Pulses: 1+ right radial pulse, otherwise 2+ symmetric, upper and lower extremities, normal cap refill Neurological: alert, oriented x 3, CN2-12 intact, strength normal upper extremities and lower extremities, sensation normal throughout, DTRs 2+ throughout, no cerebellar signs, gait normal Psychiatric: normal affect, behavior normal, pleasant  Breast: nontender, no masses or lumps, no skin changes, no nipple discharge or inversion, no axillary lymphadenopathy Gyn: Normal external genitalia without lesions, vagina with normal mucosa, cervix with pinkish coloration superiorly otherwise no new lesions,, no cervical motion tenderness, no abnormal vaginal discharge.  Uterus and adnexa not enlarged, nontender, no masses.  Pap performed.  Exam chaperoned by nurse. Rectal: Anus normal appearing    Diabetic Foot Exam - Simple   Simple Foot Form Diabetic Foot exam was performed with the following findings: Yes 10/14/2022  3:37 PM  Visual Inspection See comments: Yes Sensation Testing Intact to touch and monofilament testing bilaterally: Yes Pulse Check Posterior Tibialis and Dorsalis pulse intact bilaterally: Yes Comments Several small plantar warts on both soles, mild callous right latera 5th toe at MTP, some hypertrophic nails       Assessment and Plan :   Encounter  Diagnoses  Name Primary?   Encounter for health maintenance examination in adult Yes   Type II diabetes mellitus with complication (HCC)    Vitamin D deficiency    Hypertrophic toenail    Other hyperlipidemia    Gastroesophageal reflux disease, unspecified whether esophagitis present    Essential hypertension    Chronic constipation    History of iron deficiency    Vaccine counseling    Screening for cervical cancer    Encounter for screening mammogram for malignant neoplasm of breast     Physical exam - discussed and counseled on healthy lifestyle, diet, exercise, preventative care, vaccinations, sick and well care, proper use of emergency dept and after hours care, and addressed their concerns.    Health screening: Advised they see their eye doctor yearly for routine vision care. Advised they see their dentist yearly for routine dental care including hygiene visits twice yearly. See your gynecologist yearly for routine gynecological care.   Vaccines: I recommend yearly flu shot soon in the fall  You are due for tetanus booster next year 2025    Cancer screening: You are due for pap smear and mammogram  Due for colonoscopy age 29  Check skin regularly for new or changing moles or macules.  Discussed routine labs    Separate significant chronic issues discussed: Diabetes Exercise regularly Check feet daily for sores or wounds and let us know if something is not healing appropriately Continue Dexcom glucose monitoring.  Goal was less than 130 sugars before meals, less than 200 postprandial 2 hours after a meal Stop Synjardy due to cost , plan to increase mounjaro dose, labs today  Hyperlipidemia-continue cholesterol medication crestor 40mg  daily  High blood pressure-continue blood pressure medication metoprolol 25mg  BID, losartan 50mg  daily  History of heart disease-i reviewed 08/2021 cardiology notes.  Due now  for yearly f/u  Vitamin D deficiency-continue vitamin D  supplement  Decreased pulses in feet-ABI reviewed from 07/2021  Jeneane was seen today for annual exam.  Diagnoses and all orders for this visit:  Encounter for health maintenance examination in adult -     POCT Urinalysis DIP (Proadvantage Device) -     Comprehensive metabolic panel -     CBC -     Microalbumin/Creatinine Ratio, Urine -     VITAMIN D 25 Hydroxy (Vit-D Deficiency, Fractures) -     Lipid panel -     Iron -     TSH -     Cytology - PAP([place]) -     MM 3D SCREENING MAMMOGRAM BILATERAL BREAST  Type II diabetes mellitus with complication (HCC) -     HgB A1c -     Continuous Glucose Sensor (DEXCOM G6 SENSOR) MISC; Apply 1 sensor every 10 days. -     Microalbumin/Creatinine Ratio, Urine  Vitamin D deficiency -     VITAMIN D 25 Hydroxy (Vit-D Deficiency, Fractures)  Hypertrophic toenail  Other hyperlipidemia -     Comprehensive metabolic panel -     Lipid panel  Gastroesophageal reflux disease, unspecified whether esophagitis present  Essential hypertension -     TSH  Chronic constipation -     TSH  History of iron deficiency -     Iron  Vaccine counseling  Screening for cervical cancer -     Cytology - PAP([place])  Encounter for screening mammogram for malignant neoplasm of breast -     MM 3D SCREENING MAMMOGRAM BILATERAL BREAST   Follow-up pending labs, every 4 to 6 months for diabetes, yearly for physical

## 2022-10-15 ENCOUNTER — Other Ambulatory Visit (HOSPITAL_COMMUNITY): Payer: Self-pay

## 2022-10-15 ENCOUNTER — Other Ambulatory Visit: Payer: Self-pay | Admitting: Medical

## 2022-10-15 ENCOUNTER — Other Ambulatory Visit: Payer: Self-pay

## 2022-10-15 LAB — CBC
Hematocrit: 38.2 % (ref 34.0–46.6)
Hemoglobin: 12.9 g/dL (ref 11.1–15.9)
MCH: 31.5 pg (ref 26.6–33.0)
MCHC: 33.8 g/dL (ref 31.5–35.7)
MCV: 93 fL (ref 79–97)
Platelets: 233 10*3/uL (ref 150–450)
RBC: 4.1 x10E6/uL (ref 3.77–5.28)
RDW: 12.7 % (ref 11.7–15.4)
WBC: 7.8 10*3/uL (ref 3.4–10.8)

## 2022-10-15 LAB — COMPREHENSIVE METABOLIC PANEL
ALT: 14 IU/L (ref 0–32)
AST: 18 IU/L (ref 0–40)
Albumin: 4.5 g/dL (ref 3.9–4.9)
Alkaline Phosphatase: 87 IU/L (ref 44–121)
BUN/Creatinine Ratio: 10 (ref 9–23)
BUN: 10 mg/dL (ref 6–24)
Bilirubin Total: 0.3 mg/dL (ref 0.0–1.2)
CO2: 18 mmol/L — ABNORMAL LOW (ref 20–29)
Calcium: 9.5 mg/dL (ref 8.7–10.2)
Chloride: 108 mmol/L — ABNORMAL HIGH (ref 96–106)
Creatinine, Ser: 1 mg/dL (ref 0.57–1.00)
Globulin, Total: 2.6 g/dL (ref 1.5–4.5)
Glucose: 105 mg/dL — ABNORMAL HIGH (ref 70–99)
Potassium: 3.8 mmol/L (ref 3.5–5.2)
Sodium: 141 mmol/L (ref 134–144)
Total Protein: 7.1 g/dL (ref 6.0–8.5)
eGFR: 72 mL/min/{1.73_m2} (ref >59)

## 2022-10-15 LAB — LIPID PANEL
Chol/HDL Ratio: 2.6 ratio (ref 0.0–4.4)
Cholesterol, Total: 115 mg/dL (ref 100–199)
HDL: 45 mg/dL (ref >39)
LDL Chol Calc (NIH): 58 mg/dL (ref 0–99)
Triglycerides: 53 mg/dL (ref 0–149)
VLDL Cholesterol Cal: 12 mg/dL (ref 5–40)

## 2022-10-15 LAB — IRON: Iron: 57 ug/dL (ref 27–159)

## 2022-10-15 LAB — TSH: TSH: 0.819 u[IU]/mL (ref 0.450–4.500)

## 2022-10-15 LAB — MICROALBUMIN / CREATININE URINE RATIO
Creatinine, Urine: 169.5 mg/dL
Microalb/Creat Ratio: 7 mg/g{creat} (ref 0–29)
Microalbumin, Urine: 11.2 ug/mL

## 2022-10-15 LAB — VITAMIN D 25 HYDROXY (VIT D DEFICIENCY, FRACTURES): Vit D, 25-Hydroxy: 18.1 ng/mL — ABNORMAL LOW (ref 30.0–100.0)

## 2022-10-15 MED ORDER — ASPIRIN 81 MG PO TBEC
81.0000 mg | DELAYED_RELEASE_TABLET | Freq: Every day | ORAL | 3 refills | Status: DC
  Filled 2022-10-15 (×2): qty 90, 90d supply, fill #0

## 2022-10-15 MED ORDER — DEXCOM G6 TRANSMITTER MISC
1.0000 | 0 refills | Status: AC
  Filled 2022-10-15: qty 1, 90d supply, fill #0

## 2022-10-15 MED ORDER — LOSARTAN POTASSIUM 50 MG PO TABS
50.0000 mg | ORAL_TABLET | Freq: Every day | ORAL | 3 refills | Status: AC
  Filled 2022-10-15 (×2): qty 90, 90d supply, fill #0

## 2022-10-15 MED ORDER — VITAMIN D (ERGOCALCIFEROL) 1.25 MG (50000 UNIT) PO CAPS
50000.0000 [IU] | ORAL_CAPSULE | ORAL | 3 refills | Status: AC
Start: 2022-10-15 — End: ?
  Filled 2022-10-15 (×2): qty 12, 84d supply, fill #0
  Filled 2023-02-07 – 2023-02-18 (×2): qty 12, 84d supply, fill #1
  Filled 2023-06-09: qty 12, 84d supply, fill #2
  Filled 2023-09-19: qty 12, 84d supply, fill #3

## 2022-10-15 MED ORDER — MOUNJARO 7.5 MG/0.5ML ~~LOC~~ SOAJ
7.5000 mg | SUBCUTANEOUS | 0 refills | Status: DC
  Filled 2022-10-15 – 2022-11-08 (×4): qty 2, 28d supply, fill #0

## 2022-10-15 MED ORDER — MOUNJARO 10 MG/0.5ML ~~LOC~~ SOAJ
10.0000 mg | SUBCUTANEOUS | 1 refills | Status: DC
  Filled 2022-10-15 – 2022-12-06 (×3): qty 2, 28d supply, fill #0
  Filled 2023-01-26: qty 2, 28d supply, fill #1

## 2022-10-15 NOTE — Progress Notes (Signed)
Results sent through MyChart

## 2022-10-16 ENCOUNTER — Other Ambulatory Visit: Payer: Self-pay

## 2022-10-16 ENCOUNTER — Other Ambulatory Visit (HOSPITAL_COMMUNITY): Payer: Self-pay

## 2022-10-16 LAB — CYTOLOGY - PAP
Comment: NEGATIVE
Diagnosis: NEGATIVE
High risk HPV: NEGATIVE

## 2022-10-17 NOTE — Progress Notes (Signed)
Results sent through MyChart

## 2022-11-08 ENCOUNTER — Other Ambulatory Visit (HOSPITAL_COMMUNITY): Payer: Self-pay

## 2022-11-10 ENCOUNTER — Other Ambulatory Visit: Payer: Self-pay

## 2022-11-12 ENCOUNTER — Encounter: Payer: Commercial Managed Care - PPO | Admitting: Medical

## 2022-11-15 LAB — HM DIABETES EYE EXAM

## 2022-11-27 ENCOUNTER — Encounter: Payer: 59 | Admitting: Medical

## 2022-12-03 ENCOUNTER — Ambulatory Visit
Admission: RE | Admit: 2022-12-03 | Discharge: 2022-12-03 | Disposition: A | Discharge status: Home / Self Care | Payer: Commercial Managed Care - PPO | Source: Ambulatory Visit | Attending: Medical | Admitting: Medical

## 2022-12-04 ENCOUNTER — Other Ambulatory Visit: Payer: Self-pay | Admitting: Medical

## 2022-12-04 DIAGNOSIS — N63 Unspecified lump in unspecified breast: Secondary | ICD-10-CM

## 2022-12-07 ENCOUNTER — Other Ambulatory Visit (HOSPITAL_COMMUNITY): Payer: Self-pay

## 2022-12-08 ENCOUNTER — Other Ambulatory Visit: Payer: Self-pay

## 2022-12-08 ENCOUNTER — Other Ambulatory Visit (HOSPITAL_COMMUNITY): Payer: Self-pay

## 2023-01-27 ENCOUNTER — Other Ambulatory Visit (HOSPITAL_COMMUNITY): Payer: Self-pay

## 2023-01-27 ENCOUNTER — Other Ambulatory Visit: Payer: Self-pay

## 2023-02-05 ENCOUNTER — Other Ambulatory Visit: Payer: Self-pay

## 2023-02-05 ENCOUNTER — Other Ambulatory Visit (HOSPITAL_COMMUNITY): Payer: Self-pay

## 2023-02-05 ENCOUNTER — Ambulatory Visit: Payer: Commercial Managed Care - PPO | Admitting: Medical

## 2023-02-05 VITALS — BP 120/74 | HR 86 | Wt 215.2 lb

## 2023-02-05 DIAGNOSIS — I10 Essential (primary) hypertension: Secondary | ICD-10-CM

## 2023-02-05 DIAGNOSIS — Z8639 Personal history of other endocrine, nutritional and metabolic disease: Secondary | ICD-10-CM | POA: Diagnosis not present

## 2023-02-05 DIAGNOSIS — E7849 Other hyperlipidemia: Secondary | ICD-10-CM | POA: Diagnosis not present

## 2023-02-05 DIAGNOSIS — E118 Type 2 diabetes mellitus with unspecified complications: Secondary | ICD-10-CM | POA: Diagnosis not present

## 2023-02-05 DIAGNOSIS — E559 Vitamin D deficiency, unspecified: Secondary | ICD-10-CM

## 2023-02-05 LAB — POCT GLYCOSYLATED HEMOGLOBIN (HGB A1C): Hemoglobin A1C: 5.9 % — AB (ref 4.0–5.6)

## 2023-02-05 MED ORDER — METOPROLOL TARTRATE 25 MG PO TABS
25.0000 mg | ORAL_TABLET | Freq: Two times a day (BID) | ORAL | 3 refills | Status: DC
  Filled 2023-02-05: qty 180, 90d supply, fill #0

## 2023-02-05 MED ORDER — MOUNJARO 10 MG/0.5ML ~~LOC~~ SOAJ
10.0000 mg | SUBCUTANEOUS | 2 refills | Status: DC
  Filled 2023-02-05 – 2023-02-19 (×3): qty 2, 28d supply, fill #0
  Filled 2023-04-08: qty 2, 28d supply, fill #1
  Filled 2023-05-04: qty 2, 28d supply, fill #2

## 2023-02-05 NOTE — Progress Notes (Signed)
Subjective:  Jennifer Cervantes is a 42 y.o. female who presents for Chief Complaint  Patient presents with   Medical Management of Chronic Issues    Med check- wants A1c checked- [facility]- [place]     Here for med check.  Diabetes-compliant with Mounjaro 10 mg daily.  Doing fine on this current dosing.  He has a Dexcom monitor.  Blood sugars have been looking good in the normal range.  No hypoglycemia.  No side effects of current medication  Hyperlipidemia-compliant with Crestor and aspirin with no complaint  Hypertension-compliant with losartan and metoprolol without complaint or symptom  Last visit vitamin D was continued to be low.  She is using the vitamin D supplement  No other c/o.  Past Medical History:  Diagnosis Date   Anemia    CAD (coronary artery disease)    a. LHC on 06/21/15 which showed 75% occl mid-dist LCx, 40% occl mRCA, 99% mid-dist LAD s/p DES and 50% occl mLAD s/p DES (overlapping stents) and significant LV dysfunction with anteroapical HK; b. 06/2015 Myoview: EF 55-65%, no ischemia/infarct.   CHF (congestive heart failure) (HCC)    Complication of anesthesia    adverse reaction to anesthesia-wakes up very confused, agitated, getting out of bed with eyes closed   GERD (gastroesophageal reflux disease)    Hyperlipidemia    Hypertension    Hypothyroidism    Myocardial stunning    a. EF 45% on 2D ECHO on 06/21/15 and severe LV dysfunction on LV gram by cath. Repeat 2D ECHO with EF 55-60% and mild HK of the apicoseptal myocardium.   Neuropraxia of right median nerve    a. suspected after right radial cath. will follow with neuro outpatient if does not resolve.    NSTEMI (non-ST elevated myocardial infarction) (HCC) 05/2015   [place]   Obesity    Right radial artery thrombus (HCC)    a. 05/2015 Following PCI (radial access)-->conservatively managed.   Tobacco abuse    Tricuspid regurgitation    Type II diabetes mellitus (HCC)    a. Type II   Vitamin D  deficiency    Current Outpatient Medications on File Prior to Visit  Medication Sig Dispense Refill   aspirin EC (ASPIRIN LOW DOSE) 81 MG tablet Take 1 tablet (81 mg total) by mouth daily. 90 tablet 3   Continuous Glucose Sensor (DEXCOM G6 SENSOR) MISC Apply 1 sensor every 10 days. 4 each 11   Continuous Glucose Transmitter (DEXCOM G6 TRANSMITTER) MISC use 1 transmitter every 90 days 4 each 0   losartan (COZAAR) 50 MG tablet Take 1 tablet (50 mg total) by mouth daily. 90 tablet 3   rosuvastatin (CRESTOR) 40 MG tablet Take 1 tablet (40 mg total) by mouth daily. 90 tablet 3   Vitamin D, Ergocalciferol, (DRISDOL) 1.25 MG (50000 UNIT) CAPS capsule Take 1 capsule (50,000 Units total) by mouth every 7 (seven) days. 12 capsule 3   nitroGLYCERIN (NITROSTAT) 0.4 MG SL tablet Place 1 tablet (0.4 mg total) under the tongue every 5 (five) minutes as needed for chest pain. (Patient not taking: Reported on 10/14/2022) 25 tablet 6   No current facility-administered medications on file prior to visit.     The following portions of the patient's history were reviewed and updated as appropriate: allergies, current medications, past family history, past medical history, past social history, past surgical history and problem list.  ROS Otherwise as in subjective above  Objective: BP 120/74   Pulse 86   Wt  215 lb 3.2 oz (97.6 kg)   BMI 31.78 kg/m   General appearance: alert, no distress, well developed, well nourished Neck: supple, no lymphadenopathy, no thyromegaly, no masses Heart: RRR, normal S1, S2, no murmurs Lungs: CTA bilaterally, no wheezes, rhonchi, or rales Pulses: 2+ radial pulses, 2+ pedal pulses, normal cap refill Ext: no edema   Assessment: Encounter Diagnoses  Name Primary?   Type II diabetes mellitus with complication (HCC) Yes   Vitamin D deficiency    Other hyperlipidemia    History of iron deficiency    Essential hypertension      Plan: Diabetes-she wants to leave the  [facility] to 10 mg dose.  Continue current dosing.  Continue Dexcom glucose monitoring..  Hemoglobin A1c at goal today.  Vitamin D deficiency-last visit we started vitamin D supplement.  Updated labs today.  Continue supplement  Hyperlipidemia-continue rosuvastatin 40 mg daily and aspirin 81 mg daily  History of iron deficiency-at her recent visit in August 2024 iron levels were normal  Hypertension-continue losartan 50 mg daily, metoprolol 25 mg twice daily  Meredith was seen today for medical management of chronic issues.  Diagnoses and all orders for this visit:  Type II diabetes mellitus with complication (HCC) -     HgB A1c -     Cancel: Hemoglobin A1c -     Basic metabolic panel  Vitamin D deficiency -     VITAMIN D 25 Hydroxy (Vit-D Deficiency, Fractures)  Other hyperlipidemia  History of iron deficiency  Essential hypertension  Other orders -     tirzepatide (MOUNJARO) 10 MG/0.5ML Pen; Inject 10 mg into the skin once a week. -     metoprolol tartrate (LOPRESSOR) 25 MG tablet; Take 1 tablet (25 mg total) by mouth 2 (two) times daily.    Follow up: 19mo

## 2023-02-06 ENCOUNTER — Other Ambulatory Visit (HOSPITAL_COMMUNITY)
Admission: RE | Admit: 2023-02-06 | Discharge: 2023-02-06 | Disposition: A | Discharge status: Home / Self Care | Payer: Commercial Managed Care - PPO | Source: Ambulatory Visit | Attending: Medical | Admitting: Medical

## 2023-02-06 DIAGNOSIS — E559 Vitamin D deficiency, unspecified: Secondary | ICD-10-CM | POA: Diagnosis not present

## 2023-02-06 DIAGNOSIS — E118 Type 2 diabetes mellitus with unspecified complications: Secondary | ICD-10-CM | POA: Diagnosis not present

## 2023-02-06 LAB — BASIC METABOLIC PANEL
Anion gap: 6 (ref 5–15)
BUN: 9 mg/dL (ref 6–20)
CO2: 23 mmol/L (ref 22–32)
Calcium: 9.1 mg/dL (ref 8.9–10.3)
Chloride: 110 mmol/L (ref 98–111)
Creatinine, Ser: 0.95 mg/dL (ref 0.44–1.00)
GFR, Estimated: >60 mL/min (ref >60)
Glucose, Bld: 112 mg/dL — ABNORMAL HIGH (ref 70–99)
Potassium: 3.5 mmol/L (ref 3.5–5.1)
Sodium: 139 mmol/L (ref 135–145)

## 2023-02-06 LAB — VITAMIN D 25 HYDROXY (VIT D DEFICIENCY, FRACTURES): Vit D, 25-Hydroxy: 44.48 ng/mL (ref 30–100)

## 2023-02-06 NOTE — Progress Notes (Signed)
Results sent through MyChart

## 2023-02-07 ENCOUNTER — Other Ambulatory Visit: Payer: Self-pay

## 2023-02-09 ENCOUNTER — Other Ambulatory Visit: Payer: Self-pay

## 2023-02-09 NOTE — Progress Notes (Signed)
Results sent through MyChart

## 2023-02-12 ENCOUNTER — Other Ambulatory Visit: Payer: Self-pay

## 2023-02-18 ENCOUNTER — Other Ambulatory Visit (HOSPITAL_COMMUNITY): Payer: Self-pay

## 2023-02-19 ENCOUNTER — Other Ambulatory Visit: Payer: Self-pay

## 2023-04-08 ENCOUNTER — Other Ambulatory Visit: Payer: Self-pay

## 2023-05-05 ENCOUNTER — Other Ambulatory Visit (HOSPITAL_COMMUNITY): Payer: Self-pay

## 2023-05-15 ENCOUNTER — Encounter: Payer: Self-pay | Admitting: Cardiovascular Disease

## 2023-06-09 ENCOUNTER — Other Ambulatory Visit: Payer: Self-pay | Admitting: Medical

## 2023-06-10 ENCOUNTER — Other Ambulatory Visit: Payer: Self-pay

## 2023-06-10 ENCOUNTER — Other Ambulatory Visit (HOSPITAL_COMMUNITY): Payer: Self-pay

## 2023-06-10 MED ORDER — MOUNJARO 10 MG/0.5ML ~~LOC~~ SOAJ
10.0000 mg | SUBCUTANEOUS | 2 refills | Status: DC
  Filled 2023-06-10: qty 2, 28d supply, fill #0
  Filled 2023-07-07: qty 2, 28d supply, fill #1
  Filled 2023-07-31: qty 2, 28d supply, fill #2

## 2023-07-07 ENCOUNTER — Other Ambulatory Visit (HOSPITAL_COMMUNITY): Payer: Self-pay

## 2023-07-31 ENCOUNTER — Other Ambulatory Visit (HOSPITAL_COMMUNITY): Payer: Self-pay

## 2023-07-31 ENCOUNTER — Other Ambulatory Visit: Payer: Self-pay

## 2023-08-13 ENCOUNTER — Encounter: Payer: Commercial Managed Care - PPO | Admitting: Family Medicine

## 2023-09-19 ENCOUNTER — Other Ambulatory Visit: Payer: Self-pay | Admitting: Medical

## 2023-09-19 ENCOUNTER — Other Ambulatory Visit (HOSPITAL_COMMUNITY): Payer: Self-pay

## 2023-09-21 ENCOUNTER — Other Ambulatory Visit (HOSPITAL_COMMUNITY): Payer: Self-pay

## 2023-09-21 ENCOUNTER — Other Ambulatory Visit: Payer: Self-pay

## 2023-09-21 MED ORDER — MOUNJARO 10 MG/0.5ML ~~LOC~~ SOAJ
10.0000 mg | SUBCUTANEOUS | 0 refills | Status: DC
  Filled 2023-09-21: qty 2, 28d supply, fill #0

## 2023-09-21 NOTE — Telephone Encounter (Signed)
Has an appt. 8/21

## 2023-09-29 ENCOUNTER — Other Ambulatory Visit (HOSPITAL_COMMUNITY): Payer: Self-pay

## 2023-10-15 ENCOUNTER — Ambulatory Visit: Payer: Self-pay | Admitting: Medical

## 2023-10-15 ENCOUNTER — Ambulatory Visit (INDEPENDENT_AMBULATORY_CARE_PROVIDER_SITE_OTHER): Payer: Commercial Managed Care - PPO | Admitting: Medical

## 2023-10-15 ENCOUNTER — Encounter: Payer: Self-pay | Admitting: Medical

## 2023-10-15 VITALS — BP 122/74 | HR 100 | Ht 69.0 in | Wt 208.0 lb

## 2023-10-15 DIAGNOSIS — E118 Type 2 diabetes mellitus with unspecified complications: Secondary | ICD-10-CM | POA: Diagnosis not present

## 2023-10-15 DIAGNOSIS — Z0000 Encounter for general adult medical examination without abnormal findings: Secondary | ICD-10-CM | POA: Diagnosis not present

## 2023-10-15 DIAGNOSIS — E559 Vitamin D deficiency, unspecified: Secondary | ICD-10-CM

## 2023-10-15 DIAGNOSIS — E7849 Other hyperlipidemia: Secondary | ICD-10-CM

## 2023-10-15 DIAGNOSIS — I10 Essential (primary) hypertension: Secondary | ICD-10-CM

## 2023-10-15 DIAGNOSIS — N6019 Diffuse cystic mastopathy of unspecified breast: Secondary | ICD-10-CM

## 2023-10-15 LAB — LIPID PANEL

## 2023-10-15 NOTE — Progress Notes (Signed)
 abstract

## 2023-10-15 NOTE — Progress Notes (Signed)
 Subjective:   HPI  Jennifer Cervantes is a 43 y.o. female who presents for Chief Complaint  Patient presents with   Annual Exam    Patient Care Team: Shaurya Rawdon, Alm RAMAN, PA-C as PCP - General (Family Medicine) [name] Maude JAYSON, MD as PCP - Cardiology (Cardiology) Sees dentist Sees eye doctor Dr. Lavaun Slicker, gynecology [facility] Dr. Modena Callander and Rosaline Sable, NP, neurology Dr. Ozell America, pulmonology Dr. Franky Blanch, podiatry   Concerns: Fasting, doing fine, no particularly issues  Working part time.  Exercise with walking regularly  Compliant with statin and aspirin .  Compliant with BP medication  Diabetes - compliant with mounjaro  10mg  and doesn't want to increase   Reviewed their medical, surgical, family, social, medication, and allergy history and updated chart as appropriate.  Past Medical History:  Diagnosis Date   Anemia    CAD (coronary artery disease)    a. LHC on 06/21/15 which showed 75% occl mid-dist LCx, 40% occl mRCA, 99% mid-dist LAD s/p DES and 50% occl mLAD s/p DES (overlapping stents) and significant LV dysfunction with anteroapical HK; b. 06/2015 Myoview : EF 55-65%, no ischemia/infarct.   CHF (congestive heart failure) (HCC)    Complication of anesthesia    adverse reaction to anesthesia-wakes up very confused, agitated, getting out of bed with eyes closed   GERD (gastroesophageal reflux disease)    Hyperlipidemia    Hypertension    Hypothyroidism    Myocardial stunning    a. EF 45% on 2D ECHO on 06/21/15 and severe LV dysfunction on LV gram by cath. Repeat 2D ECHO with EF 55-60% and mild HK of the apicoseptal myocardium.   Neuropraxia of right median nerve    a. suspected after right radial cath. will follow with neuro outpatient if does not resolve.    NSTEMI (non-ST elevated myocardial infarction) (HCC) 05/2015   [place]   Obesity    Right radial artery thrombus (HCC)    a. 05/2015 Following PCI (radial  access)-->conservatively managed.   Tobacco abuse    Tricuspid regurgitation    Type II diabetes mellitus (HCC)    a. Type II   Vitamin D  deficiency     Past Surgical History:  Procedure Laterality Date   26 HOUR PH STUDY N/A 07/14/2016   Procedure: 24 HOUR PH STUDY;  Surgeon: Shila Gustav GAILS, MD;  Location: [facility];  Service: Endoscopy;  Laterality: N/A;   BREAST LUMPECTOMY WITH RADIOACTIVE SEED LOCALIZATION Right 01/09/2021   Procedure: RIGHT BREAST LUMPECTOMY WITH RADIOACTIVE SEED LOCALIZATION;  Surgeon: Curvin Deward MOULD, MD;  Location: [facility];  Service: General;  Laterality: Right;   CARDIAC CATHETERIZATION N/A 06/21/2015   Procedure: Left Heart Cath and Coronary Angiography;  Surgeon: Victory LELON Sharps, MD; pLAD 99%, CFX 75%, RCA 40%, EF 35%    CARDIAC CATHETERIZATION N/A 06/21/2015   Procedure: Coronary Stent Intervention;  Surgeon: Victory LELON Sharps, MD;  Promus Premier DES, 3.0 x 20 and 3.5 x 32 mm stents to the LAD   CARDIAC CATHETERIZATION N/A 06/21/2015   Procedure: Intravascular Ultrasound/IVUS;  Surgeon: Victory LELON Sharps, MD;  Location: [facility] INVASIVE CV LAB;  Service: Cardiovascular;  Laterality: N/A;  LAD   CARDIAC CATHETERIZATION  06/22/2015   Procedure: Coronary/Graft Angiography;  Surgeon: Peter M Swaziland, MD;  Location: [facility] INVASIVE CV LAB;  Service: Cardiovascular;;   CORONARY STENT INTERVENTION N/A 04/02/2016   Procedure: Coronary Stent Intervention;  Surgeon: Candyce RAMAN Reek, MD;  Location: [facility] INVASIVE CV LAB;  Service: Cardiovascular;  Laterality: N/A;   ESOPHAGEAL MANOMETRY N/A 07/14/2016   Procedure: ESOPHAGEAL MANOMETRY (EM);  Surgeon: Shila Gustav GAILS, MD;  Location: [facility];  Service: Endoscopy;  Laterality: N/A;   LEFT HEART CATH AND CORONARY ANGIOGRAPHY N/A 04/02/2016   Procedure: Left Heart Cath and Coronary Angiography;  Surgeon: Candyce GORMAN Reek, MD;  Location: [facility] INVASIVE CV LAB;  Service: Cardiovascular;  Laterality: N/A;   LEFT HEART CATH  AND CORONARY ANGIOGRAPHY N/A 07/17/2016   Procedure: Left Heart Cath and Coronary Angiography;  Surgeon: Anner Alm ORN, MD;  Location: [facility] INVASIVE CV LAB;  Service: Cardiovascular;  Laterality: N/A;    Social History   Socioeconomic History   Marital status: Single    Spouse name: Not on file   Number of children: 4   Years of education: 12   Highest education level: Not on file  Occupational History   Occupation: CNA    Employer: [employer]  Tobacco Use   Smoking status: Former    Current packs/day: 0.00    Average packs/day: 0.5 packs/day for 23.0 years (11.5 ttl pk-yrs)    Types: Cigarettes    Start date: 12/19/1997    Quit date: 12/19/2020    Years since quitting: 2.8   Smokeless tobacco: Never  Vaping Use   Vaping status: Never Used  Substance and Sexual Activity   Alcohol use: No    Alcohol/week: 0.0 standard drinks of alcohol   Drug use: No   Sexual activity: Not on file  Other Topics Concern   Not on file  Social History Narrative   Lives in [place], works nights at [employer], [place]   Right-handed   Drinks 1 soda a day   Exercises some with walking   Lives with her son   Yvonne her space, doesn't want a significant other   05/2019   Social Drivers of Health   Financial Resource Strain: Not on file  Food Insecurity: No Food Insecurity (10/14/2022)   Hunger Vital Sign    Worried About Running Out of Food in the Last Year: Never true    Ran Out of Food in the Last Year: Never true  Transportation Needs: No Transportation Needs (10/14/2022)   PRAPARE - Administrator, Civil Service (Medical): No    Lack of Transportation (Non-Medical): No  Physical Activity: Not on file  Stress: No Stress Concern Present (05/04/2019)   Received from [facility] of Occupational Health - Occupational Stress Questionnaire    Feeling of Stress : Not at all  Social Connections: Not on file  Intimate Partner Violence: Not At Risk (10/14/2022)    Humiliation, Afraid, Rape, and Kick questionnaire    Fear of Current or Ex-Partner: No    Emotionally Abused: No    Physically Abused: No    Sexually Abused: No    Family History  Problem Relation Age of Onset   Diabetes Mother    Healthy Father    Healthy Sister    Healthy Brother    Diabetes Maternal Grandmother    Heart disease Maternal Grandmother    Diabetes Maternal Aunt        x 2   Heart attack Neg Hx    Heart failure Neg Hx    Stroke Neg Hx    Hyperlipidemia Neg Hx    Hypertension Neg Hx    Stomach cancer Neg Hx    Colon cancer Neg Hx      Current  Outpatient Medications:    aspirin  EC (ASPIRIN  LOW DOSE) 81 MG tablet, Take 1 tablet (81 mg total) by mouth daily., Disp: 90 tablet, Rfl: 3   Continuous Glucose Sensor (DEXCOM G6 SENSOR) MISC, Apply 1 sensor every 10 days., Disp: 4 each, Rfl: 11   Continuous Glucose Transmitter (DEXCOM G6 TRANSMITTER) MISC, use 1 transmitter every 90 days, Disp: 4 each, Rfl: 0   losartan  (COZAAR ) 50 MG tablet, Take 1 tablet (50 mg total) by mouth daily., Disp: 90 tablet, Rfl: 3   metoprolol  tartrate (LOPRESSOR ) 25 MG tablet, Take 1 tablet (25 mg total) by mouth 2 (two) times daily., Disp: 180 tablet, Rfl: 3   nitroGLYCERIN  (NITROSTAT ) 0.4 MG SL tablet, Place 1 tablet (0.4 mg total) under the tongue every 5 (five) minutes as needed for chest pain., Disp: 25 tablet, Rfl: 6   rosuvastatin  (CRESTOR ) 40 MG tablet, Take 1 tablet (40 mg total) by mouth daily., Disp: 90 tablet, Rfl: 3   tirzepatide  (MOUNJARO ) 10 MG/0.5ML Pen, Inject 10 mg into the skin once a week., Disp: 2 mL, Rfl: 0   Vitamin D , Ergocalciferol , (DRISDOL ) 1.25 MG (50000 UNIT) CAPS capsule, Take 1 capsule (50,000 Units total) by mouth every 7 (seven) days., Disp: 12 capsule, Rfl: 3  No Known Allergies   Review of Systems  Constitutional:  Negative for chills, fever, malaise/fatigue and weight loss.  HENT:  Negative for congestion, ear pain, hearing loss, sore throat and tinnitus.    Eyes:  Negative for blurred vision, pain and redness.  Respiratory:  Negative for cough, hemoptysis and shortness of breath.   Cardiovascular:  Negative for chest pain, palpitations, orthopnea, claudication and leg swelling.  Gastrointestinal:  Negative for abdominal pain, blood in stool, constipation, diarrhea, nausea and vomiting.  Genitourinary:  Negative for dysuria, flank pain, frequency, hematuria and urgency.  Musculoskeletal:  Negative for falls, joint pain and myalgias.  Skin:  Negative for itching and rash.  Neurological:  Negative for dizziness, tingling, speech change, weakness and headaches.  Endo/Heme/Allergies:  Negative for polydipsia. Does not bruise/bleed easily.  Psychiatric/Behavioral:  Negative for depression and memory loss. The patient is not nervous/anxious and does not have insomnia.        Objective:  BP 122/74 (BP Location: Left Arm, Patient Position: Sitting)   Pulse 100   Ht 5' 9 (1.753 m)   Wt 208 lb (94.3 kg)   SpO2 99%   BMI 30.72 kg/m   Wt Readings from Last 3 Encounters:  10/15/23 208 lb (94.3 kg)  02/05/23 215 lb 3.2 oz (97.6 kg)  10/14/22 219 lb 12.8 oz (99.7 kg)    General appearance: alert, no distress, WD/WN, African American female Skin: no worrisome lesions HEENT unremarkable Neck: supple, no lymphadenopathy, no thyromegaly, no masses, normal ROM, no bruits Chest: non tender, normal shape and expansion Heart: RRR, normal S1, S2, no murmurs Lungs: CTA bilaterally, no wheezes, rhonchi, or rales Abdomen: +bs, soft, non tender, non distended, no masses, no hepatomegaly, no splenomegaly, no bruits Back: non tender, normal ROM, no scoliosis Musculoskeletal:  upper extremities non tender, no obvious deformity, normal ROM throughout, lower extremities non tender, no obvious deformity, normal ROM throughout Extremities: varicose veins right lateral leg, scattered lower leg spider veins, no edema, no cyanosis, no clubbing Pulses: 1+ right  radial pulse, otherwise 2+ symmetric, upper and lower extremities, normal cap refill Neurological: alert, oriented x 3, CN2-12 intact, strength normal upper extremities and lower extremities, sensation normal throughout, DTRs 2+ throughout, no cerebellar signs, gait  normal Psychiatric: normal affect, behavior normal, pleasant  Breast: nontender, there is some fibrocystic nodules of bilateral breast right side adjacent to the areola bilaterally, no masses or lumps, no skin changes, no nipple discharge or inversion, no axillary lymphadenopathy, exam chaperoned by nurse Gyn/rectal - deferred   Diabetic Foot Exam - Simple   Simple Foot Form Diabetic Foot exam was performed with the following findings: Yes 10/15/2023 11:00 AM  Visual Inspection See comments: Yes Sensation Testing Intact to touch and monofilament testing bilaterally: Yes Pulse Check See comments: Yes Comments 1+ pedal pulses, corn left volar foot over 4th toe MTP, hammer toes bilat with callus of left 4th and 5th toes at PIP      Assessment and Plan :   Encounter Diagnoses  Name Primary?   Encounter for health maintenance examination in adult Yes   Essential hypertension    Other hyperlipidemia    Vitamin D  deficiency    Type II diabetes mellitus with complication (HCC)    Fibrocystic breast disease (FCBD), unspecified laterality     Physical exam - discussed and counseled on healthy lifestyle, diet, exercise, preventative care, vaccinations, sick and well care, proper use of emergency dept and after hours care, and addressed their concerns.    Health screening: Advised they see their eye doctor yearly for routine vision care. Advised they see their dentist yearly for routine dental care including hygiene visits twice yearly.  Vaccines: I recommend yearly flu shot soon in the fall I recommend Prevnar 20, Tdap, covid vaccine  She declines vaccines   Cancer screening: We will order updated diagnostic mammogram  given prior abnormal findings last year  Referral for first colonoscopy  Check skin regularly for new or changing moles or macules.  Discussed routine labs    Separate significant chronic issues discussed: Diabetes Exercise regularly Check feet daily for sores or wounds and let us  know if something is not healing appropriately Continue mounjaro  10mg  weekly  Hyperlipidemia-continue cholesterol medication crestor  40mg  daily and Aspirin  81mg  daily  High blood pressure-continue blood pressure medication metoprolol  25mg  BID, losartan  50mg  daily  Vitamin D  deficiency-continue vitamin D  supplement  Decreased pulses in feet-ABI reviewed from 07/2021  Jennifer Cervantes was seen today for annual exam.  Diagnoses and all orders for this visit:  Encounter for health maintenance examination in adult -     CBC -     Comprehensive metabolic panel with GFR -     Lipid panel -     VITAMIN D  25 Hydroxy (Vit-D Deficiency, Fractures) -     Hemoglobin A1c -     Microalbumin/Creatinine Ratio, Urine  Essential hypertension  Other hyperlipidemia -     Lipid panel  Vitamin D  deficiency -     VITAMIN D  25 Hydroxy (Vit-D Deficiency, Fractures)  Type II diabetes mellitus with complication (HCC) -     Hemoglobin A1c -     Microalbumin/Creatinine Ratio, Urine  Fibrocystic breast disease (FCBD), unspecified laterality -     MM 3D DIAGNOSTIC MAMMOGRAM BILATERAL BREAST   Follow-up pending labs, every 4 to 6 months for diabetes, yearly for physical

## 2023-10-15 NOTE — Progress Notes (Signed)
 Done

## 2023-10-15 NOTE — Patient Instructions (Signed)
 Restaurant suggestions:  [employer] in [place] for [place] [employer] in [place] for good fish and homemade fries If you go to [facility], [place] has crabs on the happy hour menu [employer] in [facility] [place] also has good crabs  Try the bar or restaurant at [employer] for a good view and food  Software [employer] for shopping in [place] and [address]

## 2023-10-16 ENCOUNTER — Ambulatory Visit: Payer: Self-pay | Admitting: Medical

## 2023-10-16 ENCOUNTER — Encounter: Payer: Self-pay | Admitting: Medical

## 2023-10-16 NOTE — Progress Notes (Signed)
 Results through MyChart

## 2023-10-19 LAB — COMPREHENSIVE METABOLIC PANEL WITH GFR
ALT: 14 IU/L (ref 0–32)
AST: 21 IU/L (ref 0–40)
Albumin: 5 g/dL — ABNORMAL HIGH (ref 3.9–4.9)
Alkaline Phosphatase: 110 IU/L (ref 44–121)
BUN/Creatinine Ratio: 12 (ref 9–23)
BUN: 14 mg/dL (ref 6–24)
Bilirubin Total: 0.6 mg/dL (ref 0.0–1.2)
CO2: 17 mmol/L — ABNORMAL LOW (ref 20–29)
Calcium: 10.3 mg/dL — ABNORMAL HIGH (ref 8.7–10.2)
Chloride: 106 mmol/L (ref 96–106)
Creatinine, Ser: 1.17 mg/dL — ABNORMAL HIGH (ref 0.57–1.00)
Globulin, Total: 3.2 g/dL (ref 1.5–4.5)
Glucose: 105 mg/dL — ABNORMAL HIGH (ref 70–99)
Potassium: 4.2 mmol/L (ref 3.5–5.2)
Sodium: 140 mmol/L (ref 134–144)
Total Protein: 8.2 g/dL (ref 6.0–8.5)
eGFR: 59 mL/min/1.73 — ABNORMAL LOW (ref >59)

## 2023-10-19 LAB — LIPID PANEL
Chol/HDL Ratio: 3.9 ratio (ref 0.0–4.4)
Cholesterol, Total: 178 mg/dL (ref 100–199)
HDL: 46 mg/dL (ref >39)
LDL Chol Calc (NIH): 118 mg/dL — ABNORMAL HIGH (ref 0–99)
Triglycerides: 77 mg/dL (ref 0–149)
VLDL Cholesterol Cal: 14 mg/dL (ref 5–40)

## 2023-10-19 LAB — CBC
Hematocrit: 46.6 % (ref 34.0–46.6)
Hemoglobin: 15 g/dL (ref 11.1–15.9)
MCH: 31.8 pg (ref 26.6–33.0)
MCHC: 32.2 g/dL (ref 31.5–35.7)
MCV: 99 fL — ABNORMAL HIGH (ref 79–97)
Platelets: 244 x10E3/uL (ref 150–450)
RBC: 4.72 x10E6/uL (ref 3.77–5.28)
RDW: 13 % (ref 11.7–15.4)
WBC: 9 x10E3/uL (ref 3.4–10.8)

## 2023-10-19 LAB — HEMOGLOBIN A1C
Est. average glucose Bld gHb Est-mCnc: 131 mg/dL
Hgb A1c MFr Bld: 6.2 % — ABNORMAL HIGH (ref 4.8–5.6)

## 2023-10-19 LAB — VITAMIN D 25 HYDROXY (VIT D DEFICIENCY, FRACTURES): Vit D, 25-Hydroxy: 39.2 ng/mL (ref 30.0–100.0)

## 2023-10-19 LAB — MICROALBUMIN / CREATININE URINE RATIO
Creatinine, Urine: 328.7 mg/dL
Microalb/Creat Ratio: 51 mg/g{creat} — ABNORMAL HIGH (ref 0–29)
Microalbumin, Urine: 167.5 ug/mL

## 2023-10-20 NOTE — Progress Notes (Signed)
 Regarding your recent labs....  Your cholesterol is too high so I recommend you take your cholesterol medicine every day  Your diabetes marker is okay so continue the Mounjaro  as usual  The CO2 is nothing to worry about  Liver tests are fine.  What is not okay is the kidney marker and your microalbumin.  So if you are not taking your losartan  blood pressure pill every single day then you need to do this to protect the kidney.  You also need to drink at least 80 to 100 ounces of water daily and avoid anti-inflammatories like ibuprofen and Motrin.  If you are in fact taking your losartan  every day then the next recommendation would be to add a secondary medicine for kidney protection such as either Jardiance  or a medicine called Leonore to prevent progression of kidney disease.  So let me know if you are actually taking losartan  every day or not.  Looking back over the labs, lets lower your vitamin D  dose as this may be the cause of the elevated calcium .  If you already picked up the vitamin D  refill on the 50,000 weekly dose then cut it in half and do a half dose every week.  Check with the pharmacist as I am not completely sure you can cut this in half.  If not I will send a different dose to the pharmacy such as 1000 units daily.  Let me know  We we will need to recheck labs for calcium  again in the next month or so

## 2023-10-27 NOTE — Progress Notes (Deleted)
 CARDIOLOGY CONSULT NOTE       Patient ID: Jennifer Cervantes MRN: [id number] DOB/AGE: [birth year] 45 y.o.  Referring Physician: [name] Primary Physician: Jennifer Cervantes Primary Cardiologist: Jennifer Cervantes Reason for Consultation: CAD  Active Problems:   * No active hospital problems. *   HPI:  43 y.o. last seen in 2023 Previously seen by Jennifer Cervantes. 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 To prevent apical thrombus  Now seems to be d/c. Started on Mounjaro  for weight loss/DM  09/21/23  A1c much better at 6.2  Cr up at 1.17 with microalbumin elevation. On ARB for renal protection ? Need for [place]. LDL 118 10/15/23 told to take crestor  40 mg daily   ***    ROS All other systems reviewed and negative except as noted above  Past Medical History:  Diagnosis Date  . Anemia   . CAD (coronary artery disease)    a. LHC  on 06/21/15 which showed 75% occl mid-dist LCx, 40% occl mRCA, 99% mid-dist LAD s/p DES and 50% occl mLAD s/p DES (overlapping stents) and significant LV dysfunction with anteroapical HK; b. 06/2015 Myoview : EF 55-65%, no ischemia/infarct.  . CHF (congestive heart failure) (HCC)   . Complication of anesthesia    adverse reaction to anesthesia-wakes up very confused, agitated, getting out of bed with eyes closed  . GERD (gastroesophageal reflux disease)   . Hyperlipidemia   . Hypertension   . Hypothyroidism   . Myocardial stunning    a. EF 45% on 2D ECHO on 06/21/15 and severe LV dysfunction on LV gram by cath. Repeat 2D ECHO with EF 55-60% and mild HK of the apicoseptal myocardium.  Jennifer Cervantes Neuropraxia of right median nerve    a. suspected after right radial cath. will follow with neuro outpatient if does not resolve.   . NSTEMI (non-ST elevated myocardial infarction) (HCC) 05/2015   [place]  . Obesity   . Right radial artery thrombus (HCC)    a. 05/2015 Following PCI (radial access)-->conservatively managed.  . Tobacco abuse   . Tricuspid regurgitation   . Type II diabetes mellitus (HCC)    a. Type II  . Vitamin D  deficiency     Family History  Problem Relation Age of Onset  . Diabetes Mother   . Healthy Father   . Healthy Sister   .  Healthy Brother   . Diabetes Maternal Grandmother   . Heart disease Maternal Grandmother   . Diabetes Maternal Aunt        x 2  . Heart attack Neg Hx   . Heart failure Neg Hx   . Stroke Neg Hx   . Hyperlipidemia Neg Hx   . Hypertension Neg Hx   . Stomach cancer Neg Hx   . Colon cancer Neg Hx     Social History   Socioeconomic History  . Marital status: Single    Spouse name: Not on file  . Number of children: 4  . Years of education: 11  . Highest education level: Not on file  Occupational History  . Occupation: Scientist, research (medical): [employer]  Tobacco Use  . Smoking status: Former    Current packs/day: 0.00    Average packs/day: 0.5  packs/day for 23.0 years (11.5 ttl pk-yrs)    Types: Cigarettes    Start date: 12/19/1997    Quit date: 12/19/2020    Years since quitting: 2.8  . Smokeless tobacco: Never  Vaping Use  . Vaping status: Never Used  Substance and Sexual Activity  . Alcohol use: No    Alcohol/week: 0.0 standard drinks of alcohol  . Drug use: No  . Sexual activity: Not on file  Other Topics Concern  . Not on file  Social History Narrative   Lives in [place], works nights at [employer], [place]   Right-handed   Drinks 1 soda a day   Exercises some with walking   Lives with her son   Yvonne her space, doesn't want a significant other   05/2019   Social Drivers of Health   Financial Resource Strain: Not on file  Food Insecurity: No Food Insecurity (10/14/2022)   Hunger Vital Sign   . Worried About Programme researcher, broadcasting/film/video in the Last Year: Never true   . Ran Out of Food in the Last Year: Never true  Transportation Needs: No Transportation Needs (10/14/2022)   PRAPARE - Transportation   . Lack of Transportation (Medical): No   . Lack of Transportation (Non-Medical): No  Physical Activity: Not on file  Stress: No Stress Concern Present (05/04/2019)   Received from [facility] Occupational Health - Occupational Stress Questionnaire   . Feeling of Stress : Not at all  Social Connections: Not on file  Intimate Partner Violence: Not At Risk (10/14/2022)   Humiliation, Afraid, Rape, and Kick questionnaire   . Fear of Current or Ex-Partner: No   . Emotionally Abused: No   . Physically Abused: No   . Sexually Abused: No    Past Surgical History:  Procedure Laterality Date  . 24 HOUR PH STUDY N/A 07/14/2016   Procedure: 24 HOUR PH STUDY;  Surgeon: Jennifer Cervantes GAILS, MD;  Location: [facility];  Service: Endoscopy;  Laterality: N/A;  . BREAST LUMPECTOMY WITH RADIOACTIVE SEED LOCALIZATION Right 01/09/2021   Procedure: RIGHT BREAST LUMPECTOMY WITH RADIOACTIVE SEED LOCALIZATION;  Surgeon:  Jennifer Deward MOULD, MD;  Location: [facility];  Service: General;  Laterality: Right;  . CARDIAC CATHETERIZATION N/A 06/21/2015   Procedure: Left Heart Cath and Coronary Angiography;  Surgeon: Victory LELON Sharps, MD; pLAD 99%, CFX 75%, RCA 40%, EF 35%   . CARDIAC CATHETERIZATION N/A 06/21/2015   Procedure: Coronary Stent Intervention;  Surgeon: Victory LELON Sharps, MD;  Promus Premier DES, 3.0 x 20 and 3.5 x 32 mm stents  to the LAD  . CARDIAC CATHETERIZATION N/A 06/21/2015   Procedure: Intravascular Ultrasound/IVUS;  Surgeon: Victory LELON Sharps, MD;  Location: [facility] INVASIVE CV LAB;  Service: Cardiovascular;  Laterality: N/A;  LAD  . CARDIAC CATHETERIZATION  06/22/2015   Procedure: Coronary/Graft Angiography;  Surgeon: Chapman Matteucci M [place], MD;  Location: [facility] INVASIVE CV LAB;  Service: Cardiovascular;;  . CORONARY STENT INTERVENTION N/A 04/02/2016   Procedure: Coronary Stent Intervention;  Surgeon: Candyce GORMAN Reek, MD;  Location: [facility] INVASIVE CV LAB;  Service: Cardiovascular;  Laterality: N/A;  . ESOPHAGEAL MANOMETRY N/A 07/14/2016   Procedure: ESOPHAGEAL MANOMETRY (EM);  Surgeon: Jennifer Cervantes GAILS, MD;  Location: [facility];  Service: Endoscopy;  Laterality: N/A;  . LEFT HEART CATH AND CORONARY ANGIOGRAPHY N/A 04/02/2016   Procedure: Left Heart Cath and Coronary Angiography;  Surgeon: Candyce GORMAN Reek, MD;  Location: [facility] INVASIVE CV LAB;  Service: Cardiovascular;  Laterality: N/A;  . LEFT HEART CATH AND CORONARY ANGIOGRAPHY N/A 07/17/2016   Procedure: Left Heart Cath and Coronary Angiography;  Surgeon: Anner Alm LELON, MD;  Location: [facility] INVASIVE CV LAB;  Service: Cardiovascular;  Laterality: N/A;      Current Outpatient Medications:  .  aspirin  EC (ASPIRIN  LOW DOSE) 81 MG tablet, Take 1 tablet (81 mg total) by mouth daily., Disp: 90 tablet, Rfl: 3 .  Continuous Glucose Sensor (DEXCOM G6 SENSOR) MISC, Apply 1 sensor every 10 days., Disp: 4 each, Rfl: 11 .  Continuous Glucose Transmitter (DEXCOM G6  TRANSMITTER) MISC, use 1 transmitter every 90 days, Disp: 4 each, Rfl: 0 .  losartan  (COZAAR ) 50 MG tablet, Take 1 tablet (50 mg total) by mouth daily., Disp: 90 tablet, Rfl: 3 .  metoprolol  tartrate (LOPRESSOR ) 25 MG tablet, Take 1 tablet (25 mg total) by mouth 2 (two) times daily., Disp: 180 tablet, Rfl: 3 .  nitroGLYCERIN  (NITROSTAT ) 0.4 MG SL tablet, Place 1 tablet (0.4 mg total) under the tongue every 5 (five) minutes as needed for chest pain., Disp: 25 tablet, Rfl: 6 .  rosuvastatin  (CRESTOR ) 40 MG tablet, Take 1 tablet (40 mg total) by mouth daily., Disp: 90 tablet, Rfl: 3 .  tirzepatide  (MOUNJARO ) 10 MG/0.5ML Pen, Inject 10 mg into the skin once a week., Disp: 2 mL, Rfl: 0 .  Vitamin D , Ergocalciferol , (DRISDOL ) 1.25 MG (50000 UNIT) CAPS capsule, Take 1 capsule (50,000 Units total) by mouth every 7 (seven) days., Disp: 12 capsule, Rfl: 3    Physical Exam: There were no vitals taken for this visit.    Affect appropriate Chronically ill black female  HEENT: normal Neck supple with no adenopathy JVP normal no bruits no thyromegaly Lungs clear with no wheezing and good diaphragmatic motion Heart:  S1/S2 no murmur, no rub, gallop or click PMI normal Abdomen: benighn, BS positve, no tenderness, no AAA no bruit.  No HSM or HJR Absent right radial pulse good ulnar with normal palmer perfusion  No edema Neuro decreased sensation right forearm/hand chronic  Skin warm and dry No muscular weakness   Labs:   Lab Results  Component Value Date   WBC 9.0 10/15/2023   HGB 15.0 10/15/2023   HCT 46.6 10/15/2023   MCV 99 (H) 10/15/2023   PLT 244 10/15/2023   No results for input(s): NA, K, CL, CO2, BUN, CREATININE, CALCIUM , PROT, BILITOT, ALKPHOS, ALT, AST, GLUCOSE in the last 168 hours.  Invalid input(s): LABALBU Lab Results  Component Value Date   TROPONINI <0.03 05/25/2017    Lab Results  Component Value Date  CHOL 178 10/15/2023   CHOL 115  10/14/2022   CHOL 142 08/05/2021   Lab Results  Component Value Date   HDL 46 10/15/2023   HDL 45 10/14/2022   HDL 47 08/05/2021   Lab Results  Component Value Date   LDLCALC 118 (H) 10/15/2023   LDLCALC 58 10/14/2022   LDLCALC 79 08/05/2021   Lab Results  Component Value Date   TRIG 77 10/15/2023   TRIG 53 10/14/2022   TRIG 82 08/05/2021   Lab Results  Component Value Date   CHOLHDL 3.9 10/15/2023   CHOLHDL 2.6 10/14/2022   CHOLHDL 3.0 08/05/2021   No results found for: LDLDIRECT    Radiology: No results found.  EKG: SR rate 89 normal 06/07/19    ASSESSMENT AND PLAN:   1. CAD: post multiple stents to LAD most recently 07/17/16 with normal myovue no ischemia 06/16/19 continue medical Rx Avoid right arm for future catheterizations No need for low dose xarelto  can dc  2. Right Arm Pain: chronic perfused by ulnar f/u [facility]    3. HTN:  Well controlled.  Continue current medications and low sodium Dash type diet.    4. HLD on statin labs with primary   5. DM:  Discussed low carb diet.  Target hemoglobin A1c is 6.5 or less.  Continue current medicationsA1c improved on Mounjaro    6. CRF:  Cr 1.17 con ARB consider [place]/Jardiance  per primary  ***   F/U in a year    Signed: Maude Emmer 10/27/2023, 2:09 PM

## 2023-11-05 ENCOUNTER — Ambulatory Visit: Admitting: Cardiovascular Disease

## 2023-11-09 ENCOUNTER — Other Ambulatory Visit: Payer: Self-pay | Admitting: Medical

## 2023-11-10 ENCOUNTER — Other Ambulatory Visit: Payer: Self-pay

## 2023-11-10 ENCOUNTER — Other Ambulatory Visit (HOSPITAL_COMMUNITY): Payer: Self-pay

## 2023-11-10 MED ORDER — MOUNJARO 10 MG/0.5ML ~~LOC~~ SOAJ
10.0000 mg | SUBCUTANEOUS | 2 refills | Status: DC
  Filled 2023-11-10: qty 2, 28d supply, fill #0
  Filled 2023-12-07: qty 2, 28d supply, fill #1
  Filled 2024-01-16: qty 2, 28d supply, fill #2

## 2023-11-10 NOTE — Telephone Encounter (Signed)
 Ok to refill or does the dose  need to increase

## 2023-11-12 ENCOUNTER — Ambulatory Visit
Admission: RE | Admit: 2023-11-12 | Discharge: 2023-11-12 | Disposition: A | Discharge status: Home / Self Care | Source: Ambulatory Visit | Attending: Medical | Admitting: Medical

## 2023-11-12 DIAGNOSIS — R928 Other abnormal and inconclusive findings on diagnostic imaging of breast: Secondary | ICD-10-CM | POA: Diagnosis not present

## 2023-11-12 DIAGNOSIS — R92323 Mammographic fibroglandular density, bilateral breasts: Secondary | ICD-10-CM | POA: Diagnosis not present

## 2023-11-12 NOTE — Progress Notes (Signed)
 Results through MyChart

## 2023-12-07 ENCOUNTER — Other Ambulatory Visit (HOSPITAL_COMMUNITY): Payer: Self-pay

## 2023-12-07 ENCOUNTER — Other Ambulatory Visit: Payer: Self-pay

## 2023-12-07 ENCOUNTER — Other Ambulatory Visit: Payer: Self-pay | Admitting: Medical

## 2023-12-07 MED ORDER — ROSUVASTATIN CALCIUM 40 MG PO TABS
40.0000 mg | ORAL_TABLET | Freq: Every day | ORAL | 3 refills | Status: AC
  Filled 2023-12-07: qty 90, 90d supply, fill #0

## 2024-01-03 NOTE — Progress Notes (Unsigned)
 CARDIOLOGY CONSULT NOTE       Patient ID: TUNYA HELD MRN: [id number] DOB/AGE: [birth year] 45 y.o.  Referring Physician: [name] Primary Physician: Bulah Alm RAMAN, PA-C Primary Cardiologist: [name] Reason for Consultation: CAD   HPI:  43 y.o. last seen by me 08/28/21 . 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 EF preserved 55-65%  Myovue 06/16/19 normal with no ischemia EF 68% She has had right radial occlusion after multiple caths and CTA 10/07/17 showed dominant ulnar run off in right hand She was started back on beta blocker in April 2023 She has seen [facility] [name] and had multiple nerve condution studies and MRI in regard to right arm pain/swelling   Has 3 older kids living on own.  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 LHC on 06/21/15 which showed 75% occl mid-dist LCx, 40% occl mRCA, 99% mid-dist LAD s/p DES and 50% occl mLAD s/p DES (overlapping stents) and significant LV dysfunction with anteroapical HK; b. 06/2015 Myoview : EF 55-65%, no ischemia/infarct.   CHF (congestive heart failure) (HCC)    Complication of anesthesia    adverse reaction to anesthesia-wakes up very confused, agitated, getting out of bed with eyes closed   GERD (gastroesophageal reflux disease)    Hyperlipidemia    Hypertension    Hypothyroidism    Myocardial  stunning    a. EF 45% on 2D ECHO on 06/21/15 and severe LV dysfunction on LV gram by cath. Repeat 2D ECHO with EF 55-60% and mild HK of the apicoseptal myocardium.   Neuropraxia of right median nerve    a. suspected after right radial cath. will follow with neuro outpatient if does not resolve.    NSTEMI (non-ST elevated myocardial infarction) (HCC) 05/2015   [employer]   Obesity    Right radial artery thrombus (HCC)    a. 05/2015 Following PCI (radial access)-->conservatively managed.   Tobacco abuse    Tricuspid regurgitation    Type II diabetes mellitus (HCC)    a. Type II   Vitamin D  deficiency     Family History  Problem Relation Age of Onset   Diabetes Mother    Cancer Mother    Early death Father    Healthy Sister    Healthy Brother    Diabetes Maternal Grandmother    Heart disease Maternal Grandmother    Diabetes Maternal Aunt        x 2   Asthma Son    Heart attack Neg Hx    Heart failure Neg Hx    Stroke Neg Hx    Hyperlipidemia Neg Hx    Hypertension Neg Hx    Stomach cancer Neg Hx    Colon cancer Neg Hx     Social History   Socioeconomic History   Marital status: Single  Spouse name: Not on file   Number of children: 4   Years of education: 5   Highest education level: Not on file  Occupational History   Occupation: CNA    Employer: [employer]  Tobacco Use   Smoking status: Former    Current packs/day: 0.00    Average packs/day: 0.5 packs/day for 23.0 years (11.5 ttl pk-yrs)    Types: Cigarettes    Start date: 12/19/1997    Quit date: 12/19/2020    Years since quitting: 3.0   Smokeless tobacco: Never  Vaping Use   Vaping status: Never Used  Substance and Sexual Activity   Alcohol use: No    Alcohol/week: 0.0 standard drinks of alcohol   Drug use: No   Sexual activity: Not on file  Other Topics Concern   Not on file  Social History Narrative   Lives in [place], works nights at [employer], [place]   Right-handed   Drinks 1 soda a day    Exercises some with walking   Lives with her son   Yvonne her space, doesn't want a significant other   05/2019   Social Drivers of Health   Financial Resource Strain: Not on file  Food Insecurity: No Food Insecurity (10/14/2022)   Hunger Vital Sign    Worried About Running Out of Food in the Last Year: Never true    Ran Out of Food in the Last Year: Never true  Transportation Needs: No Transportation Needs (10/14/2022)   PRAPARE - Administrator, Civil Service (Medical): No    Lack of Transportation (Non-Medical): No  Physical Activity: Not on file  Stress: No Stress Concern Present (05/04/2019)   Received from [facility] of Occupational Health - Occupational Stress Questionnaire    Feeling of Stress : Not at all  Social Connections: Not on file  Intimate Partner Violence: Not At Risk (10/14/2022)   Humiliation, Afraid, Rape, and Kick questionnaire    Fear of Current or Ex-Partner: No    Emotionally Abused: No    Physically Abused: No    Sexually Abused: No    Past Surgical History:  Procedure Laterality Date   47 HOUR PH STUDY N/A 07/14/2016   Procedure: 24 HOUR PH STUDY;  Surgeon: Shila Gustav GAILS, MD;  Location: [facility];  Service: Endoscopy;  Laterality: N/A;   BREAST LUMPECTOMY WITH RADIOACTIVE SEED LOCALIZATION Right 01/09/2021   Procedure: RIGHT BREAST LUMPECTOMY WITH RADIOACTIVE SEED LOCALIZATION;  Surgeon: Curvin Deward MOULD, MD;  Location: [facility];  Service: General;  Laterality: Right;   CARDIAC CATHETERIZATION N/A 06/21/2015   Procedure: Left Heart Cath and Coronary Angiography;  Surgeon: Victory LELON Sharps, MD; pLAD 99%, CFX 75%, RCA 40%, EF 35%    CARDIAC CATHETERIZATION N/A 06/21/2015   Procedure: Coronary Stent Intervention;  Surgeon: Victory LELON Sharps, MD;  Promus Premier DES, 3.0 x 20 and 3.5 x 32 mm stents to the LAD   CARDIAC CATHETERIZATION N/A 06/21/2015   Procedure: Intravascular Ultrasound/IVUS;  Surgeon: Victory LELON Sharps, MD;  Location[facility] INVASIVE CV LAB;  Service: Cardiovascular;  Laterality: N/A;  LAD   CARDIAC CATHETERIZATION  06/22/2015   Procedure: Coronary/Graft Angiography;  Surgeon: Ashwika Freels M Jordan, MD;  Location: [facility] INVASIVE CV LAB;  Service: Cardiovascular;;   CORONARY STENT INTERVENTION N/A 04/02/2016   Procedure: Coronary Stent Intervention;  Surgeon: Candyce GORMAN Reek, MD;  Location: [facility] INVASIVE CV LAB;  Service: Cardiovascular;  Laterality: N/A;  ESOPHAGEAL MANOMETRY N/A 07/14/2016   Procedure: ESOPHAGEAL MANOMETRY (EM);  Surgeon: Shila Gustav GAILS, MD;  Location: [facility];  Service: Endoscopy;  Laterality: N/A;   LEFT HEART CATH AND CORONARY ANGIOGRAPHY N/A 04/02/2016   Procedure: Left Heart Cath and Coronary Angiography;  Surgeon: Candyce GORMAN Reek, MD;  Location: [facility] INVASIVE CV LAB;  Service: Cardiovascular;  Laterality: N/A;   LEFT HEART CATH AND CORONARY ANGIOGRAPHY N/A 07/17/2016   Procedure: Left Heart Cath and Coronary Angiography;  Surgeon: Anner Alm ORN, MD;  Location: [facility] INVASIVE CV LAB;  Service: Cardiovascular;  Laterality: N/A;   TUBAL LIGATION  08/2002      Current Outpatient Medications:    aspirin  EC (ASPIRIN  LOW DOSE) 81 MG tablet, Take 1 tablet (81 mg total) by mouth daily., Disp: 90 tablet, Rfl: 3   Continuous Glucose Sensor (DEXCOM G6 SENSOR) MISC, Apply 1 sensor every 10 days., Disp: 4 each, Rfl: 11   Continuous Glucose Transmitter (DEXCOM G6 TRANSMITTER) MISC, use 1 transmitter every 90 days, Disp: 4 each, Rfl: 0   losartan  (COZAAR ) 50 MG tablet, Take 1 tablet (50 mg total) by mouth daily., Disp: 90 tablet, Rfl: 3   metoprolol  tartrate (LOPRESSOR ) 25 MG tablet, Take 1 tablet (25 mg total) by mouth 2 (two) times daily., Disp: 180 tablet, Rfl: 3   nitroGLYCERIN  (NITROSTAT ) 0.4 MG SL tablet, Place 1 tablet (0.4 mg total) under the tongue every 5 (five) minutes as needed for chest pain., Disp: 25 tablet, Rfl: 6   rosuvastatin  (CRESTOR ) 40 MG tablet, Take 1  tablet (40 mg total) by mouth daily., Disp: 90 tablet, Rfl: 3   tirzepatide  (MOUNJARO ) 10 MG/0.5ML Pen, Inject 10 mg into the skin once a week., Disp: 2 mL, Rfl: 2   Vitamin D , Ergocalciferol , (DRISDOL ) 1.25 MG (50000 UNIT) CAPS capsule, Take 1 capsule (50,000 Units total) by mouth every 7 (seven) days., Disp: 12 capsule, Rfl: 3    Physical Exam: Blood pressure 118/84, pulse 89, height 5' 9 (1.753 m), weight 209 lb (94.8 kg), SpO2 100%.    Affect appropriate Chronically ill black female  HEENT: normal Neck supple with no adenopathy JVP normal no bruits no thyromegaly Lungs clear with no wheezing and good diaphragmatic motion Heart:  S1/S2 no murmur, no rub, gallop or click PMI normal Abdomen: benighn, BS positve, no tenderness, no AAA no bruit.  No HSM or HJR Absent right radial pulse good ulnar with normal palmer perfusion  No edema Neuro decreased sensation right forearm/hand chronic  Skin warm and dry No muscular weakness   Labs:   Lab Results  Component Value Date   WBC 9.0 10/15/2023   HGB 15.0 10/15/2023   HCT 46.6 10/15/2023   MCV 99 (H) 10/15/2023   PLT 244 10/15/2023   No results for input(s): NA, K, CL, CO2, BUN, CREATININE, CALCIUM , PROT, BILITOT, ALKPHOS, ALT, AST, GLUCOSE in the last 168 hours.  Invalid input(s): LABALBU Lab Results  Component Value Date   TROPONINI <0.03 05/25/2017    Lab Results  Component Value Date   CHOL 178 10/15/2023   CHOL 115 10/14/2022   CHOL 142 08/05/2021   Lab Results  Component Value Date   HDL 46 10/15/2023   HDL 45 10/14/2022   HDL 47 08/05/2021   Lab Results  Component Value Date   LDLCALC 118 (H) 10/15/2023   LDLCALC 58 10/14/2022   LDLCALC 79 08/05/2021   Lab Results  Component Value Date   TRIG 77 10/15/2023   TRIG 53  10/14/2022   TRIG 82 08/05/2021   Lab Results  Component Value Date   CHOLHDL 3.9 10/15/2023   CHOLHDL 2.6 10/14/2022   CHOLHDL 3.0 08/05/2021   No  results found for: LDLDIRECT    Radiology: No results found.  EKG: SR rate 89 normal 06/07/19    ASSESSMENT AND PLAN:   1. CAD: post multiple stents to LAD most recently 07/17/16 with normal myovue no ischemia 06/16/19 continue medical Rx Avoid right arm for future catheterizations EF on nuclear study normalized at 68%  Change lopressor  to Toprol  50 mg daily for better compliance   2. Right Arm Pain: chronic perfused by ulnar f/u [facility]   3. HTN:  Well controlled.  Continue current medications and low sodium Dash type diet.    4. HLD on statin labs with primary LDL 118 Crestor  increased to 40 mg daily  5. DM:  Discussed low carb diet.  Target hemoglobin A1c is 6.5 or less.  Continue current medications.Much better control On Mounjaro  now   Toprol  50 mg D/C lopressor  25 mg bid  F/U in a year    Signed: Maude Emmer 01/06/2024, 10:32 AM

## 2024-01-06 ENCOUNTER — Encounter: Payer: Self-pay | Admitting: Cardiovascular Disease

## 2024-01-06 ENCOUNTER — Other Ambulatory Visit (HOSPITAL_COMMUNITY): Payer: Self-pay

## 2024-01-06 ENCOUNTER — Ambulatory Visit: Attending: Cardiovascular Disease | Admitting: Cardiovascular Disease

## 2024-01-06 ENCOUNTER — Other Ambulatory Visit: Payer: Self-pay

## 2024-01-06 VITALS — BP 118/84 | HR 89 | Ht 69.0 in | Wt 209.0 lb

## 2024-01-06 DIAGNOSIS — I5032 Chronic diastolic (congestive) heart failure: Secondary | ICD-10-CM

## 2024-01-06 DIAGNOSIS — I252 Old myocardial infarction: Secondary | ICD-10-CM

## 2024-01-06 DIAGNOSIS — E782 Mixed hyperlipidemia: Secondary | ICD-10-CM | POA: Diagnosis not present

## 2024-01-06 MED ORDER — METOPROLOL SUCCINATE ER 50 MG PO TB24
50.0000 mg | ORAL_TABLET | Freq: Every day | ORAL | 3 refills | Status: AC
  Filled 2024-01-06: qty 90, 90d supply, fill #0

## 2024-01-06 NOTE — Patient Instructions (Signed)
 Medication Instructions:  Your physician has recommended you make the following change in your medication:   Stop Taking Metoprolol  Tartrate  Start Taking Toprol  XL 50 mg Daily   *If you need a refill on your cardiac medications before your next appointment, please call your pharmacy*  Lab Work: NONE   If you have labs (blood work) drawn today and your tests are completely normal, you will receive your results only by: MyChart Message (if you have MyChart) OR A paper copy in the mail If you have any lab test that is abnormal or we need to change your treatment, we will call you to review the results.  Testing/Procedures: NONE   Follow-Up: At [facility], you and your health needs are our priority.  As part of our continuing mission to provide you with exceptional heart care, our providers are all part of one team.  This team includes your primary Cardiologist (physician) and Advanced Practice Providers or APPs (Physician Assistants and Nurse Practitioners) who all work together to provide you with the care you need, when you need it.  Your next appointment:   1 year(s)  Provider:   Maude Emmer, MD    We recommend signing up for the patient portal called MyChart.  Sign up information is provided on this After Visit Summary.  MyChart is used to connect with patients for Virtual Visits (Telemedicine).  Patients are able to view lab/test results, encounter notes, upcoming appointments, etc.  Non-urgent messages can be sent to your provider as well.   To learn more about what you can do with MyChart, go to [email or web address].   Other Instructions Thank you for choosing [facility]!

## 2024-01-18 ENCOUNTER — Other Ambulatory Visit (HOSPITAL_COMMUNITY): Payer: Self-pay

## 2024-01-18 ENCOUNTER — Other Ambulatory Visit: Payer: Self-pay

## 2024-03-15 ENCOUNTER — Other Ambulatory Visit: Payer: Self-pay | Admitting: Medical

## 2024-03-15 ENCOUNTER — Other Ambulatory Visit: Payer: Self-pay

## 2024-03-15 MED ORDER — MOUNJARO 10 MG/0.5ML ~~LOC~~ SOAJ
10.0000 mg | SUBCUTANEOUS | 0 refills | Status: AC
  Filled 2024-03-15: qty 2, 28d supply, fill #0

## 2024-03-15 MED ORDER — ASPIRIN 81 MG PO TBEC
81.0000 mg | DELAYED_RELEASE_TABLET | Freq: Every day | ORAL | 0 refills | Status: AC
  Filled 2024-03-15: qty 90, 90d supply, fill #0

## 2024-03-15 NOTE — Telephone Encounter (Signed)
 Has upcoming appt in February

## 2024-03-31 ENCOUNTER — Encounter: Payer: Self-pay | Admitting: Medical
